# Patient Record
Sex: Female | Born: 1950
Health system: Southern US, Community
[De-identification: ages and names within clinical notes are randomized; demographics above are authoritative.]

## PROBLEM LIST (undated history)

## (undated) DIAGNOSIS — Z8659 Personal history of other mental and behavioral disorders: Secondary | ICD-10-CM

## (undated) DIAGNOSIS — F909 Attention-deficit hyperactivity disorder, unspecified type: Secondary | ICD-10-CM

## (undated) DIAGNOSIS — R7303 Prediabetes: Secondary | ICD-10-CM

## (undated) DIAGNOSIS — K219 Gastro-esophageal reflux disease without esophagitis: Secondary | ICD-10-CM

## (undated) DIAGNOSIS — N393 Stress incontinence (female) (male): Secondary | ICD-10-CM

## (undated) DIAGNOSIS — I10 Essential (primary) hypertension: Secondary | ICD-10-CM

## (undated) DIAGNOSIS — F419 Anxiety disorder, unspecified: Secondary | ICD-10-CM

## (undated) DIAGNOSIS — D649 Anemia, unspecified: Secondary | ICD-10-CM

## (undated) DIAGNOSIS — M1990 Unspecified osteoarthritis, unspecified site: Secondary | ICD-10-CM

## (undated) DIAGNOSIS — F329 Major depressive disorder, single episode, unspecified: Secondary | ICD-10-CM

## (undated) DIAGNOSIS — E890 Postprocedural hypothyroidism: Secondary | ICD-10-CM

## (undated) DIAGNOSIS — F32A Depression, unspecified: Secondary | ICD-10-CM

## (undated) DIAGNOSIS — Z973 Presence of spectacles and contact lenses: Secondary | ICD-10-CM

## (undated) DIAGNOSIS — E785 Hyperlipidemia, unspecified: Secondary | ICD-10-CM

## (undated) DIAGNOSIS — G4730 Sleep apnea, unspecified: Secondary | ICD-10-CM

## (undated) HISTORY — DX: Major depressive disorder, single episode, unspecified: F32.9

## (undated) HISTORY — DX: Essential (primary) hypertension: I10

## (undated) HISTORY — PX: ROTATOR CUFF REPAIR: SHX139

## (undated) HISTORY — DX: Attention-deficit hyperactivity disorder, unspecified type: F90.9

## (undated) HISTORY — PX: CATARACT EXTRACTION: SUR2

## (undated) HISTORY — PX: REPAIR SPIGELIAN HERNIA: SUR1208

## (undated) HISTORY — DX: Anxiety disorder, unspecified: F41.9

## (undated) HISTORY — PX: THYROIDECTOMY: SHX17

## (undated) HISTORY — DX: Depression, unspecified: F32.A

---

## 1973-08-16 HISTORY — PX: WRIST GANGLION EXCISION: SUR520

## 1999-09-24 ENCOUNTER — Encounter: Payer: Self-pay | Admitting: Family Medicine

## 1999-09-24 ENCOUNTER — Ambulatory Visit (HOSPITAL_COMMUNITY): Admission: RE | Admit: 1999-09-24 | Discharge: 1999-09-24 | Payer: Self-pay | Admitting: Family Medicine

## 1999-12-30 ENCOUNTER — Encounter: Admission: RE | Admit: 1999-12-30 | Discharge: 1999-12-30 | Payer: Self-pay | Admitting: Family Medicine

## 1999-12-30 ENCOUNTER — Encounter: Payer: Self-pay | Admitting: Family Medicine

## 2000-01-28 ENCOUNTER — Observation Stay (HOSPITAL_COMMUNITY): Admission: RE | Admit: 2000-01-28 | Discharge: 2000-01-29 | Payer: Self-pay | Admitting: General Surgery

## 2001-04-12 ENCOUNTER — Encounter: Admission: RE | Admit: 2001-04-12 | Discharge: 2001-04-12 | Payer: Self-pay | Admitting: Family Medicine

## 2001-04-12 ENCOUNTER — Encounter: Payer: Self-pay | Admitting: Family Medicine

## 2001-08-16 HISTORY — PX: VENTRAL HERNIA REPAIR: SHX424

## 2002-04-24 ENCOUNTER — Encounter: Payer: Self-pay | Admitting: Family Medicine

## 2002-04-24 ENCOUNTER — Encounter: Admission: RE | Admit: 2002-04-24 | Discharge: 2002-04-24 | Payer: Self-pay | Admitting: Family Medicine

## 2003-11-05 ENCOUNTER — Encounter: Admission: RE | Admit: 2003-11-05 | Discharge: 2003-11-05 | Payer: Self-pay | Admitting: Family Medicine

## 2004-11-09 ENCOUNTER — Encounter: Admission: RE | Admit: 2004-11-09 | Discharge: 2004-11-09 | Payer: Self-pay | Admitting: Family Medicine

## 2004-12-03 ENCOUNTER — Ambulatory Visit: Payer: Self-pay | Admitting: Cardiology

## 2004-12-15 ENCOUNTER — Ambulatory Visit: Payer: Self-pay | Admitting: Cardiology

## 2005-02-09 ENCOUNTER — Other Ambulatory Visit: Admission: RE | Admit: 2005-02-09 | Discharge: 2005-02-09 | Payer: Self-pay | Admitting: Family Medicine

## 2005-06-22 ENCOUNTER — Ambulatory Visit: Payer: Self-pay | Admitting: Sports Medicine

## 2005-06-29 ENCOUNTER — Ambulatory Visit: Payer: Self-pay | Admitting: Sports Medicine

## 2006-04-22 ENCOUNTER — Encounter: Admission: RE | Admit: 2006-04-22 | Discharge: 2006-04-22 | Payer: Self-pay | Admitting: Family Medicine

## 2006-05-31 ENCOUNTER — Other Ambulatory Visit: Admission: RE | Admit: 2006-05-31 | Discharge: 2006-05-31 | Payer: Self-pay | Admitting: Family Medicine

## 2006-10-21 ENCOUNTER — Ambulatory Visit (HOSPITAL_COMMUNITY): Admission: RE | Admit: 2006-10-21 | Discharge: 2006-10-21 | Payer: Self-pay | Admitting: Family Medicine

## 2007-04-20 ENCOUNTER — Inpatient Hospital Stay (HOSPITAL_COMMUNITY): Admission: RE | Admit: 2007-04-20 | Discharge: 2007-04-21 | Payer: Self-pay | Admitting: Orthopedic Surgery

## 2007-04-20 HISTORY — PX: OTHER SURGICAL HISTORY: SHX169

## 2008-09-13 ENCOUNTER — Encounter (INDEPENDENT_AMBULATORY_CARE_PROVIDER_SITE_OTHER): Payer: Self-pay | Admitting: Orthopedic Surgery

## 2008-09-13 ENCOUNTER — Ambulatory Visit (HOSPITAL_BASED_OUTPATIENT_CLINIC_OR_DEPARTMENT_OTHER): Admission: RE | Admit: 2008-09-13 | Discharge: 2008-09-13 | Payer: Self-pay | Admitting: Orthopedic Surgery

## 2008-09-13 HISTORY — PX: OTHER SURGICAL HISTORY: SHX169

## 2008-11-20 ENCOUNTER — Other Ambulatory Visit: Admission: RE | Admit: 2008-11-20 | Discharge: 2008-11-20 | Payer: Self-pay | Admitting: Family Medicine

## 2009-03-14 ENCOUNTER — Encounter: Admission: RE | Admit: 2009-03-14 | Discharge: 2009-03-14 | Payer: Self-pay | Admitting: Family Medicine

## 2009-03-18 ENCOUNTER — Ambulatory Visit: Payer: Self-pay | Admitting: Obstetrics and Gynecology

## 2009-03-28 ENCOUNTER — Ambulatory Visit: Payer: Self-pay | Admitting: Obstetrics and Gynecology

## 2009-04-09 ENCOUNTER — Ambulatory Visit: Payer: Self-pay | Admitting: Obstetrics and Gynecology

## 2009-04-15 ENCOUNTER — Ambulatory Visit: Payer: Self-pay | Admitting: Obstetrics and Gynecology

## 2009-04-15 ENCOUNTER — Ambulatory Visit (HOSPITAL_BASED_OUTPATIENT_CLINIC_OR_DEPARTMENT_OTHER): Admission: RE | Admit: 2009-04-15 | Discharge: 2009-04-15 | Payer: Self-pay | Admitting: Obstetrics and Gynecology

## 2009-04-15 ENCOUNTER — Encounter: Payer: Self-pay | Admitting: Obstetrics and Gynecology

## 2009-04-15 HISTORY — PX: OTHER SURGICAL HISTORY: SHX169

## 2009-05-01 ENCOUNTER — Ambulatory Visit: Payer: Self-pay | Admitting: Obstetrics and Gynecology

## 2009-05-15 ENCOUNTER — Ambulatory Visit: Payer: Self-pay | Admitting: Sports Medicine

## 2009-05-15 DIAGNOSIS — M170 Bilateral primary osteoarthritis of knee: Secondary | ICD-10-CM | POA: Insufficient documentation

## 2009-05-15 DIAGNOSIS — M23349 Other meniscus derangements, anterior horn of lateral meniscus, unspecified knee: Secondary | ICD-10-CM

## 2009-05-15 DIAGNOSIS — M25569 Pain in unspecified knee: Secondary | ICD-10-CM

## 2009-06-16 ENCOUNTER — Ambulatory Visit: Payer: Self-pay | Admitting: Sports Medicine

## 2010-04-15 ENCOUNTER — Encounter: Admission: RE | Admit: 2010-04-15 | Discharge: 2010-04-15 | Payer: Self-pay | Admitting: Family Medicine

## 2010-04-22 ENCOUNTER — Other Ambulatory Visit: Admission: RE | Admit: 2010-04-22 | Discharge: 2010-04-22 | Payer: Self-pay | Admitting: Obstetrics and Gynecology

## 2010-04-22 ENCOUNTER — Ambulatory Visit: Payer: Self-pay | Admitting: Obstetrics and Gynecology

## 2010-05-18 ENCOUNTER — Encounter: Admission: RE | Admit: 2010-05-18 | Discharge: 2010-05-18 | Payer: Self-pay | Admitting: Orthopedic Surgery

## 2010-10-13 ENCOUNTER — Ambulatory Visit (INDEPENDENT_AMBULATORY_CARE_PROVIDER_SITE_OTHER): Payer: BC Managed Care – PPO | Admitting: Obstetrics and Gynecology

## 2010-10-13 DIAGNOSIS — B373 Candidiasis of vulva and vagina: Secondary | ICD-10-CM

## 2010-10-13 DIAGNOSIS — N950 Postmenopausal bleeding: Secondary | ICD-10-CM

## 2010-10-20 ENCOUNTER — Ambulatory Visit (INDEPENDENT_AMBULATORY_CARE_PROVIDER_SITE_OTHER): Payer: BC Managed Care – PPO | Admitting: Obstetrics and Gynecology

## 2010-10-20 ENCOUNTER — Other Ambulatory Visit: Payer: Self-pay | Admitting: Obstetrics and Gynecology

## 2010-10-20 ENCOUNTER — Other Ambulatory Visit: Payer: BC Managed Care – PPO

## 2010-10-20 DIAGNOSIS — N950 Postmenopausal bleeding: Secondary | ICD-10-CM

## 2010-10-20 DIAGNOSIS — D259 Leiomyoma of uterus, unspecified: Secondary | ICD-10-CM

## 2010-11-21 LAB — POCT I-STAT 4, (NA,K, GLUC, HGB,HCT)
Glucose, Bld: 104 mg/dL — ABNORMAL HIGH (ref 70–99)
HCT: 45 % (ref 36.0–46.0)
Potassium: 3.5 mEq/L (ref 3.5–5.1)

## 2010-11-30 LAB — BASIC METABOLIC PANEL
BUN: 8 mg/dL (ref 6–23)
Creatinine, Ser: 0.76 mg/dL (ref 0.4–1.2)
GFR calc Af Amer: >60 mL/min (ref >60)
GFR calc non Af Amer: >60 mL/min (ref >60)
Glucose, Bld: 122 mg/dL — ABNORMAL HIGH (ref 70–99)

## 2010-11-30 LAB — POCT HEMOGLOBIN-HEMACUE: Hemoglobin: 13.9 g/dL (ref 12.0–15.0)

## 2010-12-02 ENCOUNTER — Encounter (HOSPITAL_COMMUNITY)
Admission: RE | Admit: 2010-12-02 | Discharge: 2010-12-02 | Disposition: A | Discharge status: Home / Self Care | Payer: BC Managed Care – PPO | Source: Ambulatory Visit | Attending: Obstetrics and Gynecology | Admitting: Obstetrics and Gynecology

## 2010-12-02 LAB — BASIC METABOLIC PANEL
BUN: 9 mg/dL (ref 6–23)
CO2: 30 mEq/L (ref 19–32)
Calcium: 9.4 mg/dL (ref 8.4–10.5)
Creatinine, Ser: 0.86 mg/dL (ref 0.4–1.2)
GFR calc Af Amer: >60 mL/min (ref >60)
GFR calc non Af Amer: >60 mL/min (ref >60)
Glucose, Bld: 87 mg/dL (ref 70–99)
Potassium: 4.4 mEq/L (ref 3.5–5.1)

## 2010-12-02 LAB — URINALYSIS, ROUTINE W REFLEX MICROSCOPIC
Ketones, ur: NEGATIVE mg/dL
Nitrite: NEGATIVE
Urobilinogen, UA: 0.2 mg/dL (ref 0.0–1.0)
pH: 5.5 (ref 5.0–8.0)

## 2010-12-02 LAB — URINE MICROSCOPIC-ADD ON

## 2010-12-02 LAB — CBC
MCH: 27.2 pg (ref 26.0–34.0)
MCV: 86.2 fL (ref 78.0–100.0)
Platelets: 372 10*3/uL (ref 150–400)
WBC: 7 10*3/uL (ref 4.0–10.5)

## 2010-12-09 ENCOUNTER — Ambulatory Visit (HOSPITAL_COMMUNITY)
Admission: AD | Admit: 2010-12-09 | Discharge: 2010-12-09 | Disposition: A | Discharge status: Home / Self Care | Payer: BC Managed Care – PPO | Source: Ambulatory Visit | Attending: Obstetrics and Gynecology | Admitting: Obstetrics and Gynecology

## 2010-12-09 ENCOUNTER — Other Ambulatory Visit: Payer: Self-pay | Admitting: Obstetrics and Gynecology

## 2010-12-09 DIAGNOSIS — Z01818 Encounter for other preprocedural examination: Secondary | ICD-10-CM | POA: Insufficient documentation

## 2010-12-09 DIAGNOSIS — N950 Postmenopausal bleeding: Secondary | ICD-10-CM

## 2010-12-09 DIAGNOSIS — N840 Polyp of corpus uteri: Secondary | ICD-10-CM | POA: Insufficient documentation

## 2010-12-09 DIAGNOSIS — Z01812 Encounter for preprocedural laboratory examination: Secondary | ICD-10-CM | POA: Insufficient documentation

## 2010-12-09 HISTORY — PX: OTHER SURGICAL HISTORY: SHX169

## 2010-12-09 NOTE — Op Note (Signed)
[name], [name]                 ACCOUNT NO.:  [id number]  MEDICAL RECORD NO.:  [id number]           PATIENT TYPE:  O  LOCATION:  [facility]                          FACILITY[facility]  [facility]  PHYSICIAN:  Harlem Thresher L. Elveta Rape, M.D.DATE OF BIRTH:  [birth year]  DATE OF PROCEDURE:  12/09/2010 DATE OF DISCHARGE:                              OPERATIVE REPORT   PREOPERATIVE DIAGNOSIS:  Postmenopausal bleeding, persistent.  PREOPERATIVE DIAGNOSIS:  Postmenopausal bleeding with endometrial polyp.  OPERATIONS:  Hysteroscopy with excision of endometrial polyp, ThermaChoice endometrial ablation.  SURGEON:  Danyeal Akens L. Eda Paschal, MD  ANESTHESIA:  General.  INDICATIONS:  The patient is a 60 year old gravida 3, para 2, AB 1, who had presented to the office with persistent postmenopausal bleeding.  In the office, a saline infusion histogram was done showing no evidence of pathology.  Endometrial biopsy was benign.  The patient is not on hormone replacement, but in spite of all the above, she is continuing to have episodes of bleeding.  She is now taken to the operating room for hysteroscopy to identify previously undiagnosed pathology followed by ThermaChoice endometrial ablation to resolve the bleeding.  FINDINGS:  External is normal.  BUS is normal.  Vagina is normal. Cervix is clean.  Uterus is normal size and shape without any uterine descensus.  Adnexa failed to reveal masses.  At the time of hysteroscopy, the patient had a mostly atrophic endometrium.  In her right lower uterine segment, there was sort of an atrophic looking endometrial polyp of only 4 to 5 mm which I suspect is why it was not seen on saline infusion histogram.  I was not convinced by looking at that it really explained the patient's persistent bleeding.  PROCEDURE:  After adequate general anesthesia, the patient was placed in dorsal supine position, prepped and draped in usual sterile manner.  A single-tooth tenaculum was placed in  the anterior lip of the cervix.  A paracervical block was given with 10 mL of 1% plain Xylocaine.  The cervix dilated easily to a #23 Pratt dilator.  It sounded to 8 cm.  A diagnostic hysteroscope was inserted.  D5W was used because of the fact we are going with ThermaChoice.  An excellent view of the entire intrauterine cavity as well as the endocervical cavity could be seen. The only pathology was that 1 small polypoid growth down in the right lower uterine segment.  Following hysteroscopy, the patient was curetted with a sharp curette with excision of the small little polyp.  It was sent to pathology for tissue diagnosis.  Following this, a ThermaChoice endometrial ablation was performed.  The balloon was primed to -150 mm pressure. The balloon was then inserted into the uterus 7 cm leaving a 1 cm gap, and 10 mL of fluid was placed in the balloon and pressure quickly stabilized at 190 mm. pressure and 8-minute ThermaChoice ablation was then performed.  The procedure was terminated. The balloon cooled, it was then removed.  She was re- hysteroscoped and excellent result have been obtained and the area where the polyp previously had been was completely resolved.  The procedure was therefore  terminated.  Fluid deficit was less than 100 mL.  Blood loss was minimal.  The patient tolerated the procedure well and left the operating room in satisfactory condition.     [name] [name] L. Eda Paschal, M.D.     [name] [name]  D:  12/09/2010  T:  12/09/2010  Job:  [id number]  Electronically Signed by Edyth Gunnels M.D. on 12/09/2010 03:38:29 PM

## 2010-12-17 ENCOUNTER — Ambulatory Visit (INDEPENDENT_AMBULATORY_CARE_PROVIDER_SITE_OTHER): Payer: BC Managed Care – PPO | Admitting: Obstetrics and Gynecology

## 2010-12-17 DIAGNOSIS — Z9889 Other specified postprocedural states: Secondary | ICD-10-CM

## 2010-12-29 NOTE — Op Note (Signed)
[name] [name], [name] [name]                 ACCOUNT NO.:  [id number]   MEDICAL RECORD NO.:  [id number]          PATIENT TYPE:  AMB   LOCATION:  [facility]                         FACILITY:  [facility]   PHYSICIAN:  Daniel L. Gottsegen, M.D.DATE OF BIRTH:  1951-01-20   DATE OF PROCEDURE:  04/15/2009  DATE OF DISCHARGE:                               OPERATIVE REPORT   PREOPERATIVE DIAGNOSES:  Dysfunctional uterine bleeding, endometrial  polyp.   POSTOPERATIVE DIAGNOSES:  Dysfunctional uterine bleeding, endometrial  polyp.   NAME OF OPERATION:  Hysteroscopy with excision of endometrial polyps.   SURGEON:  Daniel L. Eda Paschal, M.D.   ANESTHESIA:  General.   INDICATIONS:  The patient is a 60 year old gravida 3, para 2, AB 1, who  had presented to the office with persistent postmenopausal spotting  since January.  Previous FSH had been borderline elevated.  Saline  infusion hysterogram was done in the office which showed a clear  endometrial polyp.  Endometrial biopsy was benign.  She now enters the  hospital for hysteroscopy and excision of the above.   FINDINGS:  External is normal.  BUS is normal.  Vaginal is normal.  Cervix is clean.  Uterus is normal size and shape without descensus.  Adnexa fails to reveal masses.  At the time of hysteroscopy, the patient  had a well-developed endometrial polyp coming off the right fundal wall  of about 1.5 cm.  Other than that, the endometrium was exceedingly  atrophic. The endocervical canal was free of disease.   PROCEDURE:  After adequate general anesthesia, the patient was placed in  dorsal lithotomy position, prepped and draped in the usual sterile  manner.  A single-tooth tenaculum was placed in the anterior lip of  cervix.  The cervix dilated easily to #31 [facility] dilator.  A  hysteroscopic resectoscope was introduced.  3% sorbitol was used to  expand the intrauterine cavity and a camera was used for magnification.  A 90 degrees wire loop was attached  with appropriate Bovie settings.  When the uterus was entered the polyp was easily identified.  It was  excised in several pieces with a wire loop bleeding was controlled with  the cautery.  The endometrial lining was completely atrophic.  There was  nothing else that required biopsy.  She had already had a previous  benign endometrial biopsy in the office.  The procedure was terminated.  Blood loss was minimal.  Fluid deficit was about 50 mL.  The patient  tolerated procedure well and left the operating room in satisfactory  condition.      Daniel L. Eda Paschal, M.D.  Electronically Signed     DLG/MEDQ  D:  04/15/2009  T:  04/15/2009  Job:  [id number]

## 2010-12-29 NOTE — Op Note (Signed)
[name], [name]                 ACCOUNT NO.:  [id number]   MEDICAL RECORD NO.:  [id number]          PATIENT TYPE:  AMB   LOCATION:  [facility]                          FACILITY:  [facility]   PHYSICIAN:  Katy Fitch. Sypher, M.D. DATE OF BIRTH:  30-Oct-1950   DATE OF PROCEDURE:  09/13/2008  DATE OF DISCHARGE:                               OPERATIVE REPORT   PREOPERATIVE DIAGNOSIS:  Firm mass, dorsoradial aspect of right long  finger, proximal phalangeal segment.   POSTOPERATIVE DIAGNOSES:  A large giant cell tumor involving the  proximal interphalangeal, dorsoradial capsule, and the flexor  sheath/periosteum of proximal phalanx, right long finger.   OPERATION:  Resection of articular and subfascial giant cell tumor,  right long finger.   OPERATING SURGEON:  Katy Fitch. Sypher, MD   ASSISTANT:  Marveen Reeks Dasnoit, PA-C   ANESTHESIA:  General by LMA supplemented by a 2% lidocaine digital  block.   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   This was firm.  This had the peeling of a possible giant cell tumor,  schwannoma, or perhaps a very tense ganglion.   Plain x-rays were unrevealing.   We advised excisional biopsy for diagnosis and hopeful resolution of  this predicament.   After informed consent, she is brought to the operating room at this  time.   PROCEDURE:  [name] was brought to the operating room and placed in  supine position upon the operating table.   Following induction of general anesthesia under Dr. Marlane Mingle direct  supervision, the right arm was prepped with Betadine soap solution and  sterilely draped.  A pneumatic tourniquet was applied to the proximal  forearm.  Tourniquet was set at 230 mmHg.   The arm was prepped with Betadine soap  solution and sterilely draped.  After exsanguination of the hand and forearm with an Esmarch bandage,  arterial tourniquet was inflated to 230 mmHg.  The procedure commenced  with a longitudinal incision directly over the mass.  Subcutaneous  tissues were carefully divided taking care to gently retract the  dorsoradial sensory branch.  This was noted to be a giant cell tumor  upon release of the subcutaneous tissues.  This was involving the  dorsoradial capsule, the PIP joint involved the radial collateral  ligament origin at the proximal phalangeal head, and extended into the  flexor sheath and retinaculum.  This was carefully peeled off the A2 and  C1 pulleys off the radial collateral ligament.  The periosteum of the  proximal phalanx was excised with the mass and the capsule of the PIP  joint was entered, and the mass clearly debrided from the capsule  margins.  This did not appear to go deep to the collateral ligament or  into the flexor sheath.   The bed was electrocauterized with bipolar forceps in an effort to  obtain a limited margin.  The wound was then inspected for bleeding  points and repaired with intradermal 3-0 Prolene and Steri-Strips.   A 2% lidocaine digital block was placed for postoperative analgesia.  There were no apparent complications.      Katy Fitch Sypher, M.D.  Electronically Signed     RVS/MEDQ  D:  09/13/2008  T:  09/13/2008  Job:  [id number]   cc:   Molly Maduro A. Nicholos Johns, M.D.

## 2010-12-29 NOTE — Op Note (Signed)
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   PROCEDURE:  Triple arthrodesis on the left.   SURGEON:  Nadara Mustard, MD   ANESTHESIA:  General plus popliteal block.   ESTIMATED BLOOD LOSS:  Minimal.   ANTIBIOTICS:  Clindamycin 600 mg IV.   DRAINS:  None.   COMPLICATIONS:  None.   TOURNIQUET TIME:  Esmarch ankle for approximately 40 minutes.   DISPOSITION:  To PACU in stable condition.   INDICATIONS FOR PROCEDURE:  The patient is a 60 year old woman with a  fixed pronated valgus hind foot with posterior tibial tendon  insufficiency of the left.  The patient has failed conservative care and  presents at this time for triple arthrodesis.  Risks and benefits were  discussed including infection, neurovascular injury, persistent pain,  need for additional surgery.  The patient states she understands and  wished proceed at this time.   DESCRIPTION OF PROCEDURE:  The patient brought to OR room 1, and  underwent a general anesthetic.  After adequate level of anesthesia  obtained, the patient's left lower extremity was prepped using DuraPrep,  draped into a sterile field.  [name] was used to cover all exposed skin.  An oblique incision was made across the sinus tarsi and Esmarch was  placed around the ankle for tourniquet control.  The extensor muscles  were elevated and retracted distally.  The posterior facet and the  calcaneocuboid joint were debrided  of articular cartilage.  The subtalar  joint was also debrided of articular cartilage.  After debridement, the  wound was irrigated with normal saline and attention was focused on the  talonavicular joint.  A incision was made dorsally medial to the  anterior tibial tendon.  This was carried down to the talonavicular  joint which was debrided of articular cartilage.  The wound was cleansed  and irrigated.  The patient's foot was then held in neutral varus valgus  tilt with the foot supinated, the ankle at 90 degrees and a guidewire  was inserted from the talar neck into the calcaneus.  This was then  overdrilled, this measured for a 70 mm screw and a 70 mm partially  threaded 7.3 screw was inserted.  The patient's calcaneocuboid joint was  then fused with a screw across the calcaneocuboid joint with the foot in  neutral varus and valgus, dorsiflexion 90 degrees and the foot  supinated.  After stabilization of the calcaneocuboid joint, the  talonavicular joint was also stabilized.  C-arm fluoroscopy verified  reduction in both AP and lateral  planes.  A 3.5 cortical screw was used  across the talonavicular and the calcaneocuboid joints.  The wounds were  again irrigated with normal saline.  The Esmarch was released and  hemostasis was obtained.  The Vitoss bone graft was then placed in the  subtalar joint, 5 mL.  The muscle fascia was then reapproximated using 2-  0 Vicryl.  Skin was closed using modified vertical mattress suture with  2-0 nylon.  There was no tension on the skin.  The wounds were covered  with Adaptic orthopedic sponges, Webril and Coban dressing.   TOTAL TOURNIQUET TIME:  40 minutes.   She was extubated, taken to PACU in stable condition.  Plan for  discharge to home once safe with ambulation.      Nadara Mustard, MD  Electronically Signed     MVD/MEDQ  D:  04/20/2007  T:  04/20/2007  Job:  [id number]

## 2011-02-10 ENCOUNTER — Emergency Department (HOSPITAL_COMMUNITY)
Admission: EM | Admit: 2011-02-10 | Discharge: 2011-02-10 | Disposition: A | Discharge status: Home / Self Care | Payer: BC Managed Care – PPO | Attending: Emergency Medicine | Admitting: Emergency Medicine

## 2011-02-10 DIAGNOSIS — R197 Diarrhea, unspecified: Secondary | ICD-10-CM | POA: Insufficient documentation

## 2011-02-10 DIAGNOSIS — R079 Chest pain, unspecified: Secondary | ICD-10-CM | POA: Insufficient documentation

## 2011-02-10 DIAGNOSIS — K219 Gastro-esophageal reflux disease without esophagitis: Secondary | ICD-10-CM | POA: Insufficient documentation

## 2011-02-10 DIAGNOSIS — I10 Essential (primary) hypertension: Secondary | ICD-10-CM | POA: Insufficient documentation

## 2011-02-10 DIAGNOSIS — R112 Nausea with vomiting, unspecified: Secondary | ICD-10-CM | POA: Insufficient documentation

## 2011-02-10 DIAGNOSIS — F411 Generalized anxiety disorder: Secondary | ICD-10-CM | POA: Insufficient documentation

## 2011-02-10 DIAGNOSIS — R109 Unspecified abdominal pain: Secondary | ICD-10-CM | POA: Insufficient documentation

## 2011-02-10 DIAGNOSIS — E039 Hypothyroidism, unspecified: Secondary | ICD-10-CM | POA: Insufficient documentation

## 2011-02-10 LAB — CBC
HCT: 40 % (ref 36.0–46.0)
Hemoglobin: 12.8 g/dL (ref 12.0–15.0)
WBC: 10.7 10*3/uL — ABNORMAL HIGH (ref 4.0–10.5)

## 2011-02-10 LAB — COMPREHENSIVE METABOLIC PANEL
AST: 25 U/L (ref 0–37)
Calcium: 8.8 mg/dL (ref 8.4–10.5)
Creatinine, Ser: 0.66 mg/dL (ref 0.50–1.10)
GFR calc non Af Amer: >60 mL/min (ref >60)
Glucose, Bld: 110 mg/dL — ABNORMAL HIGH (ref 70–99)

## 2011-02-10 LAB — DIFFERENTIAL
Basophils Absolute: 0 10*3/uL (ref 0.0–0.1)
Eosinophils Absolute: 0 10*3/uL (ref 0.0–0.7)
Lymphs Abs: 0.5 10*3/uL — ABNORMAL LOW (ref 0.7–4.0)
Monocytes Relative: 5 % (ref 3–12)
Neutro Abs: 9.6 10*3/uL — ABNORMAL HIGH (ref 1.7–7.7)
Neutrophils Relative %: 90 % — ABNORMAL HIGH (ref 43–77)

## 2011-02-10 LAB — CK TOTAL AND CKMB (NOT AT ARMC)
CK, MB: 2 ng/mL (ref 0.3–4.0)
Total CK: 83 U/L (ref 7–177)

## 2011-05-11 ENCOUNTER — Other Ambulatory Visit: Payer: Self-pay | Admitting: Family Medicine

## 2011-05-11 DIAGNOSIS — Z1231 Encounter for screening mammogram for malignant neoplasm of breast: Secondary | ICD-10-CM

## 2011-05-28 LAB — CBC
Hemoglobin: 13.4
MCHC: 33.7
MCV: 80.9
RDW: 15.3 — ABNORMAL HIGH

## 2011-05-28 LAB — BASIC METABOLIC PANEL
CO2: 27
Calcium: 9.7
Chloride: 101
Creatinine, Ser: 0.74
Glucose, Bld: 97

## 2011-06-07 ENCOUNTER — Ambulatory Visit
Admission: RE | Admit: 2011-06-07 | Discharge: 2011-06-07 | Disposition: A | Discharge status: Home / Self Care | Payer: BC Managed Care – PPO | Source: Ambulatory Visit | Attending: Family Medicine | Admitting: Family Medicine

## 2011-06-07 DIAGNOSIS — Z1231 Encounter for screening mammogram for malignant neoplasm of breast: Secondary | ICD-10-CM

## 2011-06-09 ENCOUNTER — Encounter: Payer: Self-pay | Admitting: Gynecology

## 2011-06-09 DIAGNOSIS — N840 Polyp of corpus uteri: Secondary | ICD-10-CM | POA: Insufficient documentation

## 2011-06-17 ENCOUNTER — Encounter: Payer: Self-pay | Admitting: Obstetrics and Gynecology

## 2011-06-17 ENCOUNTER — Ambulatory Visit (INDEPENDENT_AMBULATORY_CARE_PROVIDER_SITE_OTHER): Payer: BC Managed Care – PPO | Admitting: Obstetrics and Gynecology

## 2011-06-17 ENCOUNTER — Other Ambulatory Visit (HOSPITAL_COMMUNITY)
Admission: RE | Admit: 2011-06-17 | Discharge: 2011-06-17 | Disposition: A | Discharge status: Home / Self Care | Payer: BC Managed Care – PPO | Source: Ambulatory Visit | Attending: Obstetrics and Gynecology | Admitting: Obstetrics and Gynecology

## 2011-06-17 VITALS — BP 140/84 | Ht 65.0 in | Wt 266.0 lb

## 2011-06-17 DIAGNOSIS — N393 Stress incontinence (female) (male): Secondary | ICD-10-CM

## 2011-06-17 DIAGNOSIS — Z01419 Encounter for gynecological examination (general) (routine) without abnormal findings: Secondary | ICD-10-CM | POA: Insufficient documentation

## 2011-06-17 DIAGNOSIS — R32 Unspecified urinary incontinence: Secondary | ICD-10-CM | POA: Insufficient documentation

## 2011-06-17 DIAGNOSIS — Z8262 Family history of osteoporosis: Secondary | ICD-10-CM

## 2011-06-17 DIAGNOSIS — E7800 Pure hypercholesterolemia, unspecified: Secondary | ICD-10-CM | POA: Insufficient documentation

## 2011-06-17 NOTE — Progress Notes (Signed)
Patient came to see me today for her annual GYN exam. Since we removed her endometrial polyp she's had no postmenopausal bleeding. She is doing fine without hormone replacement. She is up-to-date on mammograms. She's never had a bone density and it sounds like her grandmother had a osteoporotic fracture. She is having no pelvic pain. Patient is doing Kegel exercises for her USI with good results.  ROS: 12 systems reviewe done. Patient has an elevated cholesterol, GERD, hypertension, adult onset ADD.  Physical examination: Kennon Portela present HEENT within normal limits. Neck: Thyroid not large. No masses. Supraclavicular nodes: not enlarged. Breasts: Examined in both sitting midline position. No skin changes and no masses. Abdomen: Soft no guarding rebound or masses or hernia. Pelvic: External: Within normal limits. BUS: Within normal limits. Vaginal:within normal limits. Good estrogen effect. No evidence of cystocele rectocele or enterocele. Cervix: clean. Uterus: Normal size and shape. Adnexa: No masses. Rectovaginal exam: Confirmatory and negative. Extremities: Within normal limits.  Assessment: Urinary stress incontinence  Plan: Continue yearly mammograms. Bone density scheduled.

## 2011-06-24 ENCOUNTER — Ambulatory Visit (INDEPENDENT_AMBULATORY_CARE_PROVIDER_SITE_OTHER): Payer: BC Managed Care – PPO

## 2011-06-24 DIAGNOSIS — Z8262 Family history of osteoporosis: Secondary | ICD-10-CM

## 2011-06-24 DIAGNOSIS — Z13820 Encounter for screening for osteoporosis: Secondary | ICD-10-CM

## 2013-05-02 ENCOUNTER — Other Ambulatory Visit: Payer: Self-pay

## 2013-05-02 DIAGNOSIS — Z1231 Encounter for screening mammogram for malignant neoplasm of breast: Secondary | ICD-10-CM

## 2013-05-18 ENCOUNTER — Ambulatory Visit
Admission: RE | Admit: 2013-05-18 | Discharge: 2013-05-18 | Disposition: A | Discharge status: Home / Self Care | Payer: Self-pay | Source: Ambulatory Visit

## 2013-05-18 DIAGNOSIS — Z1231 Encounter for screening mammogram for malignant neoplasm of breast: Secondary | ICD-10-CM

## 2013-10-22 ENCOUNTER — Other Ambulatory Visit: Payer: Self-pay | Admitting: Family Medicine

## 2013-10-22 ENCOUNTER — Other Ambulatory Visit (HOSPITAL_COMMUNITY)
Admission: RE | Admit: 2013-10-22 | Discharge: 2013-10-22 | Disposition: A | Discharge status: Home / Self Care | Payer: BC Managed Care – PPO | Source: Ambulatory Visit | Attending: Family Medicine | Admitting: Family Medicine

## 2013-10-22 DIAGNOSIS — Z0000 Encounter for general adult medical examination without abnormal findings: Secondary | ICD-10-CM | POA: Insufficient documentation

## 2014-06-17 ENCOUNTER — Encounter: Payer: Self-pay | Admitting: Obstetrics and Gynecology

## 2014-07-05 ENCOUNTER — Other Ambulatory Visit: Payer: Self-pay

## 2014-07-05 DIAGNOSIS — Z1231 Encounter for screening mammogram for malignant neoplasm of breast: Secondary | ICD-10-CM

## 2014-08-02 ENCOUNTER — Ambulatory Visit
Admission: RE | Admit: 2014-08-02 | Discharge: 2014-08-02 | Disposition: A | Discharge status: Home / Self Care | Payer: BC Managed Care – PPO | Source: Ambulatory Visit

## 2014-08-02 DIAGNOSIS — Z1231 Encounter for screening mammogram for malignant neoplasm of breast: Secondary | ICD-10-CM

## 2014-08-06 ENCOUNTER — Other Ambulatory Visit: Payer: Self-pay | Admitting: Family Medicine

## 2014-08-06 DIAGNOSIS — R928 Other abnormal and inconclusive findings on diagnostic imaging of breast: Secondary | ICD-10-CM

## 2014-08-14 ENCOUNTER — Ambulatory Visit
Admission: RE | Admit: 2014-08-14 | Discharge: 2014-08-14 | Disposition: A | Discharge status: Home / Self Care | Payer: BC Managed Care – PPO | Source: Ambulatory Visit | Attending: Family Medicine | Admitting: Family Medicine

## 2014-08-14 DIAGNOSIS — R928 Other abnormal and inconclusive findings on diagnostic imaging of breast: Secondary | ICD-10-CM

## 2015-02-19 ENCOUNTER — Emergency Department (HOSPITAL_COMMUNITY)
Admission: EM | Admit: 2015-02-19 | Discharge: 2015-02-19 | Disposition: A | Discharge status: Home / Self Care | Payer: BLUE CROSS/BLUE SHIELD | Attending: Emergency Medicine | Admitting: Emergency Medicine

## 2015-02-19 ENCOUNTER — Emergency Department (HOSPITAL_COMMUNITY): Payer: BLUE CROSS/BLUE SHIELD

## 2015-02-19 ENCOUNTER — Encounter (HOSPITAL_COMMUNITY): Payer: Self-pay | Admitting: Emergency Medicine

## 2015-02-19 DIAGNOSIS — Y998 Other external cause status: Secondary | ICD-10-CM | POA: Insufficient documentation

## 2015-02-19 DIAGNOSIS — Y92410 Unspecified street and highway as the place of occurrence of the external cause: Secondary | ICD-10-CM | POA: Diagnosis not present

## 2015-02-19 DIAGNOSIS — F329 Major depressive disorder, single episode, unspecified: Secondary | ICD-10-CM | POA: Diagnosis not present

## 2015-02-19 DIAGNOSIS — K219 Gastro-esophageal reflux disease without esophagitis: Secondary | ICD-10-CM | POA: Insufficient documentation

## 2015-02-19 DIAGNOSIS — I10 Essential (primary) hypertension: Secondary | ICD-10-CM | POA: Diagnosis not present

## 2015-02-19 DIAGNOSIS — S8002XA Contusion of left knee, initial encounter: Secondary | ICD-10-CM | POA: Diagnosis not present

## 2015-02-19 DIAGNOSIS — S8992XA Unspecified injury of left lower leg, initial encounter: Secondary | ICD-10-CM | POA: Diagnosis present

## 2015-02-19 DIAGNOSIS — F419 Anxiety disorder, unspecified: Secondary | ICD-10-CM | POA: Diagnosis not present

## 2015-02-19 DIAGNOSIS — E079 Disorder of thyroid, unspecified: Secondary | ICD-10-CM | POA: Insufficient documentation

## 2015-02-19 DIAGNOSIS — Z87891 Personal history of nicotine dependence: Secondary | ICD-10-CM | POA: Insufficient documentation

## 2015-02-19 DIAGNOSIS — S239XXA Sprain of unspecified parts of thorax, initial encounter: Secondary | ICD-10-CM | POA: Diagnosis not present

## 2015-02-19 DIAGNOSIS — E669 Obesity, unspecified: Secondary | ICD-10-CM | POA: Diagnosis not present

## 2015-02-19 DIAGNOSIS — E780 Pure hypercholesterolemia: Secondary | ICD-10-CM | POA: Diagnosis not present

## 2015-02-19 DIAGNOSIS — Z8742 Personal history of other diseases of the female genital tract: Secondary | ICD-10-CM | POA: Diagnosis not present

## 2015-02-19 DIAGNOSIS — F909 Attention-deficit hyperactivity disorder, unspecified type: Secondary | ICD-10-CM | POA: Diagnosis not present

## 2015-02-19 DIAGNOSIS — Y9389 Activity, other specified: Secondary | ICD-10-CM | POA: Diagnosis not present

## 2015-02-19 DIAGNOSIS — Z7982 Long term (current) use of aspirin: Secondary | ICD-10-CM | POA: Diagnosis not present

## 2015-02-19 MED ORDER — OXYCODONE-ACETAMINOPHEN 5-325 MG PO TABS
1.0000 | ORAL_TABLET | Freq: Once | ORAL | Status: AC
  Administered 2015-02-19: 1 via ORAL
  Filled 2015-02-19: qty 1
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
## 2015-02-19 NOTE — Discharge Instructions (Signed)

## 2015-11-20 ENCOUNTER — Other Ambulatory Visit: Payer: Self-pay

## 2015-11-20 DIAGNOSIS — Z1231 Encounter for screening mammogram for malignant neoplasm of breast: Secondary | ICD-10-CM

## 2015-12-22 ENCOUNTER — Ambulatory Visit
Admission: RE | Admit: 2015-12-22 | Discharge: 2015-12-22 | Disposition: A | Discharge status: Home / Self Care | Payer: BLUE CROSS/BLUE SHIELD | Source: Ambulatory Visit

## 2015-12-22 DIAGNOSIS — Z1231 Encounter for screening mammogram for malignant neoplasm of breast: Secondary | ICD-10-CM

## 2016-07-05 ENCOUNTER — Other Ambulatory Visit: Payer: Self-pay | Admitting: Urology

## 2016-07-15 ENCOUNTER — Encounter: Payer: Self-pay | Admitting: Sports Medicine

## 2016-07-15 ENCOUNTER — Ambulatory Visit (INDEPENDENT_AMBULATORY_CARE_PROVIDER_SITE_OTHER): Payer: BLUE CROSS/BLUE SHIELD | Admitting: Sports Medicine

## 2016-07-15 DIAGNOSIS — S76919A Strain of unspecified muscles, fascia and tendons at thigh level, unspecified thigh, initial encounter: Secondary | ICD-10-CM | POA: Diagnosis not present

## 2016-07-15 MED ORDER — TRAMADOL HCL 50 MG PO TABS: 50.0000 mg | ORAL_TABLET | Freq: Four times a day (QID) | ORAL | 2 refills | Status: DC | PRN

## 2016-07-15 NOTE — Assessment & Plan Note (Signed)
She will start with gentle HEP  She had SU surgery coming soon so we will plan using tramadol for pain as she should avoid NSAIDS  After surgery is complete I think she would benefit from PT evaluation Needs to get better strength and balance to prevent future injury

## 2016-07-15 NOTE — Patient Instructions (Signed)
This is likely ilio-psoas strain  You have to do some easy exercises to get the muscle working but not strain with these Keep pain level to 3/10 or less  Forward swings Cross swings Circles Hip flexion  Do a few times daily Until strength is feeling better  Since you have surgery coming let's try tramadol for pain and see if that works Take 1 two to four times per day as needed for pain  Robaxin is OK but you may not need it

## 2016-07-15 NOTE — Progress Notes (Signed)
  [name] [name] - 65 y.o. female MRN [id number]  Date of birth: 1951/07/20  SUBJECTIVE:  Including CC & ROS.  CC: Right groin pain   [name] [name] is a 65yo female with PMH of anxiety, depression, ADHD, OA of bilateral knees presenting today with right groin pain that has been ongoing for the past 5 weeks but worse over the past 1 week. Pt mentions she was at the beach and went from seated position on the ground with legs open to standing resulting in right groin muscle spasm 5 weeks ago. The pain remained constant, but 1 week ago she was getting out of bathtub and felt muscle spasm in right groin that radiated to her right lateral hip area. Since then she has tried ibuprofen 4x/night with relief and a muscle relaxant with minor relief. She has used ice with some relief. The pain is aggravated by flexion at her hip joint and lifting her right thigh. She mentions that she has pain when getting into a car and while getting into bed.    ROS: No weakness No gait problems No numbness No edema No erythema   HISTORY: Past Medical, Surgical, Social, and Family History Reviewed & Updated per EMR.   Pertinent Historical Findings include: PMSHx - anxiety, depression, ADHD, OA of bilateral knees PSHx - Denies current tobacco use.  Lives with husband.  Medications - ibuprofen, duloxetine, wellbutrin, valsartan, vyvanse, simvistatin, asa   PHYSICAL EXAM:  VS: BP:(!) 133/103  HR:97bpm  TEMP: ( )  RESP:   HT:5\' 5"  (165.1 cm)   WT:275 lb (124.7 kg)  BMI:45.9 PHYSICAL EXAM: Gen: NAD, alert, cooperative with exam, well-appearing Neuro: No focal deficits  MSK: no redness overf right hip. No tenderness to palpation of right hip. Mild tenderness to palpation over upper thigh at site of illiopsoas muscle. ROM nl with extension, internal and external rotation of bilateral hips. Flexion at right hip limited by pain at 60 degrees. Strength nl with hip abduction, adduction, flexion and extension on left and  limited in flexion on RT by pain.  Able to walk with minimal limp     ASSESSMENT & PLAN:  Iliopsoas strain:  -Recommend home exercises of forward swings, cross swings, circles and hip flexion -Prescribed tramadol as needed for pain. Discussed interaction with duloxetine and patient is aware and understands potential risks.  -Can still take robaxin as needed.  -Follow up within next 6 weeks

## 2016-07-22 ENCOUNTER — Encounter (HOSPITAL_BASED_OUTPATIENT_CLINIC_OR_DEPARTMENT_OTHER): Payer: Self-pay | Admitting: *Deleted

## 2016-07-22 NOTE — Progress Notes (Signed)
NPO AFTER MN.  ARRIVE AT 0730.  NEEDS ISTAT AND EKG.  WILL TAKE AM DOS W/ SIPS OF WATER.

## 2016-07-26 ENCOUNTER — Encounter (HOSPITAL_BASED_OUTPATIENT_CLINIC_OR_DEPARTMENT_OTHER): Payer: Self-pay | Admitting: *Deleted

## 2016-07-26 ENCOUNTER — Ambulatory Visit (HOSPITAL_BASED_OUTPATIENT_CLINIC_OR_DEPARTMENT_OTHER)
Admission: RE | Admit: 2016-07-26 | Discharge: 2016-07-26 | Disposition: A | Discharge status: Home / Self Care | Payer: BLUE CROSS/BLUE SHIELD | Source: Ambulatory Visit | Attending: Urology | Admitting: Urology

## 2016-07-26 ENCOUNTER — Encounter (HOSPITAL_BASED_OUTPATIENT_CLINIC_OR_DEPARTMENT_OTHER)
Admission: RE | Disposition: A | Discharge status: Home / Self Care | Payer: Self-pay | Source: Ambulatory Visit | Attending: Urology

## 2016-07-26 ENCOUNTER — Ambulatory Visit (HOSPITAL_BASED_OUTPATIENT_CLINIC_OR_DEPARTMENT_OTHER): Payer: BLUE CROSS/BLUE SHIELD | Admitting: Anesthesiology

## 2016-07-26 DIAGNOSIS — Z6841 Body Mass Index (BMI) 40.0 and over, adult: Secondary | ICD-10-CM | POA: Insufficient documentation

## 2016-07-26 DIAGNOSIS — N3946 Mixed incontinence: Secondary | ICD-10-CM | POA: Diagnosis not present

## 2016-07-26 DIAGNOSIS — E7800 Pure hypercholesterolemia, unspecified: Secondary | ICD-10-CM | POA: Insufficient documentation

## 2016-07-26 DIAGNOSIS — I10 Essential (primary) hypertension: Secondary | ICD-10-CM | POA: Insufficient documentation

## 2016-07-26 DIAGNOSIS — K219 Gastro-esophageal reflux disease without esophagitis: Secondary | ICD-10-CM | POA: Diagnosis not present

## 2016-07-26 DIAGNOSIS — M1990 Unspecified osteoarthritis, unspecified site: Secondary | ICD-10-CM | POA: Diagnosis not present

## 2016-07-26 DIAGNOSIS — F329 Major depressive disorder, single episode, unspecified: Secondary | ICD-10-CM | POA: Insufficient documentation

## 2016-07-26 DIAGNOSIS — F419 Anxiety disorder, unspecified: Secondary | ICD-10-CM | POA: Diagnosis not present

## 2016-07-26 DIAGNOSIS — E039 Hypothyroidism, unspecified: Secondary | ICD-10-CM | POA: Insufficient documentation

## 2016-07-26 DIAGNOSIS — N393 Stress incontinence (female) (male): Secondary | ICD-10-CM

## 2016-07-26 DIAGNOSIS — Z87891 Personal history of nicotine dependence: Secondary | ICD-10-CM | POA: Diagnosis not present

## 2016-07-26 DIAGNOSIS — Z888 Allergy status to other drugs, medicaments and biological substances status: Secondary | ICD-10-CM | POA: Diagnosis not present

## 2016-07-26 DIAGNOSIS — Z7982 Long term (current) use of aspirin: Secondary | ICD-10-CM | POA: Diagnosis not present

## 2016-07-26 DIAGNOSIS — Z882 Allergy status to sulfonamides status: Secondary | ICD-10-CM | POA: Diagnosis not present

## 2016-07-26 HISTORY — PX: PUBOVAGINAL SLING: SHX1035

## 2016-07-26 HISTORY — DX: Hyperlipidemia, unspecified: E78.5

## 2016-07-26 HISTORY — DX: Postprocedural hypothyroidism: E89.0

## 2016-07-26 HISTORY — DX: Personal history of other mental and behavioral disorders: Z86.59

## 2016-07-26 HISTORY — DX: Gastro-esophageal reflux disease without esophagitis: K21.9

## 2016-07-26 HISTORY — DX: Unspecified osteoarthritis, unspecified site: M19.90

## 2016-07-26 HISTORY — DX: Presence of spectacles and contact lenses: Z97.3

## 2016-07-26 HISTORY — DX: Stress incontinence (female) (male): N39.3

## 2016-07-26 LAB — POCT I-STAT 4, (NA,K, GLUC, HGB,HCT)
Glucose, Bld: 106 mg/dL — ABNORMAL HIGH (ref 65–99)
HCT: 38 % (ref 36.0–46.0)
HEMOGLOBIN: 12.9 g/dL (ref 12.0–15.0)
Potassium: 3.8 mmol/L (ref 3.5–5.1)
SODIUM: 141 mmol/L (ref 135–145)

## 2016-07-26 SURGERY — CREATION, PUBOVAGINAL SLING
Anesthesia: General | Site: Vagina

## 2016-07-26 MED ORDER — OXYCODONE-ACETAMINOPHEN 5-325 MG PO TABS: 1.0000 | ORAL_TABLET | Freq: Four times a day (QID) | ORAL | 0 refills | Status: DC | PRN

## 2016-07-26 MED ORDER — LACTATED RINGERS IV SOLN
INTRAVENOUS | Status: DC
  Administered 2016-07-26 (×2): via INTRAVENOUS
  Filled 2016-07-26: qty 1000

## 2016-07-26 MED ORDER — LIDOCAINE 2% (20 MG/ML) 5 ML SYRINGE
INTRAMUSCULAR | Status: AC
  Filled 2016-07-26: qty 5

## 2016-07-26 MED ORDER — ACETAMINOPHEN 500 MG PO TABS
ORAL_TABLET | ORAL | Status: AC
  Filled 2016-07-26: qty 1

## 2016-07-26 MED ORDER — ONDANSETRON HCL 4 MG/2ML IJ SOLN
INTRAMUSCULAR | Status: AC
Start: 2016-07-26 — End: 2016-07-26
  Filled 2016-07-26: qty 2

## 2016-07-26 MED ORDER — MIDAZOLAM HCL 2 MG/2ML IJ SOLN
INTRAMUSCULAR | Status: AC
  Filled 2016-07-26: qty 2

## 2016-07-26 MED ORDER — MIDAZOLAM HCL 5 MG/5ML IJ SOLN
INTRAMUSCULAR | Status: DC | PRN
  Administered 2016-07-26: 2 mg via INTRAVENOUS

## 2016-07-26 MED ORDER — FLUORESCEIN SODIUM 10 % IV SOLN
INTRAVENOUS | Status: DC | PRN
  Administered 2016-07-26: 50 mg via INTRAVENOUS

## 2016-07-26 MED ORDER — PROMETHAZINE HCL 25 MG/ML IJ SOLN
6.2500 mg | INTRAMUSCULAR | Status: DC | PRN
  Filled 2016-07-26: qty 1

## 2016-07-26 MED ORDER — KETOROLAC TROMETHAMINE 30 MG/ML IJ SOLN
INTRAMUSCULAR | Status: AC
  Filled 2016-07-26: qty 1

## 2016-07-26 MED ORDER — LIDOCAINE HCL (PF) 0.5 % IJ SOLN
INTRAMUSCULAR | Status: DC | PRN
  Administered 2016-07-26 (×2): 10 mL

## 2016-07-26 MED ORDER — CIPROFLOXACIN IN D5W 400 MG/200ML IV SOLN
INTRAVENOUS | Status: AC
  Filled 2016-07-26: qty 200

## 2016-07-26 MED ORDER — FENTANYL CITRATE (PF) 100 MCG/2ML IJ SOLN
INTRAMUSCULAR | Status: DC | PRN
Start: 2016-07-26 — End: 2016-07-26
  Administered 2016-07-26 (×2): 50 ug via INTRAVENOUS

## 2016-07-26 MED ORDER — LIDOCAINE 2% (20 MG/ML) 5 ML SYRINGE
INTRAMUSCULAR | Status: DC | PRN
  Administered 2016-07-26: 100 mg via INTRAVENOUS

## 2016-07-26 MED ORDER — CEFAZOLIN SODIUM-DEXTROSE 2-4 GM/100ML-% IV SOLN
2.0000 g | INTRAVENOUS | Status: DC
  Filled 2016-07-26: qty 100

## 2016-07-26 MED ORDER — STERILE WATER FOR IRRIGATION IR SOLN
Status: DC | PRN
  Administered 2016-07-26: 3000 mL

## 2016-07-26 MED ORDER — PHENYLEPHRINE 40 MCG/ML (10ML) SYRINGE FOR IV PUSH (FOR BLOOD PRESSURE SUPPORT)
PREFILLED_SYRINGE | INTRAVENOUS | Status: DC | PRN
  Administered 2016-07-26 (×5): 80 ug via INTRAVENOUS

## 2016-07-26 MED ORDER — KETOROLAC TROMETHAMINE 30 MG/ML IJ SOLN
30.0000 mg | INTRAMUSCULAR | Status: DC
  Administered 2016-07-26: 30 mg via INTRAVENOUS
  Filled 2016-07-26: qty 1

## 2016-07-26 MED ORDER — PHENYLEPHRINE 40 MCG/ML (10ML) SYRINGE FOR IV PUSH (FOR BLOOD PRESSURE SUPPORT)
PREFILLED_SYRINGE | INTRAVENOUS | Status: AC
Start: 2016-07-26 — End: 2016-07-26
  Filled 2016-07-26: qty 10

## 2016-07-26 MED ORDER — PROPOFOL 10 MG/ML IV BOLUS
INTRAVENOUS | Status: DC | PRN
  Administered 2016-07-26: 100 mg via INTRAVENOUS
  Administered 2016-07-26: 200 mg via INTRAVENOUS
  Administered 2016-07-26: 50 mg via INTRAVENOUS

## 2016-07-26 MED ORDER — DEXAMETHASONE SODIUM PHOSPHATE 10 MG/ML IJ SOLN
INTRAMUSCULAR | Status: AC
Start: 2016-07-26 — End: 2016-07-26
  Filled 2016-07-26: qty 1

## 2016-07-26 MED ORDER — ACETAMINOPHEN 500 MG PO TABS
1000.0000 mg | ORAL_TABLET | ORAL | Status: AC
  Administered 2016-07-26: 1000 mg via ORAL
  Filled 2016-07-26: qty 2

## 2016-07-26 MED ORDER — SODIUM CHLORIDE 0.9 % IR SOLN
Status: DC | PRN
  Administered 2016-07-26: 500 mL

## 2016-07-26 MED ORDER — ESTRADIOL 0.1 MG/GM VA CREA
TOPICAL_CREAM | VAGINAL | Status: DC | PRN
  Administered 2016-07-26: 1 via VAGINAL

## 2016-07-26 MED ORDER — BELLADONNA ALKALOIDS-OPIUM 16.2-60 MG RE SUPP
RECTAL | Status: DC | PRN
  Administered 2016-07-26: 1 via RECTAL

## 2016-07-26 MED ORDER — ACETAMINOPHEN 500 MG PO TABS
ORAL_TABLET | ORAL | Status: AC
  Filled 2016-07-26: qty 2

## 2016-07-26 MED ORDER — PROPOFOL 10 MG/ML IV BOLUS
INTRAVENOUS | Status: AC
  Filled 2016-07-26: qty 20

## 2016-07-26 MED ORDER — CIPROFLOXACIN IN D5W 400 MG/200ML IV SOLN
400.0000 mg | Freq: Two times a day (BID) | INTRAVENOUS | Status: DC
  Administered 2016-07-26: 400 mg via INTRAVENOUS
  Filled 2016-07-26: qty 200

## 2016-07-26 MED ORDER — FENTANYL CITRATE (PF) 100 MCG/2ML IJ SOLN
INTRAMUSCULAR | Status: AC
  Filled 2016-07-26: qty 2

## 2016-07-26 MED ORDER — DEXTROSE 5 % IV SOLN
INTRAVENOUS | Status: DC | PRN
  Administered 2016-07-26: 40 ug/min via INTRAVENOUS

## 2016-07-26 MED ORDER — CEFAZOLIN IN D5W 1 GM/50ML IV SOLN
1.0000 g | INTRAVENOUS | Status: DC
  Filled 2016-07-26: qty 50

## 2016-07-26 MED ORDER — DEXAMETHASONE SODIUM PHOSPHATE 4 MG/ML IJ SOLN
INTRAMUSCULAR | Status: DC | PRN
  Administered 2016-07-26: 10 mg via INTRAVENOUS

## 2016-07-26 MED ORDER — ONDANSETRON HCL 4 MG/2ML IJ SOLN
INTRAMUSCULAR | Status: DC | PRN
  Administered 2016-07-26: 4 mg via INTRAVENOUS

## 2016-07-26 MED ORDER — OXYCODONE HCL 5 MG/5ML PO SOLN
5.0000 mg | Freq: Once | ORAL | Status: DC | PRN
  Filled 2016-07-26: qty 5

## 2016-07-26 MED ORDER — OXYCODONE HCL 5 MG PO TABS
5.0000 mg | ORAL_TABLET | Freq: Once | ORAL | Status: DC | PRN
  Filled 2016-07-26: qty 1

## 2016-07-26 MED ORDER — MEPERIDINE HCL 25 MG/ML IJ SOLN
6.2500 mg | INTRAMUSCULAR | Status: DC | PRN
  Filled 2016-07-26: qty 1

## 2016-07-26 MED ORDER — HYDROMORPHONE HCL 1 MG/ML IJ SOLN
0.2500 mg | INTRAMUSCULAR | Status: DC | PRN
  Filled 2016-07-26: qty 0.5

## 2016-07-26 MED ORDER — BELLADONNA ALKALOIDS-OPIUM 16.2-60 MG RE SUPP
RECTAL | Status: AC
  Filled 2016-07-26: qty 1

## 2016-07-26 SURGICAL SUPPLY — 58 items
BAG URINE DRAINAGE (UROLOGICAL SUPPLIES) ×2 IMPLANT
BLADE CLIPPER SURG (BLADE) ×2 IMPLANT
BLADE SURG 10 STRL SS (BLADE) ×2 IMPLANT
BLADE SURG 15 STRL LF DISP TIS (BLADE) ×1 IMPLANT
BLADE SURG 15 STRL SS (BLADE) ×2
BOOTIES KNEE HIGH SLOAN (MISCELLANEOUS) ×2 IMPLANT
CANISTER SUCTION 1200CC (MISCELLANEOUS) IMPLANT
CANISTER SUCTION 2500CC (MISCELLANEOUS) ×4 IMPLANT
CATH FOLEY 2WAY SLVR  5CC 16FR (CATHETERS) ×1
CATH FOLEY 2WAY SLVR 5CC 16FR (CATHETERS) ×1 IMPLANT
COVER BACK TABLE 60X90IN (DRAPES) ×2 IMPLANT
COVER MAYO STAND STRL (DRAPES) ×2 IMPLANT
DEVICE CAPIO SLIM BOX (INSTRUMENTS) IMPLANT
DISSECTOR ROUND CHERRY 3/8 STR (MISCELLANEOUS) IMPLANT
DRAPE UNDERBUTTOCKS STRL (DRAPE) ×2 IMPLANT
FLOSEAL 10ML (HEMOSTASIS) IMPLANT
GAUZE SPONGE 4X4 16PLY XRAY LF (GAUZE/BANDAGES/DRESSINGS) IMPLANT
GLOVE BIO SURGEON STRL SZ7.5 (GLOVE) ×2 IMPLANT
GOWN STRL REUS W/ TWL LRG LVL3 (GOWN DISPOSABLE) ×1 IMPLANT
GOWN STRL REUS W/ TWL XL LVL3 (GOWN DISPOSABLE) ×1 IMPLANT
GOWN STRL REUS W/TWL LRG LVL3 (GOWN DISPOSABLE) ×2
GOWN STRL REUS W/TWL XL LVL3 (GOWN DISPOSABLE) ×2
KIT ROOM TURNOVER WOR (KITS) ×2 IMPLANT
LIQUID BAND (GAUZE/BANDAGES/DRESSINGS) IMPLANT
MANIFOLD NEPTUNE II (INSTRUMENTS) IMPLANT
NDL 1/2 CIR CATGUT .05X1.09 (NEEDLE) IMPLANT
NEEDLE 1/2 CIR CATGUT .05X1.09 (NEEDLE) IMPLANT
NEEDLE HYPO 22GX1.5 SAFETY (NEEDLE) ×4 IMPLANT
PACKING VAGINAL (PACKING) ×2 IMPLANT
PENCIL BUTTON HOLSTER BLD 10FT (ELECTRODE) ×2 IMPLANT
PLUG CATH AND CAP STER (CATHETERS) ×2 IMPLANT
RETRACTOR LONRSTAR 16.6X16.6CM (MISCELLANEOUS) ×1 IMPLANT
RETRACTOR STAY HOOK 5MM (MISCELLANEOUS) ×2 IMPLANT
RETRACTOR STER APS 16.6X16.6CM (MISCELLANEOUS) ×2
SET IRRIG Y TYPE TUR BLADDER L (SET/KITS/TRAYS/PACK) ×2 IMPLANT
SHEET LAVH (DRAPES) ×2 IMPLANT
SLING ALTIS SYSTEM (Sling) ×2 IMPLANT
SPONGE LAP 4X18 X RAY DECT (DISPOSABLE) ×2 IMPLANT
SUCTION FRAZIER HANDLE 10FR (MISCELLANEOUS) ×1
SUCTION TUBE FRAZIER 10FR DISP (MISCELLANEOUS) ×1 IMPLANT
SUT ABS MONO DBL WITH NDL 48IN (SUTURE) IMPLANT
SUT CAPIO POLYGLYCOLIC (SUTURE) IMPLANT
SUT ETHILON 2 0 PS N (SUTURE) IMPLANT
SUT MON AB 2-0 SH 27 (SUTURE)
SUT MON AB 2-0 SH27 (SUTURE) IMPLANT
SUT NONABSORB MONO DB W/NDL 48 (SUTURE) IMPLANT
SUT PDS AB 3-0 SH 27 (SUTURE) IMPLANT
SUT VIC AB 0 CT1 36 (SUTURE) IMPLANT
SUT VIC AB 2-0 CT1 27 (SUTURE)
SUT VIC AB 2-0 CT1 TAPERPNT 27 (SUTURE) IMPLANT
SUT VIC AB 2-0 UR6 27 (SUTURE) IMPLANT
SYR BULB IRRIGATION 50ML (SYRINGE) ×2 IMPLANT
SYR CONTROL 10ML LL (SYRINGE) ×2 IMPLANT
SYRINGE 10CC LL (SYRINGE) ×2 IMPLANT
TRAY DSU PREP LF (CUSTOM PROCEDURE TRAY) ×2 IMPLANT
TUBE CONNECTING 12X1/4 (SUCTIONS) ×4 IMPLANT
WATER STERILE IRR 500ML POUR (IV SOLUTION) ×4 IMPLANT
YANKAUER SUCT BULB TIP NO VENT (SUCTIONS) IMPLANT

## 2016-07-26 NOTE — Anesthesia Procedure Notes (Signed)
Procedure Name: LMA Insertion Date/Time: 07/26/2016 9:39 AM Performed by: Bethena Roys T Pre-anesthesia Checklist: Patient identified, Emergency Drugs available, Suction available and Patient being monitored Patient Re-evaluated:Patient Re-evaluated prior to inductionOxygen Delivery Method: Circle system utilized Preoxygenation: Pre-oxygenation with 100% oxygen Intubation Type: IV induction Ventilation: Mask ventilation without difficulty LMA: LMA inserted LMA Size: 4.0 Number of attempts: 2 Airway Equipment and Method: Bite block Placement Confirmation: positive ETCO2 Tube secured with: Tape Dental Injury: Teeth and Oropharynx as per pre-operative assessment  Comments: Pt light after placement, additional propofol and repositioned with good seal.

## 2016-07-26 NOTE — Interval H&P Note (Cosign Needed)
History and Physical Interval Note:  07/26/2016 8:11 AM  [name] [name]  has presented today for surgery, with the diagnosis of stress urinary incontinence  The various methods of treatment have been discussed with the patient and family. After consideration of risks, benefits and other options for treatment, the patient has consented to  Procedure(s): PUBO-VAGINAL SLING ALTIS SLING (N/A) as a surgical intervention .  The patient's history has been reviewed, patient examined, no change in status, stable for surgery.  I have reviewed the patient's chart and labs.  Questions were answered to the patient's satisfaction.     Hailee Hollick R

## 2016-07-26 NOTE — Discharge Instructions (Signed)

## 2016-07-26 NOTE — H&P (Signed)
Office Visit Report     06/30/2016   --------------------------------------------------------------------------------   [name] [name]  MRN: [id number]  PRIMARY CARE:  Audree Camel. Alyson Ingles, MD  DOB: June 01, 1951, 65 year old Female  REFERRING:  Robert A. Alyson Ingles, MD  SSN: [id number]  PROVIDER:  Carolan Clines, M.D.    LOCATION:  [facility] [phone]   --------------------------------------------------------------------------------   CC: Incontinence  HPI: [name] [name] is a 65 year-old female patient who was referred by Dr. Audree Camel. Alyson Ingles, MD who is here for evaluation of incontinence.  The patient states the nature of her problem(s) is incontinence. Her symptoms have been present for have been present for years. Her symptoms have been getting worse over time. The patient does report urinary incontinence. She leaks urine with coughing, sneezing, bending/lifting, exercise/running, and laughing. She does have urgency incontinence. Triggers for her incontinence include sitting to standing. She does not leak urine while sleeping. The type of incontinence that is most severe is her stress incontinence. She has daytime urinary leakage 4-6 times per day.   She wears 1-2 pads per day to manage her incontinence. The pads are generally moderately wet.   She has previously [employer] LA which was only 10-15% effective.     ALLERGIES: Bactrim DS TABS - Skin Rash Ceclor CAPS - Skin Rash    MEDICATIONS: Levothyroxine Sodium 125 mcg tablet  Omeprazole 20 mg capsule,delayed release  Simvastatin 40 mg tablet  Alprazolam 0.5 mg tablet  Aspirin Ec 81 mg tablet, delayed release  Bupropion Xl 300 mg tablet, extended release 24 hr  Duexis  Duloxetine Hcl 60 mg capsule,delayed release  Methocarbamol 500 mg tablet  Valsartan 160 mg tablet  Vyvanse 50 mg capsule     GU PSH: None   NON-GU PSH: Arthrodesis, Pre-Sacral Tech Laparoscopy, Surgical; Repair Incisional Or Ventral  Hernia Thyroid Surgery    GU PMH: None   NON-GU PMH: Anxiety Encounter for general adult medical examination without abnormal findings, Encounter for preventive health examination GERD Hypercholesterolemia Hypertension Hypothyroidism Personal history of other diseases of the musculoskeletal system and connective tissue Personal history of other mental and behavioral disorders    FAMILY HISTORY: Breast Cancer - Mother Uterine Cancer - Mother   SOCIAL HISTORY: Marital Status: Married Current Smoking Status: Patient does not smoke anymore. Has not smoked since 06/17/1975. Smoked for 6 years. Smoked 2 packs per day.  Social Drinker.  Drinks 2 caffeinated drinks per day. 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     Notes: 1 son, 1 daughter   REVIEW OF SYSTEMS:    GU Review Female:   Patient reports get up at night to urinate and leakage of urine. Patient denies frequent urination, hard to postpone urination, burning /pain with urination, stream starts and stops, trouble starting your stream, have to strain to urinate, and currently pregnant.  Gastrointestinal (Upper):   Patient denies nausea, vomiting, and indigestion/ heartburn.  Gastrointestinal (Lower):   Patient reports diarrhea. Patient denies constipation.  Constitutional:   Patient reports fatigue. Patient denies fever, night sweats, and weight loss.  Skin:   Patient denies skin rash/ lesion and itching.  Eyes:   Patient reports blurred vision. Patient denies double vision.  Ears/ Nose/ Throat:   Patient reports sore throat. Patient denies sinus problems.  Hematologic/Lymphatic:   Patient reports easy bruising. Patient denies swollen glands.  Cardiovascular:   Patient reports leg swelling. Patient denies chest pains.  Respiratory:   Patient denies cough and shortness of breath.  Endocrine:  Patient reports excessive thirst.   Musculoskeletal:   Patient reports joint pain. Patient denies back pain.  Neurological:    Patient denies headaches and dizziness.  Psychologic:   Patient reports depression and anxiety.    VITAL SIGNS:      06/30/2016 11:29 AM  Weight 279 lb / 126.55 kg  Height 64 in / 162.56 cm  BP 126/73 mmHg  Pulse 80 /min  BMI 47.9 kg/m   GU PHYSICAL EXAMINATION:    Digital Rectal Exam: Normal sphincter tone. No rectal mass.  External Genitalia: No hirsutism, no rash, no scarring, no cyst, no erythematous lesion, no papular lesion, no blanched lesion, no warty lesion. No edema.  Urethral Meatus: Normal size. Normal position. No discharge.  Urethra: Q-tip test positive, marshall-bonney test positive. No tenderness, no mass, no scarring.   Bladder: Normal to palpation, no tenderness, no mass, normal size.  Vagina: No atrophy, no stenosis. No rectocele. No cystocele. No enterocele.  Cervix: No inflammation, no discharge, no lesion, no tenderness, no wart.  Uterus: Normal size. Normal consistency. Normal position. No mobility. No descent.  Adnexa / Parametria: No tenderness. No adnexal mass. Normal left ovary. Normal right ovary.  Anus and Perineum: No hemorrhoids. No anal stenosis. No rectal fissure, no anal fissure. No edema, no dimple, no perineal tenderness, no anal tenderness.   MULTI-SYSTEM PHYSICAL EXAMINATION:    Constitutional: Obese. No physical deformities. Normally developed. Good grooming.   Neck: Neck symmetrical, not swollen. Normal tracheal position.  Respiratory: No labored breathing, no use of accessory muscles.   Cardiovascular: Normal temperature, normal extremity pulses, no swelling, no varicosities.  Lymphatic: No enlargement of neck, axillae, groin.  Skin: No paleness, no jaundice, no cyanosis. No lesion, no ulcer, no rash.  Neurologic / Psychiatric: Oriented to time, oriented to place, oriented to person. No depression, no anxiety, no agitation.  Gastrointestinal: No mass, no tenderness, no rigidity, non obese abdomen. Suture ioncisions in abdomen post mesh  placement for abdominal wall hernia repair.  Eyes: Normal conjunctivae. Normal eyelids.  Ears, Nose, Mouth, and Throat: Left ear no scars, no lesions, no masses. Right ear no scars, no lesions, no masses. Nose no scars, no lesions, no masses. Normal hearing. Normal lips.  Musculoskeletal: Normal gait and station of head and neck.     PAST DATA REVIEWED:  Source Of History:  Patient  Records Review:   Previous Doctor Records   06/30/16  Urinalysis  Urine Appearance Clear   Urine Color Yellow   Urine Glucose Neg   Urine Bilirubin Neg   Urine Ketones Neg   Urine Specific Gravity 1.025   Urine Blood Neg   Urine pH 5.5   Urine Protein Neg   Urine Urobilinogen 0.2   Urine Nitrites Neg   Urine Leukocyte Esterase Trace   Urine WBC/hpf 0 - 5/hpf   Urine RBC/hpf 0 - 2/hpf   Urine Epithelial Cells 0 - 5/hpf   Urine Bacteria Few (10-25/hpf)   Urine Mucous Not Present   Urine Yeast Few (1 - 5/hpf)   Urine Trichomonas Not Present   Urine Cystals NS (Not Seen)   Urine Casts NS (Not Seen)   Urine Sperm Not Present    PROCEDURES:          Marshall test - 51700 PVR of 125cc & instilled 300cc of sterile water.         Urinalysis w/Scope Dipstick Dipstick Cont'd Micro  Color: Yellow Bilirubin: Neg WBC/hpf: 0 - 5/hpf  Appearance: Clear Ketones: Neg  RBC/hpf: 0 - 2/hpf  Specific Gravity: 1.025 Blood: Neg Bacteria: Few (10-25/hpf)  pH: 5.5 Protein: Neg Cystals: NS (Not Seen)  Glucose: Neg Urobilinogen: 0.2 Casts: NS (Not Seen)    Nitrites: Neg Trichomonas: Not Present    Leukocyte Esterase: Trace Mucous: Not Present      Epithelial Cells: 0 - 5/hpf      Yeast: Few (1 - 5/hpf)      Sperm: Not Present    ASSESSMENT:      ICD-10 Details  1 GU:   Urinary incontinence, Unspec - R32   2   Stress Incontinence, M/F - N39.3   3   Urge incontinence - N39.41           Notes:   65 year old female, referred by Dr. Alyson Ingles for evaluation for stress urinary greater than urge incontinence. She is  taken Detrol LA for her GERD symptoms. She has had stress urinary incontinence for 15 years. She is para 3-2-1. She wears contacts daily,and  changes each day. Her tampons are moderately wet. She is not sexually active at this time. Note her past history of hysterectomy, thyroidectomy, GERD, attention deficit disorder, hypertension, anxiety, depression.   Examination today shows a hyper mobile urethra, with positive Marshall test and positive Q-tip test. She has also had a ventral hernia repair with mesh. We discussed evaluation of her incontinence, and she needs to have video urodynamics, and return to the office for follow-up. She will eventually need to have an Altis single incision sling, because it can be placed transvaginally, and can be tensioned in the operating room. This can be done as an outpatient, without traversing the abdominal wall surgery.    PLAN:           Schedule Return Visit: Next Available Appointment  Procedure: Unspecified Date at [facility] [phone] - Urodynamics (Complex cystometrogram, w/ void pressure and urethral pressure profile studies, any technique) - 51729, [id number], [id number], [id number], [id number]          Document Letter(s):  Created for Patient: Clinical Summary         Notes:   urodynamics return the office for follow-up.  Cc: Dr. Maury Dus    Signed by Carolan Clines, M.D. on 06/30/16 at 6:20 PM (EST)    The information contained in this medical record document is considered private and confidential patient information. This information can only be used for the medical diagnosis and/or medical services that are being provided by the patient's selected caregivers. This information can only be distributed outside of the patient's care if the patient agrees and signs waivers of authorization for this information to be sent to an outside source or route.

## 2016-07-26 NOTE — Anesthesia Postprocedure Evaluation (Signed)
Anesthesia Post Note  Patient: [name] [name]  Procedure(s) Performed: Procedure(s) (LRB): PUBO-VAGINAL SLING ALTIS SLING (N/A)  Patient location during evaluation: PACU Anesthesia Type: General Level of consciousness: sedated and patient cooperative Pain management: pain level controlled Vital Signs Assessment: post-procedure vital signs reviewed and stable Respiratory status: spontaneous breathing Cardiovascular status: stable Anesthetic complications: no    Last Vitals:  Vitals:   07/26/16 0750 07/26/16 1134  BP: (!) 164/81 (!) 101/54  Pulse: 88 84  Resp: 18 20  Temp: 36.9 C 36.7 C    Last Pain:  Vitals:   07/26/16 1257  TempSrc:   PainSc: 0-No pain                 Nolon Nations

## 2016-07-26 NOTE — Transfer of Care (Signed)
Immediate Anesthesia Transfer of Care Note  Patient: [name] [name]  Procedure(s) Performed: Procedure(s): PUBO-VAGINAL SLING ALTIS SLING (N/A)  Patient Location: PACU  Anesthesia Type:General  Level of Consciousness: awake, alert  and oriented  Airway & Oxygen Therapy: Patient Spontanous Breathing and Patient connected to nasal cannula oxygen  Post-op Assessment: Report given to RN  Post vital signs: Reviewed and stable  Last Vitals:101/54,  83,12,99% Vitals:   07/26/16 0750  BP: (!) 164/81  Pulse: 88  Resp: 18  Temp: 36.9 C    Last Pain:  Vitals:   07/26/16 0821  TempSrc:   PainSc: 6       Patients Stated Pain Goal: 8 ([id number])  Complications: No apparent anesthesia complications

## 2016-07-26 NOTE — Anesthesia Preprocedure Evaluation (Signed)
Anesthesia Evaluation  Patient identified by MRN, date of birth, ID band Patient awake    Reviewed: Allergy & Precautions, NPO status , Patient's Chart, lab work & pertinent test results  Airway Mallampati: II  TM Distance: >3 FB Neck ROM: Full    Dental no notable dental hx.    Pulmonary neg pulmonary ROS, former smoker,    Pulmonary exam normal breath sounds clear to auscultation       Cardiovascular hypertension, negative cardio ROS Normal cardiovascular exam Rhythm:Regular Rate:Normal     Neuro/Psych PSYCHIATRIC DISORDERS Anxiety Depression negative neurological ROS  negative psych ROS   GI/Hepatic Neg liver ROS, GERD  ,  Endo/Other  Hypothyroidism Morbid obesity  Renal/GU negative Renal ROS     Musculoskeletal  (+) Arthritis ,   Abdominal   Peds  Hematology negative hematology ROS (+)   Anesthesia Other Findings   Reproductive/Obstetrics negative OB ROS                             Anesthesia Physical Anesthesia Plan  ASA: III  Anesthesia Plan: General   Post-op Pain Management:    Induction: Intravenous  Airway Management Planned: LMA  Additional Equipment:   Intra-op Plan:   Post-operative Plan: Extubation in OR  Informed Consent: I have reviewed the patients History and Physical, chart, labs and discussed the procedure including the risks, benefits and alternatives for the proposed anesthesia with the patient or authorized representative who has indicated his/her understanding and acceptance.   Dental advisory given  Plan Discussed with: CRNA  Anesthesia Plan Comments:         Anesthesia Quick Evaluation

## 2016-07-26 NOTE — Op Note (Signed)
Post-operative Diagnosis: Stress urinary incontinence  Procedure and Anesthesia:  Procedure(s) and Anesthesia Type:    * PUBO-VAGINAL SLING - General  Surgeon: Surgeon(s) and Role:    * Carolan Clines, MD - Primary   Resident:  Lorayne Bender, MD  EBL: 50 mL  IVF: See anesthesia record  UOP: Not recorded  Drains: None  Implants:   Implant Name Type Inv. Item Serial No. Manufacturer Lot No. LRB No. Used  SLING ALTIS SYSTEM - [id number] Sling SLING ALTIS SYSTEM  COLOPLAST [id number] N/A 1  SLING ALTIS SYSTEM - [id number] [place] [id number] N/A 1    Specimens: None  Complications: None  Indications for Surgery: 65 y.o. female with stress urinary incontinence. Risks, benefits, and alternatives of the above procedure were discussed previously in detail and informed consents was signed and verified.  Findings:  1. Successful placement of Altis transobturator sling with tensioning demonstrating appropriate improvement in incontinence with valsalva. 2. Cystourethroscopy with efflux of urine from the bilateral ureteral orifices following sling placement and no evidence of bladder injury.  Procedure Details: The patient and consent was verified in the pre-op holding area and brought to the operating room where they were placed on the operating table. Pre-induction time out was called and general anesthesia was induced. SCDs were placed and IV antibiotics were started.   Foley catheter was placed. The area of the bladder neck and mid urethra were marked. We then hydrodissected with approximately 20cc of Lidocaine with epinephrine. We made an incision over the midurethra. We dissected subcutaneous tissue bilaterally. The dissection was carried out towards the pelvic sidewall towards the obturator membrane just inferior to the insertion point of the adductor longus tendon.   The Altis sling was secured to the passing trocar in standard fashion first on the right side  and then the second dynamic tensioning side was also passed with the left sided trocar. We noted incorrect placement of the left side on top of vaginal mucosa. Thus the sling had to be cut and removed. A second sling was passed bilaterally in standard fashion without problems. The sling sat in an appropriate flat submucosal position.   Next, the vagina was irrigated and we refilled the bladder and tensioned the sling via the dynamic tensioning sling while testing valsalva. We tensioned it just enough to prevent stress urinary incontinence with moderate valsalva with a moderately full bladder. We assessed the sling and it was smooth against the urethra, but not tight. We were able to pass the end of a right angle between the urethral and sling without resistance. Hemostasis was confirmed.   We performed cystourethroscopy which revealed drainage of each ureter after administering Fluorescein. There was no bladder injury. The cystoscope was removed leaving the bladder relatively full. We closed the vaginal incision with 3-0 Vicryl in a running and locking fashion. The vagina was packed with a 4x18 soaked in Estrace cream. She was awakened and taken to recovery room in good condition.  Teaching Physician Attestation: Dr. Gaynelle Arabian was present for the entire and and scrubbed for the entirety of the procedure.  Lorayne Bender, MD PGY4 Urology

## 2016-07-27 ENCOUNTER — Encounter (HOSPITAL_BASED_OUTPATIENT_CLINIC_OR_DEPARTMENT_OTHER): Payer: Self-pay | Admitting: Urology

## 2016-08-17 ENCOUNTER — Encounter: Payer: Self-pay | Admitting: Sports Medicine

## 2016-08-17 ENCOUNTER — Ambulatory Visit (INDEPENDENT_AMBULATORY_CARE_PROVIDER_SITE_OTHER): Payer: BLUE CROSS/BLUE SHIELD | Admitting: Sports Medicine

## 2016-08-17 DIAGNOSIS — S76919A Strain of unspecified muscles, fascia and tendons at thigh level, unspecified thigh, initial encounter: Secondary | ICD-10-CM

## 2016-08-17 DIAGNOSIS — M25562 Pain in left knee: Secondary | ICD-10-CM

## 2016-08-17 DIAGNOSIS — M25561 Pain in right knee: Secondary | ICD-10-CM

## 2016-08-17 NOTE — Assessment & Plan Note (Signed)
Secondary to OA. Still has good ROM and would likely benefit greatly from weight loss - discussed this with patient extensively today.

## 2016-08-17 NOTE — Assessment & Plan Note (Signed)
Improving, though has limited ability to do home exercise program due to recent surgery. Will continue with gentle home exercise program for now including forward leg swings, circles, flexion and lateral exercises. Increase tramadol to twice daily. Follow up in 2 months. Consider PT referral at that time.

## 2016-08-17 NOTE — Progress Notes (Signed)
    Subjective:  [name] [name] is a 66 y.o. female who presents to the [facility] today with a chief complaint of right groin [name] follow up.   HPI:  Right Groin [name] Patient seen about about a month a go for right groin pan and was signosed with illiopsoas strain. Patient was given some home exercises however, 11 days after being seen, she underwent incontinence surgery and has not been able to do much of the exercises. Overall, the [name] has improved, and she only noticed mild [name] with abduction and flexion. She has been using the tramadol about once per day which helps significantly with the [name].   She is now able to get out of the car without a lot of difficulty She can get up from a toilet seat without too much [name]   ROS: No weakness, edema, or gait changes. Otherwise per HPI   Objective:  Physical Exam: BP (!) 155/79   Ht 5\' 5"  (1.651 m)   Wt 275 lb (124.7 kg)   BMI 45.76 kg/m   Gen: NAD, Morbidly obese. resting comfortably MSK: -R Hip: No hip deformities. Mild tenderness at greater trochanter, otherwise no [name]. Normal ROM with flexion, extension and external/internal rotation with mild [name] elicited with external rotation. Normal strength throughout.  -R Knee: Genu valgum deformity. FROM with minimal crepitus. Full extension and at least 120 degrees of flexion.  -L Hip: No hip deformities. No tenderness. Tight at IT band, otherwise Normal ROM and strength.    -L Hip: Genu valgum deformity. FROM with minimal crepitus. Full extension and at least 120 degrees of flexion.    Assessment/Plan:  Strain of iliopsoas muscle, initial encounter Improving, though has limited ability to do home exercise program due to recent surgery. Will continue with gentle home exercise program for now including forward leg swings, circles, flexion and lateral exercises. Increase tramadol to twice daily. Follow up in 2 months. Consider PT referral at that time.   KNEE [name] Secondary to OA. Still has  good ROM and would likely benefit greatly from weight loss - discussed this with patient extensively today.   Algis Greenhouse. [name] [name], [place] Resident PGY-3 08/17/2016 11:00 AM   I observed and examined the patient with the resident and agree with assessment and plan.  Note reviewed and modified by me. Stefanie Libel, MD

## 2016-08-17 NOTE — Patient Instructions (Signed)
Hip exercises  1) standing as directed   Take tramadol at least twice per day Look at [place] at the [facility] program.  See [place] again in 2 months. We will consider physical therapy at that time for pilates assessment.

## 2016-08-18 ENCOUNTER — Ambulatory Visit: Payer: BLUE CROSS/BLUE SHIELD | Admitting: Sports Medicine

## 2016-09-28 DIAGNOSIS — M6281 Muscle weakness (generalized): Secondary | ICD-10-CM | POA: Diagnosis not present

## 2016-09-28 DIAGNOSIS — R278 Other lack of coordination: Secondary | ICD-10-CM | POA: Diagnosis not present

## 2016-09-28 DIAGNOSIS — M62838 Other muscle spasm: Secondary | ICD-10-CM | POA: Diagnosis not present

## 2016-09-28 DIAGNOSIS — N3941 Urge incontinence: Secondary | ICD-10-CM | POA: Diagnosis not present

## 2016-09-28 DIAGNOSIS — N393 Stress incontinence (female) (male): Secondary | ICD-10-CM | POA: Diagnosis not present

## 2016-09-29 DIAGNOSIS — M25561 Pain in right knee: Secondary | ICD-10-CM | POA: Diagnosis not present

## 2016-09-29 DIAGNOSIS — M25562 Pain in left knee: Secondary | ICD-10-CM | POA: Diagnosis not present

## 2016-09-29 DIAGNOSIS — M170 Bilateral primary osteoarthritis of knee: Secondary | ICD-10-CM | POA: Diagnosis not present

## 2016-10-07 DIAGNOSIS — M62838 Other muscle spasm: Secondary | ICD-10-CM | POA: Diagnosis not present

## 2016-10-07 DIAGNOSIS — M6281 Muscle weakness (generalized): Secondary | ICD-10-CM | POA: Diagnosis not present

## 2016-10-07 DIAGNOSIS — N393 Stress incontinence (female) (male): Secondary | ICD-10-CM | POA: Diagnosis not present

## 2016-10-07 DIAGNOSIS — N3941 Urge incontinence: Secondary | ICD-10-CM | POA: Diagnosis not present

## 2016-10-19 DIAGNOSIS — M6281 Muscle weakness (generalized): Secondary | ICD-10-CM | POA: Diagnosis not present

## 2016-10-19 DIAGNOSIS — N3941 Urge incontinence: Secondary | ICD-10-CM | POA: Diagnosis not present

## 2016-10-19 DIAGNOSIS — M62838 Other muscle spasm: Secondary | ICD-10-CM | POA: Diagnosis not present

## 2016-10-19 DIAGNOSIS — N393 Stress incontinence (female) (male): Secondary | ICD-10-CM | POA: Diagnosis not present

## 2016-10-27 DIAGNOSIS — M6281 Muscle weakness (generalized): Secondary | ICD-10-CM | POA: Diagnosis not present

## 2016-10-27 DIAGNOSIS — N3941 Urge incontinence: Secondary | ICD-10-CM | POA: Diagnosis not present

## 2016-10-27 DIAGNOSIS — N393 Stress incontinence (female) (male): Secondary | ICD-10-CM | POA: Diagnosis not present

## 2016-10-27 DIAGNOSIS — M62838 Other muscle spasm: Secondary | ICD-10-CM | POA: Diagnosis not present

## 2016-11-02 ENCOUNTER — Ambulatory Visit: Payer: BLUE CROSS/BLUE SHIELD | Admitting: Sports Medicine

## 2016-11-02 DIAGNOSIS — M6281 Muscle weakness (generalized): Secondary | ICD-10-CM | POA: Diagnosis not present

## 2016-11-02 DIAGNOSIS — N3941 Urge incontinence: Secondary | ICD-10-CM | POA: Diagnosis not present

## 2016-11-02 DIAGNOSIS — N393 Stress incontinence (female) (male): Secondary | ICD-10-CM | POA: Diagnosis not present

## 2016-11-09 DIAGNOSIS — Z124 Encounter for screening for malignant neoplasm of cervix: Secondary | ICD-10-CM | POA: Insufficient documentation

## 2016-11-11 ENCOUNTER — Other Ambulatory Visit: Payer: Self-pay | Admitting: Family Medicine

## 2016-11-11 DIAGNOSIS — F419 Anxiety disorder, unspecified: Secondary | ICD-10-CM | POA: Diagnosis not present

## 2016-11-11 DIAGNOSIS — G4700 Insomnia, unspecified: Secondary | ICD-10-CM | POA: Diagnosis not present

## 2016-11-11 DIAGNOSIS — Z124 Encounter for screening for malignant neoplasm of cervix: Secondary | ICD-10-CM | POA: Diagnosis not present

## 2016-11-11 DIAGNOSIS — E7800 Pure hypercholesterolemia, unspecified: Secondary | ICD-10-CM | POA: Diagnosis not present

## 2016-11-11 DIAGNOSIS — E039 Hypothyroidism, unspecified: Secondary | ICD-10-CM | POA: Diagnosis not present

## 2016-11-11 DIAGNOSIS — I10 Essential (primary) hypertension: Secondary | ICD-10-CM | POA: Diagnosis not present

## 2016-11-11 DIAGNOSIS — M25562 Pain in left knee: Secondary | ICD-10-CM | POA: Diagnosis not present

## 2016-11-11 DIAGNOSIS — Z6841 Body Mass Index (BMI) 40.0 and over, adult: Secondary | ICD-10-CM | POA: Diagnosis not present

## 2016-11-11 DIAGNOSIS — F900 Attention-deficit hyperactivity disorder, predominantly inattentive type: Secondary | ICD-10-CM | POA: Diagnosis not present

## 2016-11-11 DIAGNOSIS — N390 Urinary tract infection, site not specified: Secondary | ICD-10-CM | POA: Diagnosis not present

## 2016-11-11 DIAGNOSIS — N3281 Overactive bladder: Secondary | ICD-10-CM | POA: Diagnosis not present

## 2016-11-11 DIAGNOSIS — Z1159 Encounter for screening for other viral diseases: Secondary | ICD-10-CM | POA: Diagnosis not present

## 2016-11-11 DIAGNOSIS — K219 Gastro-esophageal reflux disease without esophagitis: Secondary | ICD-10-CM | POA: Diagnosis not present

## 2016-11-11 DIAGNOSIS — Z0001 Encounter for general adult medical examination with abnormal findings: Secondary | ICD-10-CM | POA: Diagnosis not present

## 2016-11-16 LAB — CYTOLOGY - PAP: Diagnosis: NEGATIVE

## 2016-11-23 DIAGNOSIS — M6281 Muscle weakness (generalized): Secondary | ICD-10-CM | POA: Diagnosis not present

## 2016-11-23 DIAGNOSIS — N393 Stress incontinence (female) (male): Secondary | ICD-10-CM | POA: Diagnosis not present

## 2016-11-23 DIAGNOSIS — N3941 Urge incontinence: Secondary | ICD-10-CM | POA: Diagnosis not present

## 2016-11-23 DIAGNOSIS — M62838 Other muscle spasm: Secondary | ICD-10-CM | POA: Diagnosis not present

## 2016-11-25 ENCOUNTER — Other Ambulatory Visit (HOSPITAL_COMMUNITY)
Admission: RE | Admit: 2016-11-25 | Discharge: 2016-11-25 | Disposition: A | Discharge status: Home / Self Care | Payer: Medicare Other | Source: Ambulatory Visit | Attending: Family Medicine | Admitting: Family Medicine

## 2016-12-16 ENCOUNTER — Ambulatory Visit (INDEPENDENT_AMBULATORY_CARE_PROVIDER_SITE_OTHER): Payer: Medicare Other | Admitting: Sports Medicine

## 2016-12-16 DIAGNOSIS — S76919A Strain of unspecified muscles, fascia and tendons at thigh level, unspecified thigh, initial encounter: Secondary | ICD-10-CM

## 2016-12-16 NOTE — Assessment & Plan Note (Signed)
Very good improvement on water exercise program  Suggested keeping up exercise No meds at present

## 2016-12-16 NOTE — Progress Notes (Signed)
Cc:RT groin pain  # 2 Follow up of obesity and weight loss plan  Since last visit patient has enrolled and doing 10 hpw of water exercise She can do this with minimal pain in knees and groin On last visit unable to walk up steps 2/2 pain Since exercise program able to do this Feels that she is walking with less sway  Obesity Really wants to lose weight and feels she is down about 10 lbs on new program Diet - we discussed pritikin principles before She is trying to find vegetable based program she likes  Fam Hx:  Daughter is on chronic O2 for [facility] On [facility] double lung transplant list Son died in childhood from carotid dissection  ROS Knee pain bilat Not much swelling in knees No sciatica  PE Obese F in NAD BP (!) 160/69   Ht 5' 4.5" (1.638 m)   Wt 275 lb (124.7 kg)   BMI 46.47 kg/m   RT and LT hips - full ROM on IR and ER RT hip abduction strong Adduction strong Flexion strong Sartorius testing is strong with only mild pain now  Gait show less trunk and trendelenburg shift and is improved 1 ankle fused and walks with no flexion

## 2016-12-30 DIAGNOSIS — M170 Bilateral primary osteoarthritis of knee: Secondary | ICD-10-CM | POA: Diagnosis not present

## 2017-01-01 ENCOUNTER — Encounter (HOSPITAL_COMMUNITY): Payer: Self-pay | Admitting: Emergency Medicine

## 2017-01-01 ENCOUNTER — Emergency Department (HOSPITAL_COMMUNITY)
Admission: EM | Admit: 2017-01-01 | Discharge: 2017-01-01 | Disposition: A | Discharge status: Home / Self Care | Payer: Medicare Other | Attending: Emergency Medicine | Admitting: Emergency Medicine

## 2017-01-01 ENCOUNTER — Emergency Department (HOSPITAL_COMMUNITY): Payer: Medicare Other

## 2017-01-01 DIAGNOSIS — E039 Hypothyroidism, unspecified: Secondary | ICD-10-CM | POA: Insufficient documentation

## 2017-01-01 DIAGNOSIS — R079 Chest pain, unspecified: Secondary | ICD-10-CM

## 2017-01-01 DIAGNOSIS — I10 Essential (primary) hypertension: Secondary | ICD-10-CM | POA: Insufficient documentation

## 2017-01-01 DIAGNOSIS — Z79899 Other long term (current) drug therapy: Secondary | ICD-10-CM | POA: Insufficient documentation

## 2017-01-01 DIAGNOSIS — Z87891 Personal history of nicotine dependence: Secondary | ICD-10-CM | POA: Insufficient documentation

## 2017-01-01 DIAGNOSIS — R072 Precordial pain: Secondary | ICD-10-CM | POA: Diagnosis not present

## 2017-01-01 DIAGNOSIS — F909 Attention-deficit hyperactivity disorder, unspecified type: Secondary | ICD-10-CM | POA: Diagnosis not present

## 2017-01-01 DIAGNOSIS — R0789 Other chest pain: Secondary | ICD-10-CM | POA: Diagnosis not present

## 2017-01-01 LAB — CBC
HCT: 41.6 % (ref 36.0–46.0)
Hemoglobin: 13 g/dL (ref 12.0–15.0)
MCH: 27.1 pg (ref 26.0–34.0)
MCHC: 31.3 g/dL (ref 30.0–36.0)
MCV: 86.7 fL (ref 78.0–100.0)
PLATELETS: 448 10*3/uL — AB (ref 150–400)
RBC: 4.8 MIL/uL (ref 3.87–5.11)
RDW: 15.2 % (ref 11.5–15.5)
WBC: 11 10*3/uL — ABNORMAL HIGH (ref 4.0–10.5)

## 2017-01-01 LAB — I-STAT TROPONIN, ED
TROPONIN I, POC: 0 ng/mL (ref 0.00–0.08)
Troponin i, poc: 0 ng/mL (ref 0.00–0.08)

## 2017-01-01 LAB — BASIC METABOLIC PANEL
Anion gap: 9 (ref 5–15)
BUN: 19 mg/dL (ref 6–20)
CALCIUM: 9.3 mg/dL (ref 8.9–10.3)
CO2: 23 mmol/L (ref 22–32)
CREATININE: 0.82 mg/dL (ref 0.44–1.00)
Chloride: 104 mmol/L (ref 101–111)
GFR calc non Af Amer: >60 mL/min (ref >60)
GLUCOSE: 104 mg/dL — AB (ref 65–99)
Potassium: 3.7 mmol/L (ref 3.5–5.1)
Sodium: 136 mmol/L (ref 135–145)

## 2017-01-01 NOTE — ED Notes (Signed)
Patient is alert and orientedx4.  Patient was explained discharge instructions and they understood them with no questions.   

## 2017-01-01 NOTE — ED Triage Notes (Signed)
Pt sts right sided jaw pain with radiation to chest and flushed feeling all day; pt sts feels similar to anxiety but was not relieved by xanax

## 2017-01-01 NOTE — ED Provider Notes (Signed)
[facility] DEPT Provider Note   CSN: [id number] Arrival date & time: 01/01/17  1608     History   Chief Complaint Chief Complaint  Patient presents with  . Jaw Pain  . Chest Pain    HPI [name] [name] is a 66 y.o. female.  The history is provided by the patient.  Chest Pain   This is a new problem. The current episode started yesterday. The problem occurs rarely. The problem has been resolved. The pain is associated with rest and an emotional upset. The pain is present in the substernal region. The quality of the pain is described as pressure-like. The pain does not radiate. Pertinent negatives include no abdominal pain, no back pain, no cough, no dizziness, no fever, no headaches, no hemoptysis, no leg pain, no lower extremity edema, no malaise/fatigue, no nausea, no palpitations, no shortness of breath, no syncope, no vomiting and no weakness. Treatments tried: xanax. The treatment provided moderate relief. Risk factors include being elderly.  Her past medical history is significant for hyperlipidemia and hypertension.  Pertinent negatives for past medical history include no diabetes and no seizures.    Past Medical History:  Diagnosis Date  . ADHD (attention deficit hyperactivity disorder)   . Anxiety   . Arthritis   . Depression   . GERD (gastroesophageal reflux disease)   . History of panic attacks   . Hyperlipidemia   . Hypertension   . Hypothyroidism, postsurgical   . SUI (stress urinary incontinence, female)   . Wears glasses     Patient Active Problem List   Diagnosis Date Noted  . Obesity, morbid ([place]) 12/16/2016  . Strain of iliopsoas muscle, initial encounter 07/15/2016  . Elevated cholesterol   . Urinary incontinence   . Endometrial polyp   . DEGENERATIVE JOINT DISEASE, RIGHT KNEE 05/15/2009  . DERANGEMENT OF ANTERIOR HORN OF LATERAL MENISCUS 05/15/2009  . KNEE PAIN 05/15/2009    Past Surgical History:  Procedure Laterality Date  . D & C  HYSTEROSCOPY W/ RESECTION ENDOMETRIAL POLYP  04/15/2009  . D & C HYSTEROSCOPY W/ RESECTOSCOPE AND THERMACHOICE ENDOMETRIAL ABLATION  12/09/2010  . PUBOVAGINAL SLING N/A 07/26/2016   Procedure: PUBO-VAGINAL SLING ALTIS SLING;  Surgeon: Carolan Clines, MD;  Location: [facility];  Service: Urology;  Laterality: N/A;  . RESECTION GIANT CELL TUMOR RIGHT LONG FINGER  09/13/2008  . THYROIDECTOMY  1979    [facility]   non-malignant diseased thyroid  . TRIPLE ARTHRODESIS LEFT FOOT  04/20/2007  . VENTRAL HERNIA REPAIR  2003  . WRIST GANGLION EXCISION Right 1975    OB History    Gravida Para Term Preterm AB Living   3 2 2   1 1    SAB TAB Ectopic Multiple Live Births                   Home Medications    Prior to Admission medications   Medication Sig Start Date End Date Taking? Authorizing Provider  ALPRAZolam Duanne Moron) 0.5 MG tablet Take 0.5 mg by mouth 3 (three) times daily as needed for anxiety.    [provider]  buPROPion (WELLBUTRIN XL) 300 MG 24 hr tablet TK 1 T PO QAM 05/26/16   [provider]  DULoxetine (CYMBALTA) 60 MG capsule TK 2 CS PO ONCE D WITH A MEAL  --- TAKES IN AM 05/26/16   [provider]  levothyroxine (SYNTHROID, LEVOTHROID) 125 MCG tablet TK 1 T PO  QAM ON AN EMPTY STOMACH 05/02/16  [provider]  lisdexamfetamine (VYVANSE) 50 MG capsule Take 50 mg by mouth every morning.    [provider]  methocarbamol (ROBAXIN) 500 MG tablet take 1 tablet by mouth three times a day if needed for SPASMS 06/25/16   [provider]  omeprazole (PRILOSEC) 20 MG capsule Take 20 mg by mouth every morning.    [provider]  oxyCODONE-acetaminophen (ROXICET) 5-325 MG tablet Take 1-2 tablets by mouth every 6 (six) hours as needed for severe pain. 07/26/16   Carolan Clines, MD  simvastatin (ZOCOR) 40 MG tablet TK 1 T PO QAM 05/26/16   [provider]  traMADol (ULTRAM) 50 MG tablet Take 1 tablet  (50 mg total) by mouth every 6 (six) hours as needed. 07/15/16   Stefanie Libel, MD  valsartan (DIOVAN) 160 MG tablet TK 1 T PO QAM 05/26/16   [provider]    Family History Family History  Problem Relation Age of Onset  . Breast cancer Mother   . Uterine cancer Mother   . Heart disease Paternal Grandfather   . Diabetes Maternal Grandfather     Social History Social History  Substance Use Topics  . Smoking status: Former Smoker    Years: 6.00    Types: Cigarettes    Quit date: 09/21/1974  . Smokeless tobacco: Never Used  . Alcohol use Yes     Comment: very rare     Allergies   Ceclor [cefaclor] and Bactrim [sulfamethoxazole-trimethoprim]   Review of Systems Review of Systems  Constitutional: Negative for chills, fever and malaise/fatigue.  HENT: Negative for ear pain and sore throat.   Eyes: Negative for pain and visual disturbance.  Respiratory: Negative for cough, hemoptysis and shortness of breath.   Cardiovascular: Positive for chest pain. Negative for palpitations and syncope.  Gastrointestinal: Negative for abdominal pain, nausea and vomiting.  Genitourinary: Negative for dysuria and hematuria.  Musculoskeletal: Negative for arthralgias and back pain.  Skin: Negative for color change and rash.  Neurological: Negative for dizziness, seizures, syncope, weakness and headaches.  Psychiatric/Behavioral: The patient is nervous/anxious.   All other systems reviewed and are negative.    Physical Exam Updated Vital Signs  ED Triage Vitals  Enc Vitals Group     BP 01/01/17 1623 (!) 156/78     Pulse Rate 01/01/17 1623 98     Resp 01/01/17 1623 (!) 21     Temp 01/01/17 1623 98.1 F (36.7 C)     Temp Source 01/01/17 1623 Oral     SpO2 01/01/17 1623 96 %     Weight --      Height --      Head Circumference --      Peak Flow --      Pain Score 01/01/17 1621 6     Pain Loc --      Pain Edu? --      Excl. in [place]? --     Physical Exam  Constitutional:  She is oriented to person, place, and time. She appears well-developed and well-nourished. No distress.  HENT:  Head: Normocephalic and atraumatic.  Eyes: Conjunctivae are normal. Pupils are equal, round, and reactive to light.  Neck: Normal range of motion. Neck supple.  Cardiovascular: Normal rate, regular rhythm, normal heart sounds and intact distal pulses.   No murmur heard. Pulmonary/Chest: Effort normal and breath sounds normal. No respiratory distress. She has no wheezes.  Abdominal: Soft. There is no tenderness.  Musculoskeletal: She exhibits no edema.  Neurological: She is alert and oriented to person, place, and time.  Skin: Skin is warm and dry. Capillary refill takes less than 2 seconds.  Psychiatric: She has a normal mood and affect. Her behavior is normal. Judgment and thought content normal.  Nursing note and vitals reviewed.    ED Treatments / Results  Labs (all labs ordered are listed, but only abnormal results are displayed) Labs Reviewed  BASIC METABOLIC PANEL - Abnormal; Notable for the following:       Result Value   Glucose, Bld 104 (*)    All other components within normal limits  CBC - Abnormal; Notable for the following:    WBC 11.0 (*)    Platelets 448 (*)    All other components within normal limits  I-STAT TROPOININ, ED  I-STAT TROPOININ, ED    EKG  EKG Interpretation  Date/Time:  Saturday Jan 01 2017 16:14:19 EDT Ventricular Rate:  102 PR Interval:  130 QRS Duration: 80 QT Interval:  324 QTC Calculation: 422 R Axis:   -19 Text Interpretation:  Sinus tachycardia Left ventricular hypertrophy with repolarization abnormality Abnormal ECG No significant change since last tracing Confirmed by Merrily Pew [id number]) on 01/01/2017 8:18:08 PM       Radiology Dg Chest 2 View  Result Date: 01/01/2017 CLINICAL DATA:  Anxiety attack yesterday.  Chest pain. EXAM: CHEST  2 VIEW COMPARISON:  February 19, 2015 FINDINGS: The heart size and mediastinal contours  are within normal limits. Both lungs are clear. The visualized skeletal structures are unremarkable. IMPRESSION: No active cardiopulmonary disease. Electronically Signed   By: Dorise Bullion III M.D   On: 01/01/2017 17:42    Procedures Procedures (including critical care time)  Medications Ordered in ED Medications - No data to display   Initial Impression / Assessment and Plan / ED Course  I have reviewed the triage vital signs and the nursing notes.  Pertinent labs & imaging results that were available during my care of the patient were reviewed by me and considered in my medical decision making (see chart for details).     [name] [name] is a 66 year old female with history of high blood pressure, high cholesterol, anxiety who presents to the ED with chest pain. Patient's vitals at time of arrival to the ED are unremarkable and patient is without fever. Patient with multiple episodes of right ear pain and jaw pain with radiation to her chest over the last day. Patient states that the symptoms are similar to her prior anxiety attacks. She states that every time she has these types of attacks, patient takes Xanax and symptoms improve. Today she states that the symptoms took a long time to improve following Xanax and that brought her to the ED. Patient states that she had a negative stress tests multiple years ago. Patient states that when she had EKG done in triage, her symptoms went away after there was no sign of heart attack. Patient is currently asymptomatic. Patient states that she has had a lot of stress with the death of her son still effecting her and her daughter to have surgery for bilateral lung transplant. Patient believes all symptoms are due to anxiety and despite multiple cardiac risk factors agree with patient. Exam is overall unremarkable. Clear breath sounds, nontender abdomen, no peripheral edema. Patient is overall well-appearing. Patient with EKG that shows sinus tachycardia at  102 bpm but otherwise no signs of ischemia and normal intervals. Patient has no PE or DVT risk factors and  doubt PE. Patient likely with symptoms secondary to anxiety given symptoms consistent with her anxiety attacks in the past. Will evaluate for ACS with chest x-ray, CBC, BMP, serial troponins.  Chest x-ray with no signs of pneumonia, pleural effusion, pneumothorax. Patient with serial troponins within normal limits and doubt ACS. Patient with no signs of significant anemia or electrolyte abnormalities. Overall workup is unremarkable. Patient likely with anxiety attack. However, given her risk factors do recommend patient closely follow-up with primary care provider for outpatient stress test and possible Holter monitor. Patient is agreeable to plan and given strict return precautions. Patient discharged from the ED in good condition.   Final Clinical Impressions(s) / ED Diagnoses   Final diagnoses:  Chest pain, unspecified type    New Prescriptions New Prescriptions   No medications on file     Lennice Sites, DO 01/01/17 2323    Mesner, Corene Cornea, MD 01/01/17 2324

## 2017-01-03 DIAGNOSIS — F419 Anxiety disorder, unspecified: Secondary | ICD-10-CM | POA: Diagnosis not present

## 2017-03-28 DIAGNOSIS — H43811 Vitreous degeneration, right eye: Secondary | ICD-10-CM | POA: Diagnosis not present

## 2017-04-04 DIAGNOSIS — M25562 Pain in left knee: Secondary | ICD-10-CM | POA: Diagnosis not present

## 2017-04-04 DIAGNOSIS — M25561 Pain in right knee: Secondary | ICD-10-CM | POA: Diagnosis not present

## 2017-04-04 DIAGNOSIS — M170 Bilateral primary osteoarthritis of knee: Secondary | ICD-10-CM | POA: Diagnosis not present

## 2017-04-25 DIAGNOSIS — H43813 Vitreous degeneration, bilateral: Secondary | ICD-10-CM | POA: Diagnosis not present

## 2017-05-17 DIAGNOSIS — I10 Essential (primary) hypertension: Secondary | ICD-10-CM | POA: Diagnosis not present

## 2017-05-17 DIAGNOSIS — F900 Attention-deficit hyperactivity disorder, predominantly inattentive type: Secondary | ICD-10-CM | POA: Diagnosis not present

## 2017-05-17 DIAGNOSIS — Z6841 Body Mass Index (BMI) 40.0 and over, adult: Secondary | ICD-10-CM | POA: Diagnosis not present

## 2017-05-17 DIAGNOSIS — E039 Hypothyroidism, unspecified: Secondary | ICD-10-CM | POA: Diagnosis not present

## 2017-05-17 DIAGNOSIS — G4700 Insomnia, unspecified: Secondary | ICD-10-CM | POA: Diagnosis not present

## 2017-05-17 DIAGNOSIS — L710 Perioral dermatitis: Secondary | ICD-10-CM | POA: Diagnosis not present

## 2017-05-17 DIAGNOSIS — F324 Major depressive disorder, single episode, in partial remission: Secondary | ICD-10-CM | POA: Diagnosis not present

## 2017-05-17 DIAGNOSIS — Z23 Encounter for immunization: Secondary | ICD-10-CM | POA: Diagnosis not present

## 2017-05-17 DIAGNOSIS — K219 Gastro-esophageal reflux disease without esophagitis: Secondary | ICD-10-CM | POA: Diagnosis not present

## 2017-05-17 DIAGNOSIS — E7800 Pure hypercholesterolemia, unspecified: Secondary | ICD-10-CM | POA: Diagnosis not present

## 2017-05-17 DIAGNOSIS — F419 Anxiety disorder, unspecified: Secondary | ICD-10-CM | POA: Diagnosis not present

## 2017-06-27 ENCOUNTER — Other Ambulatory Visit: Payer: Self-pay | Admitting: Family Medicine

## 2017-06-27 DIAGNOSIS — Z1231 Encounter for screening mammogram for malignant neoplasm of breast: Secondary | ICD-10-CM

## 2017-07-15 DIAGNOSIS — M170 Bilateral primary osteoarthritis of knee: Secondary | ICD-10-CM | POA: Diagnosis not present

## 2017-07-15 DIAGNOSIS — M25561 Pain in right knee: Secondary | ICD-10-CM | POA: Diagnosis not present

## 2017-07-15 DIAGNOSIS — M25562 Pain in left knee: Secondary | ICD-10-CM | POA: Diagnosis not present

## 2017-07-26 ENCOUNTER — Ambulatory Visit
Admission: RE | Admit: 2017-07-26 | Discharge: 2017-07-26 | Disposition: A | Discharge status: Home / Self Care | Payer: Medicare Other | Source: Ambulatory Visit | Attending: Family Medicine | Admitting: Family Medicine

## 2017-07-26 DIAGNOSIS — Z1231 Encounter for screening mammogram for malignant neoplasm of breast: Secondary | ICD-10-CM | POA: Diagnosis not present

## 2017-09-16 DIAGNOSIS — L710 Perioral dermatitis: Secondary | ICD-10-CM | POA: Diagnosis not present

## 2017-10-24 DIAGNOSIS — M1711 Unilateral primary osteoarthritis, right knee: Secondary | ICD-10-CM | POA: Diagnosis not present

## 2017-10-24 DIAGNOSIS — M1712 Unilateral primary osteoarthritis, left knee: Secondary | ICD-10-CM | POA: Diagnosis not present

## 2017-10-24 DIAGNOSIS — M170 Bilateral primary osteoarthritis of knee: Secondary | ICD-10-CM | POA: Diagnosis not present

## 2017-11-15 DIAGNOSIS — E039 Hypothyroidism, unspecified: Secondary | ICD-10-CM | POA: Diagnosis not present

## 2017-11-15 DIAGNOSIS — E7800 Pure hypercholesterolemia, unspecified: Secondary | ICD-10-CM | POA: Diagnosis not present

## 2017-11-15 DIAGNOSIS — N3281 Overactive bladder: Secondary | ICD-10-CM | POA: Diagnosis not present

## 2017-11-15 DIAGNOSIS — Z0000 Encounter for general adult medical examination without abnormal findings: Secondary | ICD-10-CM | POA: Diagnosis not present

## 2017-11-15 DIAGNOSIS — D692 Other nonthrombocytopenic purpura: Secondary | ICD-10-CM | POA: Diagnosis not present

## 2017-11-15 DIAGNOSIS — F419 Anxiety disorder, unspecified: Secondary | ICD-10-CM | POA: Diagnosis not present

## 2017-11-15 DIAGNOSIS — K219 Gastro-esophageal reflux disease without esophagitis: Secondary | ICD-10-CM | POA: Diagnosis not present

## 2017-11-15 DIAGNOSIS — F324 Major depressive disorder, single episode, in partial remission: Secondary | ICD-10-CM | POA: Diagnosis not present

## 2017-11-15 DIAGNOSIS — G4700 Insomnia, unspecified: Secondary | ICD-10-CM | POA: Diagnosis not present

## 2017-11-15 DIAGNOSIS — F900 Attention-deficit hyperactivity disorder, predominantly inattentive type: Secondary | ICD-10-CM | POA: Diagnosis not present

## 2017-11-15 DIAGNOSIS — I10 Essential (primary) hypertension: Secondary | ICD-10-CM | POA: Diagnosis not present

## 2018-01-05 DIAGNOSIS — E2839 Other primary ovarian failure: Secondary | ICD-10-CM | POA: Diagnosis not present

## 2018-01-05 DIAGNOSIS — M8588 Other specified disorders of bone density and structure, other site: Secondary | ICD-10-CM | POA: Diagnosis not present

## 2018-01-06 DIAGNOSIS — J069 Acute upper respiratory infection, unspecified: Secondary | ICD-10-CM | POA: Diagnosis not present

## 2018-01-06 DIAGNOSIS — R05 Cough: Secondary | ICD-10-CM | POA: Diagnosis not present

## 2018-01-26 DIAGNOSIS — M25562 Pain in left knee: Secondary | ICD-10-CM | POA: Diagnosis not present

## 2018-01-26 DIAGNOSIS — M25561 Pain in right knee: Secondary | ICD-10-CM | POA: Diagnosis not present

## 2018-01-26 DIAGNOSIS — M170 Bilateral primary osteoarthritis of knee: Secondary | ICD-10-CM | POA: Diagnosis not present

## 2018-03-10 DIAGNOSIS — H9201 Otalgia, right ear: Secondary | ICD-10-CM | POA: Diagnosis not present

## 2018-03-10 DIAGNOSIS — M26621 Arthralgia of right temporomandibular joint: Secondary | ICD-10-CM | POA: Diagnosis not present

## 2018-03-10 DIAGNOSIS — D692 Other nonthrombocytopenic purpura: Secondary | ICD-10-CM | POA: Diagnosis not present

## 2018-03-23 DIAGNOSIS — M1711 Unilateral primary osteoarthritis, right knee: Secondary | ICD-10-CM | POA: Diagnosis not present

## 2018-03-23 DIAGNOSIS — M1712 Unilateral primary osteoarthritis, left knee: Secondary | ICD-10-CM | POA: Diagnosis not present

## 2018-03-30 DIAGNOSIS — M25562 Pain in left knee: Secondary | ICD-10-CM | POA: Diagnosis not present

## 2018-03-30 DIAGNOSIS — M170 Bilateral primary osteoarthritis of knee: Secondary | ICD-10-CM | POA: Diagnosis not present

## 2018-03-30 DIAGNOSIS — M25561 Pain in right knee: Secondary | ICD-10-CM | POA: Diagnosis not present

## 2018-04-06 DIAGNOSIS — M170 Bilateral primary osteoarthritis of knee: Secondary | ICD-10-CM | POA: Diagnosis not present

## 2018-04-13 ENCOUNTER — Other Ambulatory Visit (HOSPITAL_COMMUNITY): Payer: Self-pay | Admitting: Family Medicine

## 2018-04-13 ENCOUNTER — Ambulatory Visit (HOSPITAL_COMMUNITY): Payer: PPO | Attending: Cardiology

## 2018-04-13 DIAGNOSIS — Z01818 Encounter for other preprocedural examination: Secondary | ICD-10-CM

## 2018-04-13 DIAGNOSIS — Z01810 Encounter for preprocedural cardiovascular examination: Secondary | ICD-10-CM | POA: Insufficient documentation

## 2018-04-13 DIAGNOSIS — Z6841 Body Mass Index (BMI) 40.0 and over, adult: Secondary | ICD-10-CM | POA: Insufficient documentation

## 2018-04-13 DIAGNOSIS — I10 Essential (primary) hypertension: Secondary | ICD-10-CM | POA: Insufficient documentation

## 2018-04-13 MED ORDER — REGADENOSON 0.4 MG/5ML IV SOLN
0.4000 mg | Freq: Once | INTRAVENOUS | Status: AC
  Administered 2018-04-13: 0.4 mg via INTRAVENOUS

## 2018-04-13 MED ORDER — TECHNETIUM TC 99M TETROFOSMIN IV KIT
31.8000 | PACK | Freq: Once | INTRAVENOUS | Status: AC | PRN
  Administered 2018-04-13: 31.8 via INTRAVENOUS
  Filled 2018-04-13: qty 32

## 2018-04-14 ENCOUNTER — Ambulatory Visit (HOSPITAL_COMMUNITY): Payer: PPO | Attending: Cardiovascular Disease

## 2018-04-14 LAB — MYOCARDIAL PERFUSION IMAGING
LV dias vol: 72 mL (ref 46–106)
LV sys vol: 33 mL
Peak HR: 92 {beats}/min
Rest HR: 82 {beats}/min
SDS: 5
SRS: 3
SSS: 8
TID: 0.92

## 2018-04-14 MED ORDER — TECHNETIUM TC 99M TETROFOSMIN IV KIT
30.2000 | PACK | Freq: Once | INTRAVENOUS | Status: AC | PRN
  Administered 2018-04-14: 30.2 via INTRAVENOUS
  Filled 2018-04-14: qty 31

## 2018-04-17 DIAGNOSIS — I10 Essential (primary) hypertension: Secondary | ICD-10-CM | POA: Diagnosis not present

## 2018-04-17 DIAGNOSIS — E639 Nutritional deficiency, unspecified: Secondary | ICD-10-CM | POA: Diagnosis not present

## 2018-04-17 DIAGNOSIS — E785 Hyperlipidemia, unspecified: Secondary | ICD-10-CM | POA: Diagnosis not present

## 2018-04-18 DIAGNOSIS — I10 Essential (primary) hypertension: Secondary | ICD-10-CM | POA: Diagnosis not present

## 2018-04-18 DIAGNOSIS — E039 Hypothyroidism, unspecified: Secondary | ICD-10-CM | POA: Diagnosis not present

## 2018-04-18 DIAGNOSIS — E639 Nutritional deficiency, unspecified: Secondary | ICD-10-CM | POA: Diagnosis not present

## 2018-04-18 DIAGNOSIS — E785 Hyperlipidemia, unspecified: Secondary | ICD-10-CM | POA: Diagnosis not present

## 2018-04-19 DIAGNOSIS — I10 Essential (primary) hypertension: Secondary | ICD-10-CM | POA: Diagnosis not present

## 2018-04-19 DIAGNOSIS — M25562 Pain in left knee: Secondary | ICD-10-CM | POA: Diagnosis not present

## 2018-04-19 DIAGNOSIS — G8929 Other chronic pain: Secondary | ICD-10-CM | POA: Diagnosis not present

## 2018-04-19 DIAGNOSIS — M170 Bilateral primary osteoarthritis of knee: Secondary | ICD-10-CM | POA: Diagnosis not present

## 2018-04-19 DIAGNOSIS — E785 Hyperlipidemia, unspecified: Secondary | ICD-10-CM | POA: Diagnosis not present

## 2018-04-19 DIAGNOSIS — E639 Nutritional deficiency, unspecified: Secondary | ICD-10-CM | POA: Diagnosis not present

## 2018-04-21 DIAGNOSIS — F4323 Adjustment disorder with mixed anxiety and depressed mood: Secondary | ICD-10-CM | POA: Diagnosis not present

## 2018-05-01 DIAGNOSIS — M25562 Pain in left knee: Secondary | ICD-10-CM | POA: Diagnosis not present

## 2018-05-01 DIAGNOSIS — G8929 Other chronic pain: Secondary | ICD-10-CM | POA: Diagnosis not present

## 2018-05-01 DIAGNOSIS — F4322 Adjustment disorder with anxiety: Secondary | ICD-10-CM | POA: Diagnosis not present

## 2018-05-04 DIAGNOSIS — E639 Nutritional deficiency, unspecified: Secondary | ICD-10-CM | POA: Diagnosis not present

## 2018-05-04 DIAGNOSIS — M25562 Pain in left knee: Secondary | ICD-10-CM | POA: Diagnosis not present

## 2018-05-04 DIAGNOSIS — G8929 Other chronic pain: Secondary | ICD-10-CM | POA: Diagnosis not present

## 2018-05-04 DIAGNOSIS — I10 Essential (primary) hypertension: Secondary | ICD-10-CM | POA: Diagnosis not present

## 2018-05-04 DIAGNOSIS — E785 Hyperlipidemia, unspecified: Secondary | ICD-10-CM | POA: Diagnosis not present

## 2018-05-09 DIAGNOSIS — E785 Hyperlipidemia, unspecified: Secondary | ICD-10-CM | POA: Diagnosis not present

## 2018-05-09 DIAGNOSIS — I10 Essential (primary) hypertension: Secondary | ICD-10-CM | POA: Diagnosis not present

## 2018-05-09 DIAGNOSIS — E639 Nutritional deficiency, unspecified: Secondary | ICD-10-CM | POA: Diagnosis not present

## 2018-05-09 DIAGNOSIS — E559 Vitamin D deficiency, unspecified: Secondary | ICD-10-CM | POA: Diagnosis not present

## 2018-05-09 DIAGNOSIS — Z6841 Body Mass Index (BMI) 40.0 and over, adult: Secondary | ICD-10-CM | POA: Diagnosis not present

## 2018-05-17 DIAGNOSIS — Z6841 Body Mass Index (BMI) 40.0 and over, adult: Secondary | ICD-10-CM | POA: Diagnosis not present

## 2018-05-17 DIAGNOSIS — R7303 Prediabetes: Secondary | ICD-10-CM | POA: Diagnosis not present

## 2018-05-17 DIAGNOSIS — E7800 Pure hypercholesterolemia, unspecified: Secondary | ICD-10-CM | POA: Diagnosis not present

## 2018-05-17 DIAGNOSIS — I10 Essential (primary) hypertension: Secondary | ICD-10-CM | POA: Diagnosis not present

## 2018-05-17 DIAGNOSIS — K219 Gastro-esophageal reflux disease without esophagitis: Secondary | ICD-10-CM | POA: Diagnosis not present

## 2018-05-17 DIAGNOSIS — Z23 Encounter for immunization: Secondary | ICD-10-CM | POA: Diagnosis not present

## 2018-05-17 DIAGNOSIS — E039 Hypothyroidism, unspecified: Secondary | ICD-10-CM | POA: Diagnosis not present

## 2018-07-03 DIAGNOSIS — M79641 Pain in right hand: Secondary | ICD-10-CM | POA: Diagnosis not present

## 2018-07-19 DIAGNOSIS — M25562 Pain in left knee: Secondary | ICD-10-CM | POA: Diagnosis not present

## 2018-07-19 DIAGNOSIS — M25561 Pain in right knee: Secondary | ICD-10-CM | POA: Diagnosis not present

## 2018-07-19 DIAGNOSIS — M1712 Unilateral primary osteoarthritis, left knee: Secondary | ICD-10-CM | POA: Diagnosis not present

## 2018-07-19 DIAGNOSIS — M1711 Unilateral primary osteoarthritis, right knee: Secondary | ICD-10-CM | POA: Diagnosis not present

## 2018-08-02 DIAGNOSIS — S81831A Puncture wound without foreign body, right lower leg, initial encounter: Secondary | ICD-10-CM | POA: Diagnosis not present

## 2018-08-21 DIAGNOSIS — F4310 Post-traumatic stress disorder, unspecified: Secondary | ICD-10-CM | POA: Diagnosis not present

## 2018-09-04 DIAGNOSIS — F4310 Post-traumatic stress disorder, unspecified: Secondary | ICD-10-CM | POA: Diagnosis not present

## 2018-09-13 DIAGNOSIS — M1712 Unilateral primary osteoarthritis, left knee: Secondary | ICD-10-CM | POA: Diagnosis not present

## 2018-09-13 DIAGNOSIS — M25561 Pain in right knee: Secondary | ICD-10-CM | POA: Diagnosis not present

## 2018-09-13 DIAGNOSIS — M1711 Unilateral primary osteoarthritis, right knee: Secondary | ICD-10-CM | POA: Diagnosis not present

## 2018-09-13 DIAGNOSIS — E669 Obesity, unspecified: Secondary | ICD-10-CM | POA: Diagnosis not present

## 2018-09-18 DIAGNOSIS — F4310 Post-traumatic stress disorder, unspecified: Secondary | ICD-10-CM | POA: Diagnosis not present

## 2018-10-02 DIAGNOSIS — F4310 Post-traumatic stress disorder, unspecified: Secondary | ICD-10-CM | POA: Diagnosis not present

## 2018-10-16 DIAGNOSIS — F4310 Post-traumatic stress disorder, unspecified: Secondary | ICD-10-CM | POA: Diagnosis not present

## 2018-10-23 DIAGNOSIS — L98499 Non-pressure chronic ulcer of skin of other sites with unspecified severity: Secondary | ICD-10-CM | POA: Diagnosis not present

## 2018-11-06 DIAGNOSIS — F4310 Post-traumatic stress disorder, unspecified: Secondary | ICD-10-CM | POA: Diagnosis not present

## 2018-11-13 DIAGNOSIS — L718 Other rosacea: Secondary | ICD-10-CM | POA: Diagnosis not present

## 2018-11-13 DIAGNOSIS — L98499 Non-pressure chronic ulcer of skin of other sites with unspecified severity: Secondary | ICD-10-CM | POA: Diagnosis not present

## 2018-11-20 DIAGNOSIS — E7800 Pure hypercholesterolemia, unspecified: Secondary | ICD-10-CM | POA: Diagnosis not present

## 2018-11-20 DIAGNOSIS — Z0000 Encounter for general adult medical examination without abnormal findings: Secondary | ICD-10-CM | POA: Diagnosis not present

## 2018-11-20 DIAGNOSIS — G4700 Insomnia, unspecified: Secondary | ICD-10-CM | POA: Diagnosis not present

## 2018-11-20 DIAGNOSIS — R7303 Prediabetes: Secondary | ICD-10-CM | POA: Diagnosis not present

## 2018-11-20 DIAGNOSIS — I10 Essential (primary) hypertension: Secondary | ICD-10-CM | POA: Diagnosis not present

## 2018-11-20 DIAGNOSIS — M25562 Pain in left knee: Secondary | ICD-10-CM | POA: Diagnosis not present

## 2018-11-20 DIAGNOSIS — K219 Gastro-esophageal reflux disease without esophagitis: Secondary | ICD-10-CM | POA: Diagnosis not present

## 2018-11-20 DIAGNOSIS — D692 Other nonthrombocytopenic purpura: Secondary | ICD-10-CM | POA: Diagnosis not present

## 2018-11-20 DIAGNOSIS — Z6841 Body Mass Index (BMI) 40.0 and over, adult: Secondary | ICD-10-CM | POA: Diagnosis not present

## 2018-11-20 DIAGNOSIS — E039 Hypothyroidism, unspecified: Secondary | ICD-10-CM | POA: Diagnosis not present

## 2018-11-20 DIAGNOSIS — R609 Edema, unspecified: Secondary | ICD-10-CM | POA: Diagnosis not present

## 2018-11-21 DIAGNOSIS — R7303 Prediabetes: Secondary | ICD-10-CM | POA: Diagnosis not present

## 2018-11-21 DIAGNOSIS — I10 Essential (primary) hypertension: Secondary | ICD-10-CM | POA: Diagnosis not present

## 2018-11-21 DIAGNOSIS — E039 Hypothyroidism, unspecified: Secondary | ICD-10-CM | POA: Diagnosis not present

## 2018-11-21 DIAGNOSIS — E7800 Pure hypercholesterolemia, unspecified: Secondary | ICD-10-CM | POA: Diagnosis not present

## 2018-12-05 ENCOUNTER — Other Ambulatory Visit: Payer: Self-pay

## 2018-12-05 ENCOUNTER — Ambulatory Visit (INDEPENDENT_AMBULATORY_CARE_PROVIDER_SITE_OTHER): Payer: PPO | Admitting: Sports Medicine

## 2018-12-05 ENCOUNTER — Encounter: Payer: Self-pay | Admitting: Sports Medicine

## 2018-12-05 DIAGNOSIS — M170 Bilateral primary osteoarthritis of knee: Secondary | ICD-10-CM | POA: Diagnosis not present

## 2018-12-05 DIAGNOSIS — F4310 Post-traumatic stress disorder, unspecified: Secondary | ICD-10-CM | POA: Diagnosis not present

## 2018-12-05 NOTE — Progress Notes (Signed)
I connected withellen [name] 04/21/20at 2:30 PM EDTby a video enabled telemedicine applicationand verified that I am speaking with the correct person using two identifiers.  Location of patient/parent:At home  I discussed the limitations of evaluation and management by telemedicine and the availability of in person appointments. I discussed that the purpose of this phone visit is to provide medical care while limiting exposure to the novel coronavirus. Patient consented to visit. MD location: [facility] Office  HPI Chief complaint bilateral knee pain left worse than right  Patient has known grade 4 osteoarthritis of both knees She is followed by Dr. Ihor Gully who wants her to lose significant weight before he does knee replacements She has also been seen at [facility] for knee pain felt to be related to the arthritis  I had seen her in 2018.  She was making progress with a water exercise program and with weight loss. Her daughter had to undergo a lung replacement. The patient enrolled in the [facility] weight loss program while helping care for her daughter in [place].  She benefited from the calorie restriction and weight loss. However in September as she started their exercise classes she had 2 falls. Her knees have been much worse since that time particularly on the left.  Following the time off to help care for her daughter and with the current coronavirus issues she has not been able to continue her water exercises. She feels that she is getting progressively worse and contact me for advice.  Past history Arthrodesis of left ankle  Review of systems Patient gets locking in the left knee She gets daily swelling in both knees She feels that swelling and pain have increased in both lower legs She has consistent pain at night which makes it difficult to sleep She gets the best pain relief with 800 of ibuprofen twice a day and this worked better for her than tramadol or high dose  acetaminophen  Examination I was unable to perform a handsewn examination because of video visit Observation shows that the patient is alert and oriented She has an appropriate affect I did observe her ability to flex both knees past 90 degrees The knees look puffy on observation

## 2018-12-05 NOTE — Assessment & Plan Note (Signed)
I think her calorie restriction diet is helpful and she will continue  Need to be able to find dome degree of physical activity to help support weight loss

## 2018-12-05 NOTE — Assessment & Plan Note (Addendum)
Patient needs to restart conservative care to give herself better relief  Continue the ibuprofen 800 mg twice daily Start using ice more consistently -during the day for pain but each evening Restart some of the easy straight leg raises and home exercises that she has been taught  Try to find another recumbent bike that is comfortable and start with very easy riding and increase gradually Try some intermittent compression with an Ace wrap for about 20 minutes 3 times a day  When she is able to resume I would like her to restart her water exercise program I think she is a good candidate to work with supervised exercise and I suggested contacting Leatrice Jewels at [facility]  She is a candidate for knee replacement once she can do some pre-habilitation and hopefully lose more weight I think she has had reasonable advice from Dr. Alvan Dame and can seek a second opinion if desired

## 2018-12-18 DIAGNOSIS — M25562 Pain in left knee: Secondary | ICD-10-CM | POA: Diagnosis not present

## 2018-12-18 DIAGNOSIS — R600 Localized edema: Secondary | ICD-10-CM | POA: Diagnosis not present

## 2018-12-18 DIAGNOSIS — M1711 Unilateral primary osteoarthritis, right knee: Secondary | ICD-10-CM | POA: Diagnosis not present

## 2018-12-18 DIAGNOSIS — M170 Bilateral primary osteoarthritis of knee: Secondary | ICD-10-CM | POA: Diagnosis not present

## 2018-12-18 DIAGNOSIS — M1712 Unilateral primary osteoarthritis, left knee: Secondary | ICD-10-CM | POA: Diagnosis not present

## 2019-01-11 DIAGNOSIS — M1712 Unilateral primary osteoarthritis, left knee: Secondary | ICD-10-CM | POA: Diagnosis not present

## 2019-01-11 DIAGNOSIS — M1711 Unilateral primary osteoarthritis, right knee: Secondary | ICD-10-CM | POA: Diagnosis not present

## 2019-01-24 DIAGNOSIS — F4310 Post-traumatic stress disorder, unspecified: Secondary | ICD-10-CM | POA: Diagnosis not present

## 2019-02-07 DIAGNOSIS — F4310 Post-traumatic stress disorder, unspecified: Secondary | ICD-10-CM | POA: Diagnosis not present

## 2019-02-20 DIAGNOSIS — F4310 Post-traumatic stress disorder, unspecified: Secondary | ICD-10-CM | POA: Diagnosis not present

## 2019-02-27 DIAGNOSIS — F4310 Post-traumatic stress disorder, unspecified: Secondary | ICD-10-CM | POA: Diagnosis not present

## 2019-03-07 DIAGNOSIS — R609 Edema, unspecified: Secondary | ICD-10-CM | POA: Diagnosis not present

## 2019-03-08 ENCOUNTER — Ambulatory Visit: Payer: PPO | Admitting: Sports Medicine

## 2019-03-14 DIAGNOSIS — F4310 Post-traumatic stress disorder, unspecified: Secondary | ICD-10-CM | POA: Diagnosis not present

## 2019-03-20 DIAGNOSIS — F4310 Post-traumatic stress disorder, unspecified: Secondary | ICD-10-CM | POA: Diagnosis not present

## 2019-03-22 DIAGNOSIS — M1711 Unilateral primary osteoarthritis, right knee: Secondary | ICD-10-CM | POA: Diagnosis not present

## 2019-03-22 DIAGNOSIS — M1712 Unilateral primary osteoarthritis, left knee: Secondary | ICD-10-CM | POA: Diagnosis not present

## 2019-03-28 DIAGNOSIS — F4310 Post-traumatic stress disorder, unspecified: Secondary | ICD-10-CM | POA: Diagnosis not present

## 2019-04-05 DIAGNOSIS — I10 Essential (primary) hypertension: Secondary | ICD-10-CM | POA: Diagnosis not present

## 2019-04-05 DIAGNOSIS — M8580 Other specified disorders of bone density and structure, unspecified site: Secondary | ICD-10-CM | POA: Diagnosis not present

## 2019-04-05 DIAGNOSIS — E7800 Pure hypercholesterolemia, unspecified: Secondary | ICD-10-CM | POA: Diagnosis not present

## 2019-04-05 DIAGNOSIS — F324 Major depressive disorder, single episode, in partial remission: Secondary | ICD-10-CM | POA: Diagnosis not present

## 2019-04-05 DIAGNOSIS — F331 Major depressive disorder, recurrent, moderate: Secondary | ICD-10-CM | POA: Diagnosis not present

## 2019-04-05 DIAGNOSIS — E039 Hypothyroidism, unspecified: Secondary | ICD-10-CM | POA: Diagnosis not present

## 2019-04-12 DIAGNOSIS — F4310 Post-traumatic stress disorder, unspecified: Secondary | ICD-10-CM | POA: Diagnosis not present

## 2019-04-26 DIAGNOSIS — F4310 Post-traumatic stress disorder, unspecified: Secondary | ICD-10-CM | POA: Diagnosis not present

## 2019-05-10 DIAGNOSIS — F4310 Post-traumatic stress disorder, unspecified: Secondary | ICD-10-CM | POA: Diagnosis not present

## 2019-05-16 DIAGNOSIS — M8580 Other specified disorders of bone density and structure, unspecified site: Secondary | ICD-10-CM | POA: Diagnosis not present

## 2019-05-16 DIAGNOSIS — E039 Hypothyroidism, unspecified: Secondary | ICD-10-CM | POA: Diagnosis not present

## 2019-05-16 DIAGNOSIS — E7800 Pure hypercholesterolemia, unspecified: Secondary | ICD-10-CM | POA: Diagnosis not present

## 2019-05-16 DIAGNOSIS — I10 Essential (primary) hypertension: Secondary | ICD-10-CM | POA: Diagnosis not present

## 2019-05-16 DIAGNOSIS — F331 Major depressive disorder, recurrent, moderate: Secondary | ICD-10-CM | POA: Diagnosis not present

## 2019-05-16 DIAGNOSIS — F324 Major depressive disorder, single episode, in partial remission: Secondary | ICD-10-CM | POA: Diagnosis not present

## 2019-05-17 DIAGNOSIS — E2839 Other primary ovarian failure: Secondary | ICD-10-CM | POA: Diagnosis not present

## 2019-05-17 DIAGNOSIS — R609 Edema, unspecified: Secondary | ICD-10-CM | POA: Diagnosis not present

## 2019-05-17 DIAGNOSIS — M8580 Other specified disorders of bone density and structure, unspecified site: Secondary | ICD-10-CM | POA: Diagnosis not present

## 2019-05-17 DIAGNOSIS — F331 Major depressive disorder, recurrent, moderate: Secondary | ICD-10-CM | POA: Diagnosis not present

## 2019-05-17 DIAGNOSIS — E7800 Pure hypercholesterolemia, unspecified: Secondary | ICD-10-CM | POA: Diagnosis not present

## 2019-05-17 DIAGNOSIS — M25562 Pain in left knee: Secondary | ICD-10-CM | POA: Diagnosis not present

## 2019-05-17 DIAGNOSIS — I10 Essential (primary) hypertension: Secondary | ICD-10-CM | POA: Diagnosis not present

## 2019-05-17 DIAGNOSIS — R7303 Prediabetes: Secondary | ICD-10-CM | POA: Diagnosis not present

## 2019-05-17 DIAGNOSIS — K219 Gastro-esophageal reflux disease without esophagitis: Secondary | ICD-10-CM | POA: Diagnosis not present

## 2019-05-17 DIAGNOSIS — F419 Anxiety disorder, unspecified: Secondary | ICD-10-CM | POA: Diagnosis not present

## 2019-05-17 DIAGNOSIS — F900 Attention-deficit hyperactivity disorder, predominantly inattentive type: Secondary | ICD-10-CM | POA: Diagnosis not present

## 2019-05-17 DIAGNOSIS — E039 Hypothyroidism, unspecified: Secondary | ICD-10-CM | POA: Diagnosis not present

## 2019-05-22 DIAGNOSIS — M1711 Unilateral primary osteoarthritis, right knee: Secondary | ICD-10-CM | POA: Diagnosis not present

## 2019-05-31 ENCOUNTER — Other Ambulatory Visit: Payer: Self-pay | Admitting: Orthopedic Surgery

## 2019-06-06 DIAGNOSIS — M8580 Other specified disorders of bone density and structure, unspecified site: Secondary | ICD-10-CM | POA: Diagnosis not present

## 2019-06-06 DIAGNOSIS — F324 Major depressive disorder, single episode, in partial remission: Secondary | ICD-10-CM | POA: Diagnosis not present

## 2019-06-06 DIAGNOSIS — F331 Major depressive disorder, recurrent, moderate: Secondary | ICD-10-CM | POA: Diagnosis not present

## 2019-06-06 DIAGNOSIS — E039 Hypothyroidism, unspecified: Secondary | ICD-10-CM | POA: Diagnosis not present

## 2019-06-06 DIAGNOSIS — E7800 Pure hypercholesterolemia, unspecified: Secondary | ICD-10-CM | POA: Diagnosis not present

## 2019-06-06 DIAGNOSIS — I10 Essential (primary) hypertension: Secondary | ICD-10-CM | POA: Diagnosis not present

## 2019-06-07 DIAGNOSIS — F4310 Post-traumatic stress disorder, unspecified: Secondary | ICD-10-CM | POA: Diagnosis not present

## 2019-06-19 DIAGNOSIS — F4310 Post-traumatic stress disorder, unspecified: Secondary | ICD-10-CM | POA: Diagnosis not present

## 2019-06-21 DIAGNOSIS — Z23 Encounter for immunization: Secondary | ICD-10-CM | POA: Diagnosis not present

## 2019-06-22 DIAGNOSIS — M6281 Muscle weakness (generalized): Secondary | ICD-10-CM | POA: Diagnosis not present

## 2019-06-22 DIAGNOSIS — M25662 Stiffness of left knee, not elsewhere classified: Secondary | ICD-10-CM | POA: Diagnosis not present

## 2019-06-29 DIAGNOSIS — M25662 Stiffness of left knee, not elsewhere classified: Secondary | ICD-10-CM | POA: Diagnosis not present

## 2019-06-29 DIAGNOSIS — M6281 Muscle weakness (generalized): Secondary | ICD-10-CM | POA: Diagnosis not present

## 2019-07-05 DIAGNOSIS — M1711 Unilateral primary osteoarthritis, right knee: Secondary | ICD-10-CM | POA: Diagnosis not present

## 2019-07-06 DIAGNOSIS — F4310 Post-traumatic stress disorder, unspecified: Secondary | ICD-10-CM | POA: Diagnosis not present

## 2019-07-16 ENCOUNTER — Ambulatory Visit: Admit: 2019-07-16 | Payer: PPO | Admitting: Orthopedic Surgery

## 2019-07-16 SURGERY — ARTHROPLASTY, KNEE, TOTAL
Anesthesia: Spinal | Site: Knee | Laterality: Left

## 2019-07-20 DIAGNOSIS — F4310 Post-traumatic stress disorder, unspecified: Secondary | ICD-10-CM | POA: Diagnosis not present

## 2019-07-24 DIAGNOSIS — M25561 Pain in right knee: Secondary | ICD-10-CM | POA: Diagnosis not present

## 2019-07-24 DIAGNOSIS — M170 Bilateral primary osteoarthritis of knee: Secondary | ICD-10-CM | POA: Diagnosis not present

## 2019-07-24 DIAGNOSIS — Z6841 Body Mass Index (BMI) 40.0 and over, adult: Secondary | ICD-10-CM | POA: Diagnosis not present

## 2019-07-24 DIAGNOSIS — R262 Difficulty in walking, not elsewhere classified: Secondary | ICD-10-CM | POA: Diagnosis not present

## 2019-07-24 DIAGNOSIS — E669 Obesity, unspecified: Secondary | ICD-10-CM | POA: Diagnosis not present

## 2019-07-24 DIAGNOSIS — M25562 Pain in left knee: Secondary | ICD-10-CM | POA: Diagnosis not present

## 2019-07-24 DIAGNOSIS — F329 Major depressive disorder, single episode, unspecified: Secondary | ICD-10-CM | POA: Diagnosis not present

## 2019-08-15 DIAGNOSIS — M8580 Other specified disorders of bone density and structure, unspecified site: Secondary | ICD-10-CM | POA: Diagnosis not present

## 2019-08-15 DIAGNOSIS — I10 Essential (primary) hypertension: Secondary | ICD-10-CM | POA: Diagnosis not present

## 2019-08-15 DIAGNOSIS — E7800 Pure hypercholesterolemia, unspecified: Secondary | ICD-10-CM | POA: Diagnosis not present

## 2019-08-15 DIAGNOSIS — E039 Hypothyroidism, unspecified: Secondary | ICD-10-CM | POA: Diagnosis not present

## 2019-08-15 DIAGNOSIS — F331 Major depressive disorder, recurrent, moderate: Secondary | ICD-10-CM | POA: Diagnosis not present

## 2019-08-15 DIAGNOSIS — F324 Major depressive disorder, single episode, in partial remission: Secondary | ICD-10-CM | POA: Diagnosis not present

## 2019-08-21 DIAGNOSIS — F4310 Post-traumatic stress disorder, unspecified: Secondary | ICD-10-CM | POA: Diagnosis not present

## 2019-08-22 DIAGNOSIS — E7800 Pure hypercholesterolemia, unspecified: Secondary | ICD-10-CM | POA: Diagnosis not present

## 2019-08-22 DIAGNOSIS — F329 Major depressive disorder, single episode, unspecified: Secondary | ICD-10-CM | POA: Diagnosis not present

## 2019-08-22 DIAGNOSIS — Z6841 Body Mass Index (BMI) 40.0 and over, adult: Secondary | ICD-10-CM | POA: Diagnosis not present

## 2019-08-22 DIAGNOSIS — M8580 Other specified disorders of bone density and structure, unspecified site: Secondary | ICD-10-CM | POA: Diagnosis not present

## 2019-08-22 DIAGNOSIS — F324 Major depressive disorder, single episode, in partial remission: Secondary | ICD-10-CM | POA: Diagnosis not present

## 2019-08-22 DIAGNOSIS — M170 Bilateral primary osteoarthritis of knee: Secondary | ICD-10-CM | POA: Diagnosis not present

## 2019-08-22 DIAGNOSIS — M25562 Pain in left knee: Secondary | ICD-10-CM | POA: Diagnosis not present

## 2019-08-22 DIAGNOSIS — I10 Essential (primary) hypertension: Secondary | ICD-10-CM | POA: Diagnosis not present

## 2019-08-22 DIAGNOSIS — R262 Difficulty in walking, not elsewhere classified: Secondary | ICD-10-CM | POA: Diagnosis not present

## 2019-08-22 DIAGNOSIS — M25561 Pain in right knee: Secondary | ICD-10-CM | POA: Diagnosis not present

## 2019-08-22 DIAGNOSIS — E669 Obesity, unspecified: Secondary | ICD-10-CM | POA: Diagnosis not present

## 2019-08-22 DIAGNOSIS — F331 Major depressive disorder, recurrent, moderate: Secondary | ICD-10-CM | POA: Diagnosis not present

## 2019-08-22 DIAGNOSIS — E039 Hypothyroidism, unspecified: Secondary | ICD-10-CM | POA: Diagnosis not present

## 2019-08-23 DIAGNOSIS — M1712 Unilateral primary osteoarthritis, left knee: Secondary | ICD-10-CM | POA: Diagnosis not present

## 2019-08-23 DIAGNOSIS — M1711 Unilateral primary osteoarthritis, right knee: Secondary | ICD-10-CM | POA: Diagnosis not present

## 2019-09-03 DIAGNOSIS — F4310 Post-traumatic stress disorder, unspecified: Secondary | ICD-10-CM | POA: Diagnosis not present

## 2019-09-04 DIAGNOSIS — M1712 Unilateral primary osteoarthritis, left knee: Secondary | ICD-10-CM | POA: Diagnosis not present

## 2019-09-04 DIAGNOSIS — M1711 Unilateral primary osteoarthritis, right knee: Secondary | ICD-10-CM | POA: Diagnosis not present

## 2019-09-06 ENCOUNTER — Ambulatory Visit: Payer: Medicare Other | Attending: Internal Medicine

## 2019-09-06 DIAGNOSIS — Z23 Encounter for immunization: Secondary | ICD-10-CM | POA: Insufficient documentation

## 2019-09-06 NOTE — Progress Notes (Signed)
   Covid-19 Vaccination Clinic  Name:  [name] [name]    MRN: [id number] DOB: [birth year]  09/06/2019  [name] [name] was observed post Covid-19 immunization for 15 minutes without incidence. She was provided with Vaccine Information Sheet and instruction to access the V-Safe system.   [name] [name] was instructed to call 911 with any severe reactions post vaccine: Marland Kitchen Difficulty breathing  . Swelling of your face and throat  . A fast heartbeat  . A bad rash all over your body  . Dizziness and weakness    Immunizations Administered    Name Date Dose VIS Date Route   Pfizer COVID-19 Vaccine 09/06/2019 12:48 PM 0.3 mL 07/27/2019 Intramuscular   Manufacturer: [place]   Lot: [id number]   [place]: [id number]

## 2019-09-17 DIAGNOSIS — F4310 Post-traumatic stress disorder, unspecified: Secondary | ICD-10-CM | POA: Diagnosis not present

## 2019-09-27 ENCOUNTER — Ambulatory Visit: Payer: PPO | Attending: Internal Medicine

## 2019-09-27 DIAGNOSIS — E7800 Pure hypercholesterolemia, unspecified: Secondary | ICD-10-CM | POA: Diagnosis not present

## 2019-09-27 DIAGNOSIS — M8580 Other specified disorders of bone density and structure, unspecified site: Secondary | ICD-10-CM | POA: Diagnosis not present

## 2019-09-27 DIAGNOSIS — Z23 Encounter for immunization: Secondary | ICD-10-CM | POA: Insufficient documentation

## 2019-09-27 DIAGNOSIS — I10 Essential (primary) hypertension: Secondary | ICD-10-CM | POA: Diagnosis not present

## 2019-09-27 DIAGNOSIS — F324 Major depressive disorder, single episode, in partial remission: Secondary | ICD-10-CM | POA: Diagnosis not present

## 2019-09-27 DIAGNOSIS — F331 Major depressive disorder, recurrent, moderate: Secondary | ICD-10-CM | POA: Diagnosis not present

## 2019-09-27 DIAGNOSIS — E039 Hypothyroidism, unspecified: Secondary | ICD-10-CM | POA: Diagnosis not present

## 2019-09-27 NOTE — Progress Notes (Signed)
   Covid-19 Vaccination Clinic  Name:  [name] [name]    MRN: [id number] DOB: 1951-01-11  09/27/2019  [name] [name] was observed post Covid-19 immunization for 15 minutes without incidence. She was provided with Vaccine Information Sheet and instruction to access the V-Safe system.   [name] [name] was instructed to call 911 with any severe reactions post vaccine: Marland Kitchen Difficulty breathing  . Swelling of your face and throat  . A fast heartbeat  . A bad rash all over your body  . Dizziness and weakness    Immunizations Administered    Name Date Dose VIS Date Route   Pfizer COVID-19 Vaccine 09/27/2019  1:45 PM 0.3 mL 07/27/2019 Intramuscular   Manufacturer: [place]   Lot: [id number]   [place]: [id number]

## 2019-10-01 DIAGNOSIS — F4310 Post-traumatic stress disorder, unspecified: Secondary | ICD-10-CM | POA: Diagnosis not present

## 2019-10-22 DIAGNOSIS — F4310 Post-traumatic stress disorder, unspecified: Secondary | ICD-10-CM | POA: Diagnosis not present

## 2019-11-05 DIAGNOSIS — F4310 Post-traumatic stress disorder, unspecified: Secondary | ICD-10-CM | POA: Diagnosis not present

## 2019-11-14 DIAGNOSIS — E7800 Pure hypercholesterolemia, unspecified: Secondary | ICD-10-CM | POA: Diagnosis not present

## 2019-11-14 DIAGNOSIS — G4700 Insomnia, unspecified: Secondary | ICD-10-CM | POA: Diagnosis not present

## 2019-11-14 DIAGNOSIS — F324 Major depressive disorder, single episode, in partial remission: Secondary | ICD-10-CM | POA: Diagnosis not present

## 2019-11-14 DIAGNOSIS — M8580 Other specified disorders of bone density and structure, unspecified site: Secondary | ICD-10-CM | POA: Diagnosis not present

## 2019-11-14 DIAGNOSIS — E039 Hypothyroidism, unspecified: Secondary | ICD-10-CM | POA: Diagnosis not present

## 2019-11-14 DIAGNOSIS — F331 Major depressive disorder, recurrent, moderate: Secondary | ICD-10-CM | POA: Diagnosis not present

## 2019-11-14 DIAGNOSIS — I10 Essential (primary) hypertension: Secondary | ICD-10-CM | POA: Diagnosis not present

## 2019-11-20 DIAGNOSIS — F4310 Post-traumatic stress disorder, unspecified: Secondary | ICD-10-CM | POA: Diagnosis not present

## 2019-11-23 ENCOUNTER — Emergency Department (HOSPITAL_COMMUNITY)
Admission: EM | Admit: 2019-11-23 | Discharge: 2019-11-24 | Disposition: A | Discharge status: Home / Self Care | Payer: PPO | Attending: Emergency Medicine | Admitting: Emergency Medicine

## 2019-11-23 ENCOUNTER — Other Ambulatory Visit: Payer: Self-pay

## 2019-11-23 ENCOUNTER — Other Ambulatory Visit: Payer: Self-pay | Admitting: Orthopedic Surgery

## 2019-11-23 DIAGNOSIS — Z5321 Procedure and treatment not carried out due to patient leaving prior to being seen by health care provider: Secondary | ICD-10-CM | POA: Diagnosis not present

## 2019-11-23 DIAGNOSIS — M25561 Pain in right knee: Secondary | ICD-10-CM | POA: Diagnosis not present

## 2019-11-23 NOTE — ED Notes (Signed)
Pt is going to leave. Charge called, nothing can be done at this time. Wants DVT study, but staff went home for the night. Will return with appointment tomorrow.

## 2019-11-23 NOTE — ED Triage Notes (Signed)
C/o R knee pain and swelling and Right lower leg edema x 4 days.  States she is supposed to have Knee replacement.  She was concerned that she may have a DVT.  Pain is on anterior leg.

## 2019-11-24 ENCOUNTER — Ambulatory Visit (HOSPITAL_COMMUNITY)
Admission: RE | Admit: 2019-11-24 | Discharge: 2019-11-24 | Disposition: A | Discharge status: Home / Self Care | Payer: PPO | Source: Ambulatory Visit | Attending: Orthopedic Surgery | Admitting: Orthopedic Surgery

## 2019-11-24 ENCOUNTER — Other Ambulatory Visit: Payer: Self-pay

## 2019-11-24 ENCOUNTER — Other Ambulatory Visit (HOSPITAL_COMMUNITY): Payer: Self-pay | Admitting: Orthopedic Surgery

## 2019-11-24 DIAGNOSIS — R52 Pain, unspecified: Secondary | ICD-10-CM

## 2019-11-24 NOTE — Progress Notes (Signed)
VASCULAR LAB PRELIMINARY  PRELIMINARY  PRELIMINARY  PRELIMINARY  Bilateral lower extremity venous duplex completed.    Preliminary report:  See CV proc for preliminary results.  Called [phone]  With report  Sharion Dove, RVT 11/24/2019, 2:03 PM

## 2019-11-24 NOTE — ED Notes (Signed)
Pt left. 

## 2019-11-26 DIAGNOSIS — K219 Gastro-esophageal reflux disease without esophagitis: Secondary | ICD-10-CM | POA: Diagnosis not present

## 2019-11-26 DIAGNOSIS — G4700 Insomnia, unspecified: Secondary | ICD-10-CM | POA: Diagnosis not present

## 2019-11-26 DIAGNOSIS — I10 Essential (primary) hypertension: Secondary | ICD-10-CM | POA: Diagnosis not present

## 2019-11-26 DIAGNOSIS — F900 Attention-deficit hyperactivity disorder, predominantly inattentive type: Secondary | ICD-10-CM | POA: Diagnosis not present

## 2019-11-26 DIAGNOSIS — Z23 Encounter for immunization: Secondary | ICD-10-CM | POA: Diagnosis not present

## 2019-11-26 DIAGNOSIS — F331 Major depressive disorder, recurrent, moderate: Secondary | ICD-10-CM | POA: Diagnosis not present

## 2019-11-26 DIAGNOSIS — E7800 Pure hypercholesterolemia, unspecified: Secondary | ICD-10-CM | POA: Diagnosis not present

## 2019-11-26 DIAGNOSIS — N390 Urinary tract infection, site not specified: Secondary | ICD-10-CM | POA: Diagnosis not present

## 2019-11-26 DIAGNOSIS — R7309 Other abnormal glucose: Secondary | ICD-10-CM | POA: Diagnosis not present

## 2019-11-26 DIAGNOSIS — D692 Other nonthrombocytopenic purpura: Secondary | ICD-10-CM | POA: Diagnosis not present

## 2019-11-26 DIAGNOSIS — Z0000 Encounter for general adult medical examination without abnormal findings: Secondary | ICD-10-CM | POA: Diagnosis not present

## 2019-11-26 DIAGNOSIS — E039 Hypothyroidism, unspecified: Secondary | ICD-10-CM | POA: Diagnosis not present

## 2019-12-03 DIAGNOSIS — F4310 Post-traumatic stress disorder, unspecified: Secondary | ICD-10-CM | POA: Diagnosis not present

## 2019-12-05 NOTE — Patient Instructions (Addendum)
DUE TO COVID-19 ONLY ONE VISITOR IS ALLOWED TO COME WITH YOU AND STAY IN THE WAITING ROOM ONLY DURING PRE OP AND PROCEDURE DAY OF SURGERY. THE 1 VISITOR MAY VISIT WITH YOU AFTER SURGERY IN YOUR PRIVATE ROOM DURING VISITING HOURS ONLY!  YOU NEED TO HAVE A COVID 19 TEST ON: 12/13/19 @ 9:00 am , THIS TEST MUST BE DONE BEFORE SURGERY, COME  [place], [address] , [id number].  ([place]) ONCE YOUR COVID TEST IS COMPLETED, PLEASE BEGIN THE QUARANTINE INSTRUCTIONS AS OUTLINED IN YOUR HANDOUT.                [name]     Your procedure is scheduled on: 12/17/19   Report to [facility] Main  Entrance   Report to admitting at: 7:00 AM     Call this number if you have problems the morning of surgery [phone]    Remember:    [place], NO [place].     Take these medicines the morning of surgery with A SIP OF WATER: BUPROPION,DULOXETINE,LEVOTHYROXINE,VYVANSE,OMEPRAZOLE.                                 You may not have any metal on your body including hair pins and              piercings  Do not wear jewelry, make-up, lotions, powders or perfumes, deodorant             Do not wear nail polish on your fingernails.  Do not shave  48 hours prior to surgery.       .   Do not bring valuables to the hospital. [place].  Contacts, dentures or bridgework may not be worn into surgery.  Leave suitcase in the car. After surgery it may be brought to your room.     Patients discharged the day of surgery will not be allowed to drive home. IF YOU ARE HAVING SURGERY AND GOING HOME THE SAME DAY, YOU MUST HAVE AN ADULT TO DRIVE YOU HOME AND BE WITH YOU FOR 24 HOURS. YOU MAY GO HOME BY TAXI OR UBER OR ORTHERWISE, BUT AN ADULT MUST ACCOMPANY YOU HOME AND STAY WITH YOU FOR 24 HOURS.  Name and phone number of your driver:  Special Instructions: N/A           Please read over the following fact sheets you were given: _____________________________________________________________________             NO SOLID FOOD AFTER MIDNIGHT THE NIGHT PRIOR TO SURGERY. NOTHING BY MOUTH EXCEPT CLEAR LIQUIDS UNTIL: 6:30 AM . PLEASE FINISH ENSURE DRINK PER SURGEON ORDER  WHICH NEEDS TO BE COMPLETED AT: 6:30 AM .   CLEAR LIQUID DIET   Foods Allowed                                                                     Foods Excluded  Coffee and tea, regular and decaf  liquids that you cannot  Plain Jell-O any favor except red or purple                                           see through such as: Fruit ices (not with fruit pulp)                                     milk, soups, orange juice  Iced Popsicles                                    All solid food Carbonated beverages, regular and diet                                    Cranberry, grape and apple juices Sports drinks like Gatorade Lightly seasoned clear broth or consume(fat free) Sugar, honey syrup  Sample Menu Breakfast                                Lunch                                     Supper Cranberry juice                    Beef broth                            Chicken broth Jell-O                                     Grape juice                           Apple juice Coffee or tea                        Jell-O                                      Popsicle                                                Coffee or tea                        Coffee or tea  _____________________________________________________________________  [facility] - Preparing for Surgery Before surgery, you can play an important role.  Because skin is not sterile, your skin needs to be as free of germs as possible.  You can reduce the number of germs on your skin by washing with CHG (chlorahexidine gluconate) soap before surgery.  CHG is an antiseptic cleaner which kills  germs and bonds  with the skin to continue killing germs even after washing. Please DO NOT use if you have an allergy to CHG or antibacterial soaps.  If your skin becomes reddened/irritated stop using the CHG and inform your nurse when you arrive at Short Stay. Do not shave (including legs and underarms) for at least 48 hours prior to the first CHG shower.  You may shave your face/neck. Please follow these instructions carefully:  1.  Shower with CHG Soap the night before surgery and the  morning of Surgery.  2.  If you choose to wash your hair, wash your hair first as usual with your  normal  shampoo.  3.  After you shampoo, rinse your hair and body thoroughly to remove the  shampoo.                           4.  Use CHG as you would any other liquid soap.  You can apply chg directly  to the skin and wash                       Gently with a scrungie or clean washcloth.  5.  Apply the CHG Soap to your body ONLY FROM THE NECK DOWN.   Do not use on face/ open                           Wound or open sores. Avoid contact with eyes, ears mouth and genitals (private parts).                       Wash face,  Genitals (private parts) with your normal soap.             6.  Wash thoroughly, paying special attention to the area where your surgery  will be performed.  7.  Thoroughly rinse your body with warm water from the neck down.  8.  DO NOT shower/wash with your normal soap after using and rinsing off  the CHG Soap.                9.  Pat yourself dry with a clean towel.            10.  Wear clean pajamas.            11.  Place clean sheets on your bed the night of your first shower and do not  sleep with pets. Day of Surgery : Do not apply any lotions/deodorants the morning of surgery.  Please wear clean clothes to the hospital/surgery center.  FAILURE TO FOLLOW THESE INSTRUCTIONS MAY RESULT IN THE CANCELLATION OF YOUR SURGERY PATIENT SIGNATURE_________________________________  NURSE  SIGNATURE__________________________________  ________________________________________________________________________   Adam Phenix  An incentive spirometer is a tool that can help keep your lungs clear and active. This tool measures how well you are filling your lungs with each breath. Taking long deep breaths may help reverse or decrease the chance of developing breathing (pulmonary) problems (especially infection) following:  A long period of time when you are unable to move or be active. BEFORE THE PROCEDURE   If the spirometer includes an indicator to show your best effort, your nurse or respiratory therapist will set it to a desired goal.  If possible, sit up straight or lean slightly forward. Try not to slouch.  Hold the incentive spirometer in an  upright position. INSTRUCTIONS FOR USE  1. Sit on the edge of your bed if possible, or sit up as far as you can in bed or on a chair. 2. Hold the incentive spirometer in an upright position. 3. Breathe out normally. 4. Place the mouthpiece in your mouth and seal your lips tightly around it. 5. Breathe in slowly and as deeply as possible, raising the piston or the ball toward the top of the column. 6. Hold your breath for 3-5 seconds or for as long as possible. Allow the piston or ball to fall to the bottom of the column. 7. Remove the mouthpiece from your mouth and breathe out normally. 8. Rest for a few seconds and repeat Steps 1 through 7 at least 10 times every 1-2 hours when you are awake. Take your time and take a few normal breaths between deep breaths. 9. The spirometer may include an indicator to show your best effort. Use the indicator as a goal to work toward during each repetition. 10. After each set of 10 deep breaths, practice coughing to be sure your lungs are clear. If you have an incision (the cut made at the time of surgery), support your incision when coughing by placing a pillow or rolled up towels firmly  against it. Once you are able to get out of bed, walk around indoors and cough well. You may stop using the incentive spirometer when instructed by your caregiver.  RISKS AND COMPLICATIONS  Take your time so you do not get dizzy or light-headed.  If you are in pain, you may need to take or ask for pain medication before doing incentive spirometry. It is harder to take a deep breath if you are having pain. AFTER USE  Rest and breathe slowly and easily.  It can be helpful to keep track of a log of your progress. Your caregiver can provide you with a simple table to help with this. If you are using the spirometer at home, follow these instructions: [place] IF:   You are having difficultly using the spirometer.  You have trouble using the spirometer as often as instructed.  Your pain medication is not giving enough relief while using the spirometer.  You develop fever of 100.5 F (38.1 C) or higher. SEEK IMMEDIATE MEDICAL CARE IF:   You cough up bloody sputum that had not been present before.  You develop fever of 102 F (38.9 C) or greater.  You develop worsening pain at or near the incision site. MAKE SURE YOU:   Understand these instructions.  Will watch your condition.  Will get help right away if you are not doing well or get worse. Document Released: 12/13/2006 Document Revised: 10/25/2011 Document Reviewed: 02/13/2007 [facility] Patient Information 2014 [place], [place].   ________________________________________________________________________

## 2019-12-06 ENCOUNTER — Other Ambulatory Visit: Payer: Self-pay

## 2019-12-06 ENCOUNTER — Encounter (HOSPITAL_COMMUNITY): Payer: Self-pay

## 2019-12-06 ENCOUNTER — Encounter (HOSPITAL_COMMUNITY)
Admission: RE | Admit: 2019-12-06 | Discharge: 2019-12-06 | Disposition: A | Discharge status: Home / Self Care | Payer: PPO | Source: Ambulatory Visit | Attending: Orthopedic Surgery | Admitting: Orthopedic Surgery

## 2019-12-06 DIAGNOSIS — Z01818 Encounter for other preprocedural examination: Secondary | ICD-10-CM | POA: Diagnosis not present

## 2019-12-06 NOTE — Progress Notes (Signed)
PCP - Dr. Lenard Simmer. LOV: 12/05/18 Cardiologist -   Chest x-ray -  EKG -  Stress Test -  ECHO -  Cardiac Cath -   Sleep Study -  CPAP -   Fasting Blood Sugar -  Checks Blood Sugar _____ times a day  Blood Thinner Instructions: Aspirin Instructions: Last Dose:  Anesthesia review:   Patient denies shortness of breath, fever, cough and chest pain at PAT appointment   Patient verbalized understanding of instructions that were given to them at the PAT appointment. Patient was also instructed that they will need to review over the PAT instructions again at home before surgery.

## 2019-12-13 ENCOUNTER — Encounter (HOSPITAL_COMMUNITY)
Admission: RE | Admit: 2019-12-13 | Discharge: 2019-12-13 | Disposition: A | Discharge status: Home / Self Care | Payer: PPO | Source: Ambulatory Visit | Attending: Orthopedic Surgery | Admitting: Orthopedic Surgery

## 2019-12-13 ENCOUNTER — Other Ambulatory Visit: Payer: Self-pay

## 2019-12-13 ENCOUNTER — Other Ambulatory Visit (HOSPITAL_COMMUNITY)
Admission: RE | Admit: 2019-12-13 | Discharge: 2019-12-13 | Disposition: A | Discharge status: Home / Self Care | Payer: PPO | Source: Ambulatory Visit | Attending: Orthopedic Surgery | Admitting: Orthopedic Surgery

## 2019-12-13 ENCOUNTER — Ambulatory Visit (HOSPITAL_COMMUNITY)
Admission: RE | Admit: 2019-12-13 | Discharge: 2019-12-13 | Disposition: A | Discharge status: Home / Self Care | Payer: PPO | Source: Ambulatory Visit | Attending: Orthopedic Surgery | Admitting: Orthopedic Surgery

## 2019-12-13 DIAGNOSIS — Z01818 Encounter for other preprocedural examination: Secondary | ICD-10-CM

## 2019-12-13 DIAGNOSIS — R9431 Abnormal electrocardiogram [ECG] [EKG]: Secondary | ICD-10-CM | POA: Insufficient documentation

## 2019-12-13 DIAGNOSIS — Z20822 Contact with and (suspected) exposure to covid-19: Secondary | ICD-10-CM | POA: Diagnosis not present

## 2019-12-13 DIAGNOSIS — Z96651 Presence of right artificial knee joint: Secondary | ICD-10-CM | POA: Diagnosis not present

## 2019-12-13 LAB — URINALYSIS, ROUTINE W REFLEX MICROSCOPIC
Bilirubin Urine: NEGATIVE
Glucose, UA: NEGATIVE mg/dL
Hgb urine dipstick: NEGATIVE
Ketones, ur: 5 mg/dL — AB
Leukocytes,Ua: NEGATIVE
Nitrite: NEGATIVE
Protein, ur: NEGATIVE mg/dL
Specific Gravity, Urine: 1.011 (ref 1.005–1.030)
pH: 5 (ref 5.0–8.0)

## 2019-12-13 LAB — BASIC METABOLIC PANEL
Anion gap: 10 (ref 5–15)
BUN: 17 mg/dL (ref 8–23)
CO2: 26 mmol/L (ref 22–32)
Calcium: 9.7 mg/dL (ref 8.9–10.3)
Chloride: 95 mmol/L — ABNORMAL LOW (ref 98–111)
Creatinine, Ser: 0.8 mg/dL (ref 0.44–1.00)
GFR calc Af Amer: >60 mL/min (ref >60)
GFR calc non Af Amer: >60 mL/min (ref >60)
Glucose, Bld: 86 mg/dL (ref 70–99)
Potassium: 4.5 mmol/L (ref 3.5–5.1)
Sodium: 131 mmol/L — ABNORMAL LOW (ref 135–145)

## 2019-12-13 LAB — CBC WITH DIFFERENTIAL/PLATELET
Abs Immature Granulocytes: 0.02 10*3/uL (ref 0.00–0.07)
Basophils Absolute: 0.1 10*3/uL (ref 0.0–0.1)
Basophils Relative: 1 %
Eosinophils Absolute: 0.2 10*3/uL (ref 0.0–0.5)
Eosinophils Relative: 3 %
HCT: 39.8 % (ref 36.0–46.0)
Hemoglobin: 12.7 g/dL (ref 12.0–15.0)
Immature Granulocytes: 0 %
Lymphocytes Relative: 23 %
Lymphs Abs: 1.7 10*3/uL (ref 0.7–4.0)
MCH: 28.2 pg (ref 26.0–34.0)
MCHC: 31.9 g/dL (ref 30.0–36.0)
MCV: 88.2 fL (ref 80.0–100.0)
Monocytes Absolute: 1 10*3/uL (ref 0.1–1.0)
Monocytes Relative: 13 %
Neutro Abs: 4.4 10*3/uL (ref 1.7–7.7)
Neutrophils Relative %: 60 %
Platelets: 431 10*3/uL — ABNORMAL HIGH (ref 150–400)
RBC: 4.51 MIL/uL (ref 3.87–5.11)
RDW: 13.4 % (ref 11.5–15.5)
WBC: 7.3 10*3/uL (ref 4.0–10.5)
nRBC: 0 % (ref 0.0–0.2)

## 2019-12-13 LAB — PROTIME-INR
INR: 1 (ref 0.8–1.2)
Prothrombin Time: 12.7 seconds (ref 11.4–15.2)

## 2019-12-13 LAB — SURGICAL PCR SCREEN
MRSA, PCR: NEGATIVE
Staphylococcus aureus: NEGATIVE

## 2019-12-13 LAB — ABO/RH: ABO/RH(D): A POS

## 2019-12-13 LAB — APTT: aPTT: 28 seconds (ref 24–36)

## 2019-12-13 LAB — SARS CORONAVIRUS 2 (TAT 6-24 HRS): SARS Coronavirus 2: NEGATIVE

## 2019-12-14 DIAGNOSIS — M1711 Unilateral primary osteoarthritis, right knee: Secondary | ICD-10-CM | POA: Diagnosis present

## 2019-12-14 NOTE — H&P (Signed)
TOTAL KNEE ADMISSION H&P  Patient is being admitted for right total knee arthroplasty.  Subjective:  Chief Complaint:right knee pain.  HPI: [name] [name], 69 y.o. female, has a history of pain and functional disability in the right knee due to arthritis and has failed non-surgical conservative treatments for greater than 12 weeks to includeNSAID's and/or analgesics, corticosteriod injections, use of assistive devices, weight reduction as appropriate and activity modification.  Onset of symptoms was gradual, starting several years ago with gradually worsening course since that time. The patient noted no past surgery on the right knee(s).  Patient currently rates pain in the right knee(s) at 10 out of 10 with activity. Patient has night pain, worsening of pain with activity and weight bearing, pain that interferes with activities of daily living, pain with passive range of motion, crepitus and joint swelling.  Patient has evidence of joint space narrowing by imaging studies.  There is no active infection.  Patient Active Problem List   Diagnosis Date Noted  . Obesity, morbid ([place]) 12/16/2016  . Strain of iliopsoas muscle, initial encounter 07/15/2016  . Elevated cholesterol   . Urinary incontinence   . Endometrial polyp   . Primary osteoarthritis of knees, bilateral 05/15/2009  . DERANGEMENT OF ANTERIOR HORN OF LATERAL MENISCUS 05/15/2009  . KNEE PAIN 05/15/2009   Past Medical History:  Diagnosis Date  . ADHD (attention deficit hyperactivity disorder)   . Anxiety   . Arthritis   . Depression   . GERD (gastroesophageal reflux disease)   . History of panic attacks   . Hyperlipidemia   . Hypertension   . Hypothyroidism, postsurgical   . SUI (stress urinary incontinence, female)   . Wears glasses     Past Surgical History:  Procedure Laterality Date  . D & C HYSTEROSCOPY W/ RESECTION ENDOMETRIAL POLYP  04/15/2009  . D & C HYSTEROSCOPY W/ RESECTOSCOPE AND THERMACHOICE ENDOMETRIAL  ABLATION  12/09/2010  . PUBOVAGINAL SLING N/A 07/26/2016   Procedure: PUBO-VAGINAL SLING ALTIS SLING;  Surgeon: Carolan Clines, MD;  Location: [facility];  Service: Urology;  Laterality: N/A;  . RESECTION GIANT CELL TUMOR RIGHT LONG FINGER  09/13/2008  . THYROIDECTOMY  1979    [facility]   non-malignant diseased thyroid  . TRIPLE ARTHRODESIS LEFT FOOT  04/20/2007  . VENTRAL HERNIA REPAIR  2003  . WRIST GANGLION EXCISION Right 1975    No current facility-administered medications for this encounter.   Current Outpatient Medications  Medication Sig Dispense Refill Last Dose  . ALPRAZolam (XANAX) 0.5 MG tablet Take 0.5 mg by mouth daily as needed for anxiety or sleep.      Marland Kitchen buPROPion (WELLBUTRIN XL) 300 MG 24 hr tablet Take 300 mg by mouth daily.   2   . cyclobenzaprine (FLEXERIL) 10 MG tablet Take 10 mg by mouth at bedtime.     . DULoxetine (CYMBALTA) 60 MG capsule Take 120 mg by mouth daily.   2   . ibuprofen (ADVIL) 200 MG tablet Take 400-600 mg by mouth 3 (three) times daily as needed (for pain.).      Marland Kitchen irbesartan (AVAPRO) 150 MG tablet Take 150 mg by mouth daily.     Marland Kitchen levothyroxine (SYNTHROID, LEVOTHROID) 125 MCG tablet Take 125 mcg by mouth daily before breakfast.   0   . lisdexamfetamine (VYVANSE) 50 MG capsule Take 50 mg by mouth daily.      Marland Kitchen omeprazole (PRILOSEC) 20 MG capsule Take 20 mg by mouth daily before breakfast.      .  OVER THE COUNTER MEDICATION Apply 1 application topically daily as needed (pain). Magnesium topical cream     . simvastatin (ZOCOR) 40 MG tablet Take 40 mg by mouth daily.   2   . torsemide (DEMADEX) 10 MG tablet Take 10 mg by mouth daily as needed (swelling).      . traMADol (ULTRAM) 50 MG tablet Take 1 tablet (50 mg total) by mouth every 6 (six) hours as needed. (Patient taking differently: Take 50-100 mg by mouth 2 (two) times daily as needed (pain). ) 50 tablet 2    Allergies  Allergen Reactions  . Ceclor [Cefaclor] Hives and Shortness  Of Breath  . Bactrim [Sulfamethoxazole-Trimethoprim] Hives  . Gabapentin Swelling    Social History   Tobacco Use  . Smoking status: Former Smoker    Years: 6.00    Types: Cigarettes    Quit date: 09/21/1974    Years since quitting: 45.2  . Smokeless tobacco: Never Used  Substance Use Topics  . Alcohol use: Yes    Comment: very rare    Family History  Problem Relation Age of Onset  . Breast cancer Mother   . Uterine cancer Mother   . Heart disease Paternal Grandfather   . Diabetes Maternal Grandfather      Review of Systems  Constitutional: Positive for fatigue.  HENT: Negative.   Eyes: Negative.   Cardiovascular:       HTN  Gastrointestinal: Negative.   Endocrine: Negative.   Genitourinary:       Poor bladder control  Musculoskeletal: Positive for arthralgias and joint swelling.  Skin: Negative.   Allergic/Immunologic: Negative.   Neurological: Negative.   Hematological: Bruises/bleeds easily.  Psychiatric/Behavioral: The patient is nervous/anxious.     Objective:  Physical Exam  Constitutional: She is oriented to person, place, and time. She appears well-developed and well-nourished.  HENT:  Head: Normocephalic and atraumatic.  Eyes: Pupils are equal, round, and reactive to light.  Cardiovascular: Intact distal pulses.  Respiratory: Effort normal.  Musculoskeletal:        General: Tenderness present.     Cervical back: Normal range of motion and neck supple.     Comments: the patient has a range from roughly 0-110.  No instability.  She does have valgus deformities bilaterally.  Calves are soft and nontender.  Tenderness over both the medial lateral joint lines.  Neurological: She is alert and oriented to person, place, and time.  Skin: Skin is warm and dry.  Psychiatric: She has a normal mood and affect. Her behavior is normal. Judgment and thought content normal.    Vital signs in last 24 hours:    Labs:   Estimated body mass index is 38.98 kg/m  as calculated from the following:   Height as of 12/13/19: 5' 3.5" (1.613 m).   Weight as of 12/13/19: 101.4 kg.   Imaging Review Plain radiographs demonstrate bilateral AP weightbearing, bilateral Rosenberg, lateral sunrise views of bilateral knees are taken and reviewed in office today.  Patient has end-stage arthritis lateral compartment bone-on-bone bilateral knees and end-stage arthritis patellofemoral compartment right knee with periarticular osteophyte formation.   Assessment/Plan:  End stage arthritis, right knee   The patient history, physical examination, clinical judgment of the provider and imaging studies are consistent with end stage degenerative joint disease of the right knee(s) and total knee arthroplasty is deemed medically necessary. The treatment options including medical management, injection therapy arthroscopy and arthroplasty were discussed at length. The risks and benefits of total  knee arthroplasty were presented and reviewed. The risks due to aseptic loosening, infection, stiffness, patella tracking problems, thromboembolic complications and other imponderables were discussed. The patient acknowledged the explanation, agreed to proceed with the plan and consent was signed. Patient is being admitted for inpatient treatment for surgery, pain control, PT, OT, prophylactic antibiotics, VTE prophylaxis, progressive ambulation and ADL's and discharge planning. The patient is planning to be discharged home with home health services     Patient's anticipated LOS is less than 2 midnights, meeting these requirements: - Younger than 71 - Lives within 1 hour of care - Has a competent adult at home to recover with post-op recover - NO history of  - Chronic pain requiring opiods  - Diabetes  - Coronary Artery Disease  - Heart failure  - Heart attack  - Stroke  - DVT/VTE  - Cardiac arrhythmia  - Respiratory Failure/COPD  - Renal failure  - Anemia  - Advanced Liver  disease

## 2019-12-16 MED ORDER — BUPIVACAINE LIPOSOME 1.3 % IJ SUSP
20.0000 mL | Freq: Once | INTRAMUSCULAR | Status: DC
  Filled 2019-12-16: qty 20

## 2019-12-16 MED ORDER — TRANEXAMIC ACID 1000 MG/10ML IV SOLN
2000.0000 mg | INTRAVENOUS | Status: DC
  Filled 2019-12-16: qty 20

## 2019-12-17 ENCOUNTER — Ambulatory Visit (HOSPITAL_COMMUNITY): Payer: PPO | Admitting: Registered Nurse

## 2019-12-17 ENCOUNTER — Encounter (HOSPITAL_COMMUNITY): Payer: Self-pay | Admitting: Orthopedic Surgery

## 2019-12-17 ENCOUNTER — Other Ambulatory Visit: Payer: Self-pay

## 2019-12-17 ENCOUNTER — Encounter (HOSPITAL_COMMUNITY)
Admission: RE | Disposition: A | Discharge status: Home Health | Payer: Self-pay | Source: Home / Self Care | Attending: Orthopedic Surgery

## 2019-12-17 ENCOUNTER — Inpatient Hospital Stay (HOSPITAL_COMMUNITY)
Admission: RE | Admit: 2019-12-17 | Discharge: 2019-12-19 | DRG: 470 | Disposition: A | Discharge status: Home Health | Payer: PPO | Attending: Orthopedic Surgery | Admitting: Orthopedic Surgery

## 2019-12-17 DIAGNOSIS — Z803 Family history of malignant neoplasm of breast: Secondary | ICD-10-CM

## 2019-12-17 DIAGNOSIS — Z888 Allergy status to other drugs, medicaments and biological substances status: Secondary | ICD-10-CM | POA: Diagnosis not present

## 2019-12-17 DIAGNOSIS — I10 Essential (primary) hypertension: Secondary | ICD-10-CM | POA: Diagnosis present

## 2019-12-17 DIAGNOSIS — E7800 Pure hypercholesterolemia, unspecified: Secondary | ICD-10-CM | POA: Diagnosis present

## 2019-12-17 DIAGNOSIS — Z8049 Family history of malignant neoplasm of other genital organs: Secondary | ICD-10-CM

## 2019-12-17 DIAGNOSIS — Z8249 Family history of ischemic heart disease and other diseases of the circulatory system: Secondary | ICD-10-CM

## 2019-12-17 DIAGNOSIS — Z881 Allergy status to other antibiotic agents status: Secondary | ICD-10-CM

## 2019-12-17 DIAGNOSIS — F419 Anxiety disorder, unspecified: Secondary | ICD-10-CM | POA: Diagnosis not present

## 2019-12-17 DIAGNOSIS — Z87891 Personal history of nicotine dependence: Secondary | ICD-10-CM | POA: Diagnosis not present

## 2019-12-17 DIAGNOSIS — G8918 Other acute postprocedural pain: Secondary | ICD-10-CM | POA: Diagnosis not present

## 2019-12-17 DIAGNOSIS — F329 Major depressive disorder, single episode, unspecified: Secondary | ICD-10-CM | POA: Diagnosis present

## 2019-12-17 DIAGNOSIS — Z833 Family history of diabetes mellitus: Secondary | ICD-10-CM | POA: Diagnosis not present

## 2019-12-17 DIAGNOSIS — K219 Gastro-esophageal reflux disease without esophagitis: Secondary | ICD-10-CM | POA: Diagnosis not present

## 2019-12-17 DIAGNOSIS — E669 Obesity, unspecified: Secondary | ICD-10-CM | POA: Diagnosis not present

## 2019-12-17 DIAGNOSIS — Z96651 Presence of right artificial knee joint: Secondary | ICD-10-CM

## 2019-12-17 DIAGNOSIS — M1711 Unilateral primary osteoarthritis, right knee: Secondary | ICD-10-CM | POA: Diagnosis not present

## 2019-12-17 DIAGNOSIS — E890 Postprocedural hypothyroidism: Secondary | ICD-10-CM | POA: Diagnosis not present

## 2019-12-17 DIAGNOSIS — Z6838 Body mass index (BMI) 38.0-38.9, adult: Secondary | ICD-10-CM | POA: Diagnosis not present

## 2019-12-17 DIAGNOSIS — D62 Acute posthemorrhagic anemia: Secondary | ICD-10-CM | POA: Diagnosis not present

## 2019-12-17 DIAGNOSIS — Z79899 Other long term (current) drug therapy: Secondary | ICD-10-CM

## 2019-12-17 DIAGNOSIS — Z79890 Hormone replacement therapy (postmenopausal): Secondary | ICD-10-CM | POA: Diagnosis not present

## 2019-12-17 DIAGNOSIS — M21161 Varus deformity, not elsewhere classified, right knee: Secondary | ICD-10-CM | POA: Diagnosis not present

## 2019-12-17 DIAGNOSIS — E785 Hyperlipidemia, unspecified: Secondary | ICD-10-CM | POA: Diagnosis not present

## 2019-12-17 DIAGNOSIS — F909 Attention-deficit hyperactivity disorder, unspecified type: Secondary | ICD-10-CM | POA: Diagnosis present

## 2019-12-17 HISTORY — PX: TOTAL KNEE ARTHROPLASTY: SHX125

## 2019-12-17 LAB — TYPE AND SCREEN
ABO/RH(D): A POS
Antibody Screen: NEGATIVE

## 2019-12-17 SURGERY — ARTHROPLASTY, KNEE, TOTAL
Anesthesia: Spinal | Site: Knee | Laterality: Right

## 2019-12-17 MED ORDER — LIDOCAINE 2% (20 MG/ML) 5 ML SYRINGE
INTRAMUSCULAR | Status: DC | PRN
  Administered 2019-12-17: 40 mg via INTRAVENOUS

## 2019-12-17 MED ORDER — ONDANSETRON HCL 4 MG/2ML IJ SOLN: 4.0000 mg | Freq: Once | INTRAMUSCULAR | Status: DC | PRN

## 2019-12-17 MED ORDER — DEXAMETHASONE SODIUM PHOSPHATE 10 MG/ML IJ SOLN
INTRAMUSCULAR | Status: AC
  Filled 2019-12-17: qty 1

## 2019-12-17 MED ORDER — DULOXETINE HCL 60 MG PO CPEP
120.0000 mg | ORAL_CAPSULE | Freq: Every day | ORAL | Status: DC
  Administered 2019-12-18 – 2019-12-19 (×2): 120 mg via ORAL
  Filled 2019-12-17 (×2): qty 2

## 2019-12-17 MED ORDER — MENTHOL 3 MG MT LOZG: 1.0000 | LOZENGE | OROMUCOSAL | Status: DC | PRN

## 2019-12-17 MED ORDER — BUPIVACAINE LIPOSOME 1.3 % IJ SUSP
INTRAMUSCULAR | Status: DC | PRN
  Administered 2019-12-17: 20 mL

## 2019-12-17 MED ORDER — IRBESARTAN 150 MG PO TABS
150.0000 mg | ORAL_TABLET | Freq: Every day | ORAL | Status: DC
  Administered 2019-12-19: 11:00:00 150 mg via ORAL
  Filled 2019-12-17: qty 1

## 2019-12-17 MED ORDER — METOCLOPRAMIDE HCL 5 MG/ML IJ SOLN: 5.0000 mg | Freq: Three times a day (TID) | INTRAMUSCULAR | Status: DC | PRN

## 2019-12-17 MED ORDER — ZOLPIDEM TARTRATE 5 MG PO TABS: 5.0000 mg | ORAL_TABLET | Freq: Every evening | ORAL | Status: DC | PRN

## 2019-12-17 MED ORDER — BISACODYL 5 MG PO TBEC: 5.0000 mg | DELAYED_RELEASE_TABLET | Freq: Every day | ORAL | Status: DC | PRN

## 2019-12-17 MED ORDER — ASPIRIN EC 81 MG PO TBEC
81.0000 mg | DELAYED_RELEASE_TABLET | Freq: Two times a day (BID) | ORAL | 0 refills | Status: DC
Start: 2019-12-17 — End: 2020-03-04

## 2019-12-17 MED ORDER — LIDOCAINE 2% (20 MG/ML) 5 ML SYRINGE
INTRAMUSCULAR | Status: AC
  Filled 2019-12-17: qty 5

## 2019-12-17 MED ORDER — PHENYLEPHRINE 40 MCG/ML (10ML) SYRINGE FOR IV PUSH (FOR BLOOD PRESSURE SUPPORT)
PREFILLED_SYRINGE | INTRAVENOUS | Status: AC
  Filled 2019-12-17: qty 10

## 2019-12-17 MED ORDER — PANTOPRAZOLE SODIUM 40 MG PO TBEC
40.0000 mg | DELAYED_RELEASE_TABLET | Freq: Every day | ORAL | Status: DC
  Administered 2019-12-17 – 2019-12-19 (×3): 40 mg via ORAL
  Filled 2019-12-17 (×3): qty 1

## 2019-12-17 MED ORDER — LISDEXAMFETAMINE DIMESYLATE 50 MG PO CAPS: 50.0000 mg | ORAL_CAPSULE | Freq: Every day | ORAL | Status: DC

## 2019-12-17 MED ORDER — PHENYLEPHRINE HCL (PRESSORS) 10 MG/ML IV SOLN
INTRAVENOUS | Status: AC
  Filled 2019-12-17: qty 1

## 2019-12-17 MED ORDER — METHOCARBAMOL 500 MG PO TABS
500.0000 mg | ORAL_TABLET | Freq: Four times a day (QID) | ORAL | Status: DC | PRN
  Administered 2019-12-17 – 2019-12-19 (×4): 500 mg via ORAL
  Filled 2019-12-17 (×5): qty 1

## 2019-12-17 MED ORDER — KCL IN DEXTROSE-NACL 20-5-0.45 MEQ/L-%-% IV SOLN
INTRAVENOUS | Status: DC
  Filled 2019-12-17 (×3): qty 1000

## 2019-12-17 MED ORDER — POVIDONE-IODINE 10 % EX SWAB
2.0000 "application " | Freq: Once | CUTANEOUS | Status: AC
  Administered 2019-12-17: 2 via TOPICAL

## 2019-12-17 MED ORDER — ONDANSETRON HCL 4 MG PO TABS: 4.0000 mg | ORAL_TABLET | Freq: Four times a day (QID) | ORAL | Status: DC | PRN

## 2019-12-17 MED ORDER — SODIUM CHLORIDE (PF) 0.9 % IJ SOLN
INTRAMUSCULAR | Status: DC | PRN
  Administered 2019-12-17: 70 mL via INTRAVENOUS

## 2019-12-17 MED ORDER — PROPOFOL 1000 MG/100ML IV EMUL
INTRAVENOUS | Status: AC
  Filled 2019-12-17: qty 100

## 2019-12-17 MED ORDER — SODIUM CHLORIDE 0.9 % IR SOLN
Status: DC | PRN
  Administered 2019-12-17: 1000 mL

## 2019-12-17 MED ORDER — OXYCODONE HCL 5 MG/5ML PO SOLN: 5.0000 mg | Freq: Once | ORAL | Status: DC | PRN

## 2019-12-17 MED ORDER — ONDANSETRON HCL 4 MG/2ML IJ SOLN
INTRAMUSCULAR | Status: DC | PRN
  Administered 2019-12-17: 4 mg via INTRAVENOUS

## 2019-12-17 MED ORDER — SIMVASTATIN 40 MG PO TABS
40.0000 mg | ORAL_TABLET | Freq: Every day | ORAL | Status: DC
  Administered 2019-12-17 – 2019-12-19 (×3): 40 mg via ORAL
  Filled 2019-12-17 (×3): qty 1

## 2019-12-17 MED ORDER — FLEET ENEMA 7-19 GM/118ML RE ENEM: 1.0000 | ENEMA | Freq: Once | RECTAL | Status: DC | PRN

## 2019-12-17 MED ORDER — TRANEXAMIC ACID-NACL 1000-0.7 MG/100ML-% IV SOLN
1000.0000 mg | INTRAVENOUS | Status: AC
  Administered 2019-12-17: 1000 mg via INTRAVENOUS
  Filled 2019-12-17: qty 100

## 2019-12-17 MED ORDER — VANCOMYCIN HCL IN DEXTROSE 1-5 GM/200ML-% IV SOLN
1000.0000 mg | INTRAVENOUS | Status: AC
  Administered 2019-12-17: 1000 mg via INTRAVENOUS
  Filled 2019-12-17: qty 200

## 2019-12-17 MED ORDER — OXYCODONE HCL 5 MG PO TABS: 5.0000 mg | ORAL_TABLET | Freq: Once | ORAL | Status: DC | PRN

## 2019-12-17 MED ORDER — ROPIVACAINE HCL 5 MG/ML IJ SOLN
INTRAMUSCULAR | Status: DC | PRN
  Administered 2019-12-17: 30 mL via PERINEURAL

## 2019-12-17 MED ORDER — FENTANYL CITRATE (PF) 100 MCG/2ML IJ SOLN: 25.0000 ug | INTRAMUSCULAR | Status: DC | PRN

## 2019-12-17 MED ORDER — DIPHENHYDRAMINE HCL 12.5 MG/5ML PO ELIX: 12.5000 mg | ORAL_SOLUTION | ORAL | Status: DC | PRN

## 2019-12-17 MED ORDER — OXYCODONE HCL 5 MG PO TABS
5.0000 mg | ORAL_TABLET | ORAL | Status: DC | PRN
  Administered 2019-12-17 (×2): 5 mg via ORAL
  Administered 2019-12-18 – 2019-12-19 (×10): 10 mg via ORAL
  Filled 2019-12-17 (×5): qty 2
  Filled 2019-12-17: qty 1
  Filled 2019-12-17 (×3): qty 2
  Filled 2019-12-17: qty 1
  Filled 2019-12-17 (×2): qty 2

## 2019-12-17 MED ORDER — BUPIVACAINE-EPINEPHRINE (PF) 0.5% -1:200000 IJ SOLN
INTRAMUSCULAR | Status: AC
  Filled 2019-12-17: qty 30

## 2019-12-17 MED ORDER — METHOCARBAMOL 500 MG IVPB - SIMPLE MED
500.0000 mg | Freq: Four times a day (QID) | INTRAVENOUS | Status: DC | PRN
  Filled 2019-12-17: qty 50

## 2019-12-17 MED ORDER — PHENOL 1.4 % MT LIQD: 1.0000 | OROMUCOSAL | Status: DC | PRN

## 2019-12-17 MED ORDER — LACTATED RINGERS IV SOLN: INTRAVENOUS | Status: DC

## 2019-12-17 MED ORDER — PHENYLEPHRINE HCL-NACL 10-0.9 MG/250ML-% IV SOLN
INTRAVENOUS | Status: DC | PRN
Start: 2019-12-17 — End: 2019-12-17
  Administered 2019-12-17: 40 ug/min via INTRAVENOUS

## 2019-12-17 MED ORDER — PHENYLEPHRINE 40 MCG/ML (10ML) SYRINGE FOR IV PUSH (FOR BLOOD PRESSURE SUPPORT)
PREFILLED_SYRINGE | INTRAVENOUS | Status: DC | PRN
  Administered 2019-12-17: 120 ug via INTRAVENOUS
  Administered 2019-12-17 (×2): 80 ug via INTRAVENOUS

## 2019-12-17 MED ORDER — ASPIRIN 81 MG PO CHEW
81.0000 mg | CHEWABLE_TABLET | Freq: Two times a day (BID) | ORAL | Status: DC
  Administered 2019-12-17 – 2019-12-19 (×4): 81 mg via ORAL
  Filled 2019-12-17 (×4): qty 1

## 2019-12-17 MED ORDER — ONDANSETRON HCL 4 MG/2ML IJ SOLN
4.0000 mg | Freq: Four times a day (QID) | INTRAMUSCULAR | Status: DC | PRN
  Administered 2019-12-18: 4 mg via INTRAVENOUS
  Filled 2019-12-17: qty 2

## 2019-12-17 MED ORDER — HYDROMORPHONE HCL 1 MG/ML IJ SOLN
0.5000 mg | INTRAMUSCULAR | Status: DC | PRN
  Administered 2019-12-18: 0.5 mg via INTRAVENOUS
  Administered 2019-12-18: 1 mg via INTRAVENOUS
  Administered 2019-12-18: 0.5 mg via INTRAVENOUS
  Filled 2019-12-17 (×3): qty 1

## 2019-12-17 MED ORDER — ONDANSETRON HCL 4 MG/2ML IJ SOLN
INTRAMUSCULAR | Status: AC
  Filled 2019-12-17: qty 2

## 2019-12-17 MED ORDER — PROPOFOL 10 MG/ML IV BOLUS
INTRAVENOUS | Status: DC | PRN
  Administered 2019-12-17 (×2): 10 mg via INTRAVENOUS
  Administered 2019-12-17: 30 mg via INTRAVENOUS

## 2019-12-17 MED ORDER — METOCLOPRAMIDE HCL 5 MG PO TABS: 5.0000 mg | ORAL_TABLET | Freq: Three times a day (TID) | ORAL | Status: DC | PRN

## 2019-12-17 MED ORDER — SODIUM CHLORIDE (PF) 0.9 % IJ SOLN
INTRAMUSCULAR | Status: AC
  Filled 2019-12-17: qty 50

## 2019-12-17 MED ORDER — BUPIVACAINE IN DEXTROSE 0.75-8.25 % IT SOLN
INTRATHECAL | Status: DC | PRN
  Administered 2019-12-17: 2 mL via INTRATHECAL

## 2019-12-17 MED ORDER — TRANEXAMIC ACID 1000 MG/10ML IV SOLN
INTRAVENOUS | Status: DC | PRN
  Administered 2019-12-17: 2000 mg via TOPICAL

## 2019-12-17 MED ORDER — PROPOFOL 500 MG/50ML IV EMUL
INTRAVENOUS | Status: DC | PRN
  Administered 2019-12-17: 75 ug/kg/min via INTRAVENOUS

## 2019-12-17 MED ORDER — ALUM & MAG HYDROXIDE-SIMETH 200-200-20 MG/5ML PO SUSP: 30.0000 mL | ORAL | Status: DC | PRN

## 2019-12-17 MED ORDER — MIDAZOLAM HCL 2 MG/2ML IJ SOLN
1.0000 mg | Freq: Once | INTRAMUSCULAR | Status: AC
  Administered 2019-12-17: 2 mg via INTRAVENOUS
  Filled 2019-12-17: qty 2

## 2019-12-17 MED ORDER — SODIUM CHLORIDE (PF) 0.9 % IJ SOLN
INTRAMUSCULAR | Status: AC
  Filled 2019-12-17: qty 20

## 2019-12-17 MED ORDER — CELECOXIB 200 MG PO CAPS
200.0000 mg | ORAL_CAPSULE | Freq: Two times a day (BID) | ORAL | Status: DC
  Administered 2019-12-17 – 2019-12-19 (×4): 200 mg via ORAL
  Filled 2019-12-17 (×4): qty 1

## 2019-12-17 MED ORDER — BUPROPION HCL ER (XL) 300 MG PO TB24
300.0000 mg | ORAL_TABLET | Freq: Every day | ORAL | Status: DC
  Administered 2019-12-18 – 2019-12-19 (×2): 300 mg via ORAL
  Filled 2019-12-17 (×2): qty 1

## 2019-12-17 MED ORDER — ACETAMINOPHEN 325 MG PO TABS
325.0000 mg | ORAL_TABLET | Freq: Four times a day (QID) | ORAL | Status: DC | PRN
  Administered 2019-12-17 – 2019-12-19 (×2): 650 mg via ORAL
  Filled 2019-12-17 (×2): qty 2

## 2019-12-17 MED ORDER — LEVOTHYROXINE SODIUM 125 MCG PO TABS
125.0000 ug | ORAL_TABLET | Freq: Every day | ORAL | Status: DC
  Administered 2019-12-18 – 2019-12-19 (×2): 125 ug via ORAL
  Filled 2019-12-17 (×2): qty 1

## 2019-12-17 MED ORDER — BUPIVACAINE-EPINEPHRINE 0.5% -1:200000 IJ SOLN
INTRAMUSCULAR | Status: DC | PRN
  Administered 2019-12-17: 30 mL

## 2019-12-17 MED ORDER — TRANEXAMIC ACID-NACL 1000-0.7 MG/100ML-% IV SOLN
1000.0000 mg | Freq: Once | INTRAVENOUS | Status: AC
  Administered 2019-12-17: 1000 mg via INTRAVENOUS
  Filled 2019-12-17: qty 100

## 2019-12-17 MED ORDER — TORSEMIDE 10 MG PO TABS
10.0000 mg | ORAL_TABLET | Freq: Every day | ORAL | Status: DC | PRN
  Filled 2019-12-17: qty 1

## 2019-12-17 MED ORDER — FENTANYL CITRATE (PF) 100 MCG/2ML IJ SOLN
50.0000 ug | Freq: Once | INTRAMUSCULAR | Status: AC
  Administered 2019-12-17: 50 ug via INTRAVENOUS
  Filled 2019-12-17: qty 2

## 2019-12-17 MED ORDER — ALPRAZOLAM 0.5 MG PO TABS
0.5000 mg | ORAL_TABLET | Freq: Every day | ORAL | Status: DC | PRN
  Administered 2019-12-18: 21:00:00 0.5 mg via ORAL
  Filled 2019-12-17: qty 1

## 2019-12-17 MED ORDER — OXYCODONE-ACETAMINOPHEN 5-325 MG PO TABS: 1.0000 | ORAL_TABLET | ORAL | 0 refills | Status: DC | PRN

## 2019-12-17 MED ORDER — DEXAMETHASONE SODIUM PHOSPHATE 10 MG/ML IJ SOLN
INTRAMUSCULAR | Status: DC | PRN
  Administered 2019-12-17: 10 mg via INTRAVENOUS

## 2019-12-17 MED ORDER — WATER FOR IRRIGATION, STERILE IR SOLN
Status: DC | PRN
  Administered 2019-12-17: 2000 mL

## 2019-12-17 MED ORDER — POLYETHYLENE GLYCOL 3350 17 G PO PACK: 17.0000 g | PACK | Freq: Every day | ORAL | Status: DC | PRN

## 2019-12-17 MED ORDER — TIZANIDINE HCL 2 MG PO TABS
2.0000 mg | ORAL_TABLET | Freq: Four times a day (QID) | ORAL | 0 refills | Status: DC | PRN
Start: 2019-12-17 — End: 2020-03-03

## 2019-12-17 MED ORDER — DOCUSATE SODIUM 100 MG PO CAPS
100.0000 mg | ORAL_CAPSULE | Freq: Two times a day (BID) | ORAL | Status: DC
  Administered 2019-12-17 – 2019-12-19 (×4): 100 mg via ORAL
  Filled 2019-12-17 (×4): qty 1

## 2019-12-17 SURGICAL SUPPLY — 55 items
ATTUNE PS FEM RT SZ 5 CEM KNEE (Femur) ×1 IMPLANT
ATTUNE PSRP INSR SZ5 5 KNEE (Insert) ×1 IMPLANT
BAG DECANTER FOR FLEXI CONT (MISCELLANEOUS) ×2 IMPLANT
BAG SPEC THK2 15X12 ZIP CLS (MISCELLANEOUS) ×1
BAG ZIPLOCK 12X15 (MISCELLANEOUS) ×2 IMPLANT
BASE TIBIAL ROT PLAT SZ 5 KNEE (Knees) IMPLANT
BLADE SAG 18X100X1.27 (BLADE) ×2 IMPLANT
BLADE SAW SGTL 11.0X1.19X90.0M (BLADE) ×2 IMPLANT
BLADE SURG SZ10 CARB STEEL (BLADE) ×4 IMPLANT
BNDG CMPR MED 10X6 ELC LF (GAUZE/BANDAGES/DRESSINGS) ×1
BNDG ELASTIC 6X10 VLCR STRL LF (GAUZE/BANDAGES/DRESSINGS) ×2 IMPLANT
BOWL SMART MIX CTS (DISPOSABLE) ×2 IMPLANT
BSPLAT TIB 5 CMNT ROT PLAT STR (Knees) ×1 IMPLANT
CEMENT HV SMART SET (Cement) ×4 IMPLANT
COVER SURGICAL LIGHT HANDLE (MISCELLANEOUS) ×2 IMPLANT
COVER WAND RF STERILE (DRAPES) IMPLANT
CUFF TOURN SGL QUICK 34 (TOURNIQUET CUFF) ×2
CUFF TRNQT CYL 34X4.125X (TOURNIQUET CUFF) ×1 IMPLANT
DECANTER SPIKE VIAL GLASS SM (MISCELLANEOUS) ×6 IMPLANT
DRAPE U-SHAPE 47X51 STRL (DRAPES) ×2 IMPLANT
DRESSING AQUACEL AG SP 3.5X10 (GAUZE/BANDAGES/DRESSINGS) IMPLANT
DRSG AQUACEL AG ADV 3.5X10 (GAUZE/BANDAGES/DRESSINGS) ×2 IMPLANT
DRSG AQUACEL AG SP 3.5X10 (GAUZE/BANDAGES/DRESSINGS) ×2
DURAPREP 26ML APPLICATOR (WOUND CARE) ×2 IMPLANT
ELECT REM PT RETURN 15FT ADLT (MISCELLANEOUS) ×2 IMPLANT
GLOVE BIO SURGEON STRL SZ7.5 (GLOVE) ×2 IMPLANT
GLOVE BIO SURGEON STRL SZ8.5 (GLOVE) ×2 IMPLANT
GLOVE BIOGEL PI IND STRL 8 (GLOVE) ×1 IMPLANT
GLOVE BIOGEL PI IND STRL 9 (GLOVE) ×1 IMPLANT
GLOVE BIOGEL PI INDICATOR 8 (GLOVE) ×1
GLOVE BIOGEL PI INDICATOR 9 (GLOVE) ×1
GOWN STRL REUS W/TWL XL LVL3 (GOWN DISPOSABLE) ×4 IMPLANT
HANDPIECE INTERPULSE COAX TIP (DISPOSABLE) ×2
HOOD PEEL AWAY FLYTE STAYCOOL (MISCELLANEOUS) ×6 IMPLANT
KIT TURNOVER KIT A (KITS) IMPLANT
NDL HYPO 21X1.5 SAFETY (NEEDLE) ×2 IMPLANT
NEEDLE HYPO 21X1.5 SAFETY (NEEDLE) ×4 IMPLANT
NS IRRIG 1000ML POUR BTL (IV SOLUTION) ×2 IMPLANT
PACK ICE MAXI GEL EZY WRAP (MISCELLANEOUS) ×2 IMPLANT
PACK TOTAL KNEE CUSTOM (KITS) ×2 IMPLANT
PATELLA MEDIAL ATTUN 35MM KNEE (Knees) ×1 IMPLANT
PENCIL SMOKE EVACUATOR (MISCELLANEOUS) IMPLANT
PIN DRILL FIX HALF THREAD (BIT) ×1 IMPLANT
PIN STEINMAN FIXATION KNEE (PIN) ×1 IMPLANT
PROTECTOR NERVE ULNAR (MISCELLANEOUS) ×2 IMPLANT
SET HNDPC FAN SPRY TIP SCT (DISPOSABLE) ×1 IMPLANT
SUT VIC AB 1 CTX 36 (SUTURE) ×2
SUT VIC AB 1 CTX36XBRD ANBCTR (SUTURE) ×1 IMPLANT
SUT VIC AB 3-0 CT1 27 (SUTURE) ×6
SUT VIC AB 3-0 CT1 TAPERPNT 27 (SUTURE) ×3 IMPLANT
SYR CONTROL 10ML LL (SYRINGE) ×4 IMPLANT
TIBIAL BASE ROT PLAT SZ 5 KNEE (Knees) ×2 IMPLANT
TRAY FOLEY MTR SLVR 16FR STAT (SET/KITS/TRAYS/PACK) ×2 IMPLANT
WATER STERILE IRR 1000ML POUR (IV SOLUTION) ×4 IMPLANT
YANKAUER SUCT BULB TIP 10FT TU (MISCELLANEOUS) ×2 IMPLANT

## 2019-12-17 NOTE — Op Note (Signed)
PATIENT ID:      [name]  MRN:     [id number] DOB/AGE:    24-Sep-1950 / 69 y.o. y.o.       OPERATIVE REPORT   DATE OF PROCEDURE:  12/17/2019      PREOPERATIVE DIAGNOSIS:   RIGHT KNEE OSTEOARTHRITIS      Estimated body mass index is 38.98 kg/m as calculated from the following:   Height as of 12/13/19: 5' 3.5" (1.613 m).   Weight as of this encounter: 101.4 kg.                                                       POSTOPERATIVE DIAGNOSIS:   Same                                                                  PROCEDURE:  Procedure(s): RIGHT TOTAL KNEE ARTHROPLASTY Using DepuyAttune RP implants #5R Femur, #5Tibia, 5 mm Attune RP bearing, 35 Patella    SURGEON: Kerin Salen  ASSISTANT:   Kerry Hough. [employer]   (Present and scrubbed throughout the case, critical for assistance with exposure, retraction, instrumentation, and closure.)        ANESTHESIA: Spinal, 20cc Exparel, 50cc 0.25% Marcaine EBL: 300 cc FLUID REPLACEMENT: 1600 cc crystaloid TOURNIQUET: DRAINS: None TRANEXAMIC ACID: 1gm IV, 2gm topical COMPLICATIONS:  None         INDICATIONS FOR PROCEDURE: The patient has  RIGHT KNEE OSTEOARTHRITIS, Val deformities, XR shows bone on bone arthritis, lateral subluxation of tibia. Patient has failed all conservative measures including anti-inflammatory medicines, narcotics, attempts at exercise and weight loss, cortisone injections and viscosupplementation.  Risks and benefits of surgery have been discussed, questions answered.   DESCRIPTION OF PROCEDURE: The patient identified by armband, received  IV antibiotics, in the holding area at [facility]. Patient taken to the operating room, appropriate anesthetic monitors were attached, and Spinal anesthesia was  induced. IV Tranexamic acid was given.Tourniquet applied high to the operative thigh. Lateral post and foot positioner applied to the table, the lower extremity was then prepped and draped in usual sterile fashion from the toes  to the tourniquet. Time-out procedure was performed. Kerry Hough. [facility] PAC, was present and scrubbed throughout the case, critical for assistance with, positioning, exposure, retraction, instrumentation, and closure.The skin and subcutaneous tissue along the incision was injected with 20 cc of a mixture of Exparel and Marcaine solution, using a 20-gauge by 1-1/2 inch needle. We began the operation, with the knee flexed 130 degrees, by making the anterior midline incision starting at handbreadth above the patella going over the patella 1 cm medial to and 4 cm distal to the tibial tubercle. Small bleeders in the skin and the subcutaneous tissue identified and cauterized. Transverse retinaculum was incised and reflected medially and a medial parapatellar arthrotomy was accomplished. the patella was everted and theprepatellar fat pad resected. The superficial medial collateral ligament was then elevated from anterior to posterior along the proximal flare of the tibia and anterior half of the menisci resected. The knee was hyperflexed exposing bone on bone arthritis. Peripheral and  notch osteophytes as well as the cruciate ligaments were then resected. We continued to work our way around posteriorly along the proximal tibia, and externally rotated the tibia subluxing it out from underneath the femur. A McHale PCL retractor was placed through the notch and a lateral Hohmann retractor placed, and we then entered the proximal tibia in line with the Depuy starter drill in line with the axis of the tibia followed by an intramedullary guide rod and 0-degree posterior slope cutting guide. The tibial cutting guide, 4 degree posterior sloped, was pinned into place allowing resection of 7 mm of bone medially and 0 mm of bone laterally. Satisfied with the tibial resection, we then entered the distal femur 2 mm anterior to the PCL origin with the intramedullary guide rod and applied the distal femoral cutting guide set at 9 mm, with  7 degrees of valgus. This was pinned along the epicondylar axis. At this point, the distal femoral cut was accomplished without difficulty. We then sized for a #5R femoral component and pinned the guide in 0 degrees of external rotation. The chamfer cutting guide was pinned into place. The anterior, posterior, and chamfer cuts were accomplished without difficulty followed by the Attune RP box cutting guide and the box cut. We also removed posterior osteophytes from the posterior femoral condyles. The posterior capsule was injected with Exparel solution. The knee was brought into full extension. We checked our extension gap and fit a 5 mm bearing. Distracting in extension with a lamina spreader,  bleeders in the posterior capsule, Posterior medial and posterior lateral gutter were cauterized.  The transexamic acid-soaked sponge was then placed in the gap of the knee in extension. The knee was flexed 30. The posterior patella cut was accomplished with the 9.5 mm Attune cutting guide, sized for a 89mm dome, and the fixation pegs drilled.The knee was then once again hyperflexed exposing the proximal tibia. We sized for a # 5 tibial base plate, applied the smokestack and the conical reamer followed by the the Delta fin keel punch. We then hammered into place the Attune RP trial femoral component, drilled the lugs, inserted a  5 mm trial bearing, trial patellar button, and took the knee through range of motion from 0-130 degrees. Medial and lateral ligamentous stability was checked. No thumb pressure was required for patellar Tracking. The tourniquet was not used. All trial components were removed, mating surfaces irrigated with pulse lavage, and dried with suction and sponges. 10 cc of the Exparel solution was applied to the cancellus bone of the patella distal femur and proximal tibia.  After waiting 30 seconds, the bony surfaces were again, dried with sponges. A double batch of DePuy HV cement was mixed and applied to  all bony metallic mating surfaces except for the posterior condyles of the femur itself. In order, we hammered into place the tibial tray and removed excess cement, the femoral component and removed excess cement. The final Attune RP bearing was inserted, and the knee brought to full extension with compression. The patellar button was clamped into place, and excess cement removed. The knee was held at 30 flexion with compression, while the cement cured. The wound was irrigated out with normal saline solution pulse lavage. The rest of the Exparel was injected into the parapatellar arthrotomy, subcutaneous tissues, and periosteal tissues. The parapatellar arthrotomy was closed with running #1 Vicryl suture. The subcutaneous tissue with 3-0 undyed Vicryl suture, and the skin with running 3-0 SQ vicryl. An Aquacil and Ace  wrap were applied. The patient was taken to recovery room without difficulty.   Kerin Salen 12/17/2019, 9:05 AM

## 2019-12-17 NOTE — Progress Notes (Signed)
AssistedDr. Carolyn Witman with right, ultrasound guided, adductor canal block. Side rails up, monitors on throughout procedure. See vital signs in flow sheet. Tolerated Procedure well.  

## 2019-12-17 NOTE — Anesthesia Postprocedure Evaluation (Signed)
Anesthesia Post Note  Patient: [name] [name]  Procedure(s) Performed: RIGHT TOTAL KNEE ARTHROPLASTY (Right Knee)     Patient location during evaluation: PACU Anesthesia Type: Spinal Level of consciousness: oriented and awake and alert Pain management: pain level controlled Vital Signs Assessment: post-procedure vital signs reviewed and stable Respiratory status: spontaneous breathing, respiratory function stable and nonlabored ventilation Cardiovascular status: blood pressure returned to baseline and stable Postop Assessment: no headache, no backache, no apparent nausea or vomiting and spinal receding Anesthetic complications: no    Last Vitals:  Vitals:   12/17/19 1315 12/17/19 1334  BP:  (!) 119/59  Pulse:  72  Resp:  16  Temp: (!) 36.3 C 36.6 C  SpO2:  100%    Last Pain:  Vitals:   12/17/19 1334  TempSrc: Oral  PainSc: 0-No pain    LLE Motor Response: (P) Purposeful movement (12/17/19 1345) LLE Sensation: (P) Tingling (12/17/19 1345) RLE Motor Response: (P) Purposeful movement (12/17/19 1345) RLE Sensation: (P) Numbness (12/17/19 1345) L Sensory Level: (P) L3-Anterior knee, lower leg (12/17/19 1345) R Sensory Level: (P) L3-Anterior knee, lower leg (12/17/19 1345)  Lidia Collum

## 2019-12-17 NOTE — Anesthesia Procedure Notes (Addendum)
Anesthesia Regional Block: Adductor canal block   Pre-Anesthetic Checklist: ,, timeout performed, Correct Patient, Correct Site, Correct Laterality, Correct Procedure, Correct Position, site marked, Risks and benefits discussed,  Surgical consent,  Pre-op evaluation,  At surgeon's request and post-op pain management  Laterality: Right  Prep: chloraprep       Needles:  Injection technique: Single-shot  Needle Type: Echogenic Stimulator Needle     Needle Length: 10cm  Needle Gauge: 21     Additional Needles:   Procedures:,,,, ultrasound used (permanent image in chart),,,,  Narrative:  Start time: 12/17/2019 9:00 AM End time: 12/17/2019 9:04 AM Injection made incrementally with aspirations every 5 mL.  Performed by: Personally  Anesthesiologist: Lidia Collum, MD  Additional Notes: Monitors applied. Injection made in 5cc increments. No resistance to injection. Good needle visualization. Patient tolerated procedure well.

## 2019-12-17 NOTE — Anesthesia Preprocedure Evaluation (Addendum)
Anesthesia Evaluation  Patient identified by MRN, date of birth, ID band Patient awake    Reviewed: Allergy & Precautions, NPO status , Patient's Chart, lab work & pertinent test results  History of Anesthesia Complications Negative for: history of anesthetic complications  Airway Mallampati: II  TM Distance: >3 FB Neck ROM: Full    Dental  (+) Teeth Intact   Pulmonary neg pulmonary ROS, former smoker,    Pulmonary exam normal        Cardiovascular hypertension, Pt. on medications Normal cardiovascular exam     Neuro/Psych PSYCHIATRIC DISORDERS Anxiety Depression negative neurological ROS     GI/Hepatic Neg liver ROS, GERD  ,  Endo/Other  Hypothyroidism   Renal/GU negative Renal ROS  negative genitourinary   Musculoskeletal  (+) Arthritis , Osteoarthritis,    Abdominal   Peds  (+) ADHD Hematology negative hematology ROS (+)   Anesthesia Other Findings  INR 1.0, plts 431  Stress test 2019: Myocardial perfusion is normal.   The study is normal.   This is a low risk study.  LV cavity size is normal.  Nuclear stress EF:  53%.  The left ventricular ejection fraction is mildly decreased (45-54%).   There is no prior study for comparison.  Reproductive/Obstetrics                            Anesthesia Physical Anesthesia Plan  ASA: II  Anesthesia Plan: Spinal   Post-op Pain Management:  Regional for Post-op pain   Induction:   PONV Risk Score and Plan: 2 and Propofol infusion, Treatment may vary due to age or medical condition, Ondansetron and TIVA  Airway Management Planned: Nasal Cannula and Simple Face Mask  Additional Equipment: None  Intra-op Plan:   Post-operative Plan:   Informed Consent: I have reviewed the patients History and Physical, chart, labs and discussed the procedure including the risks, benefits and alternatives for the proposed anesthesia with the patient or  authorized representative who has indicated his/her understanding and acceptance.       Plan Discussed with:   Anesthesia Plan Comments:         Anesthesia Quick Evaluation

## 2019-12-17 NOTE — Anesthesia Procedure Notes (Signed)
Date/Time: 12/17/2019 10:03 AM Performed by: Talbot Grumbling, CRNA Oxygen Delivery Method: Simple face mask

## 2019-12-17 NOTE — Plan of Care (Signed)
Plan of care 

## 2019-12-17 NOTE — Plan of Care (Signed)
  Problem: Education: Goal: Knowledge of General Education information will improve Description Including pain rating scale, medication(s)/side effects and non-pharmacologic comfort measures Outcome: Progressing   Problem: Nutrition: Goal: Adequate nutrition will be maintained Outcome: Progressing   Problem: Pain Managment: Goal: General experience of comfort will improve Outcome: Progressing   

## 2019-12-17 NOTE — Anesthesia Procedure Notes (Signed)
Spinal  Patient location during procedure: OR Staffing Performed: anesthesiologist  Anesthesiologist: Lidia Collum, MD Preanesthetic Checklist Completed: patient identified, IV checked, risks and benefits discussed, surgical consent, monitors and equipment checked, pre-op evaluation and timeout performed Spinal Block Patient position: sitting Prep: DuraPrep and site prepped and draped Patient monitoring: continuous pulse ox, blood pressure and heart rate Approach: midline Location: L4-5 Injection technique: single-shot Needle Needle type: Pencan and Quincke  Needle gauge: 22 G Needle length: 9 cm Additional Notes Functioning IV was confirmed and monitors were applied. Sterile prep and drape, including hand hygiene and sterile gloves were used. The patient was positioned and the spine was prepped. The skin was anesthetized with lidocaine.  Free flow of clear CSF was obtained prior to injecting local anesthetic into the CSF. The needle was carefully withdrawn. The patient tolerated the procedure well.   1 attempt by Marla Roe, CRNA midline at L3-4. 2nd attempt by C. Leilanee Righetti, MD at L3-4 midline and left paramedian. 3rd attempt at L4-5 midline with 22ga quincke with clear flow of CSF.

## 2019-12-17 NOTE — Interval H&P Note (Signed)
History and Physical Interval Note:  12/17/2019 9:05 AM  [name] [name]  has presented today for surgery, with the diagnosis of RIGHT KNEE OSTEOARTHRITIS.  The various methods of treatment have been discussed with the patient and family. After consideration of risks, benefits and other options for treatment, the patient has consented to  Procedure(s): RIGHT TOTAL KNEE ARTHROPLASTY (Right) as a surgical intervention.  The patient's history has been reviewed, patient examined, no change in status, stable for surgery.  I have reviewed the patient's chart and labs.  Questions were answered to the patient's satisfaction.     Kerin Salen

## 2019-12-17 NOTE — Discharge Instructions (Signed)

## 2019-12-17 NOTE — Evaluation (Signed)
Physical Therapy Evaluation Patient Details Name: [name] [name] MRN: [id number] DOB: July 05, 1951 Today's Date: 12/17/2019   History of Present Illness  Patient is 69 y.o. female s/p Rt TKA on 12/17/19 with PMH significant for HTN, HLD, GERD, depression, anxiety, OA, ADHA, incontinence, triple arthrodesis of Rt foot.    Clinical Impression  [name] [name] is a 69 y.o. female POD 0 s/p Rt TKA. Patient reports limitations with mobility prior to surgery ans she was requiring use of crutches in home for mobility and wheelchair for long distances. Patient is now limited by functional impairments (see PT problem list below) and requires mod assist for transfers and gait with RW. Patient was able to ambulate ~30 feet with RW and mod assist. Patient instructed in exercise to facilitate circulation. Patient will benefit from continued skilled PT interventions to address impairments and progress towards PLOF. Acute PT will follow to progress mobility and stair training in preparation for safe discharge home.     Follow Up Recommendations Follow surgeon's recommendation for DC plan and follow-up therapies;Home health PT    Equipment Recommendations  Rolling walker with 5" wheels    Recommendations for Other Services       Precautions / Restrictions Precautions Precautions: Fall Restrictions Weight Bearing Restrictions: No Other Position/Activity Restrictions: WBAT      Mobility  Bed Mobility Overal bed mobility: Needs Assistance Bed Mobility: Supine to Sit     Supine to sit: Min assist;HOB elevated     General bed mobility comments: cues for using bed rail and assist to raise trunk upright and bring LE's off EOB  Transfers Overall transfer level: Needs assistance Equipment used: Rolling walker (2 wheeled) Transfers: Sit to/from Stand Sit to Stand: Mod assist;From elevated surface;+2 physical assistance;+2 safety/equipment         General transfer comment: cues for safe technique with  hand placement on RW. assist required to initiate and complete power up to steady with rising and steady as pt very anxious about standing.   Ambulation/Gait Ambulation/Gait assistance: Mod assist Gait Distance (Feet): 30 Feet Assistive device: Rolling walker (2 wheeled) Gait Pattern/deviations: Step-to pattern;Decreased stride length;Decreased step length - left;Decreased stance time - right;Decreased weight shift to right;Antalgic;Trunk flexed Gait velocity: decreased   General Gait Details: cues for safe step pattern and proximity to RW, assist required to facilitate Rt knee extension in stance and steady. cues to press into great toes bil in standing as pt has tendency to walk on heels. 2 episodes of LOB requiring assist to restabilize.  Stairs         Wheelchair Mobility    Modified Rankin (Stroke Patients Only)       Balance Overall balance assessment: Needs assistance Sitting-balance support: Feet supported Sitting balance-Leahy Scale: Good   Postural control: Posterior lean Standing balance support: During functional activity;Bilateral upper extremity supported Standing balance-Leahy Scale: Poor Standing balance comment: reliant on external support for stability          Pertinent Vitals/Pain Pain Assessment: 0-10 Pain Score: 4  Pain Location: Rt knee Pain Descriptors / Indicators: Aching;Discomfort;Sore Pain Intervention(s): Limited activity within patient's tolerance;Monitored during session;Repositioned;Ice applied    Home Living Family/patient expects to be discharged to:: Private residence Living Arrangements: Spouse/significant other Available Help at Discharge: Family Type of Home: House Home Access: Stairs to enter Entrance Stairs-Rails: Can reach both(2 have rails, doorway at last step) Entrance Stairs-Number of Steps: 2+1 Home Layout: One level Home Equipment: Environmental consultant - 4 wheels;Bedside commode;Grab bars - toilet;Shower seat -  built in;Crutches       Prior Function Level of Independence: Independent with assistive device(s);Needs assistance   Gait / Transfers Assistance Needed: pt has been using crutches in home and wheelchair out of home for mobility due to pain.  ADL's / Homemaking Assistance Needed: husband has been assisting some with bathing/getting in/out of shower        Hand Dominance   Dominant Hand: Right    Extremity/Trunk Assessment   Upper Extremity Assessment Upper Extremity Assessment: Overall WFL for tasks assessed    Lower Extremity Assessment Lower Extremity Assessment: RLE deficits/detail RLE Deficits / Details: good quad activaiton, no extensor lag with SLR RLE Sensation: WNL RLE Coordination: WNL    Cervical / Trunk Assessment Cervical / Trunk Assessment: Normal  Communication   Communication: No difficulties  Cognition Arousal/Alertness: Awake/alert Behavior During Therapy: WFL for tasks assessed/performed;Anxious Overall Cognitive Status: Within Functional Limits for tasks assessed         General Comments      Exercises Total Joint Exercises Ankle Circles/Pumps: AROM;Both;20 reps;Seated   Assessment/Plan    PT Assessment Patient needs continued PT services  PT Problem List Decreased strength;Decreased range of motion;Decreased activity tolerance;Decreased balance;Decreased mobility;Decreased knowledge of use of DME;Decreased safety awareness;Decreased knowledge of precautions;Pain       PT Treatment Interventions DME instruction;Gait training;Stair training;Functional mobility training;Therapeutic activities;Therapeutic exercise;Balance training;Patient/family education    PT Goals (Current goals can be found in the Care Plan section)  Acute Rehab PT Goals Patient Stated Goal: to be able to walk again without pain PT Goal Formulation: With patient Time For Goal Achievement: 12/24/19 Potential to Achieve Goals: Good    Frequency 7X/week    AM-PAC PT "6 Clicks" Mobility   Outcome Measure Help needed turning from your back to your side while in a flat bed without using bedrails?: A Little Help needed moving from lying on your back to sitting on the side of a flat bed without using bedrails?: A Little Help needed moving to and from a bed to a chair (including a wheelchair)?: A Lot Help needed standing up from a chair using your arms (e.g., wheelchair or bedside chair)?: A Lot Help needed to walk in hospital room?: A Lot Help needed climbing 3-5 steps with a railing? : A Lot 6 Click Score: 14    End of Session Equipment Utilized During Treatment: Gait belt Activity Tolerance: Patient tolerated treatment well Patient left: in chair;with call bell/phone within reach;with chair alarm set Nurse Communication: Mobility status;Patient requests pain meds PT Visit Diagnosis: Unsteadiness on feet (R26.81);Muscle weakness (generalized) (M62.81);Difficulty in walking, not elsewhere classified (R26.2)    Time: [id number] PT Time Calculation (min) (ACUTE ONLY): 31 min   Charges:   PT Evaluation $PT Eval Low Complexity: 1 Low PT Treatments $Gait Training: 8-22 mins       Verner Mould, DPT Physical Therapist with [facility] [phone]  12/17/2019 4:39 PM

## 2019-12-17 NOTE — Transfer of Care (Signed)
Immediate Anesthesia Transfer of Care Note  Patient: [name] [name]  Procedure(s) Performed: RIGHT TOTAL KNEE ARTHROPLASTY (Right Knee)  Patient Location: PACU  Anesthesia Type:Spinal  Level of Consciousness: awake, alert  and oriented  Airway & Oxygen Therapy: Patient Spontanous Breathing and Patient connected to face mask oxygen  Post-op Assessment: Report given to RN and Post -op Vital signs reviewed and stable  Post vital signs: Reviewed and stable  Last Vitals:  Vitals Value Taken Time  BP    Temp    Pulse 72 12/17/19 1204  Resp 21 12/17/19 1204  SpO2 100 % 12/17/19 1204  Vitals shown include unvalidated device data.  Last Pain:  Vitals:   12/17/19 0905  TempSrc:   PainSc: 0-No pain         Complications: No apparent anesthesia complications

## 2019-12-18 ENCOUNTER — Encounter: Payer: Self-pay | Admitting: *Deleted

## 2019-12-18 LAB — CBC
HCT: 32.6 % — ABNORMAL LOW (ref 36.0–46.0)
Hemoglobin: 10.3 g/dL — ABNORMAL LOW (ref 12.0–15.0)
MCH: 28.5 pg (ref 26.0–34.0)
MCHC: 31.6 g/dL (ref 30.0–36.0)
MCV: 90.1 fL (ref 80.0–100.0)
Platelets: 360 10*3/uL (ref 150–400)
RBC: 3.62 MIL/uL — ABNORMAL LOW (ref 3.87–5.11)
RDW: 13.4 % (ref 11.5–15.5)
WBC: 11.5 10*3/uL — ABNORMAL HIGH (ref 4.0–10.5)
nRBC: 0 % (ref 0.0–0.2)

## 2019-12-18 LAB — BASIC METABOLIC PANEL
Anion gap: 7 (ref 5–15)
BUN: 11 mg/dL (ref 8–23)
CO2: 24 mmol/L (ref 22–32)
Calcium: 8.8 mg/dL — ABNORMAL LOW (ref 8.9–10.3)
Chloride: 100 mmol/L (ref 98–111)
Creatinine, Ser: 0.65 mg/dL (ref 0.44–1.00)
GFR calc Af Amer: >60 mL/min (ref >60)
GFR calc non Af Amer: >60 mL/min (ref >60)
Glucose, Bld: 152 mg/dL — ABNORMAL HIGH (ref 70–99)
Potassium: 4 mmol/L (ref 3.5–5.1)
Sodium: 131 mmol/L — ABNORMAL LOW (ref 135–145)

## 2019-12-18 NOTE — Progress Notes (Signed)
Patient not progressing with therapy, nauseated anxious and crying. Marylou Flesher PA made aware orders received. Bethann Punches RN

## 2019-12-18 NOTE — Plan of Care (Signed)
  Problem: Clinical Measurements: Goal: Respiratory complications will improve Outcome: Progressing   Problem: Clinical Measurements: Goal: Cardiovascular complication will be avoided Outcome: Progressing   Problem: Elimination: Goal: Will not experience complications related to bowel motility Outcome: Progressing   Problem: Activity: Goal: Ability to avoid complications of mobility impairment will improve Outcome: Progressing   Problem: Activity: Goal: Range of joint motion will improve Outcome: Progressing   Problem: Pain Management: Goal: Pain level will decrease with appropriate interventions Outcome: Progressing

## 2019-12-18 NOTE — TOC Transition Note (Signed)
Transition of Care [facility]) - CM/SW Discharge Note   Patient Details  Name: [name] [name] MRN: [id number] Date of Birth: 03-Jan-1951  Transition of Care [facility] CM/SW Contact:  Lia Hopping, [place] Phone Number: 12/18/2019, 10:48 AM   Clinical Narrative:    Therapy Plan: [facility],  [place] and 3 in 1 delivered to bedside.    Final next level of care: [place] Barriers to Discharge: Barriers Resolved   Patient Goals and CMS Choice     Choice offered to / list presented to : NA  Discharge Placement                       Discharge Plan and Services                DME Arranged: 3-N-1, Walker rolling DME Agency: [employer] Date DME Agency Contacted: 12/18/19 Time DME Agency Contacted: 0900 Representative spoke with at DME Agency: Ovid Curd HH Arranged: PT [place]: [facility] [employer] Date [place]: 12/18/19 Time [place]: [place] Representative spoke with at [place]: [place] ([place]) Interventions     Readmission Risk Interventions No flowsheet data found.

## 2019-12-18 NOTE — Progress Notes (Signed)
Physical Therapy Treatment Patient Details Name: [name] [name] MRN: [id number] DOB: June 12, 1951 Today's Date: 12/18/2019    History of Present Illness Patient is 69 y.o. female s/p Rt TKA on 12/17/19 with PMH significant for HTN, HLD, GERD, depression, anxiety, OA, ADHA, incontinence, triple arthrodesis of Rt foot.    PT Comments    POD # 1 am session Assisted OOB to [facility] to amb around bed to recliner. General bed mobility comments: demonstarted and instructed on how to use belt to self assist LE.  General transfer comment: 75% VC's on proper hand placement and increased time due to anxiety.  Easily distracted.  "now what doI do?" Assited from elevated bed to [facility] then to recliner. General Gait Details: 50% VC's on proper walker to self distance and proper sequencing.  Increased anxiety with increased pain or vice versa. Inly amb around the bed to chair. Spouse present during session and observed.    Follow Up Recommendations  Follow surgeon's recommendation for DC plan and follow-up therapies;Home health PT     Equipment Recommendations  Rolling walker with 5" wheels;3in1 (PT)    Recommendations for Other Services       Precautions / Restrictions Precautions Precautions: Fall Precaution Comments: instructed no pillow under knee Restrictions Weight Bearing Restrictions: No Other Position/Activity Restrictions: WBAT    Mobility  Bed Mobility Overal bed mobility: Needs Assistance Bed Mobility: Supine to Sit     Supine to sit: Min assist;HOB elevated     General bed mobility comments: demonstarted and instructed on how to use belt to self assist LE  Transfers Overall transfer level: Needs assistance Equipment used: Rolling walker (2 wheeled) Transfers: Sit to/from Stand Sit to Stand: Min assist         General transfer comment: 75% VC's on proper hand placement and increased time due to anxiety.  Easily distracted.  "now what doI do?" Assited from elevated bed to [facility] then  to recliner.  Ambulation/Gait Ambulation/Gait assistance: Min assist Gait Distance (Feet): 12 Feet Assistive device: Rolling walker (2 wheeled) Gait Pattern/deviations: Step-to pattern;Decreased stride length;Decreased step length - left;Decreased stance time - right;Decreased weight shift to right;Antalgic;Trunk flexed Gait velocity: decreased   General Gait Details: 50% VC's on proper walker to self distance and proper sequencing.  Increased anxiety with increased pain or vice versa. Inly amb around the bed to chair.   Stairs             Wheelchair Mobility    Modified Rankin (Stroke Patients Only)       Balance                                            Cognition Arousal/Alertness: Awake/alert Behavior During Therapy: WFL for tasks assessed/performed;Anxious Overall Cognitive Status: Within Functional Limits for tasks assessed                                 General Comments: increased anxiety with increased pain      Exercises   Total Knee Replacement TE's following HEP handout 10 reps B LE ankle pumps 05 reps towel squeezes 05 reps knee presses  Educated on use of gait belt to assist with TE's Followed by ICE     General Comments        Pertinent Vitals/Pain Pain Assessment: 0-10 Pain  Score: 5  Pain Location: Rt knee Pain Descriptors / Indicators: Aching;Discomfort;Sore Pain Intervention(s): Monitored during session;Premedicated before session;Repositioned;Ice applied    Home Living                      Prior Function            PT Goals (current goals can now be found in the care plan section) Progress towards PT goals: Progressing toward goals    Frequency    7X/week      PT Plan Current plan remains appropriate    Co-evaluation              AM-PAC PT "6 Clicks" Mobility   Outcome Measure  Help needed turning from your back to your side while in a flat bed without using  bedrails?: A Little Help needed moving from lying on your back to sitting on the side of a flat bed without using bedrails?: A Little Help needed moving to and from a bed to a chair (including a wheelchair)?: A Little Help needed standing up from a chair using your arms (e.g., wheelchair or bedside chair)?: A Little Help needed to walk in hospital room?: A Little Help needed climbing 3-5 steps with a railing? : A Lot 6 Click Score: 17    End of Session Equipment Utilized During Treatment: Gait belt Activity Tolerance: No increased pain;Other (comment)(anxiety) Patient left: in chair;with call bell/phone within reach;with chair alarm set Nurse Communication: Mobility status;Patient requests pain meds PT Visit Diagnosis: Unsteadiness on feet (R26.81);Muscle weakness (generalized) (M62.81);Difficulty in walking, not elsewhere classified (R26.2)     Time: 0925-1000 PT Time Calculation (min) (ACUTE ONLY): 35 min  Charges:  $Gait Training: 8-22 mins $Therapeutic Activity: 8-22 mins                     Rica Koyanagi  PTA Acute  Rehabilitation Services Pager      [phone] Office      [phone]

## 2019-12-18 NOTE — Discharge Summary (Signed)
Patient ID: [name] [name] MRN: [id number] DOB/AGE: 10-25-1950 69 y.o.  Admit date: 12/17/2019 Discharge date: 12/18/2019  Admission Diagnoses:  Principal Problem:   Osteoarthritis of right knee Active Problems:   Total knee replacement status, right   Discharge Diagnoses:  Same  Past Medical History:  Diagnosis Date  . ADHD (attention deficit hyperactivity disorder)   . Anxiety   . Arthritis   . Depression   . GERD (gastroesophageal reflux disease)   . History of panic attacks   . Hyperlipidemia   . Hypertension   . Hypothyroidism, postsurgical   . SUI (stress urinary incontinence, female)   . Wears glasses     Surgeries: Procedure(s): RIGHT TOTAL KNEE ARTHROPLASTY on 12/17/2019   Consultants:   Discharged Condition: Improved  Hospital Course: [name] [name] is an 69 y.o. female who was admitted 12/17/2019 for operative treatment ofOsteoarthritis of right knee. Patient has severe unremitting pain that affects sleep, daily activities, and work/hobbies. After pre-op clearance the patient was taken to the operating room on 12/17/2019 and underwent  Procedure(s): RIGHT TOTAL KNEE ARTHROPLASTY.    Patient was given perioperative antibiotics:  Anti-infectives (From admission, onward)   Start     Dose/Rate Route Frequency Ordered Stop   12/17/19 0730  vancomycin (VANCOCIN) IVPB 1000 mg/200 mL premix     1,000 mg 200 mL/hr over 60 Minutes Intravenous On call to O.R. 12/17/19 [id number] 12/17/19 1003       Patient was given sequential compression devices, early ambulation, and chemoprophylaxis to prevent DVT.  Patient benefited maximally from hospital stay and there were no complications.    Recent vital signs:  Patient Vitals for the past 24 hrs:  BP Temp Temp src Pulse Resp SpO2 Height Weight  12/18/19 0615 126/68 98.5 F (36.9 C) no documentation 74 16 98 % no documentation no documentation  12/18/19 0139 105/61 97.9 F (36.6 C) no documentation 83 15 98 % no documentation no  documentation  12/17/19 2301 (Abnormal) 111/54 98 F (36.7 C) no documentation 81 15 99 % no documentation no documentation  12/17/19 1642 117/61 (Abnormal) 97.5 F (36.4 C) Oral 74 16 99 % no documentation no documentation  12/17/19 1538 (Abnormal) 123/52 97.7 F (36.5 C) no documentation 75 14 99 % no documentation no documentation  12/17/19 1431 116/63 97.9 F (36.6 C) Oral 73 14 100 % no documentation no documentation  12/17/19 1334 (Abnormal) 119/59 97.8 F (36.6 C) Oral 72 16 100 % 5\' 3"  (1.6 m) 103 kg  12/17/19 1315 no documentation (Abnormal) 97.4 F (36.3 C) no documentation no documentation no documentation no documentation no documentation no documentation  12/17/19 1300 no documentation (Abnormal) 97.4 F (36.3 C) no documentation no documentation no documentation no documentation no documentation no documentation  12/17/19 1230 124/68 no documentation no documentation 66 11 97 % no documentation no documentation  12/17/19 1205 109/60 97.6 F (36.4 C) no documentation 73 (Abnormal) 24 100 % no documentation no documentation  12/17/19 0924 no documentation no documentation no documentation 71 16 100 % no documentation no documentation  12/17/19 0923 no documentation no documentation no documentation 70 17 100 % no documentation no documentation  12/17/19 0922 no documentation no documentation no documentation 70 18 100 % no documentation no documentation  12/17/19 0921 no documentation no documentation no documentation 69 10 100 % no documentation no documentation  12/17/19 0920 (Abnormal) 114/53 no documentation no documentation 71 15 100 % no documentation no documentation  12/17/19 0919 no documentation no documentation no  documentation 74 18 100 % no documentation no documentation  12/17/19 0918 no documentation no documentation no documentation 72 19 99 % no documentation no documentation  12/17/19 0917 no documentation no documentation no documentation 70 19 99 % no  documentation no documentation  12/17/19 0916 no documentation no documentation no documentation 71 (Abnormal) 21 98 % no documentation no documentation  12/17/19 0915 110/62 no documentation no documentation 69 18 98 % no documentation no documentation  12/17/19 0914 no documentation no documentation no documentation 70 16 98 % no documentation no documentation  12/17/19 0913 no documentation no documentation no documentation 70 (Abnormal) 21 96 % no documentation no documentation  12/17/19 0912 no documentation no documentation no documentation 67 18 97 % no documentation no documentation  12/17/19 0911 no documentation no documentation no documentation 69 (Abnormal) 22 96 % no documentation no documentation  12/17/19 0910 (Abnormal) 115/54 no documentation no documentation 69 16 95 % no documentation no documentation  12/17/19 0909 no documentation no documentation no documentation 68 17 95 % no documentation no documentation  12/17/19 0908 no documentation no documentation no documentation 68 16 95 % no documentation no documentation  12/17/19 0907 no documentation no documentation no documentation 67 18 93 % no documentation no documentation  12/17/19 0906 no documentation no documentation no documentation 69 19 92 % no documentation no documentation  12/17/19 0905 109/60 no documentation no documentation 68 19 92 % no documentation no documentation  12/17/19 0904 no documentation no documentation no documentation 67 15 92 % no documentation no documentation  12/17/19 0903 no documentation no documentation no documentation 67 (Abnormal) 8 94 % no documentation no documentation  12/17/19 0902 no documentation no documentation no documentation 66 17 94 % no documentation no documentation  12/17/19 0901 no documentation no documentation no documentation 68 14 100 % no documentation no documentation  12/17/19 0900 116/61 no documentation no documentation 71 16 100 % no documentation no  documentation  12/17/19 0859 no documentation no documentation no documentation 73 11 100 % no documentation no documentation  12/17/19 0858 130/65 no documentation no documentation no documentation no documentation no documentation no documentation no documentation  12/17/19 0759 no documentation no documentation no documentation no documentation no documentation no documentation no documentation 101.4 kg  12/17/19 0750 133/64 98.3 F (36.8 C) Oral 79 16 99 % no documentation no documentation     Recent laboratory studies:  Recent Labs    12/18/19 0310  WBC 11.5*  HGB 10.3*  HCT 32.6*  PLT 360  NA 131*  K 4.0  CL 100  CO2 24  BUN 11  CREATININE 0.65  GLUCOSE 152*  CALCIUM 8.8*     Discharge Medications:   Allergies as of 12/18/2019    Allergen Reactions Comment   Ceclor [cefaclor] Hives, Shortness Of Breath    Bactrim [sulfamethoxazole-trimethoprim] Hives    Gabapentin Swelling       Medication List    Stop taking these medications   cyclobenzaprine 10 MG tablet Commonly known as: FLEXERIL   ibuprofen 200 MG tablet Commonly known as: ADVIL   traMADol 50 MG tablet Commonly known as: ULTRAM     Take these medications   ALPRAZolam 0.5 MG tablet Commonly known as: XANAX Take 0.5 mg by mouth daily as needed for anxiety or sleep.   aspirin EC 81 MG tablet Take 1 tablet (81 mg total) by mouth 2 (two) times daily.   buPROPion 300 MG 24 hr tablet Commonly known as: WELLBUTRIN XL  Take 300 mg by mouth daily.   DULoxetine 60 MG capsule Commonly known as: CYMBALTA Take 120 mg by mouth daily.   irbesartan 150 MG tablet Commonly known as: AVAPRO Take 150 mg by mouth daily.   levothyroxine 125 MCG tablet Commonly known as: SYNTHROID Take 125 mcg by mouth daily before breakfast.   lisdexamfetamine 50 MG capsule Commonly known as: VYVANSE Take 50 mg by mouth daily.   omeprazole 20 MG capsule Commonly known as: PRILOSEC Take 20 mg by mouth daily before  breakfast.   OVER THE COUNTER MEDICATION Apply 1 application topically daily as needed (pain). Magnesium topical cream   oxyCODONE-acetaminophen 5-325 MG tablet Commonly known as: PERCOCET/ROXICET Take 1 tablet by mouth every 4 (four) hours as needed for severe pain.   simvastatin 40 MG tablet Commonly known as: ZOCOR Take 40 mg by mouth daily.   tiZANidine 2 MG tablet Commonly known as: ZANAFLEX Take 1 tablet (2 mg total) by mouth every 6 (six) hours as needed.   torsemide 10 MG tablet Commonly known as: DEMADEX Take 10 mg by mouth daily as needed (swelling).        Durable Medical Equipment  (From admission, onward)         Start     Ordered   12/17/19 1342  DME Walker rolling  Once    Question:  Patient needs a walker to treat with the following condition  Answer:  Status post right knee replacement   12/17/19 1341   12/17/19 1342  DME 3 n 1  Once     12/17/19 1341           Discharge Care Instructions  (From admission, onward)         Start     Ordered   12/18/19 0000  Change dressing    Comments: Change dressing Only if drainage exceeds 40% of window on dressing   12/18/19 0741          Diagnostic Studies: DG Chest 2 View  Result Date: 12/13/2019 CLINICAL DATA:  Preoperative for right knee replacement EXAM: CHEST - 2 VIEW COMPARISON:  01/01/2017 chest radiograph. FINDINGS: Stable cardiomediastinal silhouette with normal heart size. No pneumothorax. No pleural effusion. Lungs appear clear, with no acute consolidative airspace disease and no pulmonary edema. IMPRESSION: No active cardiopulmonary disease. Electronically Signed   By: Ilona Sorrel M.D.   On: 12/13/2019 16:50   VAS [place] LOWER EXTREMITY VENOUS (DVT)  Result Date: 11/25/2019  Lower Venous DVTStudy Indications: Pain, and Swelling.  Comparison Study: No prior study on file for comparison Performing Technologist: Sharion Dove RVS  Examination Guidelines: A complete evaluation includes B-mode  imaging, spectral Doppler, color Doppler, and power Doppler as needed of all accessible portions of each vessel. Bilateral testing is considered an integral part of a complete examination. Limited examinations for reoccurring indications may be performed as noted. The reflux portion of the exam is performed with the patient in reverse Trendelenburg.  +---------+---------------+---------+-----------+----------+--------------+ RIGHT    CompressibilityPhasicitySpontaneityPropertiesThrombus Aging +---------+---------------+---------+-----------+----------+--------------+ CFV      Full           Yes      Yes                                 +---------+---------------+---------+-----------+----------+--------------+ SFJ      Full                                                        +---------+---------------+---------+-----------+----------+--------------+  FV Prox  Full                                                        +---------+---------------+---------+-----------+----------+--------------+ FV Mid   Full                                                        +---------+---------------+---------+-----------+----------+--------------+ FV DistalFull                                                        +---------+---------------+---------+-----------+----------+--------------+ PFV      Full                                                        +---------+---------------+---------+-----------+----------+--------------+ POP      Full           Yes      Yes                                 +---------+---------------+---------+-----------+----------+--------------+ PTV      Full                                                        +---------+---------------+---------+-----------+----------+--------------+ PERO     Full                                                        +---------+---------------+---------+-----------+----------+--------------+    +---------+---------------+---------+-----------+----------+--------------+ LEFT     CompressibilityPhasicitySpontaneityPropertiesThrombus Aging +---------+---------------+---------+-----------+----------+--------------+ CFV      Full           Yes      Yes                                 +---------+---------------+---------+-----------+----------+--------------+ SFJ      Full                                                        +---------+---------------+---------+-----------+----------+--------------+ FV Prox  Full                                                        +---------+---------------+---------+-----------+----------+--------------+  FV Mid   Full                                                        +---------+---------------+---------+-----------+----------+--------------+ FV DistalFull                                                        +---------+---------------+---------+-----------+----------+--------------+ PFV      Full                                                        +---------+---------------+---------+-----------+----------+--------------+ POP      Full           Yes      Yes                                 +---------+---------------+---------+-----------+----------+--------------+ PTV      Full                                                        +---------+---------------+---------+-----------+----------+--------------+ PERO     Full                                                        +---------+---------------+---------+-----------+----------+--------------+     Summary: BILATERAL: - No evidence of deep vein thrombosis seen in the lower extremities, bilaterally.  RIGHT: - An intact cystic structure is found in the popliteal fossa.  LEFT: - No cystic structure found in the popliteal fossa.  *See table(s) above for measurements and observations. Electronically signed by Servando Snare MD on 11/25/2019 at  1:41:01 PM.    Final     Disposition: Discharge disposition: 01-Home or Self Care       Discharge Instructions    Call MD / Call 911   Complete by: As directed    If you experience chest pain or shortness of breath, CALL 911 and be transported to the hospital emergency room.  If you develope a fever above 101 F, pus (white drainage) or increased drainage or redness at the wound, or calf pain, call your surgeon's office.   Change dressing   Complete by: As directed    Change dressing Only if drainage exceeds 40% of window on dressing   Constipation Prevention   Complete by: As directed    Drink plenty of fluids.  Prune juice may be helpful.  You may use a stool softener, such as Colace (over the counter) 100 mg twice a day.  Use MiraLax (over the counter) for constipation as needed.   Diet - low sodium heart healthy   Complete by:  As directed    Increase activity slowly as tolerated   Complete by: As directed       Follow-up Information    Frederik Pear, MD In 2 weeks.   Specialty: Orthopedic Surgery Contact information: [place] [address] [phone]            Signed: Kerin Salen 12/18/2019, 7:42 AM

## 2019-12-18 NOTE — Progress Notes (Signed)
PHYSICAL THERAPY  Pm session withheld due to pain level as well as c/o nausea.  Pt unable to D/C to home today so will see in am.  Pt and spouse agree.  Rica Koyanagi  PTA Acute  Rehabilitation Services Pager      [phone] Office      [phone]

## 2019-12-18 NOTE — Progress Notes (Signed)
PATIENT ID: [name] [name]  MRN: [id number]  DOB/AGE:  [birth year] / 68 y.o.  1 Day Post-Op Procedure(s) (LRB): RIGHT TOTAL KNEE ARTHROPLASTY (Right)    PROGRESS NOTE Subjective: Patient is alert, oriented, no Nausea, no Vomiting, yes passing gas, . Taking PO well. Denies SOB, Chest or Calf Pain. Using Incentive Spirometer, PAS in place. Ambulate 30' Patient reports pain as  4/10  .    Objective: Vital signs in last 24 hours: Vitals:   12/17/19 1642 12/17/19 2301 12/18/19 0139 12/18/19 0615  BP: 117/61 (Abnormal) 111/54 105/61 126/68  Pulse: 74 81 83 74  Resp: 16 15 15 16   Temp: (Abnormal) 97.5 F (36.4 C) 98 F (36.7 C) 97.9 F (36.6 C) 98.5 F (36.9 C)  TempSrc: Oral     SpO2: 99% 99% 98% 98%  Weight:      Height:          Intake/Output from previous day: I/O last 3 completed shifts: In: [id number] [P.O.:480; I.V.:3123; IV Piggyback:100] Out: [id number] [Urine:4075; Blood:300]   Intake/Output this shift: No intake/output data recorded.   LABORATORY DATA: Recent Labs    12/18/19 0310  WBC 11.5*  HGB 10.3*  HCT 32.6*  PLT 360  NA 131*  K 4.0  CL 100  CO2 24  BUN 11  CREATININE 0.65  GLUCOSE 152*  CALCIUM 8.8*    Examination: Neurologically intact ABD soft Neurovascular intact Sensation intact distally Intact pulses distally Dorsiflexion/Plantar flexion intact Incision: dressing C/D/I No cellulitis present Compartment soft} XR AP&Lat of hip shows well placed\fixed THA  Assessment:   1 Day Post-Op Procedure(s) (LRB): RIGHT TOTAL KNEE ARTHROPLASTY (Right) ADDITIONAL DIAGNOSIS:  Expected Acute Blood Loss Anemia, obesity  Patient's anticipated LOS is less than 2 midnights, meeting these requirements: - Younger than 92 - Lives within 1 hour of care - Has a competent adult at home to recover with post-op recover - NO history of  - Chronic pain requiring opiods  - Diabetes  - Coronary Artery Disease  - Heart failure  - Heart attack  - Stroke  - DVT/VTE  -  Cardiac arrhythmia  - Respiratory Failure/COPD  - Renal failure  - Anemia  - Advanced Liver disease       Plan: PT/OT WBAT, THA  DVT Prophylaxis: SCDx72 hrs, ASA 81 mg BID x 2 weeks  DISCHARGE PLAN: Home, today if passes PT  DISCHARGE NEEDS: HHPT, Walker and 3-in-1 comode seatPatient ID: [name] [name], female   DOB: Nov 07, 1950, 69 y.o.   MRN: [id number]

## 2019-12-19 DIAGNOSIS — F329 Major depressive disorder, single episode, unspecified: Secondary | ICD-10-CM | POA: Diagnosis present

## 2019-12-19 DIAGNOSIS — Z6838 Body mass index (BMI) 38.0-38.9, adult: Secondary | ICD-10-CM | POA: Diagnosis not present

## 2019-12-19 DIAGNOSIS — I10 Essential (primary) hypertension: Secondary | ICD-10-CM | POA: Diagnosis present

## 2019-12-19 DIAGNOSIS — Z79899 Other long term (current) drug therapy: Secondary | ICD-10-CM | POA: Diagnosis not present

## 2019-12-19 DIAGNOSIS — F419 Anxiety disorder, unspecified: Secondary | ICD-10-CM | POA: Diagnosis present

## 2019-12-19 DIAGNOSIS — E7800 Pure hypercholesterolemia, unspecified: Secondary | ICD-10-CM | POA: Diagnosis present

## 2019-12-19 DIAGNOSIS — Z833 Family history of diabetes mellitus: Secondary | ICD-10-CM | POA: Diagnosis not present

## 2019-12-19 DIAGNOSIS — Z803 Family history of malignant neoplasm of breast: Secondary | ICD-10-CM | POA: Diagnosis not present

## 2019-12-19 DIAGNOSIS — Z96651 Presence of right artificial knee joint: Secondary | ICD-10-CM

## 2019-12-19 DIAGNOSIS — Z87891 Personal history of nicotine dependence: Secondary | ICD-10-CM | POA: Diagnosis not present

## 2019-12-19 DIAGNOSIS — Z888 Allergy status to other drugs, medicaments and biological substances status: Secondary | ICD-10-CM | POA: Diagnosis not present

## 2019-12-19 DIAGNOSIS — Z881 Allergy status to other antibiotic agents status: Secondary | ICD-10-CM | POA: Diagnosis not present

## 2019-12-19 DIAGNOSIS — K219 Gastro-esophageal reflux disease without esophagitis: Secondary | ICD-10-CM | POA: Diagnosis present

## 2019-12-19 DIAGNOSIS — Z8249 Family history of ischemic heart disease and other diseases of the circulatory system: Secondary | ICD-10-CM | POA: Diagnosis not present

## 2019-12-19 DIAGNOSIS — E890 Postprocedural hypothyroidism: Secondary | ICD-10-CM | POA: Diagnosis present

## 2019-12-19 DIAGNOSIS — D62 Acute posthemorrhagic anemia: Secondary | ICD-10-CM | POA: Diagnosis not present

## 2019-12-19 DIAGNOSIS — Z8049 Family history of malignant neoplasm of other genital organs: Secondary | ICD-10-CM | POA: Diagnosis not present

## 2019-12-19 DIAGNOSIS — E669 Obesity, unspecified: Secondary | ICD-10-CM | POA: Diagnosis present

## 2019-12-19 DIAGNOSIS — M1711 Unilateral primary osteoarthritis, right knee: Secondary | ICD-10-CM | POA: Diagnosis present

## 2019-12-19 DIAGNOSIS — E785 Hyperlipidemia, unspecified: Secondary | ICD-10-CM | POA: Diagnosis present

## 2019-12-19 DIAGNOSIS — F909 Attention-deficit hyperactivity disorder, unspecified type: Secondary | ICD-10-CM | POA: Diagnosis present

## 2019-12-19 DIAGNOSIS — Z79890 Hormone replacement therapy (postmenopausal): Secondary | ICD-10-CM | POA: Diagnosis not present

## 2019-12-19 LAB — CBC
HCT: 36.2 % (ref 36.0–46.0)
Hemoglobin: 11.4 g/dL — ABNORMAL LOW (ref 12.0–15.0)
MCH: 27.9 pg (ref 26.0–34.0)
MCHC: 31.5 g/dL (ref 30.0–36.0)
MCV: 88.5 fL (ref 80.0–100.0)
Platelets: 352 10*3/uL (ref 150–400)
RBC: 4.09 MIL/uL (ref 3.87–5.11)
RDW: 13.4 % (ref 11.5–15.5)
WBC: 12.5 10*3/uL — ABNORMAL HIGH (ref 4.0–10.5)
nRBC: 0 % (ref 0.0–0.2)

## 2019-12-19 MED ORDER — CELECOXIB 200 MG PO CAPS
200.0000 mg | ORAL_CAPSULE | Freq: Two times a day (BID) | ORAL | 2 refills | Status: DC
Start: 2019-12-19 — End: 2020-01-21

## 2019-12-19 NOTE — Progress Notes (Signed)
Physical Therapy Treatment Patient Details Name: [name] [name] MRN: [id number] DOB: 11/20/1950 Today's Date: 12/19/2019    History of Present Illness Patient is 69 y.o. female s/p Rt TKA on 12/17/19 with PMH significant for HTN, HLD, GERD, depression, anxiety, OA, ADHA, incontinence, triple arthrodesis of Rt foot.    PT Comments    POD # 2 am session Assisted OOB.  General bed mobility comments: demonstarted and instructed on how to use belt to self assist LE.  Pt was self able with increased time. General transfer comment: 25% VC's on proper hand placement and increased time.  "I'm coming" "wait"  General Gait Details: 25% VC's on proper sequencing and upright posture.  Tolerated an increased distance. General stair comments: with spouse present for "hands on" instruction and 50% VC's on proper tech up/down 2 steps with B rails.  Then returned to room to perform some TE's following HEP handout.  Instructed on proper tech, freq as well as use of ICE.  Assisted back to bed per pt request to rest.  Pt will need anothter PT session prior to safe D/C to home.    Follow Up Recommendations  Follow surgeon's recommendation for DC plan and follow-up therapies;Home health PT     Equipment Recommendations  Rolling walker with 5" wheels;3in1 (PT)    Recommendations for Other Services       Precautions / Restrictions Precautions Precautions: Fall Precaution Comments: instructed no pillow under knee Restrictions Weight Bearing Restrictions: No Other Position/Activity Restrictions: WBAT    Mobility  Bed Mobility Overal bed mobility: Needs Assistance Bed Mobility: Supine to Sit     Supine to sit: Min guard     General bed mobility comments: demonstarted and instructed on how to use belt to self assist LE.  Pt was self able with increased time.  Transfers Overall transfer level: Needs assistance Equipment used: Rolling walker (2 wheeled) Transfers: Sit to/from Stand Sit to Stand:  Supervision;Min guard         General transfer comment: 25% VC's on proper hand placement and increased time.  "I'm coming" "wait"  Ambulation/Gait Ambulation/Gait assistance: Supervision Gait Distance (Feet): 35 Feet Assistive device: Rolling walker (2 wheeled) Gait Pattern/deviations: Step-to pattern;Decreased stride length;Decreased step length - left;Decreased stance time - right;Decreased weight shift to right Gait velocity: decreased   General Gait Details: 25% VC's on proper sequencing and upright posture.  Tolerated an increased distance.   Stairs Stairs: Yes Stairs assistance: Min guard;Min assist Stair Management: Two rails;Step to pattern;Forwards Number of Stairs: 2 General stair comments: with spouse present for "hands on" instruction and 50% VC's on proper tech up/down 2 steps with B rails.   Wheelchair Mobility    Modified Rankin (Stroke Patients Only)       Balance                                            Cognition Arousal/Alertness: Awake/alert Behavior During Therapy: WFL for tasks assessed/performed;Anxious Overall Cognitive Status: Within Functional Limits for tasks assessed                                 General Comments: decreased pain and anxiety today      Exercises   Total Knee Replacement TE's following HEP handout 10 reps B LE ankle pumps 05 reps towel  squeezes 05 reps knee presses 05 reps heel slides  05 reps SAQ's  Educated on use of gait belt to assist with TE's Followed by ICE     General Comments        Pertinent Vitals/Pain Pain Assessment: 0-10 Pain Score: 5  Pain Location: Rt knee Pain Descriptors / Indicators: Aching;Discomfort;Sore Pain Intervention(s): Monitored during session;Premedicated before session;Repositioned;Ice applied    Home Living                      Prior Function            PT Goals (current goals can now be found in the care plan section)  Progress towards PT goals: Progressing toward goals    Frequency    7X/week      PT Plan Current plan remains appropriate    Co-evaluation              AM-PAC PT "6 Clicks" Mobility   Outcome Measure  Help needed turning from your back to your side while in a flat bed without using bedrails?: A Little Help needed moving from lying on your back to sitting on the side of a flat bed without using bedrails?: A Little Help needed moving to and from a bed to a chair (including a wheelchair)?: A Little Help needed standing up from a chair using your arms (e.g., wheelchair or bedside chair)?: A Little Help needed to walk in hospital room?: A Little Help needed climbing 3-5 steps with a railing? : A Little 6 Click Score: 18    End of Session Equipment Utilized During Treatment: Gait belt Activity Tolerance: Patient tolerated treatment well Patient left: with call bell/phone within reach;with chair alarm set;in bed Nurse Communication: Mobility status(pt will need another PT session prior to D/C) PT Visit Diagnosis: Unsteadiness on feet (R26.81);Muscle weakness (generalized) (M62.81);Difficulty in walking, not elsewhere classified (R26.2)     Time: [id number] PT Time Calculation (min) (ACUTE ONLY): 60 min  Charges:  $Gait Training: 23-37 mins $Therapeutic Exercise: 8-22 mins $Therapeutic Activity: 8-22 mins                     Rica Koyanagi  PTA Acute  Rehabilitation Services Pager      [phone] Office      [phone]

## 2019-12-19 NOTE — Progress Notes (Signed)
PATIENT ID: [name] [name]  MRN: [id number]  DOB/AGE:  Nov 04, 1950 / 68 y.o.  2 Days Post-Op Procedure(s) (LRB): RIGHT TOTAL KNEE ARTHROPLASTY (Right)    PROGRESS NOTE Subjective: Patient is alert, oriented, no Nausea, no Vomiting, yes passing gas. Taking PO well. Denies SOB, Chest or Calf Pain. Using Incentive Spirometer, PAS in place. Ambulate WBAT with pt having difficulty with pain and nausea yesterday, Patient reports pain as 1/10 at rest .    Objective: Vital signs in last 24 hours: Vitals:   12/18/19 1407 12/18/19 1709 12/18/19 2222 12/19/19 0447  BP: (!) 147/56 132/69 (!) 167/70 (!) 161/68  Pulse: 68 69 76 82  Resp: 12 14 14 16   Temp: 97.6 F (36.4 C) 97.8 F (36.6 C) 98.3 F (36.8 C) 98 F (36.7 C)  TempSrc:   Oral   SpO2: 100% 98% 99% 99%  Weight:      Height:          Intake/Output from previous day: I/O last 3 completed shifts: In: 2655.2 [P.O.:960; I.V.:1695.2] Out: [id number] [Urine:5250]   Intake/Output this shift: No intake/output data recorded.   LABORATORY DATA: Recent Labs    12/18/19 0310 12/19/19 0256  WBC 11.5* 12.5*  HGB 10.3* 11.4*  HCT 32.6* 36.2  PLT 360 352  NA 131*  --   K 4.0  --   CL 100  --   CO2 24  --   BUN 11  --   CREATININE 0.65  --   GLUCOSE 152*  --   CALCIUM 8.8*  --     Examination: Neurologically intact Neurovascular intact Sensation intact distally Intact pulses distally Dorsiflexion/Plantar flexion intact Incision: dressing C/D/I and no drainage No cellulitis present Compartment soft}  Assessment:   2 Days Post-Op Procedure(s) (LRB): RIGHT TOTAL KNEE ARTHROPLASTY (Right) ADDITIONAL DIAGNOSIS: Expected Acute Blood Loss Anemia, obesity Anticipated LOS equal to or greater than 2 midnights due to - Age 46 and older with one or more of the following:  - Obesity  - Expected need for hospital services (PT, OT, Nursing) required for safe  discharge  - Anticipated need for postoperative skilled nursing care or inpatient  rehab   OR   - Unanticipated findings during/Post Surgery: Slow post-op progression: GI, pain control, mobility       Plan: PT/OT WBAT, AROM and PROM  DVT Prophylaxis:  SCDx72hrs, ASA 81 mg BID x 2 weeks DISCHARGE PLAN: Home, when pt meets therapy goals DISCHARGE NEEDS: HHPT, HHRN, Walker and 3-in-1 comode seat     Joanell Rising 12/19/2019, 8:46 AM

## 2019-12-19 NOTE — Progress Notes (Signed)
Physical Therapy Treatment Patient Details Name: [name] [name] MRN: [id number] DOB: January 26, 1951 Today's Date: 12/19/2019    History of Present Illness Patient is 69 y.o. female s/p Rt TKA on 12/17/19 with PMH significant for HTN, HLD, GERD, depression, anxiety, OA, ADHA, incontinence, triple arthrodesis of Rt foot.    PT Comments    POD # 2 pm session Assisted OOB to [facility].  General bed mobility comments: demonstarted and instructed on how to use belt to self assist LE.  Pt was self able with increased time.  Spouse also assisting. General transfer comment: 25% VC's on proper hand placement and increased time. Assisted with a wash up and dressing.  Assisted from elevated bed to [facility] then off BSC to amb.General Gait Details: 25% VC's on proper sequencing and upright posture.  Distance limited in room this session. Assisted to recliner and reviewed HEP.  Addressed all mobility questions, discussed appropriate activity, educated on use of ICE.  Pt ready for D/C to home. Pt plans to return in approx 3 months for the other knee.    Follow Up Recommendations  Follow surgeon's recommendation for DC plan and follow-up therapies;Home health PT     Equipment Recommendations  Rolling walker with 5" wheels;3in1 (PT)    Recommendations for Other Services       Precautions / Restrictions Precautions Precautions: Fall Precaution Comments: instructed no pillow under knee Restrictions Weight Bearing Restrictions: No Other Position/Activity Restrictions: WBAT    Mobility  Bed Mobility Overal bed mobility: Needs Assistance Bed Mobility: Supine to Sit     Supine to sit: Min guard     General bed mobility comments: demonstarted and instructed on how to use belt to self assist LE.  Pt was self able with increased time.  Spouse also assisting.  Transfers Overall transfer level: Needs assistance Equipment used: Rolling walker (2 wheeled) Transfers: Sit to/from Stand Sit to Stand: Supervision;Min  guard         General transfer comment: 25% VC's on proper hand placement and increased time. Assisted from elevated bed to [facility] then off BSC to amb.  Ambulation/Gait Ambulation/Gait assistance: Supervision Gait Distance (Feet): 3 Feet Assistive device: Rolling walker (2 wheeled) Gait Pattern/deviations: Step-to pattern;Decreased stride length;Decreased step length - left;Decreased stance time - right;Decreased weight shift to right Gait velocity: decreased   General Gait Details: 25% VC's on proper sequencing and upright posture.  Distance limited in room this session.   Stairs  Wheelchair Mobility    Modified Rankin (Stroke Patients Only)       Balance                                            Cognition Arousal/Alertness: Awake/alert Behavior During Therapy: WFL for tasks assessed/performed;Anxious Overall Cognitive Status: Within Functional Limits for tasks assessed                                 General Comments: decreased pain and anxiety today      Exercises      General Comments        Pertinent Vitals/Pain Pain Assessment: 0-10 Pain Score: 5  Pain Location: Rt knee Pain Descriptors / Indicators: Aching;Discomfort;Sore Pain Intervention(s): Monitored during session;Premedicated before session;Repositioned;Ice applied    Home Living  Prior Function            PT Goals (current goals can now be found in the care plan section) Progress towards PT goals: Progressing toward goals    Frequency    7X/week      PT Plan Current plan remains appropriate    Co-evaluation              AM-PAC PT "6 Clicks" Mobility   Outcome Measure  Help needed turning from your back to your side while in a flat bed without using bedrails?: A Little Help needed moving from lying on your back to sitting on the side of a flat bed without using bedrails?: A Little Help needed moving to and from a  bed to a chair (including a wheelchair)?: A Little Help needed standing up from a chair using your arms (e.g., wheelchair or bedside chair)?: A Little Help needed to walk in hospital room?: A Little Help needed climbing 3-5 steps with a railing? : A Little 6 Click Score: 18    End of Session Equipment Utilized During Treatment: Gait belt Activity Tolerance: Patient tolerated treatment well Patient left: in chair Nurse Communication: Mobility status(pt will need another PT session prior to D/C) PT Visit Diagnosis: Unsteadiness on feet (R26.81);Muscle weakness (generalized) (M62.81);Difficulty in walking, not elsewhere classified (R26.2)     Time: 1400-1440 PT Time Calculation (min) (ACUTE ONLY): 40 min  Charges:  $Gait Training: 8-22 mins $Therapeutic Activity: 8-22 mins $Self Care/Home Management: 8-22                     Rica Koyanagi  PTA Acute  Rehabilitation Services Pager      [phone] Office      [phone]

## 2019-12-19 NOTE — TOC Progression Note (Signed)
Transition of Care [facility]) - Progression Note    Patient Details  Name: [name] [name] MRN: [id number] Date of Birth: [birth year]  Transition of Care [facility]) CM/SW [place], [place] Phone Number: 12/19/2019, 9:58 AM  Clinical Narrative:    Home Health agency notified the patient will need a nurse aide.  Has RW and 3 in 1    Barriers to Discharge: Barriers Resolved  Expected Discharge Plan and Services           Expected Discharge Date: 12/19/19               DME Arranged: 3-N-1, Walker rolling DME Agency: [employer] Date DME Agency Contacted: 12/18/19 Time DME Agency Contacted: 0900 Representative spoke with at DME Agency: Ovid Curd HH Arranged: PT [place]: [facility] (formerly [employer]) Date [place]: 12/18/19 Time [place]: [place] Representative spoke with at [place]: [place] ([place]) Interventions    Readmission Risk Interventions No flowsheet data found.

## 2019-12-19 NOTE — Discharge Summary (Signed)
Patient ID: [name] [name] MRN: [id number] DOB/AGE: [birth year] 69 y.o.  Admit date: 12/17/2019 Discharge date: 12/19/2019  Admission Diagnoses:  Principal Problem:   Osteoarthritis of right knee Active Problems:   Total knee replacement status, right   Discharge Diagnoses:  Same  Past Medical History:  Diagnosis Date  . ADHD (attention deficit hyperactivity disorder)   . Anxiety   . Arthritis   . Depression   . GERD (gastroesophageal reflux disease)   . History of panic attacks   . Hyperlipidemia   . Hypertension   . Hypothyroidism, postsurgical   . SUI (stress urinary incontinence, female)   . Wears glasses     Surgeries: Procedure(s): RIGHT TOTAL KNEE ARTHROPLASTY on 12/17/2019   Consultants:   Discharged Condition: Improved  Hospital Course: [name] [name] is an 69 y.o. female who was admitted 12/17/2019 for operative treatment ofOsteoarthritis of right knee. Patient has severe unremitting pain that affects sleep, daily activities, and work/hobbies. After pre-op clearance the patient was taken to the operating room on 12/17/2019 and underwent  Procedure(s): RIGHT TOTAL KNEE ARTHROPLASTY.    Patient was given perioperative antibiotics:  Anti-infectives (From admission, onward)   Start     Dose/Rate Route Frequency Ordered Stop   12/17/19 0730  vancomycin (VANCOCIN) IVPB 1000 mg/200 mL premix     1,000 mg 200 mL/hr over 60 Minutes Intravenous On call to O.R. 12/17/19 [id number] 12/17/19 1003       Patient was given sequential compression devices, early ambulation, and chemoprophylaxis to prevent DVT.  Patient benefited maximally from hospital stay and there were no complications.    Recent vital signs:  Patient Vitals for the past 24 hrs:  BP Temp Temp src Pulse Resp SpO2  12/19/19 0447 (!) 161/68 98 F (36.7 C) -- 82 16 99 %  12/18/19 2222 (!) 167/70 98.3 F (36.8 C) Oral 76 14 99 %  12/18/19 1709 132/69 97.8 F (36.6 C) -- 69 14 98 %  12/18/19 1407 (!) 147/56 97.6 F  (36.4 C) -- 68 12 100 %  12/18/19 1023 (!) 121/55 (!) 97.4 F (36.3 C) -- 65 14 99 %     Recent laboratory studies:  Recent Labs    12/18/19 0310 12/19/19 0256  WBC 11.5* 12.5*  HGB 10.3* 11.4*  HCT 32.6* 36.2  PLT 360 352  NA 131*  --   K 4.0  --   CL 100  --   CO2 24  --   BUN 11  --   CREATININE 0.65  --   GLUCOSE 152*  --   CALCIUM 8.8*  --      Discharge Medications:   Allergies as of 12/19/2019      Reactions   Ceclor [cefaclor] Hives, Shortness Of Breath   Bactrim [sulfamethoxazole-trimethoprim] Hives   Gabapentin Swelling      Medication List    STOP taking these medications   cyclobenzaprine 10 MG tablet Commonly known as: FLEXERIL   ibuprofen 200 MG tablet Commonly known as: ADVIL   traMADol 50 MG tablet Commonly known as: ULTRAM     TAKE these medications   ALPRAZolam 0.5 MG tablet Commonly known as: XANAX Take 0.5 mg by mouth daily as needed for anxiety or sleep.   aspirin EC 81 MG tablet Take 1 tablet (81 mg total) by mouth 2 (two) times daily.   buPROPion 300 MG 24 hr tablet Commonly known as: WELLBUTRIN XL Take 300 mg by mouth daily.   celecoxib 200 MG capsule  Commonly known as: CeleBREX Take 1 capsule (200 mg total) by mouth 2 (two) times daily.   DULoxetine 60 MG capsule Commonly known as: CYMBALTA Take 120 mg by mouth daily.   irbesartan 150 MG tablet Commonly known as: AVAPRO Take 150 mg by mouth daily.   levothyroxine 125 MCG tablet Commonly known as: SYNTHROID Take 125 mcg by mouth daily before breakfast.   lisdexamfetamine 50 MG capsule Commonly known as: VYVANSE Take 50 mg by mouth daily.   omeprazole 20 MG capsule Commonly known as: PRILOSEC Take 20 mg by mouth daily before breakfast.   OVER THE COUNTER MEDICATION Apply 1 application topically daily as needed (pain). Magnesium topical cream   oxyCODONE-acetaminophen 5-325 MG tablet Commonly known as: PERCOCET/ROXICET Take 1 tablet by mouth every 4 (four)  hours as needed for severe pain.   simvastatin 40 MG tablet Commonly known as: ZOCOR Take 40 mg by mouth daily.   tiZANidine 2 MG tablet Commonly known as: ZANAFLEX Take 1 tablet (2 mg total) by mouth every 6 (six) hours as needed.   torsemide 10 MG tablet Commonly known as: DEMADEX Take 10 mg by mouth daily as needed (swelling).            Durable Medical Equipment  (From admission, onward)         Start     Ordered   12/17/19 1342  DME Walker rolling  Once    Question:  Patient needs a walker to treat with the following condition  Answer:  Status post right knee replacement   12/17/19 1341   12/17/19 1342  DME 3 n 1  Once     12/17/19 1341           Discharge Care Instructions  (From admission, onward)         Start     Ordered   12/18/19 0000  Change dressing    Comments: Change dressing Only if drainage exceeds 40% of window on dressing   12/18/19 0741          Diagnostic Studies: DG Chest 2 View  Result Date: 12/13/2019 CLINICAL DATA:  Preoperative for right knee replacement EXAM: CHEST - 2 VIEW COMPARISON:  01/01/2017 chest radiograph. FINDINGS: Stable cardiomediastinal silhouette with normal heart size. No pneumothorax. No pleural effusion. Lungs appear clear, with no acute consolidative airspace disease and no pulmonary edema. IMPRESSION: No active cardiopulmonary disease. Electronically Signed   By: Ilona Sorrel M.D.   On: 12/13/2019 16:50   VAS [place] LOWER EXTREMITY VENOUS (DVT)  Result Date: 11/25/2019  Lower Venous DVTStudy Indications: Pain, and Swelling.  Comparison Study: No prior study on file for comparison Performing Technologist: Sharion Dove RVS  Examination Guidelines: A complete evaluation includes B-mode imaging, spectral Doppler, color Doppler, and power Doppler as needed of all accessible portions of each vessel. Bilateral testing is considered an integral part of a complete examination. Limited examinations for reoccurring indications  may be performed as noted. The reflux portion of the exam is performed with the patient in reverse Trendelenburg.  +---------+---------------+---------+-----------+----------+--------------+ RIGHT    CompressibilityPhasicitySpontaneityPropertiesThrombus Aging +---------+---------------+---------+-----------+----------+--------------+ CFV      Full           Yes      Yes                                 +---------+---------------+---------+-----------+----------+--------------+ SFJ      Full                                                        +---------+---------------+---------+-----------+----------+--------------+  FV Prox  Full                                                        +---------+---------------+---------+-----------+----------+--------------+ FV Mid   Full                                                        +---------+---------------+---------+-----------+----------+--------------+ FV DistalFull                                                        +---------+---------------+---------+-----------+----------+--------------+ PFV      Full                                                        +---------+---------------+---------+-----------+----------+--------------+ POP      Full           Yes      Yes                                 +---------+---------------+---------+-----------+----------+--------------+ PTV      Full                                                        +---------+---------------+---------+-----------+----------+--------------+ PERO     Full                                                        +---------+---------------+---------+-----------+----------+--------------+   +---------+---------------+---------+-----------+----------+--------------+ LEFT     CompressibilityPhasicitySpontaneityPropertiesThrombus Aging +---------+---------------+---------+-----------+----------+--------------+ CFV       Full           Yes      Yes                                 +---------+---------------+---------+-----------+----------+--------------+ SFJ      Full                                                        +---------+---------------+---------+-----------+----------+--------------+ FV Prox  Full                                                        +---------+---------------+---------+-----------+----------+--------------+  FV Mid   Full                                                        +---------+---------------+---------+-----------+----------+--------------+ FV DistalFull                                                        +---------+---------------+---------+-----------+----------+--------------+ PFV      Full                                                        +---------+---------------+---------+-----------+----------+--------------+ POP      Full           Yes      Yes                                 +---------+---------------+---------+-----------+----------+--------------+ PTV      Full                                                        +---------+---------------+---------+-----------+----------+--------------+ PERO     Full                                                        +---------+---------------+---------+-----------+----------+--------------+     Summary: BILATERAL: - No evidence of deep vein thrombosis seen in the lower extremities, bilaterally.  RIGHT: - An intact cystic structure is found in the popliteal fossa.  LEFT: - No cystic structure found in the popliteal fossa.  *See table(s) above for measurements and observations. Electronically signed by Servando Snare MD on 11/25/2019 at 1:41:01 PM.    Final     Disposition: Discharge disposition: 01-Home or Self Care       Discharge Instructions    Call MD / Call 911   Complete by: As directed    If you experience chest pain or shortness of breath, CALL 911 and  be transported to the hospital emergency room.  If you develope a fever above 101 F, pus (white drainage) or increased drainage or redness at the wound, or calf pain, call your surgeon's office.   Change dressing   Complete by: As directed    Change dressing Only if drainage exceeds 40% of window on dressing   Constipation Prevention   Complete by: As directed    Drink plenty of fluids.  Prune juice may be helpful.  You may use a stool softener, such as Colace (over the counter) 100 mg twice a day.  Use MiraLax (over the counter) for constipation as needed.   Diet - low sodium heart healthy   Complete by:  As directed    Increase activity slowly as tolerated   Complete by: As directed       Follow-up Information    Frederik Pear, MD In 2 weeks.   Specialty: Orthopedic Surgery Contact information: [place] [address] [phone]        [facility] Follow up.   Specialty: [place] Why: The agency will contact you prior to the first visit.  Contact information: [address] [place] [address] [phone]            Signed: Joanell Rising 12/19/2019, 8:52 AM

## 2019-12-22 DIAGNOSIS — M1712 Unilateral primary osteoarthritis, left knee: Secondary | ICD-10-CM | POA: Diagnosis not present

## 2019-12-22 DIAGNOSIS — Z471 Aftercare following joint replacement surgery: Secondary | ICD-10-CM | POA: Diagnosis not present

## 2019-12-22 DIAGNOSIS — K219 Gastro-esophageal reflux disease without esophagitis: Secondary | ICD-10-CM | POA: Diagnosis not present

## 2019-12-22 DIAGNOSIS — F329 Major depressive disorder, single episode, unspecified: Secondary | ICD-10-CM | POA: Diagnosis not present

## 2019-12-22 DIAGNOSIS — Z87891 Personal history of nicotine dependence: Secondary | ICD-10-CM | POA: Diagnosis not present

## 2019-12-22 DIAGNOSIS — E890 Postprocedural hypothyroidism: Secondary | ICD-10-CM | POA: Diagnosis not present

## 2019-12-22 DIAGNOSIS — E785 Hyperlipidemia, unspecified: Secondary | ICD-10-CM | POA: Diagnosis not present

## 2019-12-22 DIAGNOSIS — F419 Anxiety disorder, unspecified: Secondary | ICD-10-CM | POA: Diagnosis not present

## 2019-12-22 DIAGNOSIS — N393 Stress incontinence (female) (male): Secondary | ICD-10-CM | POA: Diagnosis not present

## 2019-12-22 DIAGNOSIS — Z9181 History of falling: Secondary | ICD-10-CM | POA: Diagnosis not present

## 2019-12-22 DIAGNOSIS — I10 Essential (primary) hypertension: Secondary | ICD-10-CM | POA: Diagnosis not present

## 2019-12-22 DIAGNOSIS — F909 Attention-deficit hyperactivity disorder, unspecified type: Secondary | ICD-10-CM | POA: Diagnosis not present

## 2019-12-22 DIAGNOSIS — Z96651 Presence of right artificial knee joint: Secondary | ICD-10-CM | POA: Diagnosis not present

## 2019-12-22 DIAGNOSIS — Z6839 Body mass index (BMI) 39.0-39.9, adult: Secondary | ICD-10-CM | POA: Diagnosis not present

## 2019-12-27 ENCOUNTER — Other Ambulatory Visit (HOSPITAL_COMMUNITY): Payer: Self-pay | Admitting: Orthopedic Surgery

## 2019-12-27 ENCOUNTER — Other Ambulatory Visit: Payer: Self-pay

## 2019-12-27 ENCOUNTER — Ambulatory Visit (HOSPITAL_COMMUNITY)
Admission: RE | Admit: 2019-12-27 | Discharge: 2019-12-27 | Disposition: A | Discharge status: Home / Self Care | Payer: PPO | Source: Ambulatory Visit | Attending: Orthopedic Surgery | Admitting: Orthopedic Surgery

## 2019-12-27 DIAGNOSIS — Z9889 Other specified postprocedural states: Secondary | ICD-10-CM | POA: Diagnosis not present

## 2019-12-27 DIAGNOSIS — M1712 Unilateral primary osteoarthritis, left knee: Secondary | ICD-10-CM | POA: Diagnosis not present

## 2019-12-27 DIAGNOSIS — M7989 Other specified soft tissue disorders: Secondary | ICD-10-CM | POA: Diagnosis not present

## 2019-12-27 DIAGNOSIS — M79604 Pain in right leg: Secondary | ICD-10-CM | POA: Diagnosis not present

## 2019-12-27 DIAGNOSIS — M25561 Pain in right knee: Secondary | ICD-10-CM | POA: Diagnosis not present

## 2019-12-27 DIAGNOSIS — Z96651 Presence of right artificial knee joint: Secondary | ICD-10-CM | POA: Diagnosis not present

## 2020-01-03 DIAGNOSIS — Z96651 Presence of right artificial knee joint: Secondary | ICD-10-CM | POA: Diagnosis not present

## 2020-01-03 DIAGNOSIS — Z9889 Other specified postprocedural states: Secondary | ICD-10-CM | POA: Diagnosis not present

## 2020-01-03 DIAGNOSIS — M1712 Unilateral primary osteoarthritis, left knee: Secondary | ICD-10-CM | POA: Diagnosis not present

## 2020-01-09 DIAGNOSIS — M1712 Unilateral primary osteoarthritis, left knee: Secondary | ICD-10-CM | POA: Diagnosis not present

## 2020-01-09 DIAGNOSIS — Z96651 Presence of right artificial knee joint: Secondary | ICD-10-CM | POA: Diagnosis not present

## 2020-01-09 DIAGNOSIS — M6281 Muscle weakness (generalized): Secondary | ICD-10-CM | POA: Diagnosis not present

## 2020-01-09 DIAGNOSIS — R269 Unspecified abnormalities of gait and mobility: Secondary | ICD-10-CM | POA: Diagnosis not present

## 2020-01-10 DIAGNOSIS — E039 Hypothyroidism, unspecified: Secondary | ICD-10-CM | POA: Diagnosis not present

## 2020-01-10 DIAGNOSIS — I10 Essential (primary) hypertension: Secondary | ICD-10-CM | POA: Diagnosis not present

## 2020-01-10 DIAGNOSIS — E7800 Pure hypercholesterolemia, unspecified: Secondary | ICD-10-CM | POA: Diagnosis not present

## 2020-01-10 DIAGNOSIS — F331 Major depressive disorder, recurrent, moderate: Secondary | ICD-10-CM | POA: Diagnosis not present

## 2020-01-10 DIAGNOSIS — F324 Major depressive disorder, single episode, in partial remission: Secondary | ICD-10-CM | POA: Diagnosis not present

## 2020-01-10 DIAGNOSIS — G4700 Insomnia, unspecified: Secondary | ICD-10-CM | POA: Diagnosis not present

## 2020-01-10 DIAGNOSIS — M8580 Other specified disorders of bone density and structure, unspecified site: Secondary | ICD-10-CM | POA: Diagnosis not present

## 2020-01-11 DIAGNOSIS — M6281 Muscle weakness (generalized): Secondary | ICD-10-CM | POA: Diagnosis not present

## 2020-01-11 DIAGNOSIS — M1712 Unilateral primary osteoarthritis, left knee: Secondary | ICD-10-CM | POA: Diagnosis not present

## 2020-01-11 DIAGNOSIS — R269 Unspecified abnormalities of gait and mobility: Secondary | ICD-10-CM | POA: Diagnosis not present

## 2020-01-11 DIAGNOSIS — Z96651 Presence of right artificial knee joint: Secondary | ICD-10-CM | POA: Diagnosis not present

## 2020-01-14 DIAGNOSIS — F4310 Post-traumatic stress disorder, unspecified: Secondary | ICD-10-CM | POA: Diagnosis not present

## 2020-01-15 ENCOUNTER — Inpatient Hospital Stay (HOSPITAL_COMMUNITY): Payer: PPO

## 2020-01-15 ENCOUNTER — Other Ambulatory Visit: Payer: Self-pay

## 2020-01-15 ENCOUNTER — Inpatient Hospital Stay (HOSPITAL_COMMUNITY)
Admission: EM | Admit: 2020-01-15 | Discharge: 2020-01-21 | DRG: 354 | Disposition: A | Discharge status: Home Health | Payer: PPO | Attending: General Surgery | Admitting: General Surgery

## 2020-01-15 ENCOUNTER — Emergency Department (HOSPITAL_COMMUNITY): Payer: PPO

## 2020-01-15 ENCOUNTER — Encounter (HOSPITAL_COMMUNITY): Payer: Self-pay

## 2020-01-15 DIAGNOSIS — I10 Essential (primary) hypertension: Secondary | ICD-10-CM | POA: Diagnosis not present

## 2020-01-15 DIAGNOSIS — R1084 Generalized abdominal pain: Secondary | ICD-10-CM | POA: Diagnosis not present

## 2020-01-15 DIAGNOSIS — F909 Attention-deficit hyperactivity disorder, unspecified type: Secondary | ICD-10-CM | POA: Diagnosis not present

## 2020-01-15 DIAGNOSIS — K56609 Unspecified intestinal obstruction, unspecified as to partial versus complete obstruction: Secondary | ICD-10-CM

## 2020-01-15 DIAGNOSIS — Z20822 Contact with and (suspected) exposure to covid-19: Secondary | ICD-10-CM | POA: Diagnosis present

## 2020-01-15 DIAGNOSIS — D62 Acute posthemorrhagic anemia: Secondary | ICD-10-CM | POA: Diagnosis not present

## 2020-01-15 DIAGNOSIS — Z882 Allergy status to sulfonamides status: Secondary | ICD-10-CM

## 2020-01-15 DIAGNOSIS — Z6841 Body Mass Index (BMI) 40.0 and over, adult: Secondary | ICD-10-CM

## 2020-01-15 DIAGNOSIS — Z96651 Presence of right artificial knee joint: Secondary | ICD-10-CM | POA: Diagnosis present

## 2020-01-15 DIAGNOSIS — Z888 Allergy status to other drugs, medicaments and biological substances status: Secondary | ICD-10-CM | POA: Diagnosis not present

## 2020-01-15 DIAGNOSIS — K436 Other and unspecified ventral hernia with obstruction, without gangrene: Secondary | ICD-10-CM | POA: Diagnosis not present

## 2020-01-15 DIAGNOSIS — N393 Stress incontinence (female) (male): Secondary | ICD-10-CM | POA: Diagnosis not present

## 2020-01-15 DIAGNOSIS — M1990 Unspecified osteoarthritis, unspecified site: Secondary | ICD-10-CM | POA: Diagnosis not present

## 2020-01-15 DIAGNOSIS — E876 Hypokalemia: Secondary | ICD-10-CM | POA: Diagnosis not present

## 2020-01-15 DIAGNOSIS — R188 Other ascites: Secondary | ICD-10-CM | POA: Diagnosis present

## 2020-01-15 DIAGNOSIS — Z8249 Family history of ischemic heart disease and other diseases of the circulatory system: Secondary | ICD-10-CM | POA: Diagnosis not present

## 2020-01-15 DIAGNOSIS — E785 Hyperlipidemia, unspecified: Secondary | ICD-10-CM | POA: Diagnosis not present

## 2020-01-15 DIAGNOSIS — R1110 Vomiting, unspecified: Secondary | ICD-10-CM | POA: Diagnosis not present

## 2020-01-15 DIAGNOSIS — Z881 Allergy status to other antibiotic agents status: Secondary | ICD-10-CM | POA: Diagnosis not present

## 2020-01-15 DIAGNOSIS — Z4682 Encounter for fitting and adjustment of non-vascular catheter: Secondary | ICD-10-CM | POA: Diagnosis not present

## 2020-01-15 DIAGNOSIS — Z87891 Personal history of nicotine dependence: Secondary | ICD-10-CM | POA: Diagnosis not present

## 2020-01-15 DIAGNOSIS — F410 Panic disorder [episodic paroxysmal anxiety] without agoraphobia: Secondary | ICD-10-CM | POA: Diagnosis not present

## 2020-01-15 DIAGNOSIS — Z7982 Long term (current) use of aspirin: Secondary | ICD-10-CM | POA: Diagnosis not present

## 2020-01-15 DIAGNOSIS — Z0189 Encounter for other specified special examinations: Secondary | ICD-10-CM

## 2020-01-15 DIAGNOSIS — Z79899 Other long term (current) drug therapy: Secondary | ICD-10-CM

## 2020-01-15 DIAGNOSIS — Z79890 Hormone replacement therapy (postmenopausal): Secondary | ICD-10-CM

## 2020-01-15 DIAGNOSIS — E039 Hypothyroidism, unspecified: Secondary | ICD-10-CM | POA: Diagnosis not present

## 2020-01-15 DIAGNOSIS — E890 Postprocedural hypothyroidism: Secondary | ICD-10-CM | POA: Diagnosis present

## 2020-01-15 DIAGNOSIS — Z791 Long term (current) use of non-steroidal anti-inflammatories (NSAID): Secondary | ICD-10-CM

## 2020-01-15 DIAGNOSIS — R109 Unspecified abdominal pain: Secondary | ICD-10-CM | POA: Diagnosis not present

## 2020-01-15 DIAGNOSIS — F329 Major depressive disorder, single episode, unspecified: Secondary | ICD-10-CM | POA: Diagnosis not present

## 2020-01-15 DIAGNOSIS — K5669 Other partial intestinal obstruction: Secondary | ICD-10-CM | POA: Diagnosis not present

## 2020-01-15 DIAGNOSIS — Z03818 Encounter for observation for suspected exposure to other biological agents ruled out: Secondary | ICD-10-CM | POA: Diagnosis not present

## 2020-01-15 DIAGNOSIS — R1114 Bilious vomiting: Secondary | ICD-10-CM | POA: Diagnosis not present

## 2020-01-15 DIAGNOSIS — K5900 Constipation, unspecified: Secondary | ICD-10-CM | POA: Diagnosis not present

## 2020-01-15 DIAGNOSIS — K219 Gastro-esophageal reflux disease without esophagitis: Secondary | ICD-10-CM | POA: Diagnosis present

## 2020-01-15 HISTORY — DX: Unspecified intestinal obstruction, unspecified as to partial versus complete obstruction: K56.609

## 2020-01-15 LAB — URINALYSIS, ROUTINE W REFLEX MICROSCOPIC
Bilirubin Urine: NEGATIVE
Glucose, UA: NEGATIVE mg/dL
Hgb urine dipstick: NEGATIVE
Ketones, ur: 5 mg/dL — AB
Leukocytes,Ua: NEGATIVE
Nitrite: POSITIVE — AB
Protein, ur: NEGATIVE mg/dL
Specific Gravity, Urine: 1.015 (ref 1.005–1.030)
pH: 7 (ref 5.0–8.0)

## 2020-01-15 LAB — HIV ANTIBODY (ROUTINE TESTING W REFLEX): HIV Screen 4th Generation wRfx: NONREACTIVE

## 2020-01-15 LAB — CBC
HCT: 35.5 % — ABNORMAL LOW (ref 36.0–46.0)
Hemoglobin: 11 g/dL — ABNORMAL LOW (ref 12.0–15.0)
MCH: 27.3 pg (ref 26.0–34.0)
MCHC: 31 g/dL (ref 30.0–36.0)
MCV: 88.1 fL (ref 80.0–100.0)
Platelets: 472 10*3/uL — ABNORMAL HIGH (ref 150–400)
RBC: 4.03 MIL/uL (ref 3.87–5.11)
RDW: 14.6 % (ref 11.5–15.5)
WBC: 8 10*3/uL (ref 4.0–10.5)
nRBC: 0 % (ref 0.0–0.2)

## 2020-01-15 LAB — SARS CORONAVIRUS 2 BY RT PCR (HOSPITAL ORDER, PERFORMED IN ~~LOC~~ HOSPITAL LAB): SARS Coronavirus 2: NEGATIVE

## 2020-01-15 LAB — COMPREHENSIVE METABOLIC PANEL
ALT: 18 U/L (ref 0–44)
AST: 19 U/L (ref 15–41)
Albumin: 3.5 g/dL (ref 3.5–5.0)
Alkaline Phosphatase: 111 U/L (ref 38–126)
Anion gap: 15 (ref 5–15)
BUN: 7 mg/dL — ABNORMAL LOW (ref 8–23)
CO2: 24 mmol/L (ref 22–32)
Calcium: 9.9 mg/dL (ref 8.9–10.3)
Chloride: 92 mmol/L — ABNORMAL LOW (ref 98–111)
Creatinine, Ser: 0.6 mg/dL (ref 0.44–1.00)
GFR calc Af Amer: >60 mL/min (ref >60)
GFR calc non Af Amer: >60 mL/min (ref >60)
Glucose, Bld: 107 mg/dL — ABNORMAL HIGH (ref 70–99)
Potassium: 4.2 mmol/L (ref 3.5–5.1)
Sodium: 131 mmol/L — ABNORMAL LOW (ref 135–145)
Total Bilirubin: 1.1 mg/dL (ref 0.3–1.2)
Total Protein: 6.8 g/dL (ref 6.5–8.1)

## 2020-01-15 LAB — LIPASE, BLOOD: Lipase: 17 U/L (ref 11–51)

## 2020-01-15 MED ORDER — SODIUM CHLORIDE 0.9 % IV BOLUS
1000.0000 mL | Freq: Once | INTRAVENOUS | Status: AC
  Administered 2020-01-15: 1000 mL via INTRAVENOUS

## 2020-01-15 MED ORDER — HYDROMORPHONE HCL 1 MG/ML IJ SOLN
0.5000 mg | Freq: Once | INTRAMUSCULAR | Status: AC
  Administered 2020-01-15: 0.5 mg via INTRAVENOUS
  Filled 2020-01-15: qty 1

## 2020-01-15 MED ORDER — DIPHENHYDRAMINE HCL 12.5 MG/5ML PO ELIX: 12.5000 mg | ORAL_SOLUTION | Freq: Four times a day (QID) | ORAL | Status: DC | PRN

## 2020-01-15 MED ORDER — IOHEXOL 300 MG/ML  SOLN
100.0000 mL | Freq: Once | INTRAMUSCULAR | Status: AC | PRN
  Administered 2020-01-15: 100 mL via INTRAVENOUS

## 2020-01-15 MED ORDER — VANCOMYCIN HCL 1500 MG/300ML IV SOLN
1500.0000 mg | INTRAVENOUS | Status: AC
  Administered 2020-01-16: 1500 mg via INTRAVENOUS
  Filled 2020-01-15: qty 300

## 2020-01-15 MED ORDER — DIPHENHYDRAMINE HCL 50 MG/ML IJ SOLN: 12.5000 mg | Freq: Four times a day (QID) | INTRAMUSCULAR | Status: DC | PRN

## 2020-01-15 MED ORDER — ACETAMINOPHEN 325 MG PO TABS: 650.0000 mg | ORAL_TABLET | Freq: Four times a day (QID) | ORAL | Status: DC | PRN

## 2020-01-15 MED ORDER — HYDROMORPHONE HCL 1 MG/ML IJ SOLN
0.5000 mg | INTRAMUSCULAR | Status: DC | PRN
  Administered 2020-01-16: 1 mg via INTRAVENOUS
  Filled 2020-01-15: qty 1

## 2020-01-15 MED ORDER — ACETAMINOPHEN 650 MG RE SUPP: 650.0000 mg | Freq: Four times a day (QID) | RECTAL | Status: DC | PRN

## 2020-01-15 MED ORDER — SODIUM CHLORIDE 0.9 % IV SOLN: INTRAVENOUS | Status: DC

## 2020-01-15 MED ORDER — LORAZEPAM 2 MG/ML IJ SOLN: 1.0000 mg | Freq: Four times a day (QID) | INTRAMUSCULAR | Status: DC | PRN

## 2020-01-15 MED ORDER — ONDANSETRON HCL 4 MG/2ML IJ SOLN
4.0000 mg | Freq: Once | INTRAMUSCULAR | Status: AC
  Administered 2020-01-15: 4 mg via INTRAVENOUS
  Filled 2020-01-15: qty 2

## 2020-01-15 MED ORDER — LEVOTHYROXINE SODIUM 100 MCG/5ML IV SOLN: 75.0000 ug | Freq: Every day | INTRAVENOUS | Status: DC

## 2020-01-15 MED ORDER — PANTOPRAZOLE SODIUM 40 MG IV SOLR
40.0000 mg | Freq: Every day | INTRAVENOUS | Status: DC
  Administered 2020-01-15 – 2020-01-16 (×2): 40 mg via INTRAVENOUS
  Filled 2020-01-15 (×2): qty 40

## 2020-01-15 MED ORDER — SODIUM CHLORIDE 0.9% FLUSH
3.0000 mL | Freq: Once | INTRAVENOUS | Status: AC
  Administered 2020-01-15: 3 mL via INTRAVENOUS

## 2020-01-15 MED ORDER — VANCOMYCIN HCL IN DEXTROSE 1-5 GM/200ML-% IV SOLN
1000.0000 mg | INTRAVENOUS | Status: DC
  Filled 2020-01-15: qty 200

## 2020-01-15 MED ORDER — ONDANSETRON 4 MG PO TBDP: 4.0000 mg | ORAL_TABLET | Freq: Four times a day (QID) | ORAL | Status: DC | PRN

## 2020-01-15 MED ORDER — METOPROLOL TARTRATE 5 MG/5ML IV SOLN: 5.0000 mg | Freq: Four times a day (QID) | INTRAVENOUS | Status: DC | PRN

## 2020-01-15 MED ORDER — METHOCARBAMOL 1000 MG/10ML IJ SOLN
500.0000 mg | Freq: Four times a day (QID) | INTRAVENOUS | Status: DC | PRN
  Filled 2020-01-15: qty 5

## 2020-01-15 MED ORDER — ACETAMINOPHEN 500 MG PO TABS: 1000.0000 mg | ORAL_TABLET | ORAL | Status: DC

## 2020-01-15 MED ORDER — ONDANSETRON HCL 4 MG/2ML IJ SOLN
4.0000 mg | Freq: Four times a day (QID) | INTRAMUSCULAR | Status: DC | PRN
  Administered 2020-01-18 (×3): 4 mg via INTRAVENOUS
  Filled 2020-01-15 (×3): qty 2

## 2020-01-15 MED ORDER — ENOXAPARIN SODIUM 40 MG/0.4ML ~~LOC~~ SOLN
40.0000 mg | SUBCUTANEOUS | Status: DC
  Administered 2020-01-15 – 2020-01-16 (×2): 40 mg via SUBCUTANEOUS
  Filled 2020-01-15 (×2): qty 0.4

## 2020-01-15 MED ORDER — ONDANSETRON HCL 4 MG/2ML IJ SOLN
4.0000 mg | Freq: Once | INTRAMUSCULAR | Status: AC | PRN
  Administered 2020-01-15: 4 mg via INTRAVENOUS
  Filled 2020-01-15: qty 2

## 2020-01-15 NOTE — ED Notes (Signed)
Patient transported to CT 

## 2020-01-15 NOTE — ED Provider Notes (Signed)
[facility] Emergency Department Provider Note MRN:  [id number]  Arrival date & time: 01/15/20     Chief Complaint   possible bowel obstruction   History of Present Illness   [name] [name] is a 69 y.o. year-old female with a history of hyperlipidemia presenting to the ED with chief complaint of possible bowel obstruction.  Generalized abdominal pain since yesterday, progressively worsening and now with nausea and persistent bilious vomiting, no blood in the vomit.  Had a bowel movement yesterday morning but none since, not passing gas today.  Denies chest pain or shortness of breath, no fever.  Explains that she had a recent knee replacement few months ago but has been off of her oxycodone for several weeks.  Review of Systems  A complete 10 system review of systems was obtained and all systems are negative except as noted in the HPI and PMH.   Patient's Health History    Past Medical History:  Diagnosis Date  . ADHD (attention deficit hyperactivity disorder)   . Anxiety   . Arthritis   . Depression   . GERD (gastroesophageal reflux disease)   . History of panic attacks   . Hyperlipidemia   . Hypertension   . Hypothyroidism, postsurgical   . SUI (stress urinary incontinence, female)   . Wears glasses     Past Surgical History:  Procedure Laterality Date  . D & C HYSTEROSCOPY W/ RESECTION ENDOMETRIAL POLYP  04/15/2009  . D & C HYSTEROSCOPY W/ RESECTOSCOPE AND THERMACHOICE ENDOMETRIAL ABLATION  12/09/2010  . PUBOVAGINAL SLING N/A 07/26/2016   Procedure: PUBO-VAGINAL SLING ALTIS SLING;  Surgeon: Carolan Clines, MD;  Location: [facility];  Service: Urology;  Laterality: N/A;  . RESECTION GIANT CELL TUMOR RIGHT LONG FINGER  09/13/2008  . THYROIDECTOMY  1979    [facility]   non-malignant diseased thyroid  . TOTAL KNEE ARTHROPLASTY Right 12/17/2019   Procedure: RIGHT TOTAL KNEE ARTHROPLASTY;  Surgeon: Frederik Pear, MD;  Location: [facility];   Service: Orthopedics;  Laterality: Right;  . TRIPLE ARTHRODESIS LEFT FOOT  04/20/2007  . VENTRAL HERNIA REPAIR  2003  . WRIST GANGLION EXCISION Right 1975    Family History  Problem Relation Age of Onset  . Breast cancer Mother   . Uterine cancer Mother   . Heart disease Paternal Grandfather   . Diabetes Maternal Grandfather     Social History   Socioeconomic History  . Marital status: Married    Spouse name: Not on file  . Number of children: Not on file  . Years of education: Not on file  . Highest education level: Not on file  Occupational History  . Not on file  Tobacco Use  . Smoking status: Former Smoker    Years: 6.00    Types: Cigarettes    Quit date: 09/21/1974    Years since quitting: 45.3  . Smokeless tobacco: Never Used  Substance and Sexual Activity  . Alcohol use: Yes    Comment: very rare  . Drug use: No  . Sexual activity: Yes    Birth control/protection: Post-menopausal  Other Topics Concern  . Not on file  Social History Narrative  . Not on file   Social Determinants of Health   Financial Resource Strain:   . Difficulty of Paying Living Expenses:   Food Insecurity:   . Worried About Charity fundraiser in the Last Year:   . Arboriculturist in the Last Year:   [employer]  Needs:   . Lack of Transportation (Medical):   Marland Kitchen Lack of Transportation (Non-Medical):   Physical Activity:   . Days of Exercise per Week:   . Minutes of Exercise per Session:   Stress:   . Feeling of Stress :   Social Connections:   . Frequency of Communication with Friends and Family:   . Frequency of Social Gatherings with Friends and Family:   . Attends Religious Services:   . Active Member of Clubs or Organizations:   . Attends Archivist Meetings:   Marland Kitchen Marital Status:   Intimate Partner Violence:   . Fear of Current or Ex-Partner:   . Emotionally Abused:   Marland Kitchen Physically Abused:   . Sexually Abused:      Physical Exam   Vitals:   01/15/20 1330  01/15/20 1345  BP:    Pulse: 85 89  Resp:    Temp:    SpO2: 95% 97%    CONSTITUTIONAL: Well-appearing, NAD NEURO:  Alert and oriented x 3, no focal deficits EYES:  eyes equal and reactive ENT/NECK:  no LAD, no JVD CARDIO: Regular rate, well-perfused, normal S1 and S2 PULM:  CTAB no wheezing or rhonchi GI/GU:  normal bowel sounds, non-distended, non-tender MSK/SPINE:  No gross deformities, 2+ pitting edema to the bilateral lower extremities SKIN:  no rash, atraumatic PSYCH:  Appropriate speech and behavior  *Additional and/or pertinent findings included in MDM below  Diagnostic and Interventional Summary    EKG Interpretation  Date/Time:    Ventricular Rate:    PR Interval:    QRS Duration:   QT Interval:    QTC Calculation:   R Axis:     Text Interpretation:        Labs Reviewed  COMPREHENSIVE METABOLIC PANEL - Abnormal; Notable for the following components:      Result Value   Sodium 131 (*)    Chloride 92 (*)    Glucose, Bld 107 (*)    BUN 7 (*)    All other components within normal limits  CBC - Abnormal; Notable for the following components:   Hemoglobin 11.0 (*)    HCT 35.5 (*)    Platelets 472 (*)    All other components within normal limits  URINALYSIS, ROUTINE W REFLEX MICROSCOPIC - Abnormal; Notable for the following components:   Ketones, ur 5 (*)    Nitrite POSITIVE (*)    Bacteria, UA MANY (*)    All other components within normal limits  SARS CORONAVIRUS 2 BY RT PCR (HOSPITAL ORDER, [facility])  URINE CULTURE  LIPASE, BLOOD  HIV ANTIBODY (ROUTINE TESTING W REFLEX)    CT ABDOMEN PELVIS W CONTRAST  Final Result    DG Abd Portable 1V    (Results Pending)    Medications  HYDROmorphone (DILAUDID) injection 0.5 mg (has no administration in time range)  ondansetron (ZOFRAN) injection 4 mg (has no administration in time range)  enoxaparin (LOVENOX) injection 40 mg (has no administration in time range)  0.9 %  sodium  chloride infusion (has no administration in time range)  metoprolol tartrate (LOPRESSOR) injection 5 mg (has no administration in time range)  pantoprazole (PROTONIX) injection 40 mg (has no administration in time range)  ondansetron (ZOFRAN-ODT) disintegrating tablet 4 mg (has no administration in time range)    Or  ondansetron (ZOFRAN) injection 4 mg (has no administration in time range)  diphenhydrAMINE (BENADRYL) 12.5 MG/5ML elixir 12.5 mg (has no administration in time  range)    Or  diphenhydrAMINE (BENADRYL) injection 12.5 mg (has no administration in time range)  acetaminophen (TYLENOL) tablet 650 mg (has no administration in time range)    Or  acetaminophen (TYLENOL) suppository 650 mg (has no administration in time range)  HYDROmorphone (DILAUDID) injection 0.5-1 mg (has no administration in time range)  methocarbamol (ROBAXIN) 500 mg in dextrose 5 % 50 mL IVPB (has no administration in time range)  LORazepam (ATIVAN) injection 1 mg (has no administration in time range)  levothyroxine (SYNTHROID, LEVOTHROID) injection 75 mcg (has no administration in time range)  acetaminophen (TYLENOL) tablet 1,000 mg (has no administration in time range)  vancomycin (VANCOCIN) IVPB 1000 mg/200 mL premix (has no administration in time range)  sodium chloride flush (NS) 0.9 % injection 3 mL (3 mLs Intravenous Given 01/15/20 1219)  sodium chloride 0.9 % bolus 1,000 mL (0 mLs Intravenous Stopped 01/15/20 1356)  ondansetron (ZOFRAN) injection 4 mg (4 mg Intravenous Given 01/15/20 1158)  HYDROmorphone (DILAUDID) injection 0.5 mg (0.5 mg Intravenous Given 01/15/20 1217)  iohexol (OMNIPAQUE) 300 MG/ML solution 100 mL (100 mLs Intravenous Contrast Given 01/15/20 1311)     Procedures  /  Critical Care .Critical Care Performed by: Maudie Flakes, MD Authorized by: Maudie Flakes, MD   Critical care provider statement:    Critical care time (minutes):  32   Critical care was time spent personally by me on the  following activities:  Discussions with consultants, evaluation of patient's response to treatment, examination of patient, ordering and performing treatments and interventions, ordering and review of laboratory studies, ordering and review of radiographic studies, pulse oximetry, re-evaluation of patient's condition, obtaining history from patient or surrogate and review of old charts    ED Course and Medical Decision Making  I have reviewed the triage vital signs, the nursing notes, and pertinent available records from the EMR.  Listed above are laboratory and imaging tests that I personally ordered, reviewed, and interpreted and then considered in my medical decision making (see below for details).      Concern for possible small bowel obstruction, abdominal exam is mildly distended but nontender, no rebound or guarding or rigidity.  Vital signs are reassuring.  CT is pending.  CT confirms bowel obstruction due to hernia, to be admitted to general surgery.  Barth Kirks. Sedonia Small, MD [place] [email or web address]  Final Clinical Impressions(s) / ED Diagnoses     ICD-10-CM   1. Spigelian hernia with bowel obstruction  K43.6   2. Encounter for imaging study to confirm nasogastric (NG) tube placement  Z01.89 DG Abd Portable 1V    DG Abd Portable 1V    ED Discharge Orders    None       Discharge Instructions Discussed with and Provided to Patient:   Discharge Instructions   None       Maudie Flakes, MD 01/15/20 1630

## 2020-01-15 NOTE — ED Triage Notes (Signed)
Patient complains of abdominal bloating with constipation and nausea/vomiting. Sent by MD to be evaluated for bowel obstruction. Patient complains of fatigue with same.

## 2020-01-15 NOTE — H&P (Signed)
[facility] Admission Note  [name] [name] 14-Aug-1951  [id number].    Requesting MD: Gerlene Fee Chief Complaint/Reason for Consult: SBO  HPI:  [name] [name] is a 69yo female PMH HTN, HLD, anxiety, hypothyroidism, and recent right TKA 12/17/19 by Dr. Mayer Camel who presented to [facility] earlier today complaining of abdominal pain. States that the pain started around 1300 yesterday. She has never had pain like this before. It is located in her left abdomen. Intermittent and crampy but gradually getting worse. Associated with multiple episodes of nausea and vomiting. Last BM yesterday. No flatus today. Denies fever, chills, CP, SOB, dysuria.  ED workup included CT scan which shows small-bowel obstruction secondary to a small bowel loop within a LEFT spigelian hernia. WBC 8. General surgery asked to see.  Abdominal surgical history: ventral hernia repair with mesh 2003 Nonsmoker Currently using crutches since TKA  Review of Systems  Constitutional: Negative.   HENT: Negative.   Eyes: Negative.   Respiratory: Negative.   Cardiovascular: Negative.   Gastrointestinal: Positive for abdominal pain, nausea and vomiting.  Genitourinary: Negative for dysuria.  Musculoskeletal: Positive for joint pain.  Skin: Negative.     All systems reviewed and otherwise negative except for as above  Family History  Problem Relation Age of Onset  . Breast cancer Mother   . Uterine cancer Mother   . Heart disease Paternal Grandfather   . Diabetes Maternal Grandfather     Past Medical History:  Diagnosis Date  . ADHD (attention deficit hyperactivity disorder)   . Anxiety   . Arthritis   . Depression   . GERD (gastroesophageal reflux disease)   . History of panic attacks   . Hyperlipidemia   . Hypertension   . Hypothyroidism, postsurgical   . SUI (stress urinary incontinence, female)   . Wears glasses     Past Surgical History:  Procedure Laterality Date  . D & C HYSTEROSCOPY W/  RESECTION ENDOMETRIAL POLYP  04/15/2009  . D & C HYSTEROSCOPY W/ RESECTOSCOPE AND THERMACHOICE ENDOMETRIAL ABLATION  12/09/2010  . PUBOVAGINAL SLING N/A 07/26/2016   Procedure: PUBO-VAGINAL SLING ALTIS SLING;  Surgeon: Carolan Clines, MD;  Location: [facility];  Service: Urology;  Laterality: N/A;  . RESECTION GIANT CELL TUMOR RIGHT LONG FINGER  09/13/2008  . THYROIDECTOMY  1979    [facility]   non-malignant diseased thyroid  . TOTAL KNEE ARTHROPLASTY Right 12/17/2019   Procedure: RIGHT TOTAL KNEE ARTHROPLASTY;  Surgeon: Frederik Pear, MD;  Location: [facility];  Service: Orthopedics;  Laterality: Right;  . TRIPLE ARTHRODESIS LEFT FOOT  04/20/2007  . VENTRAL HERNIA REPAIR  2003  . WRIST GANGLION EXCISION Right 1975    Social History:  reports that she quit smoking about 45 years ago. Her smoking use included cigarettes. She quit after 6.00 years of use. She has never used smokeless tobacco. She reports current alcohol use. She reports that she does not use drugs.  Allergies:  Allergies  Allergen Reactions  . Ceclor [Cefaclor] Hives and Shortness Of Breath  . Bactrim [Sulfamethoxazole-Trimethoprim] Hives  . Gabapentin Swelling    (Not in a hospital admission)   Prior to Admission medications   Medication Sig Start Date End Date Taking? Authorizing Provider  acetaminophen (TYLENOL) 500 MG tablet Take 1,000 mg by mouth every 4 (four) hours as needed for mild pain.   Yes [provider]  ALPRAZolam Duanne Moron) 0.5 MG tablet Take 0.5 mg by mouth daily as needed for anxiety or sleep.  Yes [provider]  aspirin EC 81 MG tablet Take 1 tablet (81 mg total) by mouth 2 (two) times daily. 12/17/19  Yes Joanell Rising K, PA-C  buPROPion (WELLBUTRIN XL) 300 MG 24 hr tablet Take 300 mg by mouth daily.  05/26/16  Yes [provider]  celecoxib (CELEBREX) 200 MG capsule Take 1 capsule (200 mg total) by mouth 2 (two) times daily. 12/19/19 12/18/20 Yes Leighton Parody,  PA-C  DULoxetine (CYMBALTA) 60 MG capsule Take 120 mg by mouth daily.  05/26/16  Yes [provider]  irbesartan (AVAPRO) 150 MG tablet Take 150 mg by mouth daily. 06/15/19  Yes [provider]  levothyroxine (SYNTHROID, LEVOTHROID) 125 MCG tablet Take 125 mcg by mouth daily before breakfast.  05/02/16  Yes [provider]  omeprazole (PRILOSEC) 20 MG capsule Take 20 mg by mouth daily before breakfast.    Yes [provider]  OVER THE COUNTER MEDICATION Apply 1 application topically daily as needed (pain). Magnesium topical cream   Yes [provider]  simvastatin (ZOCOR) 40 MG tablet Take 40 mg by mouth daily.  05/26/16  Yes [provider]  tiZANidine (ZANAFLEX) 2 MG tablet Take 1 tablet (2 mg total) by mouth every 6 (six) hours as needed. Patient taking differently: Take 2 mg by mouth every 6 (six) hours as needed for muscle spasms.  12/17/19  Yes Leighton Parody, PA-C  torsemide (DEMADEX) 10 MG tablet Take 10 mg by mouth daily as needed (swelling).  11/26/19  Yes [provider]  lisdexamfetamine (VYVANSE) 50 MG capsule Take 50 mg by mouth daily.     [provider]  oxyCODONE-acetaminophen (PERCOCET/ROXICET) 5-325 MG tablet Take 1 tablet by mouth every 4 (four) hours as needed for severe pain. Patient not taking: Reported on 01/15/2020 12/17/19   Leighton Parody, PA-C    Blood pressure 137/61, pulse 89, temperature 98 F (36.7 C), temperature source Oral, resp. rate 18, height 5\' 3"  (1.6 m), weight 104.3 kg, SpO2 97 %. Physical Exam: General: pleasant, WD/WN white female who is laying in bed in NAD HEENT: head is normocephalic, atraumatic.  Sclera are noninjected.  PERRL.  Ears and nose without any masses or lesions.  Mouth is pink and moist. Dentition fair Heart: regular, rate, and rhythm.  Normal s1,s2. No obvious murmurs, gallops, or rubs noted.  Palpable pedal pulses bilaterally  Lungs: CTAB, no wheezes, rhonchi, or rales  noted.  Respiratory effort nonlabored Abd: obese, soft, ND, +BS, no masses or organomegaly. TTP left lower abdomen, unable to reduce hernia, no overlying skin changes. No rebound, guarding, or peritonitis MS: calves soft and nontender, mild right lower leg edema. Well healed incision anterior right knee  Skin: warm and dry with no masses, lesions, or rashes Psych: A&Ox4 with an appropriate affect Neuro: cranial nerves grossly intact, equal strength in BUE/BLE bilaterally, normal speech, thought process intact  Results for orders placed or performed during the hospital encounter of 01/15/20 (from the past 48 hour(s))  Lipase, blood     Status: None   Collection Time: 01/15/20 11:11 AM  Result Value Ref Range   Lipase 17 11 - 51 U/L    Comment: Performed at [facility], [place] [address], [place], [place] [address]  Comprehensive metabolic panel     Status: Abnormal   Collection Time: 01/15/20 11:11 AM  Result Value Ref Range   Sodium 131 (L) 135 - 145 mmol/L   Potassium 4.2 3.5 - 5.1 mmol/L  Chloride 92 (L) 98 - 111 mmol/L   CO2 24 22 - 32 mmol/L   Glucose, Bld 107 (H) 70 - 99 mg/dL    Comment: Glucose reference range applies only to samples taken after fasting for at least 8 hours.   BUN 7 (L) 8 - 23 mg/dL   Creatinine, Ser 0.60 0.44 - 1.00 mg/dL   Calcium 9.9 8.9 - 10.3 mg/dL   Total Protein 6.8 6.5 - 8.1 g/dL   Albumin 3.5 3.5 - 5.0 g/dL   AST 19 15 - 41 U/L   ALT 18 0 - 44 U/L   Alkaline Phosphatase 111 38 - 126 U/L   Total Bilirubin 1.1 0.3 - 1.2 mg/dL   GFR calc non Af Amer >60 >60 mL/min   GFR calc Af Amer >60 >60 mL/min   Anion gap 15 5 - 15    Comment: Performed at [facility], [place] [address], [place], [place] [address]  CBC     Status: Abnormal   Collection Time: 01/15/20 11:11 AM  Result Value Ref Range   WBC 8.0 4.0 - 10.5 K/uL   RBC 4.03 3.87 - 5.11 MIL/uL   Hemoglobin 11.0 (L) 12.0 - 15.0 g/dL   HCT 35.5 (L) 36.0 - 46.0 %   MCV 88.1 80.0 - 100.0 fL    MCH 27.3 26.0 - 34.0 pg   MCHC 31.0 30.0 - 36.0 g/dL   RDW 14.6 11.5 - 15.5 %   Platelets 472 (H) 150 - 400 K/uL   nRBC 0.0 0.0 - 0.2 %    Comment: Performed at [facility], [place] [address] [place], [place] [address]  Urinalysis, Routine w reflex microscopic     Status: Abnormal   Collection Time: 01/15/20  1:54 PM  Result Value Ref Range   Color, Urine YELLOW YELLOW   APPearance CLEAR CLEAR   Specific Gravity, Urine 1.015 1.005 - 1.030   pH 7.0 5.0 - 8.0   Glucose, UA NEGATIVE NEGATIVE mg/dL   Hgb urine dipstick NEGATIVE NEGATIVE   Bilirubin Urine NEGATIVE NEGATIVE   Ketones, ur 5 (A) NEGATIVE mg/dL   Protein, ur NEGATIVE NEGATIVE mg/dL   Nitrite POSITIVE (A) NEGATIVE   Leukocytes,Ua NEGATIVE NEGATIVE   RBC / HPF 0-5 0 - 5 RBC/hpf   WBC, UA 0-5 0 - 5 WBC/hpf   Bacteria, UA MANY (A) NONE SEEN   Squamous Epithelial / LPF 0-5 0 - 5    Comment: Performed at [facility], [place] [address], [place], [place] [address]  SARS Coronavirus 2 by RT PCR (hospital order, performed in [facility] hospital lab) Nasopharyngeal Nasopharyngeal Swab     Status: None   Collection Time: 01/15/20  1:54 PM   Specimen: Nasopharyngeal Swab  Result Value Ref Range   SARS Coronavirus 2 NEGATIVE NEGATIVE    Comment: (NOTE) SARS-CoV-2 target nucleic acids are NOT DETECTED. The SARS-CoV-2 RNA is generally detectable in upper and lower respiratory specimens during the acute phase of infection. The lowest concentration of SARS-CoV-2 viral copies this assay can detect is 250 copies / mL. A negative result does not preclude SARS-CoV-2 infection and should not be used as the sole basis for treatment or other patient management decisions.  A negative result may occur with improper specimen collection / handling, submission of specimen other than nasopharyngeal swab, presence of viral mutation(s) within the areas targeted by this assay, and inadequate number of viral copies (<250 copies / mL). A  negative  result must be combined with clinical observations, patient history, and epidemiological information. Fact Sheet for Patients:   StrictlyIdeas.no Fact Sheet for Healthcare Providers: BankingDealers.co.za This test is not yet approved or cleared  by the [place] FDA and has been authorized for detection and/or diagnosis of SARS-CoV-2 by FDA under an Emergency Use Authorization (EUA).  This EUA will remain in effect (meaning this test can be used) for the duration of the COVID-19 declaration under Section 564(b)(1) of the Act, 21 U.S.C. section 360bbb-3(b)(1), unless the authorization is terminated or revoked sooner. Performed at [facility], [place] [address] [place], [place] [address]    CT ABDOMEN PELVIS W CONTRAST  Result Date: 01/15/2020 CLINICAL DATA:  Abdominal pain, distension, constipation, vomiting, not passing gas, question small-bowel obstruction; history of small-bowel obstruction, GERD, hypertension, former smoker EXAM: CT ABDOMEN AND PELVIS WITH CONTRAST TECHNIQUE: Multidetector CT imaging of the abdomen and pelvis was performed using the standard protocol following bolus administration of intravenous contrast. Sagittal and coronal MPR images reconstructed from axial data set. CONTRAST:  161mL OMNIPAQUE IOHEXOL 300 MG/ML SOLN IV. No oral contrast. COMPARISON:  None FINDINGS: Lower chest: Lung bases clear.  Minimal pericardial fluid. Hepatobiliary: Gallbladder and liver normal appearance Pancreas: Normal appearance Spleen: Normal appearance Adrenals/Urinary Tract: Adrenal glands normal appearance. Patchy areas of decreased nephrogram on both portal venous phase and delayed images concerning for pyelonephritis, recommend correlation with urinalysis no hydronephrosis, hydroureter, or ureteral calculi. Bladder unremarkable. Stomach/Bowel: Appendix not visualized. Sigmoid diverticulosis without evidence of diverticulitis.  Colon decompressed. Dilated proximal and decompressed distal small bowel loops consistent with small bowel obstruction. This appears to be secondary to a LEFT spigelian hernia which contains a small bowel loop, associated with obstruction. Stomach mildly distended. Vascular/Lymphatic: Aorta normal caliber. Vascular structures patent. No adenopathy. Reproductive: Uterus and ovaries unremarkable Other: Prior ventral hernia pair without recurrence. No additional hernias. Small amount of free fluid in pelvis. No free air. Musculoskeletal: Scattered degenerative disc disease changes thoracolumbar spine. IMPRESSION: Small-bowel obstruction secondary to a small bowel loop within a LEFT spigelian hernia. Patchy BILATERAL nephrograms question pyelonephritis; recommend correlation with urinalysis. Prior ventral hernia repair. Sigmoid diverticulosis. Electronically Signed   By: Lavonia Dana M.D.   On: 01/15/2020 13:28      Assessment/Plan HTN HLD Hypothyroidism Anxiety S/p right TKA 12/17/19 Dr. Mayer Camel Obese BMI 40.74  SBO secondary to left spigelian hernia - Plan to admit to med-surg. Place NG tube to LIWS. Will discuss with MD, may need surgical repair this admission although she has no leukocytosis, no signs of bowel compromise on CT scan, and no peritonitis on exam so it does not need to be done urgently tonight.   ID - none VTE - SCDs, lovenox FEN - IVF, NPO/NGT to LIWS Foley - none Follow up - TBD   [place], [facility] 01/15/2020, 3:46 PM Please see Amion for pager number during day hours 7:00am-4:30pm

## 2020-01-16 ENCOUNTER — Encounter (HOSPITAL_COMMUNITY): Payer: Self-pay

## 2020-01-16 ENCOUNTER — Inpatient Hospital Stay (HOSPITAL_COMMUNITY): Payer: PPO | Admitting: Certified Registered"

## 2020-01-16 ENCOUNTER — Encounter (HOSPITAL_COMMUNITY): Admission: EM | Disposition: A | Discharge status: Home Health | Payer: Self-pay | Source: Home / Self Care

## 2020-01-16 HISTORY — PX: LAPAROSCOPIC ASSISTED VENTRAL HERNIA REPAIR: SHX6312

## 2020-01-16 LAB — BASIC METABOLIC PANEL
Anion gap: 9 (ref 5–15)
BUN: 6 mg/dL — ABNORMAL LOW (ref 8–23)
CO2: 26 mmol/L (ref 22–32)
Calcium: 9 mg/dL (ref 8.9–10.3)
Chloride: 103 mmol/L (ref 98–111)
Creatinine, Ser: 0.67 mg/dL (ref 0.44–1.00)
GFR calc Af Amer: >60 mL/min (ref >60)
GFR calc non Af Amer: >60 mL/min (ref >60)
Glucose, Bld: 96 mg/dL (ref 70–99)
Potassium: 3.7 mmol/L (ref 3.5–5.1)
Sodium: 138 mmol/L (ref 135–145)

## 2020-01-16 LAB — CBC
HCT: 33.9 % — ABNORMAL LOW (ref 36.0–46.0)
Hemoglobin: 10.5 g/dL — ABNORMAL LOW (ref 12.0–15.0)
MCH: 27.6 pg (ref 26.0–34.0)
MCHC: 31 g/dL (ref 30.0–36.0)
MCV: 89 fL (ref 80.0–100.0)
Platelets: 459 10*3/uL — ABNORMAL HIGH (ref 150–400)
RBC: 3.81 MIL/uL — ABNORMAL LOW (ref 3.87–5.11)
RDW: 15 % (ref 11.5–15.5)
WBC: 5.6 10*3/uL (ref 4.0–10.5)
nRBC: 0 % (ref 0.0–0.2)

## 2020-01-16 LAB — SURGICAL PCR SCREEN
MRSA, PCR: NEGATIVE
Staphylococcus aureus: NEGATIVE

## 2020-01-16 SURGERY — REPAIR, HERNIA, VENTRAL, LAPAROSCOPY-ASSISTED
Anesthesia: General | Site: Abdomen | Laterality: Left

## 2020-01-16 MED ORDER — FENTANYL CITRATE (PF) 100 MCG/2ML IJ SOLN
INTRAMUSCULAR | Status: DC | PRN
  Administered 2020-01-16 (×2): 50 ug via INTRAVENOUS
  Administered 2020-01-16 (×2): 100 ug via INTRAVENOUS

## 2020-01-16 MED ORDER — SUCCINYLCHOLINE CHLORIDE 200 MG/10ML IV SOSY
PREFILLED_SYRINGE | INTRAVENOUS | Status: AC
  Filled 2020-01-16: qty 10

## 2020-01-16 MED ORDER — MIDAZOLAM HCL 2 MG/2ML IJ SOLN
INTRAMUSCULAR | Status: AC
  Filled 2020-01-16: qty 2

## 2020-01-16 MED ORDER — DEXAMETHASONE SODIUM PHOSPHATE 10 MG/ML IJ SOLN
INTRAMUSCULAR | Status: DC | PRN
  Administered 2020-01-16: 8 mg via INTRAVENOUS

## 2020-01-16 MED ORDER — LORAZEPAM 2 MG/ML IJ SOLN: 1.0000 mg | Freq: Three times a day (TID) | INTRAMUSCULAR | Status: DC | PRN

## 2020-01-16 MED ORDER — SUCCINYLCHOLINE CHLORIDE 200 MG/10ML IV SOSY
PREFILLED_SYRINGE | INTRAVENOUS | Status: DC | PRN
  Administered 2020-01-16: 140 mg via INTRAVENOUS

## 2020-01-16 MED ORDER — OXYCODONE HCL 5 MG PO TABS: 5.0000 mg | ORAL_TABLET | Freq: Once | ORAL | Status: DC | PRN

## 2020-01-16 MED ORDER — FENTANYL CITRATE (PF) 250 MCG/5ML IJ SOLN
INTRAMUSCULAR | Status: AC
  Filled 2020-01-16: qty 5

## 2020-01-16 MED ORDER — ONDANSETRON HCL 4 MG/2ML IJ SOLN
INTRAMUSCULAR | Status: AC
  Filled 2020-01-16: qty 2

## 2020-01-16 MED ORDER — OXYCODONE HCL 5 MG/5ML PO SOLN: 5.0000 mg | Freq: Once | ORAL | Status: DC | PRN

## 2020-01-16 MED ORDER — BUPIVACAINE HCL (PF) 0.25 % IJ SOLN
INTRAMUSCULAR | Status: AC
  Filled 2020-01-16: qty 30

## 2020-01-16 MED ORDER — ONDANSETRON HCL 4 MG/2ML IJ SOLN: 4.0000 mg | Freq: Once | INTRAMUSCULAR | Status: DC | PRN

## 2020-01-16 MED ORDER — ONDANSETRON HCL 4 MG/2ML IJ SOLN
INTRAMUSCULAR | Status: DC | PRN
  Administered 2020-01-16: 4 mg via INTRAVENOUS

## 2020-01-16 MED ORDER — SUGAMMADEX SODIUM 200 MG/2ML IV SOLN
INTRAVENOUS | Status: DC | PRN
  Administered 2020-01-16: 200 mg via INTRAVENOUS

## 2020-01-16 MED ORDER — DOCUSATE SODIUM 100 MG PO CAPS
100.0000 mg | ORAL_CAPSULE | Freq: Two times a day (BID) | ORAL | Status: DC
  Administered 2020-01-17 – 2020-01-21 (×9): 100 mg via ORAL
  Filled 2020-01-16 (×9): qty 1

## 2020-01-16 MED ORDER — ACETAMINOPHEN 10 MG/ML IV SOLN
INTRAVENOUS | Status: AC
  Filled 2020-01-16: qty 100

## 2020-01-16 MED ORDER — LACTATED RINGERS IV SOLN: INTRAVENOUS | Status: DC | PRN

## 2020-01-16 MED ORDER — ACETAMINOPHEN 10 MG/ML IV SOLN
INTRAVENOUS | Status: DC | PRN
  Administered 2020-01-16: 1000 mg via INTRAVENOUS

## 2020-01-16 MED ORDER — METHOCARBAMOL 1000 MG/10ML IJ SOLN
500.0000 mg | Freq: Four times a day (QID) | INTRAVENOUS | Status: DC
  Administered 2020-01-16 (×2): 500 mg via INTRAVENOUS
  Filled 2020-01-16 (×4): qty 5

## 2020-01-16 MED ORDER — PROPOFOL 10 MG/ML IV BOLUS
INTRAVENOUS | Status: DC | PRN
  Administered 2020-01-16: 150 mg via INTRAVENOUS

## 2020-01-16 MED ORDER — ROCURONIUM BROMIDE 10 MG/ML (PF) SYRINGE
PREFILLED_SYRINGE | INTRAVENOUS | Status: AC
  Filled 2020-01-16: qty 10

## 2020-01-16 MED ORDER — METHOCARBAMOL 1000 MG/10ML IJ SOLN
500.0000 mg | Freq: Four times a day (QID) | INTRAVENOUS | Status: DC | PRN
  Filled 2020-01-16: qty 5

## 2020-01-16 MED ORDER — OXYCODONE HCL 5 MG PO TABS: 5.0000 mg | ORAL_TABLET | Freq: Four times a day (QID) | ORAL | Status: DC | PRN

## 2020-01-16 MED ORDER — FENTANYL CITRATE (PF) 100 MCG/2ML IJ SOLN: 25.0000 ug | INTRAMUSCULAR | Status: DC | PRN

## 2020-01-16 MED ORDER — PHENYLEPHRINE HCL-NACL 10-0.9 MG/250ML-% IV SOLN
INTRAVENOUS | Status: DC | PRN
  Administered 2020-01-16: 60 ug/min via INTRAVENOUS

## 2020-01-16 MED ORDER — HYDROMORPHONE HCL 1 MG/ML IJ SOLN: 0.5000 mg | INTRAMUSCULAR | Status: DC | PRN

## 2020-01-16 MED ORDER — PHENYLEPHRINE 40 MCG/ML (10ML) SYRINGE FOR IV PUSH (FOR BLOOD PRESSURE SUPPORT)
PREFILLED_SYRINGE | INTRAVENOUS | Status: AC
  Filled 2020-01-16: qty 10

## 2020-01-16 MED ORDER — BUPIVACAINE HCL 0.25 % IJ SOLN
INTRAMUSCULAR | Status: DC | PRN
  Administered 2020-01-16: 28 mL
  Administered 2020-01-16: 2 mL

## 2020-01-16 MED ORDER — ROCURONIUM BROMIDE 10 MG/ML (PF) SYRINGE
PREFILLED_SYRINGE | INTRAVENOUS | Status: DC | PRN
  Administered 2020-01-16: 60 mg via INTRAVENOUS

## 2020-01-16 MED ORDER — MIDAZOLAM HCL 5 MG/5ML IJ SOLN
INTRAMUSCULAR | Status: DC | PRN
  Administered 2020-01-16: 2 mg via INTRAVENOUS

## 2020-01-16 MED ORDER — PROPOFOL 10 MG/ML IV BOLUS
INTRAVENOUS | Status: AC
  Filled 2020-01-16: qty 20

## 2020-01-16 MED ORDER — DEXAMETHASONE SODIUM PHOSPHATE 10 MG/ML IJ SOLN
INTRAMUSCULAR | Status: AC
  Filled 2020-01-16: qty 1

## 2020-01-16 MED ORDER — ACETAMINOPHEN 500 MG PO TABS
1000.0000 mg | ORAL_TABLET | Freq: Four times a day (QID) | ORAL | Status: DC
  Administered 2020-01-17 – 2020-01-21 (×17): 1000 mg via ORAL
  Filled 2020-01-16 (×18): qty 2

## 2020-01-16 MED ORDER — ACETAMINOPHEN 10 MG/ML IV SOLN
1000.0000 mg | Freq: Four times a day (QID) | INTRAVENOUS | Status: DC
  Administered 2020-01-16: 1000 mg via INTRAVENOUS
  Filled 2020-01-16 (×2): qty 100

## 2020-01-16 MED ORDER — LIDOCAINE 2% (20 MG/ML) 5 ML SYRINGE
INTRAMUSCULAR | Status: AC
  Filled 2020-01-16: qty 5

## 2020-01-16 MED ORDER — LIDOCAINE 2% (20 MG/ML) 5 ML SYRINGE
INTRAMUSCULAR | Status: DC | PRN
  Administered 2020-01-16: 60 mg via INTRAVENOUS

## 2020-01-16 MED ORDER — PHENYLEPHRINE 40 MCG/ML (10ML) SYRINGE FOR IV PUSH (FOR BLOOD PRESSURE SUPPORT)
PREFILLED_SYRINGE | INTRAVENOUS | Status: DC | PRN
  Administered 2020-01-16 (×2): 80 ug via INTRAVENOUS

## 2020-01-16 SURGICAL SUPPLY — 63 items
ADH SKN CLS APL DERMABOND .7 (GAUZE/BANDAGES/DRESSINGS) ×1
APL PRP STRL LF DISP 70% ISPRP (MISCELLANEOUS) ×1
APL SKNCLS STERI-STRIP NONHPOA (GAUZE/BANDAGES/DRESSINGS) ×1
APPLIER CLIP 5 13 M/L LIGAMAX5 (MISCELLANEOUS)
APR CLP MED LRG 5 ANG JAW (MISCELLANEOUS)
BENZOIN TINCTURE PRP APPL 2/3 (GAUZE/BANDAGES/DRESSINGS) ×1 IMPLANT
BINDER ABDOMINAL 12 ML 46-62 (SOFTGOODS) ×1 IMPLANT
BLADE 10 SAFETY STRL DISP (BLADE) ×1 IMPLANT
BLADE CLIPPER SURG (BLADE) IMPLANT
BNDG ADH 1X3 SHEER STRL LF (GAUZE/BANDAGES/DRESSINGS) ×2 IMPLANT
BNDG ADH THN 3X1 STRL LF (GAUZE/BANDAGES/DRESSINGS) ×2
CANISTER SUCT 3000ML PPV (MISCELLANEOUS) IMPLANT
CHLORAPREP W/TINT 26 (MISCELLANEOUS) ×2 IMPLANT
CLIP APPLIE 5 13 M/L LIGAMAX5 (MISCELLANEOUS) IMPLANT
COVER SURGICAL LIGHT HANDLE (MISCELLANEOUS) ×2 IMPLANT
COVER WAND RF STERILE (DRAPES) ×2 IMPLANT
DECANTER SPIKE VIAL GLASS SM (MISCELLANEOUS) ×2 IMPLANT
DERMABOND ADVANCED (GAUZE/BANDAGES/DRESSINGS) ×1
DERMABOND ADVANCED .7 DNX12 (GAUZE/BANDAGES/DRESSINGS) ×1 IMPLANT
DEVICE SECURE STRAP 25 ABSORB (INSTRUMENTS) ×2 IMPLANT
DEVICE TROCAR PUNCTURE CLOSURE (ENDOMECHANICALS) ×2 IMPLANT
DRAPE WARM FLUID 44X44 (DRAPES) ×2 IMPLANT
DRSG TEGADERM 4X4.5 CHG (GAUZE/BANDAGES/DRESSINGS) ×1 IMPLANT
ELECT CAUTERY BLADE 6.4 (BLADE) ×1 IMPLANT
ELECT REM PT RETURN 9FT ADLT (ELECTROSURGICAL) ×2
ELECTRODE REM PT RTRN 9FT ADLT (ELECTROSURGICAL) ×1 IMPLANT
GLOVE BIO SURGEON STRL SZ 6 (GLOVE) ×2 IMPLANT
GLOVE INDICATOR 6.5 STRL GRN (GLOVE) ×2 IMPLANT
GOWN STRL REUS W/ TWL LRG LVL3 (GOWN DISPOSABLE) ×3 IMPLANT
GOWN STRL REUS W/TWL 2XL LVL3 (GOWN DISPOSABLE) ×1 IMPLANT
GOWN STRL REUS W/TWL LRG LVL3 (GOWN DISPOSABLE) ×8
KIT BASIN OR (CUSTOM PROCEDURE TRAY) ×2 IMPLANT
KIT TURNOVER KIT B (KITS) ×2 IMPLANT
L-HOOK LAP DISP 36CM (ELECTROSURGICAL) ×2
LHOOK LAP DISP 36CM (ELECTROSURGICAL) ×1 IMPLANT
MARKER SKIN DUAL TIP RULER LAB (MISCELLANEOUS) ×2 IMPLANT
MESH PHASIX RESORB ROUND 7.6CM (Mesh General) ×1 IMPLANT
NDL SPNL 18GX3.5 QUINCKE PK (NEEDLE) ×1 IMPLANT
NEEDLE SPNL 18GX3.5 QUINCKE PK (NEEDLE) ×2 IMPLANT
NS IRRIG 1000ML POUR BTL (IV SOLUTION) ×2 IMPLANT
PAD ABD 8X10 STRL (GAUZE/BANDAGES/DRESSINGS) ×1 IMPLANT
PAD ARMBOARD 7.5X6 YLW CONV (MISCELLANEOUS) ×4 IMPLANT
PENCIL BUTTON HOLSTER BLD 10FT (ELECTRODE) ×2 IMPLANT
SCISSORS LAP 5X35 DISP (ENDOMECHANICALS) IMPLANT
SET IRRIG TUBING LAPAROSCOPIC (IRRIGATION / IRRIGATOR) IMPLANT
SET TUBE SMOKE EVAC HIGH FLOW (TUBING) ×1 IMPLANT
SHEARS HARMONIC ACE PLUS 36CM (ENDOMECHANICALS) ×1 IMPLANT
SLEEVE ENDOPATH XCEL 5M (ENDOMECHANICALS) ×2 IMPLANT
SPONGE GAUZE 2X2 8PLY STRL LF (GAUZE/BANDAGES/DRESSINGS) ×2 IMPLANT
STRIP CLOSURE SKIN 1/2X4 (GAUZE/BANDAGES/DRESSINGS) ×1 IMPLANT
SUT MON AB 4-0 PC3 18 (SUTURE) ×2 IMPLANT
SUT NOVA 1 T20/GS 25DT (SUTURE) ×1 IMPLANT
SUT PDS AB 1 CT1 36 (SUTURE) ×4 IMPLANT
SUT VIC AB 2-0 SH 18 (SUTURE) ×1 IMPLANT
TOWEL GREEN STERILE (TOWEL DISPOSABLE) ×2 IMPLANT
TOWEL GREEN STERILE FF (TOWEL DISPOSABLE) ×2 IMPLANT
TRAY FOLEY W/BAG SLVR 16FR (SET/KITS/TRAYS/PACK)
TRAY FOLEY W/BAG SLVR 16FR ST (SET/KITS/TRAYS/PACK) ×1 IMPLANT
TRAY LAPAROSCOPIC MC (CUSTOM PROCEDURE TRAY) ×2 IMPLANT
TROCAR XCEL 12X100 BLDLESS (ENDOMECHANICALS) ×1 IMPLANT
TROCAR XCEL NON-BLD 11X100MML (ENDOMECHANICALS) ×1 IMPLANT
TROCAR XCEL NON-BLD 5MMX100MML (ENDOMECHANICALS) ×2 IMPLANT
WATER STERILE IRR 1000ML POUR (IV SOLUTION) ×2 IMPLANT

## 2020-01-16 NOTE — Discharge Instructions (Signed)
[facility] _______[facility] ° °HERNIA REPAIR: POST OP INSTRUCTIONS ° °Always review your discharge instruction sheet given to you by the facility where your surgery was performed. °IF YOU HAVE DISABILITY OR FAMILY LEAVE FORMS, YOU MUST BRING THEM TO THE OFFICE FOR PROCESSING.   °DO NOT GIVE THEM TO YOUR DOCTOR. ° °1. A  prescription for pain medication may be given to you upon discharge.  Take your pain medication as prescribed, if needed.  If narcotic pain medicine is not needed, then you may take acetaminophen (Tylenol) or ibuprofen (Advil) as needed. °2. Take your usually prescribed medications unless otherwise directed. °If you need a refill on your pain medication, please contact your pharmacy.  They will contact our office to request authorization. Prescriptions will not be filled after 5 pm or on week-ends. °3. You should follow a light diet the first 24 hours after arrival home, such as soup and crackers, etc.  Be sure to include lots of fluids daily.  Resume your normal diet the day after surgery. °4.Most patients will experience some swelling and bruising around the umbilicus or in the groin and scrotum.  Ice packs and reclining will help.  Swelling and bruising can take several days to resolve.  °6. It is common to experience some constipation if taking pain medication after surgery.  Increasing fluid intake and taking a stool softener (such as Colace) will usually help or prevent this problem from occurring.  A mild laxative (Milk of Magnesia or Miralax) should be taken according to package directions if there are no bowel movements after 48 hours. °7. Unless discharge instructions indicate otherwise, you may remove your bandages 24-48 hours after surgery, and you may shower at that time.  You may have steri-strips (small skin tapes) in place directly over the incision.  These strips should be left on the skin for 7-10 days.  If your surgeon used skin glue on the incision, you may shower in  24 hours.  The glue will flake off over the next 2-3 weeks.  Any sutures or staples will be removed at the office during your follow-up visit. °8. ACTIVITIES:  You may resume regular (light) daily activities beginning the next day--such as daily self-care, walking, climbing stairs--gradually increasing activities as tolerated.  You may have sexual intercourse when it is comfortable.  Refrain from any heavy lifting or straining until approved by your doctor. ° °a.You may drive when you are no longer taking prescription pain medication, you can comfortably wear a seatbelt, and you can safely maneuver your car and apply brakes. °b.RETURN TO WORK:   °_____________________________________________ ° °9.You should see your doctor in the office for a follow-up appointment approximately 2-3 weeks after your surgery.  Make sure that you call for this appointment within a day or two after you arrive home to insure a convenient appointment time. °10.OTHER INSTRUCTIONS: _________________________ °   _____________________________________ ° °WHEN TO CALL YOUR DOCTOR: °1. Fever over 101.0 °2. Inability to urinate °3. Nausea and/or vomiting °4. Extreme swelling or bruising °5. Continued bleeding from incision. °6. Increased pain, redness, or drainage from the incision ° °The clinic staff is available to answer your questions during regular business hours.  Please don’t hesitate to call and ask to speak to one of the nurses for clinical concerns.  If you have a medical emergency, go to the nearest emergency room or call 911.  A surgeon from [facility] is always on call at the hospital ° ° °[address], [address], [place], [place]  [address] ? ° [address] [address], [place], [place]   [address] °[phone] ? [phone] ? FAX [phone] °Web site: [email or web address] ° °

## 2020-01-16 NOTE — Op Note (Signed)
Operative Note  [name] [name]  [id number]  [id number]  01/16/2020   Surgeon: Vikki Ports A ConnorMD  Assistant: Margie Billet PA-C  Procedure performed: diagnostic laparoscopy, repair of incarcerated spigelian hernia with inlay mesh  Preop diagnosis: incarcerated spigelian hernia, small bowel obstruction Post-op diagnosis/intraop findings: same  Specimens: no Retained items: no EBL: minimal cc Complications: none  Description of procedure: After obtaining informed consent the patient was taken to the operating room and placed supine on operating room table wheregeneral endotracheal anesthesia was initiated, preoperative antibiotics were administered, SCDs applied, and a formal timeout was performed.  The abdomen was prepped and draped in the usual sterile fashion.  Peritoneal access was gained using an optical entry in the left upper quadrant and insufflation to 15 mmHg ensued without incident.  Omental adhesions to the periumbilical region are noted from prior ventral hernia repair, and in the left lower quadrant the incarcerated hernia is observed which contains omentum and small bowel.  Under direct visualization an additional right lower quadrant 5 mm trocar was inserted and hernia reduction attempted.  The omentum was densely adherent to the hernia sac and this could not be reduced.  At this point a transverse incision was made in the left lower quadrant over the region of hernia and dissection continued with cautery down to the external oblique muscle which was then divided.  The hernia sac was entered while doing so and a small amount of clear ascites evacuated.  The hernia was noted to contain a loop of small bowel which appeared viable as well as a large segment of incarcerated omentum.  Using cautery, the omentum was amputated at the level of the fascial opening.  The hernia contents were still not reducible, but at this point I was able to get my index finger into the peritoneal  cavity and the hernia defect was extended medially over my finger.  At this point the small bowel easily reduced.  Omental adhesions to the hernia sac were carefully divided with cautery until we had freed the abdominal wall from adhesions.  The hernia sac was excised at the level of the fascia.  We then closed the posterior layer with running #1 PDS starting at either end and tying centrally.  The abdomen was then reinsufflated and the repair inspected, confirming no entrapped contents and that the repair is airtight.  The bowel which had been reduced was again inspected and appears viable without any injury.  The divided omentum is hemostatic.  The abdomen was then again desufflated.  A circular piece of phasix mesh was then placed as an inlay over the posterior fascial repair and secured with interrupted 3-0 Vicryls.  The external oblique was then reapproximated with running #1 PDS starting at either end and tying centrally.  Scarpa's layer was reapproximated with interrupted 3-0 Vicryl's.  Local was infiltrated around each incision and then the 3 skin incisions were closed with subcuticular Monocryl.  Benzoin, Steri-Strips and sterile dressings were applied.  The patient was then awakened, extubated and taken to PACU in stable condition.   All counts were correct at the completion of the case.

## 2020-01-16 NOTE — Anesthesia Preprocedure Evaluation (Addendum)
Anesthesia Evaluation  Patient identified by MRN, date of birth, ID band Patient awake    Reviewed: Allergy & Precautions, NPO status , Patient's Chart, lab work & pertinent test results  History of Anesthesia Complications Negative for: history of anesthetic complications  Airway Mallampati: III  TM Distance: >3 FB Neck ROM: Full   Comment:  NGT in place  Dental  (+) Dental Advisory Given, Teeth Intact   Pulmonary former smoker,    Pulmonary exam normal        Cardiovascular hypertension, Pt. on medications Normal cardiovascular exam   '19 Myoperfusion - Nuclear stress EF: 53%. There was no ST segment deviation noted during stress. No T wave inversion was noted during stress. The study is normal. This is a low risk study. The LVEF is mildly decreased (45-54%).    Neuro/Psych PSYCHIATRIC DISORDERS Anxiety Depression negative neurological ROS     GI/Hepatic Neg liver ROS, GERD  Medicated, SBO    Endo/Other  Hypothyroidism Morbid obesity  Renal/GU negative Renal ROS  Female GU complaint     Musculoskeletal  (+) Arthritis ,   Abdominal (+) + obese,   Peds  (+) ADHD Hematology  (+) anemia ,   Anesthesia Other Findings Covid neg 6/1  Reproductive/Obstetrics                            Anesthesia Physical Anesthesia Plan  ASA: III  Anesthesia Plan: General   Post-op Pain Management:    Induction: Intravenous and Rapid sequence  PONV Risk Score and Plan: 4 or greater and Treatment may vary due to age or medical condition, Ondansetron, Scopolamine patch - Pre-op and Dexamethasone  Airway Management Planned: Oral ETT  Additional Equipment: None  Intra-op Plan:   Post-operative Plan: Extubation in OR  Informed Consent: I have reviewed the patients History and Physical, chart, labs and discussed the procedure including the risks, benefits and alternatives for the  proposed anesthesia with the patient or authorized representative who has indicated his/her understanding and acceptance.     Dental advisory given  Plan Discussed with: CRNA and Anesthesiologist  Anesthesia Plan Comments:       Anesthesia Quick Evaluation

## 2020-01-16 NOTE — Anesthesia Postprocedure Evaluation (Signed)
Anesthesia Post Note  Patient: EARLYN VIRRUETA  Procedure(s) Performed: LAPAROSCOPIC ASSISTED HERNIA REPAIR (Left Abdomen)     Patient location during evaluation: PACU Anesthesia Type: General Level of consciousness: awake and alert Pain management: pain level controlled Vital Signs Assessment: post-procedure vital signs reviewed and stable Respiratory status: spontaneous breathing, nonlabored ventilation and respiratory function stable Cardiovascular status: blood pressure returned to baseline and stable Postop Assessment: no apparent nausea or vomiting Anesthetic complications: no    Last Vitals:  Vitals:   01/16/20 1106 01/16/20 1318  BP: (!) 123/59 136/82  Pulse: 82 95  Resp: 16 17  Temp: 37.1 C (!) 36.3 C  SpO2: 95% 92%    Last Pain:  Vitals:   01/16/20 1318  TempSrc: Oral  PainSc:                  Audry Pili

## 2020-01-16 NOTE — Progress Notes (Signed)
PT Cancellation Note  Patient Details Name: [name] [name] MRN: [id number] DOB: April 26, 1951   Cancelled Treatment:    Reason Eval/Treat Not Completed: Patient at procedure or test/unavailable (OR).    Wyona Almas, PT, DPT Acute Rehabilitation Services Pager [phone] Office [phone]    Deno Etienne 01/16/2020, 7:43 AM

## 2020-01-16 NOTE — Evaluation (Signed)
Physical Therapy Evaluation Patient Details Name: [name] [name] MRN: [id number] DOB: 08-11-1951 Today's Date: 01/16/2020   History of Present Illness  Pt is a 69 y.o. F with significant PMH of HTN, anxiety, recent right TKA 12/17/2019 who presents wtih abdominal pain. CT shows SBO secondary to a small bowel loop wtihin a left spigelian hernia. S/p diagnostic laparoscopy and repair of incarcerated spigelian hernia with inlay mesh.  Clinical Impression  Pt presents with decreased functional mobility secondary to abdominal pain, RLE weakness, and decreased ROM. Ambulating 20 feet with a walker at a min guard assist level. Education provided regarding mobility progression, pillow splinting for comfort, log rolling technique. Suspect steady progress. Will continue to follow acutely to maximize functional mobility.     Follow Up Recommendations Outpatient PT (continued ortho OPPT s/p R TKA)    Equipment Recommendations  None recommended by PT    Recommendations for Other Services       Precautions / Restrictions Precautions Precautions: Fall;Other (comment) Precaution Comments: NG tube Required Braces or Orthoses: Other Brace Other Brace: abdominal binder Restrictions Weight Bearing Restrictions: No      Mobility  Bed Mobility Overal bed mobility: Needs Assistance Bed Mobility: Rolling;Sidelying to Sit Rolling: Modified independent (Device/Increase time) Sidelying to sit: Supervision       General bed mobility comments: Increased time/effort, use of bed rail  Transfers Overall transfer level: Needs assistance Equipment used: Rolling walker (2 wheeled) Transfers: Sit to/from Stand Sit to Stand: Min guard         General transfer comment: Min guard to rise from elevated bed  Ambulation/Gait Ambulation/Gait assistance: Min guard Gait Distance (Feet): 20 Feet Assistive device: Rolling walker (2 wheeled) Gait Pattern/deviations: Step-through pattern;Decreased stride  length Gait velocity: decreased   General Gait Details: Slow and steady pace, good walker proximity and posture  Stairs            Wheelchair Mobility    Modified Rankin (Stroke Patients Only)       Balance Overall balance assessment: Mild deficits observed, not formally tested                                           Pertinent Vitals/Pain Pain Assessment: Faces Faces Pain Scale: Hurts even more Pain Location: abdomen Pain Descriptors / Indicators: Grimacing;Guarding Pain Intervention(s): Monitored during session    Home Living Family/patient expects to be discharged to:: Private residence Living Arrangements: Spouse/significant other Available Help at Discharge: Family Type of Home: House Home Access: Stairs to enter Entrance Stairs-Rails: Can reach both Entrance Stairs-Number of Steps: 2+1 Home Layout: One level Home Equipment: Environmental consultant - 4 wheels;Bedside commode;Grab bars - toilet;Shower seat - built in;Crutches      Prior Function Level of Independence: Needs assistance   Gait / Transfers Assistance Needed: Using crutches vs cane   ADL's / Homemaking Assistance Needed: husband has been assisting some with bathing/getting in/out of shower, lower body dressing        Hand Dominance   Dominant Hand: Right    Extremity/Trunk Assessment   Upper Extremity Assessment Upper Extremity Assessment: Overall WFL for tasks assessed    Lower Extremity Assessment Lower Extremity Assessment: RLE deficits/detail RLE Deficits / Details: Recent R TKA. Unable to perform SLR (limited by abdominal pain), knee flexion > 100 degrees       Communication   Communication: No difficulties  Cognition Arousal/Alertness: Awake/alert  Behavior During Therapy: WFL for tasks assessed/performed Overall Cognitive Status: Within Functional Limits for tasks assessed                                        General Comments      Exercises  General Exercises - Lower Extremity Quad Sets: Both;15 reps;Supine Long Arc Quad: 10 reps;Seated;Both Heel Slides: Right;10 reps;Seated   Assessment/Plan    PT Assessment Patient needs continued PT services  PT Problem List Decreased strength;Decreased activity tolerance;Decreased balance;Decreased mobility;Pain       PT Treatment Interventions DME instruction;Gait training;Stair training;Therapeutic activities;Functional mobility training;Therapeutic exercise;Balance training;Patient/family education    PT Goals (Current goals can be found in the Care Plan section)  Acute Rehab PT Goals Patient Stated Goal: "not lose the range in my knee." PT Goal Formulation: With patient Time For Goal Achievement: 01/30/20 Potential to Achieve Goals: Good    Frequency Min 3X/week   Barriers to discharge        Co-evaluation               AM-PAC PT "6 Clicks" Mobility  Outcome Measure Help needed turning from your back to your side while in a flat bed without using bedrails?: None Help needed moving from lying on your back to sitting on the side of a flat bed without using bedrails?: None Help needed moving to and from a bed to a chair (including a wheelchair)?: A Little Help needed standing up from a chair using your arms (e.g., wheelchair or bedside chair)?: A Little Help needed to walk in hospital room?: A Little Help needed climbing 3-5 steps with a railing? : A Little 6 Click Score: 20    End of Session   Activity Tolerance: Patient tolerated treatment well Patient left: in chair;with call bell/phone within reach Nurse Communication: Mobility status PT Visit Diagnosis: Pain;Difficulty in walking, not elsewhere classified (R26.2) Pain - part of body: (abdomen)    Time: [id number] PT Time Calculation (min) (ACUTE ONLY): 22 min   Charges:   PT Evaluation $PT Eval Moderate Complexity: 1 Mod            Wyona Almas, PT, DPT Acute Rehabilitation Services Pager  [phone] Office [phone]   Deno Etienne 01/16/2020, 2:02 PM

## 2020-01-16 NOTE — Transfer of Care (Signed)
Immediate Anesthesia Transfer of Care Note  Patient: [name] [name]  Procedure(s) Performed: LAPAROSCOPIC ASSISTED HERNIA REPAIR (Left Abdomen)  Patient Location: PACU  Anesthesia Type:General  Level of Consciousness: drowsy and patient cooperative  Airway & Oxygen Therapy: Patient Spontanous Breathing and Patient connected to face mask oxygen  Post-op Assessment: Report given to RN and Post -op Vital signs reviewed and stable  Post vital signs: Reviewed and stable  Last Vitals:  Vitals Value Taken Time  BP 108/53 01/16/20 1010  Temp    Pulse 81 01/16/20 1012  Resp 19 01/16/20 1012  SpO2 95 % 01/16/20 1012  Vitals shown include unvalidated device data.  Last Pain:  Vitals:   01/16/20 0437  TempSrc: Oral  PainSc:          Complications: No apparent anesthesia complications

## 2020-01-16 NOTE — Anesthesia Procedure Notes (Signed)
Procedure Name: Intubation Date/Time: 01/16/2020 8:28 AM Performed by: Orlie Dakin, CRNA Pre-anesthesia Checklist: Patient identified, Emergency Drugs available, Suction available and Patient being monitored Patient Re-evaluated:Patient Re-evaluated prior to induction Oxygen Delivery Method: Circle system utilized Preoxygenation: Pre-oxygenation with 100% oxygen Induction Type: IV induction and Rapid sequence Laryngoscope Size: Miller and 3 Grade View: Grade I Tube type: Oral Tube size: 7.0 mm Number of attempts: 1 Airway Equipment and Method: Stylet Placement Confirmation: ETT inserted through vocal cords under direct vision,  positive ETCO2 and breath sounds checked- equal and bilateral Secured at: 22 cm Tube secured with: Tape Dental Injury: Teeth and Oropharynx as per pre-operative assessment  Comments: NG to wall suction prior to induction and then to gravity drain during induction.  4x4s bite block used.

## 2020-01-16 NOTE — Progress Notes (Signed)
Day of Surgery   Subjective/Chief Complaint: Throat is sore from the NG tube. Nausea and pain are much better. No flatus.    Objective: Vital signs in last 24 hours: Temp:  [98 F (36.7 C)-99.1 F (37.3 C)] 99.1 F (37.3 C) (06/02 0437) Pulse Rate:  [75-92] 88 (06/02 0437) Resp:  [17-19] 19 (06/02 0437) BP: (104-158)/(52-80) 124/60 (06/02 0437) SpO2:  [94 %-97 %] 97 % (06/02 0437) Weight:  [104.3 kg] 104.3 kg (06/01 1041) Last BM Date: 01/15/20  Intake/Output from previous day: 06/01 0701 - 06/02 0700 In: 2301.7 [I.V.:1301.7; IV Piggyback:1000] Out: 2150 [Urine:1300; Emesis/NG output:850] Intake/Output this shift: No intake/output data recorded.  General appearance: alert and cooperative Resp: unlabored GI: soft, still mildly tender along left hemiabdomen, hernia remains vaguely palpable and incarcerated  Lab Results:  Recent Labs    01/15/20 1111 01/16/20 0356  WBC 8.0 5.6  HGB 11.0* 10.5*  HCT 35.5* 33.9*  PLT 472* 459*   BMET Recent Labs    01/15/20 1111 01/16/20 0356  NA 131* 138  K 4.2 3.7  CL 92* 103  CO2 24 26  GLUCOSE 107* 96  BUN 7* 6*  CREATININE 0.60 0.67  CALCIUM 9.9 9.0   PT/INR No results for input(s): LABPROT, INR in the last 72 hours. ABG No results for input(s): PHART, HCO3 in the last 72 hours.  Invalid input(s): PCO2, PO2  Studies/Results: CT ABDOMEN PELVIS W CONTRAST  Result Date: 01/15/2020 CLINICAL DATA:  Abdominal pain, distension, constipation, vomiting, not passing gas, question small-bowel obstruction; history of small-bowel obstruction, GERD, hypertension, former smoker EXAM: CT ABDOMEN AND PELVIS WITH CONTRAST TECHNIQUE: Multidetector CT imaging of the abdomen and pelvis was performed using the standard protocol following bolus administration of intravenous contrast. Sagittal and coronal MPR images reconstructed from axial data set. CONTRAST:  150mL OMNIPAQUE IOHEXOL 300 MG/ML SOLN IV. No oral contrast. COMPARISON:  None  FINDINGS: Lower chest: Lung bases clear.  Minimal pericardial fluid. Hepatobiliary: Gallbladder and liver normal appearance Pancreas: Normal appearance Spleen: Normal appearance Adrenals/Urinary Tract: Adrenal glands normal appearance. Patchy areas of decreased nephrogram on both portal venous phase and delayed images concerning for pyelonephritis, recommend correlation with urinalysis no hydronephrosis, hydroureter, or ureteral calculi. Bladder unremarkable. Stomach/Bowel: Appendix not visualized. Sigmoid diverticulosis without evidence of diverticulitis. Colon decompressed. Dilated proximal and decompressed distal small bowel loops consistent with small bowel obstruction. This appears to be secondary to a LEFT spigelian hernia which contains a small bowel loop, associated with obstruction. Stomach mildly distended. Vascular/Lymphatic: Aorta normal caliber. Vascular structures patent. No adenopathy. Reproductive: Uterus and ovaries unremarkable Other: Prior ventral hernia pair without recurrence. No additional hernias. Small amount of free fluid in pelvis. No free air. Musculoskeletal: Scattered degenerative disc disease changes thoracolumbar spine. IMPRESSION: Small-bowel obstruction secondary to a small bowel loop within a LEFT spigelian hernia. Patchy BILATERAL nephrograms question pyelonephritis; recommend correlation with urinalysis. Prior ventral hernia repair. Sigmoid diverticulosis. Electronically Signed   By: Lavonia Dana M.D.   On: 01/15/2020 13:28   DG Abd Portable 1V  Result Date: 01/15/2020 CLINICAL DATA:  Nasogastric tube placement. EXAM: PORTABLE ABDOMEN - 1 VIEW COMPARISON:  CT of earlier today. FINDINGS: Nasogastric tube terminates at the distal body of the stomach. No gross free intraperitoneal air. Mid small bowel dilatation within the mid and lower abdomen. IMPRESSION: Nasogastric terminating at the distal gastric body. Electronically Signed   By: Abigail Miyamoto M.D.   On: 01/15/2020 18:31     Anti-infectives: Anti-infectives (From admission, onward)  Start     Dose/Rate Route Frequency Ordered Stop   01/16/20 0600  vancomycin (VANCOCIN) IVPB 1000 mg/200 mL premix  Status:  Discontinued     1,000 mg 200 mL/hr over 60 Minutes Intravenous On call to O.R. 01/15/20 1607 01/15/20 1816   01/16/20 0600  vancomycin (VANCOREADY) IVPB 1500 mg/300 mL     1,500 mg 150 mL/hr over 120 Minutes Intravenous On call to O.R. 01/15/20 1816 01/17/20 0559      Assessment/Plan: HTN HLD Hypothyroidism Anxiety S/p right TKA 12/17/19 Dr. Mayer Camel Obese BMI 40.74  SBO secondary to left spigelian hernia OR this morning for laparoscopic assisted repair. Discussed surgical plan and risks of pain, bleeding, scarring, injury to intraabdominal structures, ileus, need for bowel resection, and general cardiovascular/ pulmonary/thromboembolic risks. Increased risk of recurrence, moreso if mesh cannot be used. Questions welcomed and answered.   ID - none VTE - SCDs, lovenox FEN - IVF, NPO/NGT to LIWS Foley - none Follow up - TBD  LOS: 1 day    Clovis Riley 01/16/2020

## 2020-01-17 LAB — BASIC METABOLIC PANEL
Anion gap: 10 (ref 5–15)
BUN: 9 mg/dL (ref 8–23)
CO2: 23 mmol/L (ref 22–32)
Calcium: 8.7 mg/dL — ABNORMAL LOW (ref 8.9–10.3)
Chloride: 107 mmol/L (ref 98–111)
Creatinine, Ser: 0.85 mg/dL (ref 0.44–1.00)
GFR calc Af Amer: >60 mL/min (ref >60)
GFR calc non Af Amer: >60 mL/min (ref >60)
Glucose, Bld: 98 mg/dL (ref 70–99)
Potassium: 3.8 mmol/L (ref 3.5–5.1)
Sodium: 140 mmol/L (ref 135–145)

## 2020-01-17 LAB — CBC
HCT: 27.7 % — ABNORMAL LOW (ref 36.0–46.0)
Hemoglobin: 8.5 g/dL — ABNORMAL LOW (ref 12.0–15.0)
MCH: 27.7 pg (ref 26.0–34.0)
MCHC: 30.7 g/dL (ref 30.0–36.0)
MCV: 90.2 fL (ref 80.0–100.0)
Platelets: 480 10*3/uL — ABNORMAL HIGH (ref 150–400)
RBC: 3.07 MIL/uL — ABNORMAL LOW (ref 3.87–5.11)
RDW: 14.9 % (ref 11.5–15.5)
WBC: 6.6 10*3/uL (ref 4.0–10.5)
nRBC: 0 % (ref 0.0–0.2)

## 2020-01-17 LAB — URINE CULTURE: Culture: 90000 — AB

## 2020-01-17 MED ORDER — LEVOTHYROXINE SODIUM 25 MCG PO TABS
125.0000 ug | ORAL_TABLET | Freq: Every day | ORAL | Status: DC
  Administered 2020-01-17 – 2020-01-21 (×5): 125 ug via ORAL
  Filled 2020-01-17 (×5): qty 1

## 2020-01-17 MED ORDER — POLYETHYLENE GLYCOL 3350 17 G PO PACK: 17.0000 g | PACK | Freq: Every day | ORAL | Status: DC | PRN

## 2020-01-17 MED ORDER — OXYCODONE HCL 5 MG PO TABS
5.0000 mg | ORAL_TABLET | Freq: Four times a day (QID) | ORAL | Status: DC | PRN
  Administered 2020-01-17 – 2020-01-18 (×2): 5 mg via ORAL
  Filled 2020-01-17 (×2): qty 1
  Filled 2020-01-17: qty 2

## 2020-01-17 MED ORDER — LISDEXAMFETAMINE DIMESYLATE 50 MG PO CAPS
50.0000 mg | ORAL_CAPSULE | Freq: Every day | ORAL | Status: DC
  Filled 2020-01-17: qty 1

## 2020-01-17 MED ORDER — DULOXETINE HCL 60 MG PO CPEP
120.0000 mg | ORAL_CAPSULE | Freq: Every day | ORAL | Status: DC
  Administered 2020-01-17 – 2020-01-21 (×5): 120 mg via ORAL
  Filled 2020-01-17 (×5): qty 2

## 2020-01-17 MED ORDER — HYDROMORPHONE HCL 1 MG/ML IJ SOLN: 0.5000 mg | INTRAMUSCULAR | Status: DC | PRN

## 2020-01-17 MED ORDER — ALPRAZOLAM 0.5 MG PO TABS
0.5000 mg | ORAL_TABLET | Freq: Every day | ORAL | Status: DC | PRN
  Administered 2020-01-19: 0.5 mg via ORAL
  Filled 2020-01-17: qty 1

## 2020-01-17 MED ORDER — BUPROPION HCL ER (XL) 150 MG PO TB24
300.0000 mg | ORAL_TABLET | Freq: Every day | ORAL | Status: DC
  Administered 2020-01-17 – 2020-01-21 (×5): 300 mg via ORAL
  Filled 2020-01-17 (×5): qty 2

## 2020-01-17 MED ORDER — PANTOPRAZOLE SODIUM 40 MG PO TBEC
40.0000 mg | DELAYED_RELEASE_TABLET | Freq: Every day | ORAL | Status: DC
  Administered 2020-01-17 – 2020-01-21 (×5): 40 mg via ORAL
  Filled 2020-01-17 (×5): qty 1

## 2020-01-17 NOTE — Progress Notes (Signed)
Physical Therapy Treatment Patient Details Name: [name] [name] MRN: [id number] DOB: 1951-08-13 Today's Date: 01/17/2020    History of Present Illness Pt is a 69 y.o. F with significant PMH of HTN, anxiety, recent right TKA 12/17/2019 who presents wtih abdominal pain. CT shows SBO secondary to a small bowel loop wtihin a left spigelian hernia. S/p diagnostic laparoscopy and repair of incarcerated spigelian hernia with inlay mesh.    PT Comments    Patient required cues for protecting abdominal incision during bed mobility and with TKR exercises. Discussed how to modify supine exercises to standing position to put less stress on abd. Patient walking 160 ft with RW and upright posture. Discussed the intensity of OPPT (original plan to return to that upon discharge). OPPT told her to cancel at least 2 weeks of appointments. Patient could benefit from HHPT to establish safe HEP to do for Rt TKA while abd healing.     Follow Up Recommendations  Home health PT(HHPT until cleared by surgery for intensity of OPPT)     Equipment Recommendations  None recommended by PT    Recommendations for Other Services       Precautions / Restrictions Precautions Precautions: Fall Required Braces or Orthoses: Other Brace Other Brace: abdominal binder Restrictions Weight Bearing Restrictions: No    Mobility  Bed Mobility Overal bed mobility: Needs Assistance Bed Mobility: Rolling;Sidelying to Sit Rolling: Modified independent (Device/Increase time) Sidelying to sit: HOB elevated;Supervision       General bed mobility comments: vc for rolling onto side prior to taking legs off bed; Increased time/effort, use of bed rail; pt has adjustable bed at home  Transfers Overall transfer level: Needs assistance Equipment used: Rolling walker (2 wheeled) Transfers: Sit to/from Stand Sit to Stand: Min guard         General transfer comment: Min guard to rise from elevated  bed  Ambulation/Gait Ambulation/Gait assistance: Min guard Gait Distance (Feet): 160 Feet Assistive device: Rolling walker (2 wheeled) Gait Pattern/deviations: Step-through pattern;Decreased stride length Gait velocity: decreased   General Gait Details: Slow and steady pace (frequent rest breaks due to dyspnea-?anxiety-sats 97% on RA), good walker proximity and posture   Stairs             Wheelchair Mobility    Modified Rankin (Stroke Patients Only)       Balance Overall balance assessment: Mild deficits observed, not formally tested                                          Cognition Arousal/Alertness: Awake/alert Behavior During Therapy: WFL for tasks assessed/performed Overall Cognitive Status: Within Functional Limits for tasks assessed                                        Exercises General Exercises - Lower Extremity Ankle Circles/Pumps: AROM;Both;10 reps Long Arc Quad: 10 reps;Seated;Both;Strengthening(manual resistance to rt) Hip ABduction/ADduction: Strengthening;Both;10 reps;Seated(manual resistance; feet together and pushing knees apart) Hip Flexion/Marching: AROM;Both;5 reps Other Exercises Other Exercises: Educated on NOT doing heelslides and hip abduction in supine due to incr strain on abd. Instructed to perform low marching in standing and standing hip abduction at sink    General Comments General comments (skin integrity, edema, etc.): pt reports she prefers to use crutches and plans to have her  husband bring hers in; discussed should be fine, however extra reps of lifting crutches may incr pain at abd incision      Pertinent Vitals/Pain Pain Assessment: Faces Faces Pain Scale: Hurts even more Pain Location: abdomen Pain Descriptors / Indicators: Grimacing;Guarding Pain Intervention(s): Limited activity within patient's tolerance;Monitored during session;Repositioned    Home Living                       Prior Function            PT Goals (current goals can now be found in the care plan section) Acute Rehab PT Goals Patient Stated Goal: "not lose the range in my knee." Time For Goal Achievement: 01/30/20 Potential to Achieve Goals: Good Progress towards PT goals: Progressing toward goals    Frequency    Min 3X/week      PT Plan Discharge plan needs to be updated    Co-evaluation              AM-PAC PT "6 Clicks" Mobility   Outcome Measure  Help needed turning from your back to your side while in a flat bed without using bedrails?: None Help needed moving from lying on your back to sitting on the side of a flat bed without using bedrails?: None Help needed moving to and from a bed to a chair (including a wheelchair)?: A Little Help needed standing up from a chair using your arms (e.g., wheelchair or bedside chair)?: A Little Help needed to walk in hospital room?: A Little Help needed climbing 3-5 steps with a railing? : A Little 6 Click Score: 20    End of Session   Activity Tolerance: Patient tolerated treatment well Patient left: in chair;with call bell/phone within reach   PT Visit Diagnosis: Pain;Difficulty in walking, not elsewhere classified (R26.2) Pain - part of body: (abdomen)     Time: [id number] PT Time Calculation (min) (ACUTE ONLY): 34 min  Charges:  $Gait Training: 8-22 mins $Therapeutic Exercise: 8-22 mins                      Arby Barrette, PT Pager [phone]    Rexanne Mano 01/17/2020, 4:47 PM

## 2020-01-17 NOTE — TOC Initial Note (Signed)
Transition of Care [facility]) - Initial/Assessment Note    Patient Details  Name: [name] [name] MRN: [id number] Date of Birth: 08-30-1950  Transition of Care [facility]) CM/SW Contact:    Marilu Favre, RN Phone Number: 01/17/2020, 5:05 PM  Clinical Narrative:                  Confirmed face sheet information with patient. Patient from home with husband . Patient has walker and 3 in 1 at home already.  Provided medicare.gov list of home health agencies.  Post knee surgery she had home health PT with [facility], she would like [place] back again. She prefers Erin Neeley HHPT.   NCM called Ennis Forts with [facility] and made request. Ennis Forts will check with office in morning to confirm if they can accept referral for HHPT, and if they can will request Erin. Patient aware.   NCM will follow up in morning.   Will need home health PT orders and face to face  Expected Discharge Plan: [place] Barriers to Discharge: Continued Medical Work up   Patient Goals and CMS Choice Patient states their goals for this hospitalization and ongoing recovery are:: to return to home CMS Medicare.gov Compare Post Acute Care list provided to:: Patient Choice offered to / list presented to : Patient  Expected Discharge Plan and Services Expected Discharge Plan: [place]   Discharge Planning Services: CM Consult Post Acute Care Choice: [place] arrangements for the past 2 months: Single Family Home                 DME Arranged: N/A DME Agency: NA       HH Arranged: PT [employer] Agency: [place] (now [facility]) Date [place]: 01/17/20 Time [place]: [place] Representative spoke with at [place]: Lucina Mellow will check with office in am to see if they can accept  Prior Living Arrangements/Services Living arrangements for the past 2 months: [place] with:: Spouse Patient language and need for interpreter  reviewed:: Yes Do you feel safe going back to the place where you live?: Yes      Need for Family Participation in Patient Care: Yes (Comment) Care giver support system in place?: Yes (comment) Current home services: DME Criminal Activity/Legal Involvement Pertinent to Current Situation/Hospitalization: No - Comment as needed  Activities of Daily Living Home Assistive Devices/Equipment: Crutches, Raised toilet seat with rails ADL Screening (condition at time of admission) Patient's cognitive ability adequate to safely complete daily activities?: Yes Is the patient deaf or have difficulty hearing?: No Does the patient have difficulty seeing, even when wearing glasses/contacts?: No Does the patient have difficulty concentrating, remembering, or making decisions?: No Patient able to express need for assistance with ADLs?: Yes Does the patient have difficulty dressing or bathing?: No Independently performs ADLs?: Yes (appropriate for developmental age) Does the patient have difficulty walking or climbing stairs?: No Weakness of Legs: None Weakness of Arms/Hands: None  Permission Sought/Granted   Permission granted to share information with : No              Emotional Assessment Appearance:: Appears stated age Attitude/Demeanor/Rapport: Engaged Affect (typically observed): Accepting Orientation: : Oriented to Self, Oriented to Place, Oriented to  Time, Oriented to Situation Alcohol / Substance Use: Not Applicable Psych Involvement: No (comment)  Admission diagnosis:  SBO (small bowel obstruction) ([place]) [K56.609] Encounter for imaging study to confirm nasogastric (NG)  tube placement [Z01.89] Spigelian hernia with bowel obstruction [K43.6] Patient Active Problem List   Diagnosis Date Noted  . SBO (small bowel obstruction) ([place]) 01/15/2020  . S/P TKR (total knee replacement), right 12/19/2019  . Total knee replacement status, right 12/17/2019  . Osteoarthritis of right knee  12/14/2019  . Obesity, morbid ([place]) 12/16/2016  . Strain of iliopsoas muscle, initial encounter 07/15/2016  . Elevated cholesterol   . Urinary incontinence   . Endometrial polyp   . Primary osteoarthritis of knees, bilateral 05/15/2009  . DERANGEMENT OF ANTERIOR HORN OF LATERAL MENISCUS 05/15/2009  . KNEE PAIN 05/15/2009   PCP:  Maury Dus, MD Pharmacy:   [facility] ([place]) [place], [place] [address] Phone: [phone] Fax: [phone]  [id number] - [place], [place] [id number] [place] [id number] Phone: [phone] Fax: [phone]  [facility] - [place], [place] - [address] [place] [address] Phone: [phone] Fax: [phone]     Social Determinants of Health (SDOH) Interventions    Readmission Risk Interventions No flowsheet data found.

## 2020-01-17 NOTE — Progress Notes (Signed)
1 Day Post-Op  Subjective: CC: Doing well.  Notes some mild pain around left sided incision.  NG tube in place.  Having some throat discomfort.  No flatus or BM.  Mobilizing in room.  Objective: Vital signs in last 24 hours: Temp:  [97.4 F (36.3 C)-98.7 F (37.1 C)] 98.2 F (36.8 C) (06/03 0531) Pulse Rate:  [79-100] 94 (06/03 0531) Resp:  [13-20] 15 (06/03 0531) BP: (100-136)/(47-82) 106/49 (06/03 0531) SpO2:  [92 %-95 %] 93 % (06/03 0531) Last BM Date: 01/15/20  Intake/Output from previous day: 06/02 0701 - 06/03 0700 In: 3328.8 [P.O.:100; I.V.:2628.8; IV Piggyback:600] Out: 825 [Urine:625; Emesis/NG output:200] Intake/Output this shift: No intake/output data recorded.  PE: Gen:  Alert, NAD, pleasant HEENT: NGT in place. Scant amount of output in cannister. 200cc/24 hours.  Pulm: normal rate and effort  Abd: Soft, ND, appropriately tenderness over the left lateral incision. Otherwise NT. +BS, incisions dressed with c/d/i dressings. Abd binder in place Ext:  DP 2+ Psych: A&Ox3  Skin: no rashes noted, warm and dry   Lab Results:  Recent Labs    01/16/20 0356 01/17/20 0250  WBC 5.6 6.6  HGB 10.5* 8.5*  HCT 33.9* 27.7*  PLT 459* 480*   BMET Recent Labs    01/16/20 0356 01/17/20 0250  NA 138 140  K 3.7 3.8  CL 103 107  CO2 26 23  GLUCOSE 96 98  BUN 6* 9  CREATININE 0.67 0.85  CALCIUM 9.0 8.7*   PT/INR No results for input(s): LABPROT, INR in the last 72 hours. CMP     Component Value Date/Time   NA 140 01/17/2020 0250   K 3.8 01/17/2020 0250   CL 107 01/17/2020 0250   CO2 23 01/17/2020 0250   GLUCOSE 98 01/17/2020 0250   BUN 9 01/17/2020 0250   CREATININE 0.85 01/17/2020 0250   CALCIUM 8.7 (L) 01/17/2020 0250   PROT 6.8 01/15/2020 1111   ALBUMIN 3.5 01/15/2020 1111   AST 19 01/15/2020 1111   ALT 18 01/15/2020 1111   ALKPHOS 111 01/15/2020 1111   BILITOT 1.1 01/15/2020 1111   GFRNONAA >60 01/17/2020 0250   GFRAA >60 01/17/2020 0250    Lipase     Component Value Date/Time   LIPASE 17 01/15/2020 1111       Studies/Results: CT ABDOMEN PELVIS W CONTRAST  Result Date: 01/15/2020 CLINICAL DATA:  Abdominal pain, distension, constipation, vomiting, not passing gas, question small-bowel obstruction; history of small-bowel obstruction, GERD, hypertension, former smoker EXAM: CT ABDOMEN AND PELVIS WITH CONTRAST TECHNIQUE: Multidetector CT imaging of the abdomen and pelvis was performed using the standard protocol following bolus administration of intravenous contrast. Sagittal and coronal MPR images reconstructed from axial data set. CONTRAST:  164mL OMNIPAQUE IOHEXOL 300 MG/ML SOLN IV. No oral contrast. COMPARISON:  None FINDINGS: Lower chest: Lung bases clear.  Minimal pericardial fluid. Hepatobiliary: Gallbladder and liver normal appearance Pancreas: Normal appearance Spleen: Normal appearance Adrenals/Urinary Tract: Adrenal glands normal appearance. Patchy areas of decreased nephrogram on both portal venous phase and delayed images concerning for pyelonephritis, recommend correlation with urinalysis no hydronephrosis, hydroureter, or ureteral calculi. Bladder unremarkable. Stomach/Bowel: Appendix not visualized. Sigmoid diverticulosis without evidence of diverticulitis. Colon decompressed. Dilated proximal and decompressed distal small bowel loops consistent with small bowel obstruction. This appears to be secondary to a LEFT spigelian hernia which contains a small bowel loop, associated with obstruction. Stomach mildly distended. Vascular/Lymphatic: Aorta normal caliber. Vascular structures patent. No adenopathy. Reproductive: Uterus and ovaries unremarkable  Other: Prior ventral hernia pair without recurrence. No additional hernias. Small amount of free fluid in pelvis. No free air. Musculoskeletal: Scattered degenerative disc disease changes thoracolumbar spine. IMPRESSION: Small-bowel obstruction secondary to a small bowel loop within  a LEFT spigelian hernia. Patchy BILATERAL nephrograms question pyelonephritis; recommend correlation with urinalysis. Prior ventral hernia repair. Sigmoid diverticulosis. Electronically Signed   By: Lavonia Dana M.D.   On: 01/15/2020 13:28   DG Abd Portable 1V  Result Date: 01/15/2020 CLINICAL DATA:  Nasogastric tube placement. EXAM: PORTABLE ABDOMEN - 1 VIEW COMPARISON:  CT of earlier today. FINDINGS: Nasogastric tube terminates at the distal body of the stomach. No gross free intraperitoneal air. Mid small bowel dilatation within the mid and lower abdomen. IMPRESSION: Nasogastric terminating at the distal gastric body. Electronically Signed   By: Abigail Miyamoto M.D.   On: 01/15/2020 18:31    Anti-infectives: Anti-infectives (From admission, onward)   Start     Dose/Rate Route Frequency Ordered Stop   01/16/20 0600  vancomycin (VANCOCIN) IVPB 1000 mg/200 mL premix  Status:  Discontinued     1,000 mg 200 mL/hr over 60 Minutes Intravenous On call to O.R. 01/15/20 1607 01/15/20 1816   01/16/20 0600  vancomycin (VANCOREADY) IVPB 1500 mg/300 mL     1,500 mg 150 mL/hr over 120 Minutes Intravenous On call to O.R. 01/15/20 1816 01/16/20 1032       Assessment/Plan HTN - Soft BP this AM. Hold home BP meds  HLD Hypothyroidism - home meds  Anxiety S/p right TKA 12/17/19 Dr. Mayer Camel Obese BMI 40.74 UA w/ <100,000 colonies - no indication for abx ABL Anemia - hold Lovenox. AM labs   SBO secondary to left spigelian hernia S/p diagnostic laparoscopy, repair of incarcerated spigelian hernia with inlay mesh - Dr. Kae Heller - 01/16/2020 - POD #1 - D/C NGT. Start CLD - Abd binder  - Mobilize, PT/OT - Pulm toilet, IS  FEN - CLD VTE - SCDs, hold lovenox as above ID - Vanc periop. None currently     LOS: 2 days    [name] [name] , [facility] 01/17/2020, 7:47 AM Please see Amion for pager number during day hours 7:00am-4:30pm

## 2020-01-18 LAB — CBC
HCT: 23.8 % — ABNORMAL LOW (ref 36.0–46.0)
Hemoglobin: 7.2 g/dL — ABNORMAL LOW (ref 12.0–15.0)
MCH: 27.2 pg (ref 26.0–34.0)
MCHC: 30.3 g/dL (ref 30.0–36.0)
MCV: 89.8 fL (ref 80.0–100.0)
Platelets: 469 10*3/uL — ABNORMAL HIGH (ref 150–400)
RBC: 2.65 MIL/uL — ABNORMAL LOW (ref 3.87–5.11)
RDW: 14.9 % (ref 11.5–15.5)
WBC: 7 10*3/uL (ref 4.0–10.5)
nRBC: 0 % (ref 0.0–0.2)

## 2020-01-18 LAB — HEMOGLOBIN AND HEMATOCRIT, BLOOD
HCT: 25.8 % — ABNORMAL LOW (ref 36.0–46.0)
Hemoglobin: 7.9 g/dL — ABNORMAL LOW (ref 12.0–15.0)

## 2020-01-18 LAB — BASIC METABOLIC PANEL
Anion gap: 9 (ref 5–15)
BUN: 10 mg/dL (ref 8–23)
CO2: 25 mmol/L (ref 22–32)
Calcium: 8.5 mg/dL — ABNORMAL LOW (ref 8.9–10.3)
Chloride: 103 mmol/L (ref 98–111)
Creatinine, Ser: 0.56 mg/dL (ref 0.44–1.00)
GFR calc Af Amer: >60 mL/min (ref >60)
GFR calc non Af Amer: >60 mL/min (ref >60)
Glucose, Bld: 116 mg/dL — ABNORMAL HIGH (ref 70–99)
Potassium: 3.3 mmol/L — ABNORMAL LOW (ref 3.5–5.1)
Sodium: 137 mmol/L (ref 135–145)

## 2020-01-18 MED ORDER — LISDEXAMFETAMINE DIMESYLATE 20 MG PO CAPS
20.0000 mg | ORAL_CAPSULE | Freq: Every day | ORAL | Status: DC
  Administered 2020-01-19 – 2020-01-20 (×2): 20 mg via ORAL
  Filled 2020-01-18 (×4): qty 1

## 2020-01-18 MED ORDER — LISDEXAMFETAMINE DIMESYLATE 30 MG PO CAPS
30.0000 mg | ORAL_CAPSULE | Freq: Every day | ORAL | Status: DC
  Administered 2020-01-19 – 2020-01-20 (×2): 30 mg via ORAL
  Filled 2020-01-18 (×4): qty 1

## 2020-01-18 MED ORDER — ENSURE ENLIVE PO LIQD
237.0000 mL | Freq: Three times a day (TID) | ORAL | Status: DC
  Administered 2020-01-18 – 2020-01-21 (×9): 237 mL via ORAL

## 2020-01-18 MED ORDER — ENSURE ENLIVE PO LIQD: 237.0000 mL | Freq: Two times a day (BID) | ORAL | Status: DC

## 2020-01-18 MED ORDER — POTASSIUM CHLORIDE CRYS ER 20 MEQ PO TBCR
40.0000 meq | EXTENDED_RELEASE_TABLET | Freq: Two times a day (BID) | ORAL | Status: AC
  Administered 2020-01-18 (×2): 40 meq via ORAL
  Filled 2020-01-18 (×2): qty 2

## 2020-01-18 NOTE — Progress Notes (Addendum)
2 Days Post-Op  Subjective: CC: Tolerating cld without emesis. Did have some nausea/heartburn in the middle of the night that improved with zofran. Passing flatus. No BM. Mobilized with PT yesterday.   Objective: Vital signs in last 24 hours: Temp:  [97.5 F (36.4 C)-100.2 F (37.9 C)] 98.6 F (37 C) (06/04 0528) Pulse Rate:  [82-91] 82 (06/04 0528) Resp:  [17-18] 17 (06/04 0528) BP: (101-126)/(48-50) 126/48 (06/04 0528) SpO2:  [94 %-97 %] 94 % (06/04 0528) Last BM Date: 01/15/20  Intake/Output from previous day: 06/03 0701 - 06/04 0700 In: 791.6 [P.O.:100; I.V.:691.6] Out: 700 [Urine:700] Intake/Output this shift: No intake/output data recorded.  PE: Gen:  Alert, NAD, pleasant Lungs: Normal rate and effort  Abd: Soft, ND, NT, +BS, incisions c/d/i with steri-strips in place. Abd binder in place Ext:  DP 2+ Psych: A&Ox3  Skin: no rashes noted, warm and dry  Lab Results:  Recent Labs    01/17/20 0250 01/18/20 0251  WBC 6.6 7.0  HGB 8.5* 7.2*  HCT 27.7* 23.8*  PLT 480* 469*   BMET Recent Labs    01/17/20 0250 01/18/20 0251  NA 140 137  K 3.8 3.3*  CL 107 103  CO2 23 25  GLUCOSE 98 116*  BUN 9 10  CREATININE 0.85 0.56  CALCIUM 8.7* 8.5*   PT/INR No results for input(s): LABPROT, INR in the last 72 hours. CMP     Component Value Date/Time   NA 137 01/18/2020 0251   K 3.3 (L) 01/18/2020 0251   CL 103 01/18/2020 0251   CO2 25 01/18/2020 0251   GLUCOSE 116 (H) 01/18/2020 0251   BUN 10 01/18/2020 0251   CREATININE 0.56 01/18/2020 0251   CALCIUM 8.5 (L) 01/18/2020 0251   PROT 6.8 01/15/2020 1111   ALBUMIN 3.5 01/15/2020 1111   AST 19 01/15/2020 1111   ALT 18 01/15/2020 1111   ALKPHOS 111 01/15/2020 1111   BILITOT 1.1 01/15/2020 1111   GFRNONAA >60 01/18/2020 0251   GFRAA >60 01/18/2020 0251   Lipase     Component Value Date/Time   LIPASE 17 01/15/2020 1111       Studies/Results: No results found.  Anti-infectives: Anti-infectives  (From admission, onward)   Start     Dose/Rate Route Frequency Ordered Stop   01/16/20 0600  vancomycin (VANCOCIN) IVPB 1000 mg/200 mL premix  Status:  Discontinued     1,000 mg 200 mL/hr over 60 Minutes Intravenous On call to O.R. 01/15/20 1607 01/15/20 1816   01/16/20 0600  vancomycin (VANCOREADY) IVPB 1500 mg/300 mL     1,500 mg 150 mL/hr over 120 Minutes Intravenous On call to O.R. 01/15/20 1816 01/16/20 1032       Assessment/Plan HTN - Soft BP yesterday. Hold home BP meds  HLD Hypothyroidism - home meds  Anxiety S/p right TKA 12/17/19 Dr. Mayer Camel Obese BMI 40.74 UA w/ <100,000 colonies - no indication for abx ABL Anemia - hgb 7.2. Hold Lovenox. AM labs   SBO secondary to left spigelian hernia S/p diagnostic laparoscopy, repair of incarcerated spigelian hernia with inlay mesh - Dr. Kae Heller - 01/16/2020 - POD #2 - Adv diet. D/c IVF - Abd binder  - Mobilize, PT/OT - Pulm toilet, IS  FEN - Soft, ensure, d/c IVF, replace K VTE - SCDs, hold lovenox as above ID - Vanc periop. None currently    LOS: 3 days    Jillyn Ledger , [facility] 01/18/2020, 8:40 AM Please see Amion  for pager number during day hours 7:00am-4:30pm

## 2020-01-18 NOTE — TOC Progression Note (Addendum)
Transition of Care [facility]) - Progression Note    Patient Details  Name: CINDE EBERT MRN: [id number] Date of Birth: 05/15/1951  Transition of Care [facility]) CM/SW Contact  Karelyn Brisby, Edson Snowball, RN Phone Number: 01/18/2020, 10:43 AM  Clinical Narrative:     Damaris Schooner to [place] with [facility]. Unfortunately [facility] is unable to accept referral.   Spoke to patient and husband at bedside and explained above. Provided medicare.gov home health agency list.   Husband asking about custodial care through [place]. NCM explained we will submit for same , takes up to 14 days for approval. Voiced understanding. Patient and husband would like to have [place] for [place] and for Custodial care. NCM spoke to [place] with [facility]. Cindie accepted referral for HHPT and will call Rachael with [facility] private duty to submit for custodial care. Patient and husband aware. Expected Discharge Plan: [place] Barriers to Discharge: Continued Medical Work up  Expected Discharge Plan and Services Expected Discharge Plan: [place]   Discharge Planning Services: CM Consult Post Acute Care Choice: [place] arrangements for the past 2 months: Single Family Home                 DME Arranged: N/A DME Agency: NA       HH Arranged: PT [place]: [place] (now [facility]) Date [place]: 01/17/20 Time [place]: [place] Representative spoke with at [place]: Lucina Mellow will check with office in am to see if they can accept   Social Determinants of Health (SDOH) Interventions    Readmission Risk Interventions No flowsheet data found.

## 2020-01-18 NOTE — Progress Notes (Signed)
PT Cancellation Note  Patient Details Name: [name] [name] MRN: [id number] DOB: 05-03-1951   Cancelled Treatment:    Reason Eval/Treat Not Completed: Other (comment).  Pt was attempted but is feeling sick and in pain on L knee, but nursing has medicated.  Poor sleep last night, may be willing to do therapy later today.  Reattempt as time and pt allow.   Ramond Dial 01/18/2020, 12:44 PM   Mee Hives, PT MS Acute Rehab Dept. Number: [place] and [place]

## 2020-01-18 NOTE — Progress Notes (Signed)
Physical Therapy Treatment Patient Details Name: [name] [name] MRN: [id number] DOB: [birth year] Today's Date: 01/18/2020    History of Present Illness Pt is a 69 y.o. F with significant PMH of HTN, anxiety, recent right TKA 12/17/2019 who presents wtih abdominal pain. CT shows SBO secondary to a small bowel loop wtihin a left spigelian hernia. S/p diagnostic laparoscopy and repair of incarcerated spigelian hernia with inlay mesh.    PT Comments    Pt progressing well towards her physical therapy goals, reports she is ambulating 3x/day. Ambulating 160 feet with crutches using 4 point reciprocal pattern. Rest of session focused on seated exercises for R knee strengthening and ROM. Pt is very motivated to maintain and progress mobility as tolerated.    Follow Up Recommendations  Home health PT(HHPT until cleared by surgery for intensity of OPPT)     Equipment Recommendations  None recommended by PT    Recommendations for Other Services       Precautions / Restrictions Precautions Precautions: Fall Required Braces or Orthoses: Other Brace Other Brace: abdominal binder Restrictions Weight Bearing Restrictions: No    Mobility  Bed Mobility               General bed mobility comments: OOB in chair  Transfers Overall transfer level: Needs assistance Equipment used: Crutches Transfers: Sit to/from Stand Sit to Stand: Min assist         General transfer comment: MinA to rise from low surface  Ambulation/Gait Ambulation/Gait assistance: Min guard Gait Distance (Feet): 160 Feet Assistive device: Crutches Gait Pattern/deviations: Step-through pattern;Decreased stride length Gait velocity: decreased   General Gait Details: Cues for upright posture, 4 point reciprocal pattern with crutches   Stairs             Wheelchair Mobility    Modified Rankin (Stroke Patients Only)       Balance Overall balance assessment: Mild deficits observed, not formally tested                                          Cognition Arousal/Alertness: Awake/alert Behavior During Therapy: WFL for tasks assessed/performed Overall Cognitive Status: Within Functional Limits for tasks assessed                                        Exercises General Exercises - Lower Extremity Long Arc Quad: 10 reps;Seated;Both;Strengthening Heel Slides: Right;10 reps;Seated Hip Flexion/Marching: AROM;Both;10 reps    General Comments        Pertinent Vitals/Pain Pain Assessment: Faces Faces Pain Scale: Hurts even more Pain Location: abdomen, L knee Pain Descriptors / Indicators: Grimacing;Guarding Pain Intervention(s): Monitored during session    Home Living                      Prior Function            PT Goals (current goals can now be found in the care plan section) Acute Rehab PT Goals Patient Stated Goal: "not lose the range in my knee." Time For Goal Achievement: 01/30/20 Potential to Achieve Goals: Good Progress towards PT goals: Progressing toward goals    Frequency    Min 3X/week      PT Plan Current plan remains appropriate    Co-evaluation  AM-PAC PT "6 Clicks" Mobility   Outcome Measure  Help needed turning from your back to your side while in a flat bed without using bedrails?: None Help needed moving from lying on your back to sitting on the side of a flat bed without using bedrails?: None Help needed moving to and from a bed to a chair (including a wheelchair)?: A Little Help needed standing up from a chair using your arms (e.g., wheelchair or bedside chair)?: A Little Help needed to walk in hospital room?: A Little Help needed climbing 3-5 steps with a railing? : A Little 6 Click Score: 20    End of Session   Activity Tolerance: Patient tolerated treatment well Patient left: in chair;with call bell/phone within reach   PT Visit Diagnosis: Pain;Difficulty in walking, not  elsewhere classified (R26.2) Pain - part of body: (abdomen)     Time: 6468-0321 PT Time Calculation (min) (ACUTE ONLY): 25 min  Charges:  $Gait Training: 8-22 mins $Therapeutic Activity: 8-22 mins                       Wyona Almas, PT, DPT Acute Rehabilitation Services Pager [phone] Office [phone]    Deno Etienne 01/18/2020, 5:28 PM

## 2020-01-19 LAB — CBC
HCT: 23.6 % — ABNORMAL LOW (ref 36.0–46.0)
Hemoglobin: 7.1 g/dL — ABNORMAL LOW (ref 12.0–15.0)
MCH: 26.9 pg (ref 26.0–34.0)
MCHC: 30.1 g/dL (ref 30.0–36.0)
MCV: 89.4 fL (ref 80.0–100.0)
Platelets: 469 10*3/uL — ABNORMAL HIGH (ref 150–400)
RBC: 2.64 MIL/uL — ABNORMAL LOW (ref 3.87–5.11)
RDW: 14.9 % (ref 11.5–15.5)
WBC: 9.1 10*3/uL (ref 4.0–10.5)
nRBC: 0 % (ref 0.0–0.2)

## 2020-01-19 MED ORDER — PROSIGHT PO TABS
1.0000 | ORAL_TABLET | Freq: Every day | ORAL | Status: DC
  Administered 2020-01-19 – 2020-01-21 (×3): 1 via ORAL
  Filled 2020-01-19 (×3): qty 1

## 2020-01-19 MED ORDER — GLYCERIN (LAXATIVE) 2.1 G RE SUPP
1.0000 | Freq: Once | RECTAL | Status: AC
  Administered 2020-01-19: 1 via RECTAL
  Filled 2020-01-19: qty 1

## 2020-01-19 NOTE — Progress Notes (Signed)
[place] [facility] Office:  [phone] General Surgery Progress Note   LOS: 4 days  POD -  3 Days Post-Op  Assessment and Plan: 1.  LAPAROSCOPIC ASSISTED HERNIA REPAIR - 01/16/2020 - Connor  Spigelian hernia  On reg diet - though limited bowel function - no quite ready to go home  2.  Anemia  Post op blood loss  Hgb - 7.1 - 01/19/2020  3.  HTN 4.  Hypothyroidism - home meds 5.  Anxiety 6.  S/p right TKA 12/17/19 Dr. Mayer Camel 7.  DVT prophylaxis - on hold 8.  Hypokalemia  K+ - 3.2 - 01/19/2020  On replacement   Active Problems:   SBO (small bowel obstruction) (HCC)  Subjective:  Had knee replacement just 6 weeks ago and is struggling moving and with abdominal pain.  Only one BM so far and worried about bowels  Husband, Bill, in room.  Objective:   Vitals:   01/18/20 2011 01/19/20 0432  BP: (!) 116/42 (!) 100/42  Pulse: 84 83  Resp: 17 18  Temp: 98.1 F (36.7 C) 98.4 F (36.9 C)  SpO2: 98% 96%     Intake/Output from previous day:  06/04 0701 - 06/05 0700 In: 400 [P.O.:400] Out: -   Intake/Output this shift:  No intake/output data recorded.   Physical Exam:   General: obese WF who is alert and oriented.    HEENT: Normal. Pupils equal. .   Lungs: clear   Abdomen: Soft, though not many BS   Wound: LLQ wound full feeling - probable hematoma   Lab Results:    Recent Labs    01/18/20 0251 01/18/20 0251 01/18/20 1422 01/19/20 0207  WBC 7.0  --   --  9.1  HGB 7.2*   < > 7.9* 7.1*  HCT 23.8*   < > 25.8* 23.6*  PLT 469*  --   --  469*   < > = values in this interval not displayed.    BMET   Recent Labs    01/17/20 0250 01/18/20 0251  NA 140 137  K 3.8 3.3*  CL 107 103  CO2 23 25  GLUCOSE 98 116*  BUN 9 10  CREATININE 0.85 0.56  CALCIUM 8.7* 8.5*    PT/INR  No results for input(s): LABPROT, INR in the last 72 hours.  ABG  No results for input(s): PHART, HCO3 in the last 72 hours.  Invalid input(s): PCO2, PO2   Studies/Results:  No  results found.   Anti-infectives:   Anti-infectives (From admission, onward)   Start     Dose/Rate Route Frequency Ordered Stop   01/16/20 0600  vancomycin (VANCOCIN) IVPB 1000 mg/200 mL premix  Status:  Discontinued     1,000 mg 200 mL/hr over 60 Minutes Intravenous On call to O.R. 01/15/20 1607 01/15/20 1816   01/16/20 0600  vancomycin (VANCOREADY) IVPB 1500 mg/300 mL     1,500 mg 150 mL/hr over 120 Minutes Intravenous On call to O.R. 01/15/20 1816 01/16/20 [place], MD, [facility] Surgery Office: [phone] 01/19/2020

## 2020-01-20 LAB — CBC WITH DIFFERENTIAL/PLATELET
Abs Immature Granulocytes: 0.06 10*3/uL (ref 0.00–0.07)
Basophils Absolute: 0.1 10*3/uL (ref 0.0–0.1)
Basophils Relative: 1 %
Eosinophils Absolute: 0.6 10*3/uL — ABNORMAL HIGH (ref 0.0–0.5)
Eosinophils Relative: 7 %
HCT: 22.2 % — ABNORMAL LOW (ref 36.0–46.0)
Hemoglobin: 6.8 g/dL — CL (ref 12.0–15.0)
Immature Granulocytes: 1 %
Lymphocytes Relative: 25 %
Lymphs Abs: 2.1 10*3/uL (ref 0.7–4.0)
MCH: 27.5 pg (ref 26.0–34.0)
MCHC: 30.6 g/dL (ref 30.0–36.0)
MCV: 89.9 fL (ref 80.0–100.0)
Monocytes Absolute: 1 10*3/uL (ref 0.1–1.0)
Monocytes Relative: 12 %
Neutro Abs: 4.7 10*3/uL (ref 1.7–7.7)
Neutrophils Relative %: 54 %
Platelets: 493 10*3/uL — ABNORMAL HIGH (ref 150–400)
RBC: 2.47 MIL/uL — ABNORMAL LOW (ref 3.87–5.11)
RDW: 15.2 % (ref 11.5–15.5)
WBC: 8.5 10*3/uL (ref 4.0–10.5)
nRBC: 0.4 % — ABNORMAL HIGH (ref 0.0–0.2)

## 2020-01-20 LAB — HEMOGLOBIN AND HEMATOCRIT, BLOOD
HCT: 30 % — ABNORMAL LOW (ref 36.0–46.0)
Hemoglobin: 9.1 g/dL — ABNORMAL LOW (ref 12.0–15.0)

## 2020-01-20 LAB — PREPARE RBC (CROSSMATCH)

## 2020-01-20 LAB — ABO/RH: ABO/RH(D): A POS

## 2020-01-20 MED ORDER — SODIUM CHLORIDE 0.9% IV SOLUTION: Freq: Once | INTRAVENOUS | Status: AC

## 2020-01-20 MED ORDER — SODIUM CHLORIDE 0.9% IV SOLUTION: Freq: Once | INTRAVENOUS | Status: DC

## 2020-01-20 MED ORDER — ALPRAZOLAM 0.5 MG PO TABS
0.5000 mg | ORAL_TABLET | Freq: Two times a day (BID) | ORAL | Status: DC | PRN
  Administered 2020-01-20 (×2): 0.5 mg via ORAL
  Filled 2020-01-20 (×2): qty 1

## 2020-01-20 NOTE — Progress Notes (Addendum)
[place] [facility] Office:  [phone] General Surgery Progress Note   LOS: 5 days  POD -  4 Days Post-Op  Assessment and Plan: 1.  LAPAROSCOPIC ASSISTED HERNIA REPAIR - 01/16/2020 - [name]  [name] hernia  Hematoma in LLQ at wound site, but stable  On regular diet 2.  Anemia  Post op blood loss  Hgb - 6.8 - 01/20/2020  Dr. Barry Dienes wrote to transfuse 1 unit PRBC 3.  HTN 4.  Hypothyroidism - home meds 5.  Anxiety - has Xanax 6.  S/p right TKA 12/17/19 Dr. Mayer Camel 7.  DVT prophylaxis - on hold 8.  Hypokalemia  K+ - 3.2 - 01/19/2020  On replacement   Active Problems:   SBO (small bowel obstruction) (HCC)  Subjective:  Very anxious.  But otherwise seems okay.  With blood transfusion, will not send home today, but recheck hgb tomorrow.  Husband, [name] [name], in room.  Objective:   Vitals:   01/20/20 0813 01/20/20 0839  BP: (!) 104/36 (!) 99/44  Pulse: 68 81  Resp: 20 20  Temp: 98.1 F (36.7 C) 98.1 F (36.7 C)  SpO2: 95% 98%     Intake/Output from previous day:  06/05 0701 - 06/06 0700 In: 800 [P.O.:800] Out: 400 [Urine:400]  Intake/Output this shift:  Total I/O In: 250 [I.V.:250] Out: -    Physical Exam:   General: obese WF who is alert and oriented.    HEENT: Normal. Pupils equal. .   Lungs: Clear.  IS = 1,200 cc   Abdomen: Soft, good BS   Wound: LLQ wound full feeling - hematoma   Lab Results:    Recent Labs    01/19/20 0207 01/20/20 0218  WBC 9.1 8.5  HGB 7.1* 6.8*  HCT 23.6* 22.2*  PLT 469* 493*    BMET   Recent Labs    01/18/20 0251  NA 137  K 3.3*  CL 103  CO2 25  GLUCOSE 116*  BUN 10  CREATININE 0.56  CALCIUM 8.5*    PT/INR  No results for input(s): LABPROT, INR in the last 72 hours.  ABG  No results for input(s): PHART, HCO3 in the last 72 hours.  Invalid input(s): PCO2, PO2   Studies/Results:  No results found.   Anti-infectives:   Anti-infectives (From admission, onward)   Start     Dose/Rate Route Frequency Ordered  Stop   01/16/20 0600  vancomycin (VANCOCIN) IVPB 1000 mg/200 mL premix  Status:  Discontinued     1,000 mg 200 mL/hr over 60 Minutes Intravenous On call to O.R. 01/15/20 1607 01/15/20 1816   01/16/20 0600  vancomycin (VANCOREADY) IVPB 1500 mg/300 mL     1,500 mg 150 mL/hr over 120 Minutes Intravenous On call to O.R. 01/15/20 1816 01/16/20 [place], MD, [facility] Surgery Office: [phone] 01/20/2020

## 2020-01-20 NOTE — Progress Notes (Signed)
CRITICAL VALUE ALERT  Critical Value:  hgb 6.8  Date & Time Notied:  01/20/2020, 0277  Provider Notified:  Barry Dienes Orders Received/Actions taken: New orders received

## 2020-01-21 ENCOUNTER — Other Ambulatory Visit: Payer: Self-pay | Admitting: Orthopedic Surgery

## 2020-01-21 LAB — CBC WITH DIFFERENTIAL/PLATELET
Abs Immature Granulocytes: 0.07 10*3/uL (ref 0.00–0.07)
Basophils Absolute: 0.1 10*3/uL (ref 0.0–0.1)
Basophils Relative: 1 %
Eosinophils Absolute: 0.6 10*3/uL — ABNORMAL HIGH (ref 0.0–0.5)
Eosinophils Relative: 6 %
HCT: 26.6 % — ABNORMAL LOW (ref 36.0–46.0)
Hemoglobin: 8.3 g/dL — ABNORMAL LOW (ref 12.0–15.0)
Immature Granulocytes: 1 %
Lymphocytes Relative: 20 %
Lymphs Abs: 1.9 10*3/uL (ref 0.7–4.0)
MCH: 27.3 pg (ref 26.0–34.0)
MCHC: 31.2 g/dL (ref 30.0–36.0)
MCV: 87.5 fL (ref 80.0–100.0)
Monocytes Absolute: 1.1 10*3/uL — ABNORMAL HIGH (ref 0.1–1.0)
Monocytes Relative: 12 %
Neutro Abs: 5.6 10*3/uL (ref 1.7–7.7)
Neutrophils Relative %: 60 %
Platelets: 526 10*3/uL — ABNORMAL HIGH (ref 150–400)
RBC: 3.04 MIL/uL — ABNORMAL LOW (ref 3.87–5.11)
RDW: 16.8 % — ABNORMAL HIGH (ref 11.5–15.5)
WBC: 9.3 10*3/uL (ref 4.0–10.5)
nRBC: 0 % (ref 0.0–0.2)

## 2020-01-21 LAB — BASIC METABOLIC PANEL
Anion gap: 8 (ref 5–15)
BUN: 9 mg/dL (ref 8–23)
CO2: 28 mmol/L (ref 22–32)
Calcium: 8.4 mg/dL — ABNORMAL LOW (ref 8.9–10.3)
Chloride: 103 mmol/L (ref 98–111)
Creatinine, Ser: 0.59 mg/dL (ref 0.44–1.00)
GFR calc Af Amer: >60 mL/min (ref >60)
GFR calc non Af Amer: >60 mL/min (ref >60)
Glucose, Bld: 98 mg/dL (ref 70–99)
Potassium: 3.7 mmol/L (ref 3.5–5.1)
Sodium: 139 mmol/L (ref 135–145)

## 2020-01-21 LAB — TYPE AND SCREEN
ABO/RH(D): A POS
Antibody Screen: NEGATIVE
Unit division: 0

## 2020-01-21 LAB — CBC
HCT: 29.6 % — ABNORMAL LOW (ref 36.0–46.0)
Hemoglobin: 9 g/dL — ABNORMAL LOW (ref 12.0–15.0)
MCH: 26.7 pg (ref 26.0–34.0)
MCHC: 30.4 g/dL (ref 30.0–36.0)
MCV: 87.8 fL (ref 80.0–100.0)
Platelets: 568 10*3/uL — ABNORMAL HIGH (ref 150–400)
RBC: 3.37 MIL/uL — ABNORMAL LOW (ref 3.87–5.11)
RDW: 16.8 % — ABNORMAL HIGH (ref 11.5–15.5)
WBC: 9.4 10*3/uL (ref 4.0–10.5)
nRBC: 0.2 % (ref 0.0–0.2)

## 2020-01-21 LAB — BPAM RBC
Blood Product Expiration Date: 202106202359
ISSUE DATE / TIME: 202106060823
Unit Type and Rh: 6200

## 2020-01-21 MED ORDER — POLYETHYLENE GLYCOL 3350 17 G PO PACK: 17.0000 g | PACK | Freq: Every day | ORAL | 0 refills | Status: DC | PRN

## 2020-01-21 MED ORDER — ONDANSETRON 4 MG PO TBDP: 4.0000 mg | ORAL_TABLET | Freq: Four times a day (QID) | ORAL | 0 refills | Status: DC | PRN

## 2020-01-21 MED ORDER — DOCUSATE SODIUM 100 MG PO CAPS: 100.0000 mg | ORAL_CAPSULE | Freq: Two times a day (BID) | ORAL | 0 refills | Status: DC | PRN

## 2020-01-21 MED ORDER — OXYCODONE HCL 5 MG PO TABS: 5.0000 mg | ORAL_TABLET | Freq: Four times a day (QID) | ORAL | 0 refills | Status: DC | PRN

## 2020-01-21 MED ORDER — BISACODYL 10 MG RE SUPP
10.0000 mg | Freq: Once | RECTAL | Status: AC
  Administered 2020-01-21: 10 mg via RECTAL
  Filled 2020-01-21: qty 1

## 2020-01-21 MED ORDER — ACETAMINOPHEN 500 MG PO TABS: 1000.0000 mg | ORAL_TABLET | Freq: Three times a day (TID) | ORAL | 0 refills | Status: DC | PRN

## 2020-01-21 NOTE — Discharge Summary (Signed)
Patient ID: ROMY IPOCK [id number] [birth year] 69 y.o.  Admit date: 01/15/2020 Discharge date: 01/21/2020  Admitting Diagnosis: SBO secondary to left spigelian hernia  Discharge Diagnosis SBO secondary to left spigelian hernia  Consultants None   H&P: [name] [name] is a 69yo female PMH HTN, HLD, anxiety, hypothyroidism, and recent right TKA 12/17/19 by Dr. Mayer Camel who presented to [facility] earlier today complaining of abdominal pain. States that the pain started around 1300 yesterday. She has never had pain like this before. It is located in her left abdomen. Intermittent and crampy but gradually getting worse. Associated with multiple episodes of nausea and vomiting. Last BM yesterday. No flatus today. Denies fever, chills, CP, SOB, dysuria.  ED workup included CT scan which shows small-bowel obstruction secondary to a small bowel loop within a LEFT spigelian hernia. WBC 8. General surgery asked to see.  Procedures Dr. Kae Heller - Diagnostic laparoscopy, repair of incarcerated spigelian hernia with inlay mesh - 01/16/2020  Hospital Course:  Patient was admitted to general surgery service for left spigelian hernia. The patient was taken to the operating room on 6/18 where she underwent above procedure.  Patient tolerated the procedure well and was transferred back to the floor. NGT was removed and diet was advanced and tolerated. Patient hospital course was complicated by ABL anemia which required transfusion on 6/6. Patient hgb responded appropriately and stabilized. Patient worked with therapies who recommended [place]. On POD 5, the patient was voiding well, having bowel function, tolerating diet, working well with therapies, pain well controlled, vital signs stable, incisions c/d/i and felt stable for discharge home.  Physical Exam: Please see progress note from earlier today  Allergies as of 01/21/2020      Reactions   Ceclor [cefaclor] Hives, Shortness Of Breath   Bactrim  [sulfamethoxazole-trimethoprim] Hives   Gabapentin Swelling      Medication List    STOP taking these medications   celecoxib 200 MG capsule Commonly known as: CeleBREX     TAKE these medications   acetaminophen 500 MG tablet Commonly known as: TYLENOL Take 2 tablets (1,000 mg total) by mouth every 8 (eight) hours as needed for mild pain. What changed: when to take this   ALPRAZolam 0.5 MG tablet Commonly known as: XANAX Take 0.5 mg by mouth daily as needed for anxiety or sleep.   aspirin EC 81 MG tablet Take 1 tablet (81 mg total) by mouth 2 (two) times daily.   buPROPion 300 MG 24 hr tablet Commonly known as: WELLBUTRIN XL Take 300 mg by mouth daily.   docusate sodium 100 MG capsule Commonly known as: COLACE Take 1 capsule (100 mg total) by mouth 2 (two) times daily as needed for mild constipation.   DULoxetine 60 MG capsule Commonly known as: CYMBALTA Take 120 mg by mouth daily.   irbesartan 150 MG tablet Commonly known as: AVAPRO Take 150 mg by mouth daily.   levothyroxine 125 MCG tablet Commonly known as: SYNTHROID Take 125 mcg by mouth daily before breakfast.   lisdexamfetamine 50 MG capsule Commonly known as: VYVANSE Take 50 mg by mouth daily.   omeprazole 20 MG capsule Commonly known as: PRILOSEC Take 20 mg by mouth daily before breakfast.   ondansetron 4 MG disintegrating tablet Commonly known as: ZOFRAN-ODT Take 1 tablet (4 mg total) by mouth every 6 (six) hours as needed for nausea.   OVER THE COUNTER MEDICATION Apply 1 application topically daily as needed (pain). Magnesium topical cream   oxyCODONE 5 MG  immediate release tablet Commonly known as: Oxy IR/ROXICODONE Take 1 tablet (5 mg total) by mouth every 6 (six) hours as needed for breakthrough pain.   polyethylene glycol 17 g packet Commonly known as: MIRALAX / GLYCOLAX Take 17 g by mouth daily as needed.   simvastatin 40 MG tablet Commonly known as: ZOCOR Take 40 mg by mouth  daily.   tiZANidine 2 MG tablet Commonly known as: ZANAFLEX Take 1 tablet (2 mg total) by mouth every 6 (six) hours as needed. What changed: reasons to take this   torsemide 10 MG tablet Commonly known as: DEMADEX Take 10 mg by mouth daily as needed (swelling).        Follow-up Information    Clovis Riley, MD. Go on 02/07/2020.   Specialty: General Surgery Why: Your appointment is 06/24 at 10:20 am Please arrive 30 minutes prior to your appointment to check in and fill out paperwork. Bring photo ID and insurance information. Contact information: [address] [place] [address] [place], [facility] Follow up.   Specialty: Home Health Services Contact information: [place] [address] [phone]           Signed: Alferd Apa, [facility] Surgery 01/21/2020, 3:53 PM Please see Amion for pager number during day hours 7:00am-4:30pm

## 2020-01-21 NOTE — Progress Notes (Signed)
Physical Therapy Treatment Patient Details Name: [name] [name] MRN: [id number] DOB: Jul 07, 1951 Today's Date: 01/21/2020    History of Present Illness Pt is a 69 y.o. F with significant PMH of HTN, anxiety, recent right TKA 12/17/2019 who presents wtih abdominal pain. CT shows SBO secondary to a small bowel loop wtihin a left spigelian hernia. S/p diagnostic laparoscopy and repair of incarcerated spigelian hernia with inlay mesh.    PT Comments    Pt was seen for mobility with home therapy to follow upon DC.  Her efforts were consistently good and did note significant weakness on LLE with efforts to strengthen it.  However, RLE is as well and crutches are helpful but still needs assist.  Follow acutely to continue to strengthen LE's, to work on control of standing balance and to increase/ease her tolerance for standing and moving due to weakness and pain on LE's.     Follow Up Recommendations  Home health PT     Equipment Recommendations  None recommended by PT    Recommendations for Other Services       Precautions / Restrictions Precautions Precautions: Fall Restrictions Weight Bearing Restrictions: No    Mobility  Bed Mobility Overal bed mobility: Needs Assistance Bed Mobility: Rolling;Sidelying to Sit;Sit to Sidelying Rolling: Supervision Sidelying to sit: Supervision     Sit to sidelying: Supervision    Transfers Overall transfer level: Needs assistance Equipment used: Crutches Transfers: Sit to/from Stand Sit to Stand: Min guard         General transfer comment: min guard to control balance in initial standing  Ambulation/Gait Ambulation/Gait assistance: Min guard Gait Distance (Feet): 90 Feet(30 x 3) Assistive device: Crutches Gait Pattern/deviations: Decreased stride length;Step-through pattern;Wide base of support Gait velocity: decreased Gait velocity interpretation: <1.31 ft/sec, indicative of household ambulator General Gait Details: 4 point pattern on  crutches with pt safely navigating with lines and PT   Stairs             Wheelchair Mobility    Modified Rankin (Stroke Patients Only)       Balance Overall balance assessment: Needs assistance Sitting-balance support: Feet supported Sitting balance-Leahy Scale: Good     Standing balance support: Bilateral upper extremity supported;During functional activity Standing balance-Leahy Scale: Fair Standing balance comment: less than fair dynamic balance                            Cognition Arousal/Alertness: Awake/alert Behavior During Therapy: WFL for tasks assessed/performed Overall Cognitive Status: Within Functional Limits for tasks assessed                                        Exercises General Exercises - Lower Extremity Ankle Circles/Pumps: AROM;5 reps Long Arc Quad: Strengthening;10 reps Heel Slides: Strengthening;10 reps Hip ABduction/ADduction: Strengthening;10 reps Hip Flexion/Marching: AROM;10 reps    General Comments General comments (skin integrity, edema, etc.): Pt was up to walk with PT using crutches and demonstrates safe transition from sit to stand and back with minor cues of hands      Pertinent Vitals/Pain Pain Assessment: 0-10 Pain Score: 5  Pain Location: abd, L knee Pain Descriptors / Indicators: Guarding;Grimacing;Operative site guarding Pain Intervention(s): Limited activity within patient's tolerance;Monitored during session;Premedicated before session;Repositioned    Home Living  Prior Function            PT Goals (current goals can now be found in the care plan section) Acute Rehab PT Goals Patient Stated Goal: keep up ex to get knee in shape Progress towards PT goals: Progressing toward goals    Frequency    Min 3X/week      PT Plan Current plan remains appropriate    Co-evaluation              AM-PAC PT "6 Clicks" Mobility   Outcome Measure  Help  needed turning from your back to your side while in a flat bed without using bedrails?: None Help needed moving from lying on your back to sitting on the side of a flat bed without using bedrails?: None Help needed moving to and from a bed to a chair (including a wheelchair)?: A Little Help needed standing up from a chair using your arms (e.g., wheelchair or bedside chair)?: A Little Help needed to walk in hospital room?: A Little Help needed climbing 3-5 steps with a railing? : A Lot 6 Click Score: 19    End of Session Equipment Utilized During Treatment: Gait belt Activity Tolerance: Patient tolerated treatment well Patient left: with call bell/phone within reach;in bed Nurse Communication: Mobility status PT Visit Diagnosis: Unsteadiness on feet (R26.81);Difficulty in walking, not elsewhere classified (R26.2);Pain Pain - Right/Left: Left Pain - part of body: Knee     Time: 9163-8466 PT Time Calculation (min) (ACUTE ONLY): 27 min  Charges:  $Gait Training: 8-22 mins $Therapeutic Exercise: 8-22 mins                    Ramond Dial 01/21/2020, 6:44 PM  Mee Hives, PT MS Acute Rehab Dept. Number: [facility] [phone] and [facility] [phone]'

## 2020-01-21 NOTE — Progress Notes (Signed)
5 Days Post-Op  Subjective: CC: Anxious. Tolerating diet without n/v. No abdominal pain. Passing flatus. Reports small BM on 6/5.  Objective: Vital signs in last 24 hours: Temp:  [97.5 F (36.4 C)-98.4 F (36.9 C)] 97.5 F (36.4 C) (06/07 0315) Pulse Rate:  [81-99] 81 (06/07 0315) Resp:  [16-24] 17 (06/07 0315) BP: (110-117)/(48-58) 112/48 (06/07 0315) SpO2:  [95 %-99 %] 95 % (06/07 0315) Last BM Date: 01/19/20  Intake/Output from previous day: 06/06 0701 - 06/07 0700 In: 1525 [P.O.:960; I.V.:250; Blood:315] Out: 1500 [Urine:1500] Intake/Output this shift: No intake/output data recorded.  PE: Gen: Alert, NAD, pleasant Lungs: Normal rate and effort  Abd: Soft, ND,NT,+BS,incisions c/d/i with steri-strips in place. There is a hematoma at the LLQ wound  Ext:DP 2+ Psych: A&Ox3  Skin: no rashes noted, warm and dry  Lab Results:  Recent Labs    01/20/20 0218 01/20/20 0218 01/20/20 1500 01/21/20 0222  WBC 8.5  --   --  9.3  HGB 6.8*   < > 9.1* 8.3*  HCT 22.2*   < > 30.0* 26.6*  PLT 493*  --   --  526*   < > = values in this interval not displayed.   BMET Recent Labs    01/21/20 0222  NA 139  K 3.7  CL 103  CO2 28  GLUCOSE 98  BUN 9  CREATININE 0.59  CALCIUM 8.4*   PT/INR No results for input(s): LABPROT, INR in the last 72 hours. CMP     Component Value Date/Time   NA 139 01/21/2020 0222   K 3.7 01/21/2020 0222   CL 103 01/21/2020 0222   CO2 28 01/21/2020 0222   GLUCOSE 98 01/21/2020 0222   BUN 9 01/21/2020 0222   CREATININE 0.59 01/21/2020 0222   CALCIUM 8.4 (L) 01/21/2020 0222   PROT 6.8 01/15/2020 1111   ALBUMIN 3.5 01/15/2020 1111   AST 19 01/15/2020 1111   ALT 18 01/15/2020 1111   ALKPHOS 111 01/15/2020 1111   BILITOT 1.1 01/15/2020 1111   GFRNONAA >60 01/21/2020 0222   GFRAA >60 01/21/2020 0222   Lipase     Component Value Date/Time   LIPASE 17 01/15/2020 1111       Studies/Results: No results  found.  Anti-infectives: Anti-infectives (From admission, onward)   Start     Dose/Rate Route Frequency Ordered Stop   01/16/20 0600  vancomycin (VANCOCIN) IVPB 1000 mg/200 mL premix  Status:  Discontinued     1,000 mg 200 mL/hr over 60 Minutes Intravenous On call to O.R. 01/15/20 1607 01/15/20 1816   01/16/20 0600  vancomycin (VANCOREADY) IVPB 1500 mg/300 mL     1,500 mg 150 mL/hr over 120 Minutes Intravenous On call to O.R. 01/15/20 1816 01/16/20 1032       Assessment/Plan 1.  SBO secondary to left spigelian hernia S/pdiagnostic laparoscopy, repair of incarcerated spigelian hernia with inlay mesh- Dr. Kae Heller - 01/16/2020  POD #5             Hematoma in LLQ at wound site, but stable             On soft 2.  Anemia             Post op blood loss             Hgb - 6.8 - 01/20/2020             Dr. Barry Dienes wrote to transfuse 1 unit PRBC  Hgb 6.8 >  9.1 > 8.3. Recheck in PM 3.  HTN 4.  Hypothyroidism - home meds 5.  Anxiety - has Xanax 6.  S/p right TKA 12/17/19 Dr. Mayer Camel 7.  DVT prophylaxis - on hold 8.  Hypokalemia             Resolved  FEN - Soft VTE - SCDs ID - Vanc periop. None currently.   Plan: PM CBC. Possible d/c this afternoon.    LOS: 6 days    Jillyn Ledger , [facility] Surgery 01/21/2020, 8:40 AM Please see Amion for pager number during day hours 7:00am-4:30pm

## 2020-01-23 DIAGNOSIS — F909 Attention-deficit hyperactivity disorder, unspecified type: Secondary | ICD-10-CM | POA: Diagnosis not present

## 2020-01-23 DIAGNOSIS — M1712 Unilateral primary osteoarthritis, left knee: Secondary | ICD-10-CM | POA: Diagnosis not present

## 2020-01-23 DIAGNOSIS — Z96651 Presence of right artificial knee joint: Secondary | ICD-10-CM | POA: Diagnosis not present

## 2020-01-23 DIAGNOSIS — K5730 Diverticulosis of large intestine without perforation or abscess without bleeding: Secondary | ICD-10-CM | POA: Diagnosis not present

## 2020-01-23 DIAGNOSIS — F410 Panic disorder [episodic paroxysmal anxiety] without agoraphobia: Secondary | ICD-10-CM | POA: Diagnosis not present

## 2020-01-23 DIAGNOSIS — Z6841 Body Mass Index (BMI) 40.0 and over, adult: Secondary | ICD-10-CM | POA: Diagnosis not present

## 2020-01-23 DIAGNOSIS — K5900 Constipation, unspecified: Secondary | ICD-10-CM | POA: Diagnosis not present

## 2020-01-23 DIAGNOSIS — E890 Postprocedural hypothyroidism: Secondary | ICD-10-CM | POA: Diagnosis not present

## 2020-01-23 DIAGNOSIS — E785 Hyperlipidemia, unspecified: Secondary | ICD-10-CM | POA: Diagnosis not present

## 2020-01-23 DIAGNOSIS — Z87891 Personal history of nicotine dependence: Secondary | ICD-10-CM | POA: Diagnosis not present

## 2020-01-23 DIAGNOSIS — Z9181 History of falling: Secondary | ICD-10-CM | POA: Diagnosis not present

## 2020-01-23 DIAGNOSIS — K219 Gastro-esophageal reflux disease without esophagitis: Secondary | ICD-10-CM | POA: Diagnosis not present

## 2020-01-23 DIAGNOSIS — N393 Stress incontinence (female) (male): Secondary | ICD-10-CM | POA: Diagnosis not present

## 2020-01-23 DIAGNOSIS — F329 Major depressive disorder, single episode, unspecified: Secondary | ICD-10-CM | POA: Diagnosis not present

## 2020-01-23 DIAGNOSIS — Z8673 Personal history of transient ischemic attack (TIA), and cerebral infarction without residual deficits: Secondary | ICD-10-CM | POA: Diagnosis not present

## 2020-01-23 DIAGNOSIS — M5135 Other intervertebral disc degeneration, thoracolumbar region: Secondary | ICD-10-CM | POA: Diagnosis not present

## 2020-01-23 DIAGNOSIS — E669 Obesity, unspecified: Secondary | ICD-10-CM | POA: Diagnosis not present

## 2020-01-23 DIAGNOSIS — Z7982 Long term (current) use of aspirin: Secondary | ICD-10-CM | POA: Diagnosis not present

## 2020-01-23 DIAGNOSIS — I10 Essential (primary) hypertension: Secondary | ICD-10-CM | POA: Diagnosis not present

## 2020-01-23 DIAGNOSIS — K56609 Unspecified intestinal obstruction, unspecified as to partial versus complete obstruction: Secondary | ICD-10-CM | POA: Diagnosis not present

## 2020-01-23 DIAGNOSIS — Z48815 Encounter for surgical aftercare following surgery on the digestive system: Secondary | ICD-10-CM | POA: Diagnosis not present

## 2020-01-24 DIAGNOSIS — Z9889 Other specified postprocedural states: Secondary | ICD-10-CM | POA: Diagnosis not present

## 2020-01-28 DIAGNOSIS — F4310 Post-traumatic stress disorder, unspecified: Secondary | ICD-10-CM | POA: Diagnosis not present

## 2020-01-30 DIAGNOSIS — M1712 Unilateral primary osteoarthritis, left knee: Secondary | ICD-10-CM | POA: Diagnosis not present

## 2020-01-30 DIAGNOSIS — Z96651 Presence of right artificial knee joint: Secondary | ICD-10-CM | POA: Diagnosis not present

## 2020-01-30 DIAGNOSIS — R269 Unspecified abnormalities of gait and mobility: Secondary | ICD-10-CM | POA: Diagnosis not present

## 2020-01-30 DIAGNOSIS — M6281 Muscle weakness (generalized): Secondary | ICD-10-CM | POA: Diagnosis not present

## 2020-02-05 DIAGNOSIS — M6281 Muscle weakness (generalized): Secondary | ICD-10-CM | POA: Diagnosis not present

## 2020-02-05 DIAGNOSIS — M1712 Unilateral primary osteoarthritis, left knee: Secondary | ICD-10-CM | POA: Diagnosis not present

## 2020-02-05 DIAGNOSIS — R269 Unspecified abnormalities of gait and mobility: Secondary | ICD-10-CM | POA: Diagnosis not present

## 2020-02-05 DIAGNOSIS — Z96651 Presence of right artificial knee joint: Secondary | ICD-10-CM | POA: Diagnosis not present

## 2020-02-20 DIAGNOSIS — Z96651 Presence of right artificial knee joint: Secondary | ICD-10-CM | POA: Diagnosis not present

## 2020-02-20 DIAGNOSIS — M1712 Unilateral primary osteoarthritis, left knee: Secondary | ICD-10-CM | POA: Diagnosis not present

## 2020-02-20 DIAGNOSIS — R269 Unspecified abnormalities of gait and mobility: Secondary | ICD-10-CM | POA: Diagnosis not present

## 2020-02-20 DIAGNOSIS — M6281 Muscle weakness (generalized): Secondary | ICD-10-CM | POA: Diagnosis not present

## 2020-02-21 DIAGNOSIS — M1712 Unilateral primary osteoarthritis, left knee: Secondary | ICD-10-CM | POA: Diagnosis not present

## 2020-02-21 NOTE — Progress Notes (Signed)
PCP - Rboert Barista -   PPM/ICD -  Device Orders -  Rep Notified -   Chest x-ray -12-13-19 epic  EKG - 12-13-19 epic Stress Test - 04-14-18  ECHO -  Cardiac Cath -   Sleep Study -  CPAP -   Fasting Blood Sugar -  Checks Blood Sugar _____ times a day  Blood Thinner Instructions: Aspirin Instructions:  ERAS Protcol - PRE-SURGERY Ensure  COVID TEST-   Activity- can walk a flight of stairs without SOB Anesthesia review: HTN  Patient denies shortness of breath, fever, cough and chest pain at PAT appointment  none   All instructions explained to the patient, with a verbal understanding of the material. Patient agrees to go over the instructions while at home for a better understanding. Patient also instructed to self quarantine after being tested for COVID-19. The opportunity to ask questions was provided.

## 2020-02-21 NOTE — Patient Instructions (Signed)
DUE TO COVID-19 ONLY ONE VISITOR IS ALLOWED TO COME WITH YOU AND STAY IN THE WAITING ROOM ONLY DURING PRE OP AND PROCEDURE DAY OF SURGERY. TWO  VISITOR MAY VISIT WITH YOU AFTER SURGERY IN YOUR PRIVATE ROOM DURING VISITING HOURS ONLY! 10-a-8p  YOU NEED TO HAVE A COVID 19 TEST ON_7-15-21______ @_______ , THIS TEST MUST BE DONE BEFORE SURGERY, COME  [address], [place] , [id number].  ([place]) ONCE YOUR COVID TEST IS COMPLETED, PLEASE BEGIN THE QUARANTINE INSTRUCTIONS AS OUTLINED IN YOUR HANDOUT.                [name] [name]  02/21/2020   Your procedure is scheduled on: 03-03-20    Report to [facility] Main  Entrance   Report to admitting at       0700 AM     Call this number if you have problems the morning of surgery [phone]    Remember: NO SOLID FOOD AFTER MIDNIGHT THE NIGHT PRIOR TO SURGERY. NOTHING BY MOUTH EXCEPT CLEAR LIQUIDS UNTIL      0630 am . PLEASE FINISH ENSURE DRINK PER SURGEON ORDER  WHICH NEEDS TO BE COMPLETED AT          0630 am then nothing by mouth.  CLEAR LIQUID DIET   Foods Allowed                                                                                 Foods Excluded  Coffee and tea, regular and decaf no creamer                             liquids that you cannot  Plain Jell-O any favor except red or purple                                           see through such as: Fruit ices (not with fruit pulp)                                                         milk, soups, orange juice  Iced Popsicles                                                               All solid food Carbonated beverages, regular and diet                                    Cranberry, grape and apple juices Sports drinks like Gatorade Lightly seasoned clear broth or consume(fat free) Sugar, honey syrup  _____________________________________________________________________   Take these medicines the morning of surgery with A SIP OF WATER:  zocor, omeprazole, oxycodone if  Needed, levothyroxine, cymbalta, wellbutrin, xanax if needed                                 You may not have any metal on your body including hair pins and              piercings  Do not wear jewelry, make-up, lotions, powders or perfumes, deodorant             Do not wear nail polish on your fingernails.  Do not shave  48 hours prior to surgery.             Do not bring valuables to the hospital. [place].  Contacts, dentures or bridgework may not be worn into surgery.  Leave suitcase in the car. After surgery it may be brought to your room.     Patients discharged the day of surgery will not be allowed to drive home. IF YOU ARE HAVING SURGERY AND GOING HOME THE SAME DAY, YOU MUST HAVE AN ADULT TO DRIVE YOU HOME AND BE WITH YOU FOR 24 HOURS. YOU MAY GO HOME BY TAXI OR UBER OR ORTHERWISE, BUT AN ADULT MUST ACCOMPANY YOU HOME AND STAY WITH YOU FOR 24 HOURS.  Name and phone number of your driver:  Special Instructions: N/A              Please read over the following fact sheets you were given: _____________________________________________________________________           [facility] - Preparing for Surgery Before surgery, you can play an important role.  Because skin is not sterile, your skin needs to be as free of germs as possible.  You can reduce the number of germs on your skin by washing with CHG (chlorahexidine gluconate) soap before surgery.  CHG is an antiseptic cleaner which kills germs and bonds with the skin to continue killing germs even after washing. Please DO NOT use if you have an allergy to CHG or antibacterial soaps.  If your skin becomes reddened/irritated stop using the CHG and inform your nurse when you arrive at Short Stay. Do not shave (including legs and underarms) for at least 48 hours prior to the first CHG shower.  You may shave your face/neck. Please follow these instructions  carefully:  1.  Shower with CHG Soap the night before surgery and the  morning of Surgery.  2.  If you choose to wash your hair, wash your hair first as usual with your  normal  shampoo.  3.  After you shampoo, rinse your hair and body thoroughly to remove the  shampoo.                           4.  Use CHG as you would any other liquid soap.  You can apply chg directly  to the skin and wash                       Gently with a scrungie or clean washcloth.  5.  Apply the CHG Soap to your body ONLY FROM THE NECK DOWN.   Do not use on face/  open                           Wound or open sores. Avoid contact with eyes, ears mouth and genitals (private parts).                       Wash face,  Genitals (private parts) with your normal soap.             6.  Wash thoroughly, paying special attention to the area where your surgery  will be performed.  7.  Thoroughly rinse your body with warm water from the neck down.  8.  DO NOT shower/wash with your normal soap after using and rinsing off  the CHG Soap.                9.  Pat yourself dry with a clean towel.            10.  Wear clean pajamas.            11.  Place clean sheets on your bed the night of your first shower and do not  sleep with pets. Day of Surgery : Do not apply any lotions/deodorants the morning of surgery.  Please wear clean clothes to the hospital/surgery center.  FAILURE TO FOLLOW THESE INSTRUCTIONS MAY RESULT IN THE CANCELLATION OF YOUR SURGERY PATIENT SIGNATURE_________________________________  NURSE SIGNATURE__________________________________  ________________________________________________________________________   [name] [name]  An incentive spirometer is a tool that can help keep your lungs clear and active. This tool measures how well you are filling your lungs with each breath. Taking long deep breaths may help reverse or decrease the chance of developing breathing (pulmonary) problems (especially infection)  following:  A long period of time when you are unable to move or be active. BEFORE THE PROCEDURE   If the spirometer includes an indicator to show your best effort, your nurse or respiratory therapist will set it to a desired goal.  If possible, sit up straight or lean slightly forward. Try not to slouch.  Hold the incentive spirometer in an upright position. INSTRUCTIONS FOR USE  1. Sit on the edge of your bed if possible, or sit up as far as you can in bed or on a chair. 2. Hold the incentive spirometer in an upright position. 3. Breathe out normally. 4. Place the mouthpiece in your mouth and seal your lips tightly around it. 5. Breathe in slowly and as deeply as possible, raising the piston or the ball toward the top of the column. 6. Hold your breath for 3-5 seconds or for as long as possible. Allow the piston or ball to fall to the bottom of the column. 7. Remove the mouthpiece from your mouth and breathe out normally. 8. Rest for a few seconds and repeat Steps 1 through 7 at least 10 times every 1-2 hours when you are awake. Take your time and take a few normal breaths between deep breaths. 9. The spirometer may include an indicator to show your best effort. Use the indicator as a goal to work toward during each repetition. 10. After each set of 10 deep breaths, practice coughing to be sure your lungs are clear. If you have an incision (the cut made at the time of surgery), support your incision when coughing by placing a pillow or rolled up towels firmly against it. Once you are able to get out of bed, walk around indoors and  cough well. You may stop using the incentive spirometer when instructed by your caregiver.  RISKS AND COMPLICATIONS  Take your time so you do not get dizzy or light-headed.  If you are in pain, you may need to take or ask for pain medication before doing incentive spirometry. It is harder to take a deep breath if you are having pain. AFTER USE  Rest and  breathe slowly and easily.  It can be helpful to keep track of a log of your progress. Your caregiver can provide you with a simple table to help with this. If you are using the spirometer at home, follow these instructions: [place] IF:   You are having difficultly using the spirometer.  You have trouble using the spirometer as often as instructed.  Your pain medication is not giving enough relief while using the spirometer.  You develop fever of 100.5 F (38.1 C) or higher. SEEK IMMEDIATE MEDICAL CARE IF:   You cough up bloody sputum that had not been present before.  You develop fever of 102 F (38.9 C) or greater.  You develop worsening pain at or near the incision site. MAKE SURE YOU:   Understand these instructions.  Will watch your condition.  Will get help right away if you are not doing well or get worse. Document Released: 12/13/2006 Document Revised: 10/25/2011 Document Reviewed: 02/13/2007 [facility] Patient Information 2014 [place], [place].   ________________________________________________________________________

## 2020-02-22 DIAGNOSIS — Z96651 Presence of right artificial knee joint: Secondary | ICD-10-CM | POA: Diagnosis not present

## 2020-02-22 DIAGNOSIS — M1712 Unilateral primary osteoarthritis, left knee: Secondary | ICD-10-CM | POA: Diagnosis not present

## 2020-02-22 DIAGNOSIS — M6281 Muscle weakness (generalized): Secondary | ICD-10-CM | POA: Diagnosis not present

## 2020-02-22 DIAGNOSIS — R269 Unspecified abnormalities of gait and mobility: Secondary | ICD-10-CM | POA: Diagnosis not present

## 2020-02-25 DIAGNOSIS — M1712 Unilateral primary osteoarthritis, left knee: Secondary | ICD-10-CM | POA: Diagnosis not present

## 2020-02-25 DIAGNOSIS — Z96651 Presence of right artificial knee joint: Secondary | ICD-10-CM | POA: Diagnosis not present

## 2020-02-25 DIAGNOSIS — M6281 Muscle weakness (generalized): Secondary | ICD-10-CM | POA: Diagnosis not present

## 2020-02-25 DIAGNOSIS — R269 Unspecified abnormalities of gait and mobility: Secondary | ICD-10-CM | POA: Diagnosis not present

## 2020-02-27 DIAGNOSIS — Z96651 Presence of right artificial knee joint: Secondary | ICD-10-CM | POA: Diagnosis not present

## 2020-02-27 DIAGNOSIS — R269 Unspecified abnormalities of gait and mobility: Secondary | ICD-10-CM | POA: Diagnosis not present

## 2020-02-27 DIAGNOSIS — M1712 Unilateral primary osteoarthritis, left knee: Secondary | ICD-10-CM | POA: Diagnosis not present

## 2020-02-27 DIAGNOSIS — M6281 Muscle weakness (generalized): Secondary | ICD-10-CM | POA: Diagnosis not present

## 2020-02-28 ENCOUNTER — Encounter (HOSPITAL_COMMUNITY)
Admission: RE | Admit: 2020-02-28 | Discharge: 2020-02-28 | Disposition: A | Discharge status: Home / Self Care | Payer: PPO | Source: Ambulatory Visit | Attending: Orthopedic Surgery | Admitting: Orthopedic Surgery

## 2020-02-28 ENCOUNTER — Other Ambulatory Visit: Payer: Self-pay

## 2020-02-28 ENCOUNTER — Encounter (HOSPITAL_COMMUNITY): Payer: Self-pay

## 2020-02-28 ENCOUNTER — Other Ambulatory Visit (HOSPITAL_COMMUNITY)
Admission: RE | Admit: 2020-02-28 | Discharge: 2020-02-28 | Disposition: A | Discharge status: Home / Self Care | Payer: PPO | Source: Ambulatory Visit | Attending: Orthopedic Surgery | Admitting: Orthopedic Surgery

## 2020-02-28 DIAGNOSIS — Z01818 Encounter for other preprocedural examination: Secondary | ICD-10-CM | POA: Insufficient documentation

## 2020-02-28 DIAGNOSIS — Z20822 Contact with and (suspected) exposure to covid-19: Secondary | ICD-10-CM | POA: Diagnosis not present

## 2020-02-28 HISTORY — DX: Anemia, unspecified: D64.9

## 2020-02-28 LAB — CBC WITH DIFFERENTIAL/PLATELET
Abs Immature Granulocytes: 0.01 10*3/uL (ref 0.00–0.07)
Basophils Absolute: 0.1 10*3/uL (ref 0.0–0.1)
Basophils Relative: 1 %
Eosinophils Absolute: 0.2 10*3/uL (ref 0.0–0.5)
Eosinophils Relative: 3 %
HCT: 38 % (ref 36.0–46.0)
Hemoglobin: 11.3 g/dL — ABNORMAL LOW (ref 12.0–15.0)
Immature Granulocytes: 0 %
Lymphocytes Relative: 23 %
Lymphs Abs: 1.5 10*3/uL (ref 0.7–4.0)
MCH: 26.6 pg (ref 26.0–34.0)
MCHC: 29.7 g/dL — ABNORMAL LOW (ref 30.0–36.0)
MCV: 89.4 fL (ref 80.0–100.0)
Monocytes Absolute: 0.7 10*3/uL (ref 0.1–1.0)
Monocytes Relative: 11 %
Neutro Abs: 4.1 10*3/uL (ref 1.7–7.7)
Neutrophils Relative %: 62 %
Platelets: 404 10*3/uL — ABNORMAL HIGH (ref 150–400)
RBC: 4.25 MIL/uL (ref 3.87–5.11)
RDW: 15.9 % — ABNORMAL HIGH (ref 11.5–15.5)
WBC: 6.5 10*3/uL (ref 4.0–10.5)
nRBC: 0 % (ref 0.0–0.2)

## 2020-02-28 LAB — URINALYSIS, ROUTINE W REFLEX MICROSCOPIC
Bilirubin Urine: NEGATIVE
Glucose, UA: NEGATIVE mg/dL
Hgb urine dipstick: NEGATIVE
Ketones, ur: NEGATIVE mg/dL
Nitrite: NEGATIVE
Protein, ur: NEGATIVE mg/dL
Specific Gravity, Urine: 1.011 (ref 1.005–1.030)
pH: 7 (ref 5.0–8.0)

## 2020-02-28 LAB — APTT: aPTT: 29 seconds (ref 24–36)

## 2020-02-28 LAB — BASIC METABOLIC PANEL
Anion gap: 9 (ref 5–15)
BUN: 20 mg/dL (ref 8–23)
CO2: 26 mmol/L (ref 22–32)
Calcium: 9.4 mg/dL (ref 8.9–10.3)
Chloride: 104 mmol/L (ref 98–111)
Creatinine, Ser: 0.73 mg/dL (ref 0.44–1.00)
GFR calc Af Amer: >60 mL/min (ref >60)
GFR calc non Af Amer: >60 mL/min (ref >60)
Glucose, Bld: 93 mg/dL (ref 70–99)
Potassium: 5.1 mmol/L (ref 3.5–5.1)
Sodium: 139 mmol/L (ref 135–145)

## 2020-02-28 LAB — SARS CORONAVIRUS 2 (TAT 6-24 HRS): SARS Coronavirus 2: NEGATIVE

## 2020-02-28 LAB — PROTIME-INR
INR: 1 (ref 0.8–1.2)
Prothrombin Time: 12.8 seconds (ref 11.4–15.2)

## 2020-02-28 LAB — SURGICAL PCR SCREEN
MRSA, PCR: NEGATIVE
Staphylococcus aureus: NEGATIVE

## 2020-02-29 DIAGNOSIS — M1712 Unilateral primary osteoarthritis, left knee: Secondary | ICD-10-CM | POA: Diagnosis present

## 2020-02-29 NOTE — H&P (Signed)
TOTAL KNEE ADMISSION H&P  Patient is being admitted for left total knee arthroplasty.  Subjective:  Chief Complaint:left knee pain.  HPI: [name] [name], 69 y.o. female, has a history of pain and functional disability in the left knee due to arthritis and has failed non-surgical conservative treatments for greater than 12 weeks to includeNSAID's and/or analgesics, corticosteriod injections, viscosupplementation injections, flexibility and strengthening excercises, use of assistive devices, weight reduction as appropriate and activity modification.  Onset of symptoms was gradual, starting 1 years ago with gradually worsening course since that time. The patient noted no past surgery on the left knee(s).  Patient currently rates pain in the left knee(s) at 10 out of 10 with activity. Patient has night pain, worsening of pain with activity and weight bearing, pain that interferes with activities of daily living, pain with passive range of motion, crepitus and joint swelling.  Patient has evidence of subchondral sclerosis, periarticular osteophytes and joint space narrowing by imaging studies.   There is no active infection.  Patient Active Problem List   Diagnosis Date Noted  . Degenerative arthritis of left knee 02/29/2020  . SBO (small bowel obstruction) ([place]) 01/15/2020  . S/P TKR (total knee replacement), right 12/19/2019  . Total knee replacement status, right 12/17/2019  . Osteoarthritis of right knee 12/14/2019  . Obesity, morbid ([place]) 12/16/2016  . Strain of iliopsoas muscle, initial encounter 07/15/2016  . Elevated cholesterol   . Urinary incontinence   . Endometrial polyp   . Primary osteoarthritis of knees, bilateral 05/15/2009  . DERANGEMENT OF ANTERIOR HORN OF LATERAL MENISCUS 05/15/2009  . KNEE PAIN 05/15/2009   Past Medical History:  Diagnosis Date  . ADHD (attention deficit hyperactivity disorder)   . Anemia   . Anxiety   . Arthritis   . Depression   . GERD  (gastroesophageal reflux disease)   . History of panic attacks   . Hyperlipidemia   . Hypertension   . Hypothyroidism, postsurgical   . SBO (small bowel obstruction) ([place]) 01/15/2020  . SUI (stress urinary incontinence, female)   . Wears glasses     Past Surgical History:  Procedure Laterality Date  . D & C HYSTEROSCOPY W/ RESECTION ENDOMETRIAL POLYP  04/15/2009  . D & C HYSTEROSCOPY W/ RESECTOSCOPE AND THERMACHOICE ENDOMETRIAL ABLATION  12/09/2010  . LAPAROSCOPIC ASSISTED VENTRAL HERNIA REPAIR Left 01/16/2020   Procedure: LAPAROSCOPIC ASSISTED HERNIA REPAIR;  Surgeon: Clovis Riley, MD;  Location: [facility];  Service: General;  Laterality: Left;  . PUBOVAGINAL SLING N/A 07/26/2016   Procedure: PUBO-VAGINAL SLING ALTIS SLING;  Surgeon: Carolan Clines, MD;  Location[facility];  Service: Urology;  Laterality: N/A;  . REPAIR SPIGELIAN HERNIA    . RESECTION GIANT CELL TUMOR RIGHT LONG FINGER  09/13/2008  . THYROIDECTOMY  1979    [facility]   non-malignant diseased thyroid  . TOTAL KNEE ARTHROPLASTY Right 12/17/2019   Procedure: RIGHT TOTAL KNEE ARTHROPLASTY;  Surgeon: Frederik Pear, MD;  Location: [facility];  Service: Orthopedics;  Laterality: Right;  . TRIPLE ARTHRODESIS LEFT FOOT  04/20/2007  . VENTRAL HERNIA REPAIR  2003  . WRIST GANGLION EXCISION Right 1975    No current facility-administered medications for this encounter.   Current Outpatient Medications  Medication Sig Dispense Refill Last Dose  . acetaminophen (TYLENOL) 500 MG tablet Take 2 tablets (1,000 mg total) by mouth every 8 (eight) hours as needed for mild pain. 30 tablet 0   . ALPRAZolam (XANAX) 0.5 MG tablet Take 0.5 mg by  mouth daily as needed for anxiety or sleep.      Marland Kitchen buPROPion (WELLBUTRIN XL) 300 MG 24 hr tablet Take 300 mg by mouth daily.   2   . celecoxib (CELEBREX) 200 MG capsule Take 200 mg by mouth 2 (two) times daily.     . DULoxetine (CYMBALTA) 60 MG capsule Take 120 mg by mouth daily.   2   .  irbesartan (AVAPRO) 150 MG tablet Take 150 mg by mouth daily.     Marland Kitchen levothyroxine (SYNTHROID, LEVOTHROID) 125 MCG tablet Take 125 mcg by mouth daily before breakfast.   0   . omeprazole (PRILOSEC) 20 MG capsule Take 20 mg by mouth daily before breakfast.      . OVER THE COUNTER MEDICATION Apply 1 application topically daily as needed (pain). Magnesium topical cream     . oxyCODONE-acetaminophen (PERCOCET/ROXICET) 5-325 MG tablet Take 1 tablet by mouth every 6 (six) hours as needed for severe pain.     . simvastatin (ZOCOR) 40 MG tablet Take 40 mg by mouth daily.   2   . tiZANidine (ZANAFLEX) 2 MG tablet Take 1 tablet (2 mg total) by mouth every 6 (six) hours as needed. (Patient taking differently: Take 2 mg by mouth every 6 (six) hours as needed for muscle spasms. ) 60 tablet 0   . torsemide (DEMADEX) 10 MG tablet Take 10 mg by mouth daily as needed (swelling).      Marland Kitchen aspirin EC 81 MG tablet Take 1 tablet (81 mg total) by mouth 2 (two) times daily. (Patient not taking: Reported on 02/15/2020) 60 tablet 0 Not Taking at Unknown time  . docusate sodium (COLACE) 100 MG capsule Take 1 capsule (100 mg total) by mouth 2 (two) times daily as needed for mild constipation. (Patient not taking: Reported on 02/15/2020) 10 capsule 0 Not Taking at Unknown time  . lisdexamfetamine (VYVANSE) 50 MG capsule Take 50 mg by mouth daily.      . ondansetron (ZOFRAN-ODT) 4 MG disintegrating tablet Take 1 tablet (4 mg total) by mouth every 6 (six) hours as needed for nausea. (Patient not taking: Reported on 02/15/2020) 20 tablet 0 Not Taking at Unknown time  . oxyCODONE (OXY IR/ROXICODONE) 5 MG immediate release tablet Take 1 tablet (5 mg total) by mouth every 6 (six) hours as needed for breakthrough pain. (Patient not taking: Reported on 02/15/2020) 15 tablet 0 Not Taking at Unknown time  . polyethylene glycol (MIRALAX / GLYCOLAX) 17 g packet Take 17 g by mouth daily as needed. (Patient not taking: Reported on 02/15/2020) 14 each 0 Not  Taking at Unknown time   Allergies  Allergen Reactions  . Ceclor [Cefaclor] Hives and Shortness Of Breath  . Bactrim [Sulfamethoxazole-Trimethoprim] Hives  . Gabapentin Swelling    Social History   Tobacco Use  . Smoking status: Former Smoker    Years: 6.00    Types: Cigarettes    Quit date: 09/21/1974    Years since quitting: 45.4  . Smokeless tobacco: Never Used  Substance Use Topics  . Alcohol use: Not Currently    Comment: very rare    Family History  Problem Relation Age of Onset  . Breast cancer Mother   . Uterine cancer Mother   . Heart disease Paternal Grandfather   . Diabetes Maternal Grandfather      Review of Systems  Constitutional: Positive for fatigue.  Eyes: Negative.   Respiratory: Negative.   Cardiovascular: Positive for leg swelling.  HTN  Endocrine: Negative.   Genitourinary:       Poor bladder control  Musculoskeletal: Positive for arthralgias, back pain and gait problem.  Allergic/Immunologic: Negative.   Hematological: Bruises/bleeds easily.  Psychiatric/Behavioral: The patient is nervous/anxious.     Objective:  Physical Exam Constitutional:      Appearance: Normal appearance. She is obese.  HENT:     Head: Normocephalic and atraumatic.     Nose: Nose normal.     Mouth/Throat:     Mouth: Mucous membranes are moist.     Pharynx: Oropharynx is clear.  Eyes:     Pupils: Pupils are equal, round, and reactive to light.  Cardiovascular:     Pulses: Normal pulses.  Pulmonary:     Effort: Pulmonary effort is normal.  Musculoskeletal:        General: Tenderness and deformity present.     Cervical back: Normal range of motion and neck supple.     Comments: The left knee is tender along the lateral joint line.  Ambulates easily using a couple of crutches because of buckling in her left knee.  Skin:    General: Skin is warm and dry.  Neurological:     General: No focal deficit present.     Mental Status: She is alert and oriented to  person, place, and time. Mental status is at baseline.  Psychiatric:        Mood and Affect: Mood normal.        Behavior: Behavior normal.        Thought Content: Thought content normal.        Judgment: Judgment normal.     Vital signs in last 24 hours:    Labs:   Estimated body mass index is 39.23 kg/m as calculated from the following:   Height as of 02/28/20: 5' 3.5" (1.613 m).   Weight as of 02/28/20: 102.1 kg.   Imaging Review Plain radiographs demonstrate ; bilateral AP weightbearing, bilateral Rosenberg, lateral sunrise views of bilateral knees are taken and reviewed in office today.  Patient has end-stage arthritis lateral compartment bone-on-bone bilateral knees   Assessment/Plan:  End stage arthritis, left knee   The patient history, physical examination, clinical judgment of the provider and imaging studies are consistent with end stage degenerative joint disease of the left knee(s) and total knee arthroplasty is deemed medically necessary. The treatment options including medical management, injection therapy arthroscopy and arthroplasty were discussed at length. The risks and benefits of total knee arthroplasty were presented and reviewed. The risks due to aseptic loosening, infection, stiffness, patella tracking problems, thromboembolic complications and other imponderables were discussed. The patient acknowledged the explanation, agreed to proceed with the plan and consent was signed. Patient is being admitted for inpatient treatment for surgery, pain control, PT, OT, prophylactic antibiotics, VTE prophylaxis, progressive ambulation and ADL's and discharge planning. The patient is planning to be discharged home with home health services    Anticipated LOS equal to or greater than 2 midnights due to - Age 19 and older with one or more of the following:  - Obesity  - Expected need for hospital services (PT, OT, Nursing) required for safe  discharge   OR   -  Unanticipated findings during/Post Surgery: Slow post-op progression: GI, pain control, mobility

## 2020-03-02 MED ORDER — TRANEXAMIC ACID 1000 MG/10ML IV SOLN
2000.0000 mg | INTRAVENOUS | Status: DC
  Filled 2020-03-02: qty 20

## 2020-03-02 MED ORDER — BUPIVACAINE LIPOSOME 1.3 % IJ SUSP
20.0000 mL | Freq: Once | INTRAMUSCULAR | Status: DC
  Filled 2020-03-02: qty 20

## 2020-03-02 NOTE — Anesthesia Preprocedure Evaluation (Addendum)
Anesthesia Evaluation  Patient identified by MRN, date of birth, ID band Patient awake    Reviewed: Allergy & Precautions, NPO status , Patient's Chart, lab work & pertinent test results  History of Anesthesia Complications Negative for: history of anesthetic complications  Airway Mallampati: II  TM Distance: >3 FB Neck ROM: Full    Dental  (+) Dental Advisory Given   Pulmonary former smoker,  02/28/2020 SARS coronavirus NEG   breath sounds clear to auscultation       Cardiovascular hypertension, Pt. on medications (-) angina Rhythm:Regular Rate:Normal  '19 Stress myoview: low normal LVF, EF 53%, normal perfusion in stress and rest   Neuro/Psych Anxiety Depression negative neurological ROS     GI/Hepatic Neg liver ROS, GERD  Medicated and Controlled,Recent ex lap for SBO   Endo/Other  Hypothyroidism Morbid obesity  Renal/GU negative Renal ROS     Musculoskeletal   Abdominal (+) + obese,   Peds  Hematology negative hematology ROS (+)   Anesthesia Other Findings   Reproductive/Obstetrics                            Anesthesia Physical Anesthesia Plan  ASA: III  Anesthesia Plan: Spinal   Post-op Pain Management:  Regional for Post-op pain   Induction:   PONV Risk Score and Plan: 2 and Ondansetron  Airway Management Planned: Natural Airway and Simple Face Mask  Additional Equipment: None  Intra-op Plan:   Post-operative Plan:   Informed Consent: I have reviewed the patients History and Physical, chart, labs and discussed the procedure including the risks, benefits and alternatives for the proposed anesthesia with the patient or authorized representative who has indicated his/her understanding and acceptance.     Dental advisory given  Plan Discussed with: CRNA and Surgeon  Anesthesia Plan Comments: (Plan routine monitors, SAB with adductor canal block for post op analgesia )        Anesthesia Quick Evaluation

## 2020-03-03 ENCOUNTER — Ambulatory Visit (HOSPITAL_COMMUNITY): Payer: PPO | Admitting: Anesthesiology

## 2020-03-03 ENCOUNTER — Observation Stay (HOSPITAL_COMMUNITY)
Admission: RE | Admit: 2020-03-03 | Discharge: 2020-03-04 | Disposition: A | Discharge status: Home / Self Care | Payer: PPO | Source: Other Acute Inpatient Hospital | Attending: Orthopedic Surgery | Admitting: Orthopedic Surgery

## 2020-03-03 ENCOUNTER — Other Ambulatory Visit: Payer: Self-pay

## 2020-03-03 ENCOUNTER — Encounter (HOSPITAL_COMMUNITY): Payer: Self-pay | Admitting: Orthopedic Surgery

## 2020-03-03 ENCOUNTER — Encounter (HOSPITAL_COMMUNITY)
Admission: RE | Disposition: A | Discharge status: Home / Self Care | Payer: Self-pay | Source: Other Acute Inpatient Hospital | Attending: Orthopedic Surgery

## 2020-03-03 DIAGNOSIS — F329 Major depressive disorder, single episode, unspecified: Secondary | ICD-10-CM | POA: Insufficient documentation

## 2020-03-03 DIAGNOSIS — Z96652 Presence of left artificial knee joint: Secondary | ICD-10-CM

## 2020-03-03 DIAGNOSIS — Z7982 Long term (current) use of aspirin: Secondary | ICD-10-CM | POA: Diagnosis not present

## 2020-03-03 DIAGNOSIS — F419 Anxiety disorder, unspecified: Secondary | ICD-10-CM | POA: Insufficient documentation

## 2020-03-03 DIAGNOSIS — E039 Hypothyroidism, unspecified: Secondary | ICD-10-CM | POA: Insufficient documentation

## 2020-03-03 DIAGNOSIS — Z79899 Other long term (current) drug therapy: Secondary | ICD-10-CM | POA: Insufficient documentation

## 2020-03-03 DIAGNOSIS — M545 Low back pain: Secondary | ICD-10-CM | POA: Insufficient documentation

## 2020-03-03 DIAGNOSIS — F909 Attention-deficit hyperactivity disorder, unspecified type: Secondary | ICD-10-CM | POA: Diagnosis not present

## 2020-03-03 DIAGNOSIS — K219 Gastro-esophageal reflux disease without esophagitis: Secondary | ICD-10-CM | POA: Insufficient documentation

## 2020-03-03 DIAGNOSIS — G8918 Other acute postprocedural pain: Secondary | ICD-10-CM | POA: Diagnosis not present

## 2020-03-03 DIAGNOSIS — Z87891 Personal history of nicotine dependence: Secondary | ICD-10-CM | POA: Diagnosis not present

## 2020-03-03 DIAGNOSIS — M1712 Unilateral primary osteoarthritis, left knee: Principal | ICD-10-CM | POA: Insufficient documentation

## 2020-03-03 DIAGNOSIS — E785 Hyperlipidemia, unspecified: Secondary | ICD-10-CM | POA: Insufficient documentation

## 2020-03-03 DIAGNOSIS — I10 Essential (primary) hypertension: Secondary | ICD-10-CM | POA: Diagnosis not present

## 2020-03-03 DIAGNOSIS — Z96651 Presence of right artificial knee joint: Secondary | ICD-10-CM | POA: Diagnosis not present

## 2020-03-03 DIAGNOSIS — M25562 Pain in left knee: Secondary | ICD-10-CM | POA: Diagnosis present

## 2020-03-03 HISTORY — PX: TOTAL KNEE ARTHROPLASTY: SHX125

## 2020-03-03 LAB — TYPE AND SCREEN
ABO/RH(D): A POS
Antibody Screen: NEGATIVE

## 2020-03-03 SURGERY — ARTHROPLASTY, KNEE, TOTAL
Anesthesia: Spinal | Site: Knee | Laterality: Left

## 2020-03-03 MED ORDER — 0.9 % SODIUM CHLORIDE (POUR BTL) OPTIME
TOPICAL | Status: DC | PRN
  Administered 2020-03-03: 1000 mL

## 2020-03-03 MED ORDER — PROPOFOL 500 MG/50ML IV EMUL
INTRAVENOUS | Status: AC
  Filled 2020-03-03: qty 50

## 2020-03-03 MED ORDER — BUPIVACAINE LIPOSOME 1.3 % IJ SUSP
INTRAMUSCULAR | Status: DC | PRN
  Administered 2020-03-03: 20 mL

## 2020-03-03 MED ORDER — POVIDONE-IODINE 10 % EX SWAB: 2.0000 "application " | Freq: Once | CUTANEOUS | Status: DC

## 2020-03-03 MED ORDER — CHLORHEXIDINE GLUCONATE 0.12 % MT SOLN
15.0000 mL | Freq: Once | OROMUCOSAL | Status: AC
  Administered 2020-03-03: 15 mL via OROMUCOSAL

## 2020-03-03 MED ORDER — LEVOTHYROXINE SODIUM 125 MCG PO TABS
125.0000 ug | ORAL_TABLET | Freq: Every day | ORAL | Status: DC
  Administered 2020-03-04: 125 ug via ORAL
  Filled 2020-03-03: qty 1

## 2020-03-03 MED ORDER — MIDAZOLAM HCL 2 MG/2ML IJ SOLN: 0.5000 mg | Freq: Once | INTRAMUSCULAR | Status: DC | PRN

## 2020-03-03 MED ORDER — PROMETHAZINE HCL 25 MG/ML IJ SOLN: 6.2500 mg | INTRAMUSCULAR | Status: DC | PRN

## 2020-03-03 MED ORDER — DIPHENHYDRAMINE HCL 12.5 MG/5ML PO ELIX
12.5000 mg | ORAL_SOLUTION | ORAL | Status: DC | PRN
  Administered 2020-03-03: 25 mg via ORAL
  Filled 2020-03-03: qty 10

## 2020-03-03 MED ORDER — PANTOPRAZOLE SODIUM 40 MG PO TBEC: 40.0000 mg | DELAYED_RELEASE_TABLET | Freq: Every day | ORAL | Status: DC

## 2020-03-03 MED ORDER — FLEET ENEMA 7-19 GM/118ML RE ENEM: 1.0000 | ENEMA | Freq: Once | RECTAL | Status: DC | PRN

## 2020-03-03 MED ORDER — ASPIRIN 81 MG PO CHEW
81.0000 mg | CHEWABLE_TABLET | Freq: Two times a day (BID) | ORAL | Status: DC
  Administered 2020-03-03 – 2020-03-04 (×2): 81 mg via ORAL
  Filled 2020-03-03 (×2): qty 1

## 2020-03-03 MED ORDER — BUPIVACAINE HCL 0.25 % IJ SOLN
INTRAMUSCULAR | Status: AC
  Filled 2020-03-03: qty 1

## 2020-03-03 MED ORDER — ASPIRIN EC 81 MG PO TBEC
81.0000 mg | DELAYED_RELEASE_TABLET | Freq: Two times a day (BID) | ORAL | 0 refills | Status: DC
Start: 2020-03-03 — End: 2021-03-05

## 2020-03-03 MED ORDER — METOCLOPRAMIDE HCL 5 MG/ML IJ SOLN: 5.0000 mg | Freq: Three times a day (TID) | INTRAMUSCULAR | Status: DC | PRN

## 2020-03-03 MED ORDER — HYDROMORPHONE HCL 1 MG/ML IJ SOLN: 0.2500 mg | INTRAMUSCULAR | Status: DC | PRN

## 2020-03-03 MED ORDER — WATER FOR IRRIGATION, STERILE IR SOLN
Status: DC | PRN
  Administered 2020-03-03: 2000 mL

## 2020-03-03 MED ORDER — LACTATED RINGERS IV SOLN: INTRAVENOUS | Status: DC

## 2020-03-03 MED ORDER — ORAL CARE MOUTH RINSE: 15.0000 mL | Freq: Once | OROMUCOSAL | Status: AC

## 2020-03-03 MED ORDER — MEPERIDINE HCL 50 MG/ML IJ SOLN: 6.2500 mg | INTRAMUSCULAR | Status: DC | PRN

## 2020-03-03 MED ORDER — ACETAMINOPHEN 325 MG PO TABS
325.0000 mg | ORAL_TABLET | Freq: Four times a day (QID) | ORAL | Status: DC | PRN
  Administered 2020-03-04: 650 mg via ORAL
  Filled 2020-03-03: qty 2

## 2020-03-03 MED ORDER — PHENOL 1.4 % MT LIQD: 1.0000 | OROMUCOSAL | Status: DC | PRN

## 2020-03-03 MED ORDER — DOCUSATE SODIUM 100 MG PO CAPS
100.0000 mg | ORAL_CAPSULE | Freq: Two times a day (BID) | ORAL | Status: DC
  Administered 2020-03-03 – 2020-03-04 (×2): 100 mg via ORAL
  Filled 2020-03-03 (×2): qty 1

## 2020-03-03 MED ORDER — ONDANSETRON 4 MG PO TBDP: 4.0000 mg | ORAL_TABLET | Freq: Four times a day (QID) | ORAL | 0 refills | Status: DC | PRN

## 2020-03-03 MED ORDER — BISACODYL 5 MG PO TBEC: 5.0000 mg | DELAYED_RELEASE_TABLET | Freq: Every day | ORAL | Status: DC | PRN

## 2020-03-03 MED ORDER — OXYCODONE HCL 5 MG/5ML PO SOLN: 5.0000 mg | Freq: Once | ORAL | Status: DC | PRN

## 2020-03-03 MED ORDER — VANCOMYCIN HCL 1500 MG/300ML IV SOLN
1500.0000 mg | INTRAVENOUS | Status: AC
  Administered 2020-03-03: 1500 mg via INTRAVENOUS
  Filled 2020-03-03: qty 300

## 2020-03-03 MED ORDER — DEXAMETHASONE SODIUM PHOSPHATE 10 MG/ML IJ SOLN
INTRAMUSCULAR | Status: AC
  Filled 2020-03-03: qty 1

## 2020-03-03 MED ORDER — PROPOFOL 500 MG/50ML IV EMUL
INTRAVENOUS | Status: DC | PRN
  Administered 2020-03-03: 125 ug/kg/min via INTRAVENOUS

## 2020-03-03 MED ORDER — POLYETHYLENE GLYCOL 3350 17 G PO PACK: 17.0000 g | PACK | Freq: Every day | ORAL | Status: DC | PRN

## 2020-03-03 MED ORDER — PHENYLEPHRINE HCL-NACL 10-0.9 MG/250ML-% IV SOLN
INTRAVENOUS | Status: DC | PRN
Start: 2020-03-03 — End: 2020-03-03
  Administered 2020-03-03: 30 ug/min via INTRAVENOUS

## 2020-03-03 MED ORDER — LISDEXAMFETAMINE DIMESYLATE 50 MG PO CAPS: 50.0000 mg | ORAL_CAPSULE | Freq: Every day | ORAL | Status: DC

## 2020-03-03 MED ORDER — DOCUSATE SODIUM 100 MG PO CAPS: 100.0000 mg | ORAL_CAPSULE | Freq: Two times a day (BID) | ORAL | Status: DC | PRN

## 2020-03-03 MED ORDER — TORSEMIDE 10 MG PO TABS
10.0000 mg | ORAL_TABLET | Freq: Every day | ORAL | Status: DC | PRN
  Filled 2020-03-03: qty 1

## 2020-03-03 MED ORDER — SODIUM CHLORIDE 0.9 % IR SOLN
Status: DC | PRN
  Administered 2020-03-03: 1000 mL

## 2020-03-03 MED ORDER — DULOXETINE HCL 60 MG PO CPEP
120.0000 mg | ORAL_CAPSULE | Freq: Every day | ORAL | Status: DC
  Administered 2020-03-04: 120 mg via ORAL
  Filled 2020-03-03: qty 2

## 2020-03-03 MED ORDER — DEXAMETHASONE SODIUM PHOSPHATE 10 MG/ML IJ SOLN
10.0000 mg | Freq: Once | INTRAMUSCULAR | Status: AC
  Administered 2020-03-04: 10 mg via INTRAVENOUS
  Filled 2020-03-03: qty 1

## 2020-03-03 MED ORDER — HYDROMORPHONE HCL 1 MG/ML IJ SOLN
0.5000 mg | INTRAMUSCULAR | Status: DC | PRN
  Administered 2020-03-03 (×3): 1 mg via INTRAVENOUS
  Filled 2020-03-03 (×3): qty 1

## 2020-03-03 MED ORDER — IRBESARTAN 150 MG PO TABS
150.0000 mg | ORAL_TABLET | Freq: Every day | ORAL | Status: DC
  Administered 2020-03-03 – 2020-03-04 (×2): 150 mg via ORAL
  Filled 2020-03-03 (×2): qty 1

## 2020-03-03 MED ORDER — DEXAMETHASONE SODIUM PHOSPHATE 10 MG/ML IJ SOLN
INTRAMUSCULAR | Status: DC | PRN
  Administered 2020-03-03: 8 mg via INTRAVENOUS

## 2020-03-03 MED ORDER — OXYCODONE HCL 5 MG PO TABS: 5.0000 mg | ORAL_TABLET | Freq: Once | ORAL | Status: DC | PRN

## 2020-03-03 MED ORDER — PROPOFOL 10 MG/ML IV BOLUS
INTRAVENOUS | Status: AC
  Filled 2020-03-03: qty 20

## 2020-03-03 MED ORDER — ROPIVACAINE HCL 7.5 MG/ML IJ SOLN
INTRAMUSCULAR | Status: DC | PRN
  Administered 2020-03-03: 20 mL via PERINEURAL

## 2020-03-03 MED ORDER — OXYCODONE-ACETAMINOPHEN 5-325 MG PO TABS: 1.0000 | ORAL_TABLET | ORAL | 0 refills | Status: DC | PRN

## 2020-03-03 MED ORDER — BUPIVACAINE IN DEXTROSE 0.75-8.25 % IT SOLN
INTRATHECAL | Status: DC | PRN
Start: 2020-03-03 — End: 2020-03-03
  Administered 2020-03-03: 12 mg via INTRATHECAL

## 2020-03-03 MED ORDER — ONDANSETRON HCL 4 MG/2ML IJ SOLN
INTRAMUSCULAR | Status: AC
  Filled 2020-03-03: qty 2

## 2020-03-03 MED ORDER — SIMVASTATIN 40 MG PO TABS
40.0000 mg | ORAL_TABLET | Freq: Every day | ORAL | Status: DC
  Administered 2020-03-04: 40 mg via ORAL
  Filled 2020-03-03: qty 1

## 2020-03-03 MED ORDER — ONDANSETRON HCL 4 MG PO TABS: 4.0000 mg | ORAL_TABLET | Freq: Four times a day (QID) | ORAL | Status: DC | PRN

## 2020-03-03 MED ORDER — PANTOPRAZOLE SODIUM 40 MG PO TBEC
40.0000 mg | DELAYED_RELEASE_TABLET | Freq: Every day | ORAL | Status: DC
  Administered 2020-03-03 – 2020-03-04 (×2): 40 mg via ORAL
  Filled 2020-03-03 (×2): qty 1

## 2020-03-03 MED ORDER — KCL IN DEXTROSE-NACL 20-5-0.45 MEQ/L-%-% IV SOLN
INTRAVENOUS | Status: DC
  Filled 2020-03-03 (×3): qty 1000

## 2020-03-03 MED ORDER — SODIUM CHLORIDE (PF) 0.9 % IJ SOLN
INTRAMUSCULAR | Status: DC | PRN
  Administered 2020-03-03: 10 mL

## 2020-03-03 MED ORDER — OXYCODONE HCL 5 MG PO TABS
5.0000 mg | ORAL_TABLET | ORAL | Status: DC | PRN
  Administered 2020-03-03: 10 mg via ORAL
  Administered 2020-03-04: 5 mg via ORAL
  Administered 2020-03-04: 10 mg via ORAL
  Administered 2020-03-04: 5 mg via ORAL
  Filled 2020-03-03: qty 1
  Filled 2020-03-03 (×3): qty 2

## 2020-03-03 MED ORDER — TRANEXAMIC ACID 1000 MG/10ML IV SOLN
INTRAVENOUS | Status: DC | PRN
  Administered 2020-03-03: 2000 mg via TOPICAL

## 2020-03-03 MED ORDER — BUPROPION HCL ER (XL) 300 MG PO TB24
300.0000 mg | ORAL_TABLET | Freq: Every day | ORAL | Status: DC
  Administered 2020-03-04: 300 mg via ORAL
  Filled 2020-03-03: qty 1

## 2020-03-03 MED ORDER — TIZANIDINE HCL 2 MG PO TABS
2.0000 mg | ORAL_TABLET | Freq: Four times a day (QID) | ORAL | 0 refills | Status: DC | PRN
Start: 2020-03-03 — End: 2021-03-05

## 2020-03-03 MED ORDER — MENTHOL 3 MG MT LOZG: 1.0000 | LOZENGE | OROMUCOSAL | Status: DC | PRN

## 2020-03-03 MED ORDER — PHENYLEPHRINE 40 MCG/ML (10ML) SYRINGE FOR IV PUSH (FOR BLOOD PRESSURE SUPPORT)
PREFILLED_SYRINGE | INTRAVENOUS | Status: AC
  Filled 2020-03-03: qty 10

## 2020-03-03 MED ORDER — PROPOFOL 10 MG/ML IV BOLUS
INTRAVENOUS | Status: DC | PRN
  Administered 2020-03-03 (×2): 10 mg via INTRAVENOUS
  Administered 2020-03-03 (×2): 20 mg via INTRAVENOUS

## 2020-03-03 MED ORDER — SODIUM CHLORIDE (PF) 0.9 % IJ SOLN
INTRAMUSCULAR | Status: AC
  Filled 2020-03-03: qty 50

## 2020-03-03 MED ORDER — ALUM & MAG HYDROXIDE-SIMETH 200-200-20 MG/5ML PO SUSP: 30.0000 mL | ORAL | Status: DC | PRN

## 2020-03-03 MED ORDER — ACETAMINOPHEN 500 MG PO TABS
1000.0000 mg | ORAL_TABLET | Freq: Once | ORAL | Status: AC
  Administered 2020-03-03: 1000 mg via ORAL
  Filled 2020-03-03: qty 2

## 2020-03-03 MED ORDER — PROPOFOL 1000 MG/100ML IV EMUL
INTRAVENOUS | Status: AC
  Filled 2020-03-03: qty 100

## 2020-03-03 MED ORDER — METOCLOPRAMIDE HCL 5 MG PO TABS: 5.0000 mg | ORAL_TABLET | Freq: Three times a day (TID) | ORAL | Status: DC | PRN

## 2020-03-03 MED ORDER — ONDANSETRON HCL 4 MG/2ML IJ SOLN
INTRAMUSCULAR | Status: DC | PRN
  Administered 2020-03-03: 4 mg via INTRAVENOUS

## 2020-03-03 MED ORDER — PHENYLEPHRINE HCL (PRESSORS) 10 MG/ML IV SOLN
INTRAVENOUS | Status: AC
  Filled 2020-03-03: qty 1

## 2020-03-03 MED ORDER — TRANEXAMIC ACID-NACL 1000-0.7 MG/100ML-% IV SOLN
1000.0000 mg | INTRAVENOUS | Status: AC
  Administered 2020-03-03: 1000 mg via INTRAVENOUS
  Filled 2020-03-03: qty 100

## 2020-03-03 MED ORDER — ONDANSETRON HCL 4 MG/2ML IJ SOLN: 4.0000 mg | Freq: Four times a day (QID) | INTRAMUSCULAR | Status: DC | PRN

## 2020-03-03 MED ORDER — PHENYLEPHRINE 40 MCG/ML (10ML) SYRINGE FOR IV PUSH (FOR BLOOD PRESSURE SUPPORT)
PREFILLED_SYRINGE | INTRAVENOUS | Status: DC | PRN
  Administered 2020-03-03 (×2): 120 ug via INTRAVENOUS

## 2020-03-03 MED ORDER — FENTANYL CITRATE (PF) 100 MCG/2ML IJ SOLN
50.0000 ug | INTRAMUSCULAR | Status: DC
  Administered 2020-03-03: 50 ug via INTRAVENOUS
  Filled 2020-03-03: qty 2

## 2020-03-03 MED ORDER — MIDAZOLAM HCL 2 MG/2ML IJ SOLN
1.0000 mg | INTRAMUSCULAR | Status: DC
  Administered 2020-03-03: 1 mg via INTRAVENOUS
  Filled 2020-03-03: qty 2

## 2020-03-03 MED ORDER — ALPRAZOLAM 0.5 MG PO TABS
0.5000 mg | ORAL_TABLET | Freq: Every day | ORAL | Status: DC | PRN
  Administered 2020-03-04: 0.5 mg via ORAL
  Filled 2020-03-03: qty 1

## 2020-03-03 SURGICAL SUPPLY — 58 items
ATTUNE PSFEM LTSZ6 NARCEM KNEE (Femur) ×1 IMPLANT
ATTUNE PSRP INSR SZ6 5 KNEE (Insert) ×1 IMPLANT
BAG DECANTER FOR FLEXI CONT (MISCELLANEOUS) ×2 IMPLANT
BAG SPEC THK2 15X12 ZIP CLS (MISCELLANEOUS) ×1
BAG ZIPLOCK 12X15 (MISCELLANEOUS) ×2 IMPLANT
BASE TIBIAL ROT PLAT SZ 5 KNEE (Knees) IMPLANT
BLADE SAG 18X100X1.27 (BLADE) ×2 IMPLANT
BLADE SAW SGTL 11.0X1.19X90.0M (BLADE) ×2 IMPLANT
BLADE SURG SZ10 CARB STEEL (BLADE) ×4 IMPLANT
BNDG CMPR MED 10X6 ELC LF (GAUZE/BANDAGES/DRESSINGS) ×1
BNDG CMPR MED 15X6 ELC VLCR LF (GAUZE/BANDAGES/DRESSINGS) ×1
BNDG ELASTIC 6X10 VLCR STRL LF (GAUZE/BANDAGES/DRESSINGS) ×2 IMPLANT
BNDG ELASTIC 6X15 VLCR STRL LF (GAUZE/BANDAGES/DRESSINGS) ×1 IMPLANT
BOWL SMART MIX CTS (DISPOSABLE) ×2 IMPLANT
BSPLAT TIB 5 CMNT ROT PLAT STR (Knees) ×1 IMPLANT
CEMENT HV SMART SET (Cement) ×4 IMPLANT
COVER SURGICAL LIGHT HANDLE (MISCELLANEOUS) ×2 IMPLANT
COVER WAND RF STERILE (DRAPES) IMPLANT
CUFF TOURN SGL QUICK 34 (TOURNIQUET CUFF) ×2
CUFF TRNQT CYL 34X4.125X (TOURNIQUET CUFF) ×1 IMPLANT
DECANTER SPIKE VIAL GLASS SM (MISCELLANEOUS) ×6 IMPLANT
DRAPE U-SHAPE 47X51 STRL (DRAPES) ×2 IMPLANT
DRSG AQUACEL AG ADV 3.5X10 (GAUZE/BANDAGES/DRESSINGS) ×2 IMPLANT
DURAPREP 26ML APPLICATOR (WOUND CARE) ×2 IMPLANT
ELECT REM PT RETURN 15FT ADLT (MISCELLANEOUS) ×2 IMPLANT
GLOVE BIO SURGEON STRL SZ7.5 (GLOVE) ×2 IMPLANT
GLOVE BIO SURGEON STRL SZ8.5 (GLOVE) ×2 IMPLANT
GLOVE BIOGEL PI IND STRL 8 (GLOVE) ×1 IMPLANT
GLOVE BIOGEL PI IND STRL 9 (GLOVE) ×1 IMPLANT
GLOVE BIOGEL PI INDICATOR 8 (GLOVE) ×1
GLOVE BIOGEL PI INDICATOR 9 (GLOVE) ×1
GOWN STRL REUS W/TWL XL LVL3 (GOWN DISPOSABLE) ×4 IMPLANT
HANDPIECE INTERPULSE COAX TIP (DISPOSABLE) ×2
HOOD PEEL AWAY FLYTE STAYCOOL (MISCELLANEOUS) ×6 IMPLANT
JET LAVAGE IRRISEPT WOUND (IRRIGATION / IRRIGATOR) ×2
KIT TURNOVER KIT A (KITS) IMPLANT
LAVAGE JET IRRISEPT WOUND (IRRIGATION / IRRIGATOR) IMPLANT
NDL HYPO 21X1.5 SAFETY (NEEDLE) ×2 IMPLANT
NEEDLE HYPO 21X1.5 SAFETY (NEEDLE) ×4 IMPLANT
NS IRRIG 1000ML POUR BTL (IV SOLUTION) ×2 IMPLANT
PACK ICE MAXI GEL EZY WRAP (MISCELLANEOUS) ×2 IMPLANT
PACK TOTAL KNEE CUSTOM (KITS) ×2 IMPLANT
PATELLA MEDIAL ATTUN 35MM KNEE (Knees) ×1 IMPLANT
PENCIL SMOKE EVACUATOR (MISCELLANEOUS) IMPLANT
PIN DRILL FIX HALF THREAD (BIT) ×1 IMPLANT
PIN STEINMAN FIXATION KNEE (PIN) ×1 IMPLANT
PROTECTOR NERVE ULNAR (MISCELLANEOUS) ×2 IMPLANT
SET HNDPC FAN SPRY TIP SCT (DISPOSABLE) ×1 IMPLANT
SUT VIC AB 1 CTX 36 (SUTURE) ×2
SUT VIC AB 1 CTX36XBRD ANBCTR (SUTURE) ×1 IMPLANT
SUT VIC AB 3-0 CT1 27 (SUTURE) ×6
SUT VIC AB 3-0 CT1 TAPERPNT 27 (SUTURE) ×3 IMPLANT
SYR CONTROL 10ML LL (SYRINGE) ×4 IMPLANT
TIBIAL BASE ROT PLAT SZ 5 KNEE (Knees) ×2 IMPLANT
TRAY FOLEY MTR SLVR 16FR STAT (SET/KITS/TRAYS/PACK) ×2 IMPLANT
WATER STERILE IRR 1000ML POUR (IV SOLUTION) ×4 IMPLANT
WRAP KNEE MAXI GEL POST OP (GAUZE/BANDAGES/DRESSINGS) ×1 IMPLANT
YANKAUER SUCT BULB TIP 10FT TU (MISCELLANEOUS) ×2 IMPLANT

## 2020-03-03 NOTE — Evaluation (Signed)
Physical Therapy Evaluation Patient Details Name: [name] [name] MRN: [id number] DOB: 1950/10/30 Today's Date: 03/03/2020   History of Present Illness  Pt is 69 yo female with hx of arthritis who presents for L TKA on 03/03/20.  She had R TKA 12/17/2019 and ventral hernia repair 01/16/20.  Pt additionally with ADHD, anemia, anxiety, HTN, and GERD (see H and P for full hx)  Clinical Impression  Pt is s/p L TKA On POD #0 resulting in the deficits listed below (see PT Problem List). Pt very pleasant and motivated.  She is making good progress for DOS.  She was able to ambulate 120' with RW and min cues.  Educated on exercises to increase quad activation and promote circulation.   Pt will benefit from skilled PT to increase their independence and safety with mobility to allow discharge to the venue listed below.      Follow Up Recommendations Follow surgeon's recommendation for DC plan and follow-up therapies;Supervision/Assistance - 24 hour    Equipment Recommendations  None recommended by PT (has DME)    Recommendations for Other Services       Precautions / Restrictions Precautions Precautions: Fall Restrictions Weight Bearing Restrictions: Yes RLE Weight Bearing: Weight bearing as tolerated      Mobility  Bed Mobility Overal bed mobility: Needs Assistance Bed Mobility: Supine to Sit     Supine to sit: Min assist;HOB elevated     General bed mobility comments: min A for L LE  Transfers Overall transfer level: Needs assistance Equipment used: Rolling walker (2 wheeled) Transfers: Sit to/from Stand Sit to Stand: Min guard;From elevated surface         General transfer comment: cues for safe hand placement and LE management  Ambulation/Gait Ambulation/Gait assistance: Min guard Gait Distance (Feet): 120 Feet Assistive device: Rolling walker (2 wheeled) Gait Pattern/deviations: Step-to pattern;Decreased stride length;Decreased weight shift to left Gait velocity:  decreased   General Gait Details: Cues for sequence and RW proximity  Stairs            Wheelchair Mobility    Modified Rankin (Stroke Patients Only)       Balance Overall balance assessment: Needs assistance Sitting-balance support: No upper extremity supported Sitting balance-Leahy Scale: Good     Standing balance support: Bilateral upper extremity supported Standing balance-Leahy Scale: Poor Standing balance comment: rquired use of RW                             Pertinent Vitals/Pain Pain Assessment: 0-10 Pain Score: 2  Pain Location: L knee Pain Descriptors / Indicators: Sore Pain Intervention(s): Premedicated before session;Monitored during session;Limited activity within patient's tolerance;Ice applied;Utilized relaxation techniques    Home Living Family/patient expects to be discharged to:: Private residence Living Arrangements: Spouse/significant other Available Help at Discharge: Family;Available 24 hours/day Type of Home: House Home Access: Stairs to enter Entrance Stairs-Rails: Can reach both Entrance Stairs-Number of Steps: 2 steps then threshold Home Layout: Two level;Able to live on main level with bedroom/bathroom Home Equipment: Shower seat;Walker - 4 wheels;Crutches;Bedside commode;Walker - 2 wheels;Cane - single point;Adaptive equipment      Prior Function     Gait / Transfers Assistance Needed: Pt performing limited community ambulation due to L knee pain; She was using crutches  ADL's / Homemaking Assistance Needed: Pt had supervision getting in/out shower and assist with lower body dressing.  Otherwise was independent with ADLs.  Comments: Pt had R TKA in 5/21  then hernia repair 6/21 - reports knee doing well now but felt hernia repair slowed her down     Hand Dominance        Extremity/Trunk Assessment   Upper Extremity Assessment Upper Extremity Assessment: Overall WFL for tasks assessed    Lower Extremity  Assessment Lower Extremity Assessment: LLE deficits/detail;RLE deficits/detail RLE Deficits / Details: ROM WFL; MMT 5/5 RLE Sensation: WNL LLE Deficits / Details: ROM: L knee lacks 5 ext, 85 flex, hip and ankle WFL; MMT: ankle 5/5, knee 3/5, hip 3/5 LLE Sensation: WNL    Cervical / Trunk Assessment Cervical / Trunk Assessment: Normal  Communication   Communication: No difficulties  Cognition Arousal/Alertness: Awake/alert Behavior During Therapy: WFL for tasks assessed/performed Overall Cognitive Status: Within Functional Limits for tasks assessed                                        General Comments General comments (skin integrity, edema, etc.): VSS on RA.  Educated on PT role and POC.  Pt educated on quad sets and ankle pumps for current HEP.  Also, pt asking how often should she walk at home - advised short distances (household) every 1-2 waking hours.  Discussed "listening" to her body and if too sore may need to back off the distance.    Exercises General Exercises - Lower Extremity Ankle Circles/Pumps: AROM;Both;10 reps;Supine Quad Sets: AROM;Both;10 reps;Supine Heel Slides: Left;5 reps;Supine;AAROM Hip ABduction/ADduction: AAROM;Left;5 reps;Supine   Assessment/Plan    PT Assessment Patient needs continued PT services  PT Problem List Decreased strength;Decreased mobility;Decreased range of motion;Decreased safety awareness;Decreased knowledge of precautions;Decreased coordination;Decreased activity tolerance;Decreased balance;Decreased knowledge of use of DME;Pain       PT Treatment Interventions DME instruction;Therapeutic activities;Modalities;Gait training;Therapeutic exercise;Patient/family education;Stair training;Balance training;Functional mobility training    PT Goals (Current goals can be found in the Care Plan section)  Acute Rehab PT Goals Patient Stated Goal: return home and [facility] PT Goal  Formulation: With patient/family Time For Goal Achievement: 03/17/20 Potential to Achieve Goals: Good    Frequency 7X/week   Barriers to discharge        Co-evaluation               AM-PAC PT "6 Clicks" Mobility  Outcome Measure Help needed turning from your back to your side while in a flat bed without using bedrails?: A Little Help needed moving from lying on your back to sitting on the side of a flat bed without using bedrails?: A Little Help needed moving to and from a bed to a chair (including a wheelchair)?: None Help needed standing up from a chair using your arms (e.g., wheelchair or bedside chair)?: None Help needed to walk in hospital room?: None Help needed climbing 3-5 steps with a railing? : A Little 6 Click Score: 21    End of Session Equipment Utilized During Treatment: Gait belt Activity Tolerance: Patient tolerated treatment well Patient left: with chair alarm set;in chair;with family/visitor present Nurse Communication: Mobility status PT Visit Diagnosis: Other abnormalities of gait and mobility (R26.89);Muscle weakness (generalized) (M62.81)    Time: 3976-7341 PT Time Calculation (min) (ACUTE ONLY): 33 min   Charges:   PT Evaluation $PT Eval Moderate Complexity: 1 Mod PT Treatments $Therapeutic Exercise: 8-22 mins        Abran Richard, PT Acute Rehab Services Pager [phone] [facility] [phone]    Karlton Lemon 03/03/2020, 4:05 PM

## 2020-03-03 NOTE — Progress Notes (Signed)
Orthopedic Tech Progress Note Patient Details:  [name] [name] 07/01/1951 [id number]  Ortho Devices Type of Ortho Device: Bone foam zero knee Ortho Device/Splint Interventions: Application   Post Interventions Patient Tolerated: Well Instructions Provided: Care of device   [name] 03/03/2020, 11:11 AM

## 2020-03-03 NOTE — Discharge Instructions (Signed)

## 2020-03-03 NOTE — Anesthesia Procedure Notes (Signed)
Procedure Name: MAC Date/Time: 03/03/2020 8:54 AM Performed by: Niel Hummer, CRNA Pre-anesthesia Checklist: Patient identified, Emergency Drugs available, Suction available and Patient being monitored Oxygen Delivery Method: Simple face mask

## 2020-03-03 NOTE — Anesthesia Postprocedure Evaluation (Signed)
Anesthesia Post Note  Patient: [name] [name]  Procedure(s) Performed: LEFT TOTAL KNEE ARTHROPLASTY (Left Knee)     Patient location during evaluation: PACU Anesthesia Type: Spinal Level of consciousness: awake and alert, oriented and patient cooperative Pain management: pain level controlled Vital Signs Assessment: post-procedure vital signs reviewed and stable Respiratory status: spontaneous breathing, nonlabored ventilation and respiratory function stable Cardiovascular status: blood pressure returned to baseline and stable Postop Assessment: spinal receding and no apparent nausea or vomiting Anesthetic complications: no   No complications documented.  Last Vitals:  Vitals:   03/03/20 1204 03/03/20 1259  BP: 130/64 130/73  Pulse: 70 75  Resp: 15 18  Temp: 36.5 C 36.4 C  SpO2: 100% 100%    Last Pain:  Vitals:   03/03/20 1204  TempSrc: Oral  PainSc:                  Phillp Dolores,E. Florance Paolillo

## 2020-03-03 NOTE — Interval H&P Note (Signed)
History and Physical Interval Note:  03/03/2020 6:56 AM  [name] [name]  has presented today for surgery, with the diagnosis of LEFT KNEE OSTEOARTHRITIS.  The various methods of treatment have been discussed with the patient and family. After consideration of risks, benefits and other options for treatment, the patient has consented to  Procedure(s): LEFT TOTAL KNEE ARTHROPLASTY (Left) as a surgical intervention.  The patient's history has been reviewed, patient examined, no change in status, stable for surgery.  I have reviewed the patient's chart and labs.  Questions were answered to the patient's satisfaction.     Kerin Salen

## 2020-03-03 NOTE — Transfer of Care (Signed)
Immediate Anesthesia Transfer of Care Note  Patient: [name] [name]  Procedure(s) Performed: LEFT TOTAL KNEE ARTHROPLASTY (Left Knee)  Patient Location: PACU  Anesthesia Type:Spinal  Level of Consciousness: awake, alert  and oriented  Airway & Oxygen Therapy: Patient Spontanous Breathing and Patient connected to face mask oxygen  Post-op Assessment: Report given to RN and Post -op Vital signs reviewed and stable  Post vital signs: Reviewed and stable  Last Vitals:  Vitals Value Taken Time  BP 115/74 03/03/20 1103  Temp    Pulse 70 03/03/20 1105  Resp 20 03/03/20 1105  SpO2 94 % 03/03/20 1105  Vitals shown include unvalidated device data.  Last Pain:  Vitals:   03/03/20 0822  TempSrc:   PainSc: 0-No pain      Patients Stated Pain Goal: 0 (28/41/32 4401)  Complications: No complications documented.

## 2020-03-03 NOTE — Anesthesia Procedure Notes (Signed)
Anesthesia Regional Block: Adductor canal block   Pre-Anesthetic Checklist: ,, timeout performed, Correct Patient, Correct Site, Correct Laterality, Correct Procedure, Correct Position, site marked, Risks and benefits discussed,  Surgical consent,  Pre-op evaluation,  At surgeon's request and post-op pain management  Laterality: Left and Lower  Prep: chloraprep       Needles:  Injection technique: Single-shot  Needle Type: Echogenic Needle     Needle Length: 9cm  Needle Gauge: 21     Additional Needles:   Procedures:,,,, ultrasound used (permanent image in chart),,,,  Narrative:  Start time: 03/03/2020 8:09 AM End time: 03/03/2020 8:15 AM Injection made incrementally with aspirations every 5 mL.  Performed by: Personally  Anesthesiologist: Annye Asa, MD  Additional Notes: Pt identified in Holding room.  Monitors applied. Working IV access confirmed. Sterile prep L thigh.  #21ga ECHOgenic needle into adductor canal with US guidance.  20cc 0.75% Ropivacaine injected incrementally after negative test dose.  Patient asymptomatic, VSS, no heme aspirated, tolerated well.  Jenita Seashore, MD

## 2020-03-03 NOTE — Plan of Care (Signed)

## 2020-03-03 NOTE — Progress Notes (Signed)
Assisted Dr. Carswell Jackson with left, ultrasound guided, adductor canal block. Side rails up, monitors on throughout procedure. See vital signs in flow sheet. Tolerated Procedure well.  

## 2020-03-03 NOTE — Op Note (Signed)
PATIENT ID:      [name] [name]  MRN:     [id number] DOB/AGE:    10-01-1950 / 69 y.o.       OPERATIVE REPORT   DATE OF PROCEDURE:  03/03/2020      PREOPERATIVE DIAGNOSIS:   LEFT KNEE OSTEOARTHRITIS      Estimated body mass index is 39.23 kg/m as calculated from the following:   Height as of 02/28/20: 5' 3.5" (1.613 m).   Weight as of 02/28/20: 102.1 kg.                                                       POSTOPERATIVE DIAGNOSIS:   Same                                                                  PROCEDURE:  Procedure(s): LEFT TOTAL KNEE ARTHROPLASTY Using DepuyAttune RP implants #6NL Femur, #5Tibia, 5 mm Attune RP bearing, 35 Patella    SURGEON: Kerin Salen  ASSISTANT:   Kerry Hough. [employer]   (Present and scrubbed throughout the case, critical for assistance with exposure, retraction, instrumentation, and closure.)        ANESTHESIA: Spinal, 20cc Exparel, 50cc 0.25% Marcaine EBL: 300 cc FLUID REPLACEMENT: 1600 cc crystaloid TOURNIQUET: DRAINS: None TRANEXAMIC ACID: 1gm IV, 2gm topical COMPLICATIONS:  None         INDICATIONS FOR PROCEDURE: The patient has  LEFT KNEE OSTEOARTHRITIS, Val deformities, XR shows bone on bone arthritis, lateral subluxation of tibia. Patient has failed all conservative measures including anti-inflammatory medicines, narcotics, attempts at exercise and weight loss, cortisone injections and viscosupplementation.  Risks and benefits of surgery have been discussed, questions answered.   DESCRIPTION OF PROCEDURE: The patient identified by armband, received  IV antibiotics, in the holding area at [facility] Patient taken to the operating room, appropriate anesthetic monitors were attached, and Spinal anesthesia was  induced. IV Tranexamic acid was given.Tourniquet applied high to the operative thigh. Lateral post and foot positioner applied to the table, the lower extremity was then prepped and draped in usual sterile fashion from the toes to the  tourniquet. Time-out procedure was performed. Kerry Hough. [facility] PAC, was present and scrubbed throughout the case, critical for assistance with, positioning, exposure, retraction, instrumentation, and closure.The skin and subcutaneous tissue along the incision was injected with 20 cc of a mixture of Exparel and Marcaine solution, using a 20-gauge by 1-1/2 inch needle. We began the operation, with the knee flexed 130 degrees, by making the anterior midline incision starting at handbreadth above the patella going over the patella 1 cm medial to and 4 cm distal to the tibial tubercle. Small bleeders in the skin and the subcutaneous tissue identified and cauterized. Transverse retinaculum was incised and reflected medially and a medial parapatellar arthrotomy was accomplished. the patella was everted and theprepatellar fat pad resected. The superficial medial collateral ligament was then elevated from anterior to posterior along the proximal flare of the tibia and anterior half of the menisci resected. The knee was hyperflexed exposing bone on bone arthritis. Peripheral and notch  osteophytes as well as the cruciate ligaments were then resected. We continued to work our way around posteriorly along the proximal tibia, and externally rotated the tibia subluxing it out from underneath the femur. A McHale PCL retractor was placed through the notch and a lateral Hohmann retractor placed, and we then entered the proximal tibia in line with the Depuy starter drill in line with the axis of the tibia followed by an intramedullary guide rod and 0-degree posterior slope cutting guide. The tibial cutting guide, 4 degree posterior sloped, was pinned into place allowing resection of 6 mm of bone medially and 0 mm of bone laterally. Satisfied with the tibial resection, we then entered the distal femur 2 mm anterior to the PCL origin with the intramedullary guide rod and applied the distal femoral cutting guide set at 9 mm, with 5  degrees of valgus. This was pinned along the epicondylar axis. At this point, the distal femoral cut was accomplished without difficulty. We then sized for a #^ femoral component and pinned the guide in 0 degrees of external rotation. The chamfer cutting guide was pinned into place. The anterior, posterior, and chamfer cuts were accomplished without difficulty followed by the Attune RP box cutting guide and the box cut. We also removed posterior osteophytes from the posterior femoral condyles. The posterior capsule was injected with Exparel solution. The knee was brought into full extension. We checked our extension gap and fit a 5 mm bearing. Distracting in extension with a lamina spreader,  bleeders in the posterior capsule, Posterior medial and posterior lateral gutter were cauterized.  The transexamic acid-soaked sponge was then placed in the gap of the knee in extension. The knee was flexed 30. The posterior patella cut was accomplished with the 9.5 mm Attune cutting guide, sized for a 79mm dome, and the fixation pegs drilled.The knee was then once again hyperflexed exposing the proximal tibia. We sized for a # 5 tibial base plate, applied the smokestack and the conical reamer followed by the the Delta fin keel punch. We then hammered into place the Attune RP trial femoral component, drilled the lugs, inserted a  5 mm trial bearing, trial patellar button, and took the knee through range of motion from 0-130 degrees. Medial and lateral ligamentous stability was checked. No thumb pressure was required for patellar Tracking. The tourniquet was not used. All trial components were removed, mating surfaces irrigated with pulse lavage, and dried with suction and sponges. 10 cc of the Exparel solution was applied to the cancellus bone of the patella distal femur and proximal tibia.  After waiting 30 seconds, the bony surfaces were again, dried with sponges. A double batch of DePuy HV cement was mixed and applied to  all bony metallic mating surfaces except for the posterior condyles of the femur itself. In order, we hammered into place the tibial tray and removed excess cement, the femoral component and removed excess cement. The final Attune RP bearing was inserted, and the knee brought to full extension with compression. The patellar button was clamped into place, and excess cement removed. The knee was held at 30 flexion with compression, while the cement cured. The wound was irrigated out with normal saline solution pulse lavage. The rest of the Exparel was injected into the parapatellar arthrotomy, subcutaneous tissues, and periosteal tissues. The parapatellar arthrotomy was closed with running #1 Vicryl suture. The subcutaneous tissue with 3-0 undyed Vicryl suture, and the skin with running 3-0 SQ vicryl. An Aquacil and Ace wrap  were applied. The patient was taken to recovery room without difficulty.   Kerin Salen 03/03/2020, 7:01 AM

## 2020-03-03 NOTE — Anesthesia Procedure Notes (Signed)
Spinal  Patient location during procedure: OR End time: 03/03/2020 9:05 AM Staffing Performed: anesthesiologist  Anesthesiologist: Annye Asa, MD Preanesthetic Checklist Completed: patient identified, IV checked, site marked, risks and benefits discussed, surgical consent, monitors and equipment checked, pre-op evaluation and timeout performed Spinal Block Patient position: sitting Prep: DuraPrep and site prepped and draped Patient monitoring: blood pressure, continuous pulse ox, cardiac monitor and heart rate Approach: midline Location: L3-4 Injection technique: single-shot Needle Needle type: Pencan and Introducer  Needle gauge: 24 G Needle length: 9 cm Additional Notes Pt identified in Operating room.  Monitors applied. Working IV access confirmed. Sterile prep, drape lumbar spine.  CRNA 1% lido local L 2,3, all attempt os, Lorence Nagengast repeat local L 3,4 and  #24ga Pencan into clear CSF L 3,4.  12mg  0.75% Bupivacaine with dextrose injected with asp CSF beginning and end of injection.  Patient asymptomatic, VSS, no heme aspirated, tolerated well.  Jenita Seashore, MD

## 2020-03-04 DIAGNOSIS — M1712 Unilateral primary osteoarthritis, left knee: Secondary | ICD-10-CM | POA: Diagnosis not present

## 2020-03-04 LAB — BASIC METABOLIC PANEL
Anion gap: 7 (ref 5–15)
BUN: 15 mg/dL (ref 8–23)
CO2: 25 mmol/L (ref 22–32)
Calcium: 9.3 mg/dL (ref 8.9–10.3)
Chloride: 103 mmol/L (ref 98–111)
Creatinine, Ser: 0.57 mg/dL (ref 0.44–1.00)
GFR calc Af Amer: >60 mL/min (ref >60)
GFR calc non Af Amer: >60 mL/min (ref >60)
Glucose, Bld: 142 mg/dL — ABNORMAL HIGH (ref 70–99)
Potassium: 4 mmol/L (ref 3.5–5.1)
Sodium: 135 mmol/L (ref 135–145)

## 2020-03-04 LAB — CBC
HCT: 34.4 % — ABNORMAL LOW (ref 36.0–46.0)
Hemoglobin: 10.1 g/dL — ABNORMAL LOW (ref 12.0–15.0)
MCH: 26.6 pg (ref 26.0–34.0)
MCHC: 29.4 g/dL — ABNORMAL LOW (ref 30.0–36.0)
MCV: 90.8 fL (ref 80.0–100.0)
Platelets: 364 10*3/uL (ref 150–400)
RBC: 3.79 MIL/uL — ABNORMAL LOW (ref 3.87–5.11)
RDW: 15.7 % — ABNORMAL HIGH (ref 11.5–15.5)
WBC: 11.8 10*3/uL — ABNORMAL HIGH (ref 4.0–10.5)
nRBC: 0 % (ref 0.0–0.2)

## 2020-03-04 NOTE — TOC Transition Note (Signed)
Transition of Care [facility]) - CM/SW Discharge Note   Patient Details  Name: [name] [name] MRN: [id number] Date of Birth: 02-05-1951  Transition of Care [facility]) CM/SW Contact:  Lia Hopping, [place] Phone Number: 03/04/2020, 9:46 AM   Clinical Narrative:    Therapy Plan: HHPT through [facility]  Patient and spouse confirm the patient has a RW and 3 in1 No other needs identified.    Final next level of care: Home w Home Health Services Barriers to Discharge: No Barriers Identified   Patient Goals and CMS Choice   CMS Medicare.gov Compare Post Acute Care list provided to:: Patient (Prearranged in the Orthopedic Office) Choice offered to / list presented to : Patient  Discharge Placement                       Discharge Plan and Services                DME Arranged: N/A DME Agency: NA       HH Arranged: PT HH Agency: [facility] (formerly [employer]) Date [place]: 03/04/20 Time [place]: [phone] Representative spoke with at [place]: [facility] Interventions     Readmission Risk Interventions No flowsheet data found.

## 2020-03-04 NOTE — Plan of Care (Signed)
  Problem: Education: Goal: Knowledge of General Education information will improve Description: Including pain rating scale, medication(s)/side effects and non-pharmacologic comfort measures 03/04/2020 1425 by Jacobe Study, Helane Gunther, RN Outcome: Adequate for Discharge 03/04/2020 1003 by Braddock Servellon, Helane Gunther, RN Outcome: Progressing   Problem: Health Behavior/Discharge Planning: Goal: Ability to manage health-related needs will improve 03/04/2020 1425 by Laya Letendre, Helane Gunther, RN Outcome: Adequate for Discharge 03/04/2020 1003 by Keena Heesch, Helane Gunther, RN Outcome: Progressing   Problem: Clinical Measurements: Goal: Ability to maintain clinical measurements within normal limits will improve 03/04/2020 1425 by Jenniefer Salak, Helane Gunther, RN Outcome: Adequate for Discharge 03/04/2020 1003 by Latifah Padin, Helane Gunther, RN Outcome: Progressing Goal: Will remain free from infection 03/04/2020 1425 by Daevon Holdren, Helane Gunther, RN Outcome: Adequate for Discharge 03/04/2020 1003 by Queena Monrreal, Helane Gunther, RN Outcome: Progressing Goal: Diagnostic test results will improve 03/04/2020 1425 by Shakendra Griffeth, Helane Gunther, RN Outcome: Adequate for Discharge 03/04/2020 1003 by Chukwudi Ewen, Helane Gunther, RN Outcome: Progressing Goal: Respiratory complications will improve 03/04/2020 1425 by Makennah Omura, Helane Gunther, RN Outcome: Adequate for Discharge 03/04/2020 1003 by Ashanty Coltrane, Helane Gunther, RN Outcome: Progressing Goal: Cardiovascular complication will be avoided 03/04/2020 1425 by Keneth Borg, Helane Gunther, RN Outcome: Adequate for Discharge 03/04/2020 1003 by Soundra Lampley, Helane Gunther, RN Outcome: Progressing   Problem: Activity: Goal: Risk for activity intolerance will decrease 03/04/2020 1425 by Ysabela Keisler, Helane Gunther, RN Outcome: Adequate for Discharge 03/04/2020 1003 by Kimani Hovis, Helane Gunther, RN Outcome: Progressing   Problem: Nutrition: Goal: Adequate nutrition will be maintained 03/04/2020 1425 by Janya Eveland, Helane Gunther, RN Outcome: Adequate for Discharge 03/04/2020 1003 by Mahima Hottle, Helane Gunther, RN Outcome: Progressing   Problem: Coping: Goal: Level of anxiety will decrease 03/04/2020 1425 by Mackenize Delgadillo, Helane Gunther, RN Outcome: Adequate for Discharge 03/04/2020 1003 by Joye Wesenberg, Helane Gunther, RN Outcome: Progressing   Problem: Elimination: Goal: Will not experience complications related to bowel motility 03/04/2020 1425 by Deetta Perla, RN Outcome: Adequate for Discharge 03/04/2020 1003 by Christalyn Goertz, Helane Gunther, RN Outcome: Progressing Goal: Will not experience complications related to urinary retention 03/04/2020 1425 by Raina Sole, Helane Gunther, RN Outcome: Adequate for Discharge 03/04/2020 1003 by Lashonda Sonneborn, Helane Gunther, RN Outcome: Progressing   Problem: Pain Managment: Goal: General experience of comfort will improve 03/04/2020 1425 by Genelle Economou, Helane Gunther, RN Outcome: Adequate for Discharge 03/04/2020 1003 by Lelania Bia, Helane Gunther, RN Outcome: Progressing   Problem: Safety: Goal: Ability to remain free from injury will improve 03/04/2020 1425 by Ambriana Selway, Helane Gunther, RN Outcome: Adequate for Discharge 03/04/2020 1003 by Nitzia Perren, Helane Gunther, RN Outcome: Progressing   Problem: Skin Integrity: Goal: Risk for impaired skin integrity will decrease 03/04/2020 1425 by Brydon Spahr, Helane Gunther, RN Outcome: Adequate for Discharge 03/04/2020 1003 by Dustina Scoggin, Helane Gunther, RN Outcome: Progressing

## 2020-03-04 NOTE — Discharge Summary (Signed)
Patient ID: [name] [name] MRN: [id number] DOB/AGE: 11-24-1950 69 y.o.  Admit date: 03/03/2020 Discharge date: 03/04/2020  Admission Diagnoses:  Principal Problem:   Degenerative arthritis of left knee Active Problems:   S/P TKR (total knee replacement) using cement, left   Discharge Diagnoses:  Same  Past Medical History:  Diagnosis Date  . ADHD (attention deficit hyperactivity disorder)   . Anemia   . Anxiety   . Arthritis   . Depression   . GERD (gastroesophageal reflux disease)   . History of panic attacks   . Hyperlipidemia   . Hypertension   . Hypothyroidism, postsurgical   . SBO (small bowel obstruction) ([place]) 01/15/2020  . SUI (stress urinary incontinence, female)   . Wears glasses     Surgeries: Procedure(s): LEFT TOTAL KNEE ARTHROPLASTY on 03/03/2020   Consultants:   Discharged Condition: Improved  Hospital Course: [name] is an 69 y.o. female who was admitted 03/03/2020 for operative treatment ofDegenerative arthritis of left knee. Patient has severe unremitting pain that affects sleep, daily activities, and work/hobbies. After pre-op clearance the patient was taken to the operating room on 03/03/2020 and underwent  Procedure(s): LEFT TOTAL KNEE ARTHROPLASTY.    Patient was given perioperative antibiotics:  Anti-infectives (From admission, onward)   Start     Dose/Rate Route Frequency Ordered Stop   03/03/20 0715  vancomycin (VANCOREADY) IVPB 1500 mg/300 mL        1,500 mg 150 mL/hr over 120 Minutes Intravenous On call to O.R. 03/03/20 2751 03/03/20 0942       Patient was given sequential compression devices, early ambulation, and chemoprophylaxis to prevent DVT.  Patient benefited maximally from hospital stay and there were no complications.    Recent vital signs:  Patient Vitals for the past 24 hrs:  BP Temp Temp src Pulse Resp SpO2  03/04/20 0605 103/63 97.9 F (36.6 C) Oral 87 16 95 %  03/04/20 0116 (Abnormal) 104/59 (Abnormal) 97.5 F (36.4  C) Oral 89 17 93 %  03/03/20 2202 (Abnormal) 104/59 (Abnormal) 97.5 F (36.4 C) Oral 87 15 95 %  03/03/20 1804 124/70 97.6 F (36.4 C) no documentation 90 18 95 %  03/03/20 1503 112/65 98.1 F (36.7 C) no documentation 87 no documentation 97 %  03/03/20 1400 124/63 no documentation no documentation 82 16 99 %  03/03/20 1259 130/73 97.6 F (36.4 C) no documentation 75 18 100 %  03/03/20 1204 130/64 97.7 F (36.5 C) Oral 70 15 100 %  03/03/20 1145 126/67 98.1 F (36.7 C) no documentation 69 14 96 %  03/03/20 1135 no documentation no documentation no documentation 68 10 98 %  03/03/20 1130 125/66 no documentation no documentation 71 15 98 %  03/03/20 1115 121/67 no documentation no documentation 72 16 97 %  03/03/20 1103 115/74 97.6 F (36.4 C) no documentation 70 19 100 %  03/03/20 0822 92/66 no documentation no documentation 73 14 100 %  03/03/20 0817 117/61 no documentation no documentation 74 14 100 %  03/03/20 0812 121/74 no documentation no documentation 78 12 99 %     Recent laboratory studies:  Recent Labs    03/04/20 0326  WBC 11.8*  HGB 10.1*  HCT 34.4*  PLT 364  NA 135  K 4.0  CL 103  CO2 25  BUN 15  CREATININE 0.57  GLUCOSE 142*  CALCIUM 9.3     Discharge Medications:   Allergies as of 03/04/2020    Allergen Reactions Comment   Ceclor [  cefaclor] Hives, Shortness Of Breath    Bactrim [sulfamethoxazole-trimethoprim] Hives    Gabapentin Swelling       Medication List    Stop taking these medications   acetaminophen 500 MG tablet Commonly known as: TYLENOL   OVER THE COUNTER MEDICATION   oxyCODONE 5 MG immediate release tablet Commonly known as: Oxy IR/ROXICODONE   polyethylene glycol 17 g packet Commonly known as: MIRALAX / GLYCOLAX     Take these medications   ALPRAZolam 0.5 MG tablet Commonly known as: XANAX Take 0.5 mg by mouth daily as needed for anxiety or sleep.   aspirin EC 81 MG tablet Take 1 tablet (81 mg total) by mouth 2 (two)  times daily.   buPROPion 300 MG 24 hr tablet Commonly known as: WELLBUTRIN XL Take 300 mg by mouth daily.   celecoxib 200 MG capsule Commonly known as: CELEBREX Take 200 mg by mouth 2 (two) times daily.   docusate sodium 100 MG capsule Commonly known as: COLACE Take 1 capsule (100 mg total) by mouth 2 (two) times daily as needed for mild constipation.   DULoxetine 60 MG capsule Commonly known as: CYMBALTA Take 120 mg by mouth daily.   irbesartan 150 MG tablet Commonly known as: AVAPRO Take 150 mg by mouth daily.   levothyroxine 125 MCG tablet Commonly known as: SYNTHROID Take 125 mcg by mouth daily before breakfast.   lisdexamfetamine 50 MG capsule Commonly known as: VYVANSE Take 50 mg by mouth daily.   omeprazole 20 MG capsule Commonly known as: PRILOSEC Take 20 mg by mouth daily before breakfast.   ondansetron 4 MG disintegrating tablet Commonly known as: ZOFRAN-ODT Take 1 tablet (4 mg total) by mouth every 6 (six) hours as needed for nausea.   oxyCODONE-acetaminophen 5-325 MG tablet Commonly known as: PERCOCET/ROXICET Take 1 tablet by mouth every 4 (four) hours as needed for severe pain. What changed: when to take this   simvastatin 40 MG tablet Commonly known as: ZOCOR Take 40 mg by mouth daily.   tiZANidine 2 MG tablet Commonly known as: ZANAFLEX Take 1 tablet (2 mg total) by mouth every 6 (six) hours as needed. What changed: reasons to take this   torsemide 10 MG tablet Commonly known as: DEMADEX Take 10 mg by mouth daily as needed (swelling).        Durable Medical Equipment  (From admission, onward)         Start     Ordered   03/03/20 1211  DME Walker rolling  Once       Question:  Patient needs a walker to treat with the following condition  Answer:  Status post total left knee replacement   03/03/20 1210   03/03/20 1211  DME 3 n 1  Once        03/03/20 1210           Discharge Care Instructions  (From admission, onward)          Start     Ordered   03/04/20 0000  Change dressing       Comments: Change dressing Only if drainage exceeds 40% of window on dressing   03/04/20 0724          Diagnostic Studies: No results found.  Disposition: Discharge disposition: 01-Home or Self Care       Discharge Instructions    Call MD / Call 911   Complete by: As directed    If you experience chest pain or shortness of breath,  CALL 911 and be transported to the hospital emergency room.  If you develope a fever above 101 F, pus (white drainage) or increased drainage or redness at the wound, or calf pain, call your surgeon's office.   Change dressing   Complete by: As directed    Change dressing Only if drainage exceeds 40% of window on dressing   Constipation Prevention   Complete by: As directed    Drink plenty of fluids.  Prune juice may be helpful.  You may use a stool softener, such as Colace (over the counter) 100 mg twice a day.  Use MiraLax (over the counter) for constipation as needed.   Diet - low sodium heart healthy   Complete by: As directed    Increase activity slowly as tolerated   Complete by: As directed        Follow-up Information    Frederik Pear, MD In 2 weeks.   Specialty: Orthopedic Surgery Contact information: [place] [id number] [phone]                Signed: Kerin Salen 03/04/2020, 7:24 AM

## 2020-03-04 NOTE — Progress Notes (Signed)
PATIENT ID: AMREEN RACZKOWSKI  MRN: [id number]  DOB/AGE:  [birth year] / 69 y.o.  1 Day Post-Op Procedure(s) (LRB): LEFT TOTAL KNEE ARTHROPLASTY (Left)    PROGRESS NOTE Subjective: Patient is alert, oriented, no Nausea, no Vomiting, yes passing gas. Taking PO well. Denies SOB, Chest or Calf Pain. Using Incentive Spirometer, PAS in place. Ambulate 120 feet, Patient reports pain as 5/10 .    Objective: Vital signs in last 24 hours: Vitals:   03/03/20 1804 03/03/20 2202 03/04/20 0116 03/04/20 0605  BP: 124/70 (Abnormal) 104/59 (Abnormal) 104/59 103/63  Pulse: 90 87 89 87  Resp: 18 15 17 16   Temp: 97.6 F (36.4 C) (Abnormal) 97.5 F (36.4 C) (Abnormal) 97.5 F (36.4 C) 97.9 F (36.6 C)  TempSrc:  Oral Oral Oral  SpO2: 95% 95% 93% 95%  Weight:      Height:          Intake/Output from previous day: I/O last 3 completed shifts: In: 29 [P.O.:200; I.V.:2300; IV Piggyback:100] Out: 2500 [Urine:2450; Blood:50]   Intake/Output this shift: No intake/output data recorded.   LABORATORY DATA: Recent Labs    03/04/20 0326  WBC 11.8*  HGB 10.1*  HCT 34.4*  PLT 364  NA 135  K 4.0  CL 103  CO2 25  BUN 15  CREATININE 0.57  GLUCOSE 142*  CALCIUM 9.3    Examination: Neurologically intact ABD soft Neurovascular intact Sensation intact distally Intact pulses distally Dorsiflexion/Plantar flexion intact Incision: dressing C/D/I No cellulitis present Compartment soft}  Assessment:   1 Day Post-Op Procedure(s) (LRB): LEFT TOTAL KNEE ARTHROPLASTY (Left) ADDITIONAL DIAGNOSIS: Expected Acute Blood Loss Anemia, Status post mesh hernia repair with hematoma  Patient's anticipated LOS is less than 2 midnights, meeting these requirements: - Younger than 37 - Lives within 1 hour of care - Has a competent adult at home to recover with post-op recover - NO history of  - Chronic pain requiring opiods  - Diabetes  - Coronary Artery Disease  - Heart failure  - Heart attack  -  Stroke  - DVT/VTE  - Cardiac arrhythmia  - Respiratory Failure/COPD  - Renal failure  - Anemia  - Advanced Liver disease       Plan: PT/OT WBAT, AROM and PROM  DVT Prophylaxis:  SCDx72hrs, ASA 81 mg BID x 2 weeks DISCHARGE PLAN: Home, Today if patient passes physical therapy DISCHARGE NEEDS: HHPT, Walker and 3-in-1 comode seat     Kerin Salen 03/04/2020, 7:06 AM Patient ID: [name], female   DOB: Jan 02, 1951, 69 y.o.   MRN: [id number]

## 2020-03-04 NOTE — Plan of Care (Signed)

## 2020-03-04 NOTE — Progress Notes (Signed)
Patient is drowsy but alert at this time. Rates pain 3/10 on the pain scale from 0-10. Call bell within reach. Oxy 5 given this am for morning meds before physcial therapy.

## 2020-03-04 NOTE — Progress Notes (Signed)
Physical Therapy Treatment Patient Details Name: [name] [name] MRN: [id number] DOB: 19-Nov-1950 Today's Date: 03/04/2020    History of Present Illness Pt is 69 yo female with hx of arthritis who presents for L TKA on 03/03/20.  She had R TKA 12/17/2019 and ventral hernia repair 01/16/20.  Pt additionally with ADHD, anemia, anxiety, HTN, and GERD (see H and P for full hx)    PT Comments    Pt groggy and occasionally making odd statements however premedicated prior to session.  Pt ambulated in hallway and performed LE exercises.  Will return to practice steps prior to d/c home.  Follow Up Recommendations  Follow surgeon's recommendation for DC plan and follow-up therapies;Supervision/Assistance - 24 hour     Equipment Recommendations  None recommended by PT    Recommendations for Other Services       Precautions / Restrictions Precautions Precautions: Fall;Knee Restrictions Weight Bearing Restrictions: No    Mobility  Bed Mobility Overal bed mobility: Needs Assistance Bed Mobility: Supine to Sit     Supine to sit: Min assist     General bed mobility comments: min A for L LE  Transfers Overall transfer level: Needs assistance Equipment used: Rolling walker (2 wheeled) Transfers: Sit to/from Stand Sit to Stand: Min guard         General transfer comment: verbal cues for UE and LE positioning  Ambulation/Gait Ambulation/Gait assistance: Min guard Gait Distance (Feet): 140 Feet Assistive device: Rolling walker (2 wheeled) Gait Pattern/deviations: Step-to pattern;Decreased stride length;Decreased weight shift to left Gait velocity: decreased   General Gait Details: verbal cues for sequence, RW positioning, posture   Stairs             Wheelchair Mobility    Modified Rankin (Stroke Patients Only)       Balance                                            Cognition Arousal/Alertness: Awake/alert Behavior During Therapy: WFL for tasks  assessed/performed Overall Cognitive Status: Within Functional Limits for tasks assessed                                        Exercises Total Joint Exercises Ankle Circles/Pumps: AROM;Both;10 reps Quad Sets: AROM;Left;10 reps Heel Slides: AAROM;Left;10 reps Hip ABduction/ADduction: AAROM;Left;10 reps Straight Leg Raises: AAROM;Left;10 reps Knee Flexion: AAROM;Seated;Left;10 reps    General Comments        Pertinent Vitals/Pain Pain Assessment: 0-10 Pain Score: 3  Pain Location: L knee Pain Descriptors / Indicators: Sore;Aching Pain Intervention(s): Repositioned;Premedicated before session;Monitored during session    Home Living                      Prior Function            PT Goals (current goals can now be found in the care plan section) Progress towards PT goals: Progressing toward goals    Frequency    7X/week      PT Plan Current plan remains appropriate    Co-evaluation              AM-PAC PT "6 Clicks" Mobility   Outcome Measure  Help needed turning from your back to your side while in a flat bed without using  bedrails?: A Little Help needed moving from lying on your back to sitting on the side of a flat bed without using bedrails?: A Little Help needed moving to and from a bed to a chair (including a wheelchair)?: A Little Help needed standing up from a chair using your arms (e.g., wheelchair or bedside chair)?: A Little Help needed to walk in hospital room?: A Little Help needed climbing 3-5 steps with a railing? : A Little 6 Click Score: 18    End of Session Equipment Utilized During Treatment: Gait belt Activity Tolerance: Patient tolerated treatment well Patient left: with chair alarm set;in chair;with family/visitor present Nurse Communication: Mobility status PT Visit Diagnosis: Other abnormalities of gait and mobility (R26.89);Muscle weakness (generalized) (M62.81)     Time: 0930-1000 PT Time  Calculation (min) (ACUTE ONLY): 30 min  Charges:  $Gait Training: 8-22 mins $Therapeutic Exercise: 8-22 mins                   Jannette Spanner PT, DPT Acute Rehabilitation Services Pager: [phone] Office: [phone]  Trena Platt 03/04/2020, 12:37 PM

## 2020-03-04 NOTE — Progress Notes (Signed)
Physical Therapy Treatment Patient Details Name: [name] [name] MRN: [id number] DOB: 05/09/1951 Today's Date: 03/04/2020    History of Present Illness Pt is 69 yo female with hx of arthritis who presents for L TKA on 03/03/20.  She had R TKA 12/17/2019 and ventral hernia repair 01/16/20.  Pt additionally with ADHD, anemia, anxiety, HTN, and GERD (see H and P for full hx)    PT Comments    Pt ambulated in hallway and practiced safe stair technique with spouse observing.  Pt with improved cognition this afternoon however more pain (waiting to take pain meds just prior to d/c).  Provided HEP handout.  Pt and spouse had no further questions and pt feels ready for d/c home.    Follow Up Recommendations  Follow surgeons recommendation for DC plan and follow-up therapies;Supervision/Assistance - 24 hour     Equipment Recommendations  None recommended by PT    Recommendations for Other Services       Precautions / Restrictions Precautions Precautions: Fall;Knee Restrictions Weight Bearing Restrictions: No    Mobility  Bed Mobility Overal bed mobility: Needs Assistance Bed Mobility: Supine to Sit     Supine to sit: Min assist     General bed mobility comments: min A for L LE for pain  Transfers Overall transfer level: Needs assistance Equipment used: Rolling walker (2 wheeled) Transfers: Sit to/from Stand Sit to Stand: Min assist         General transfer comment: verbal cues for UE and LE positioning  Ambulation/Gait Ambulation/Gait assistance: Min guard Gait Distance (Feet): 100 Feet Assistive device: Rolling walker (2 wheeled) Gait Pattern/deviations: Step-to pattern;Decreased stride length;Decreased weight shift to left Gait velocity: decreased   General Gait Details: verbal cues for sequence, RW positioning, posture   Stairs Stairs: Yes Stairs assistance: Min guard Stair Management: Step to pattern;Forwards;Two rails Number of Stairs: 2 General stair comments:  verbal cues for sequence and safety; spouse observed   Wheelchair Mobility    Modified Rankin (Stroke Patients Only)       Balance                                            Cognition Arousal/Alertness: Awake/alert Behavior During Therapy: WFL for tasks assessed/performed Overall Cognitive Status: Within Functional Limits for tasks assessed                                        Exercises     General Comments        Pertinent Vitals/Pain Pain Assessment: 0-10 Pain Score: 6  Pain Location: L knee Pain Descriptors / Indicators: Sore;Aching Pain Intervention(s): Repositioned;Monitored during session    Home Living                      Prior Function            PT Goals (current goals can now be found in the care plan section) Progress towards PT goals: Progressing toward goals    Frequency    7X/week      PT Plan Current plan remains appropriate    Co-evaluation              AM-PAC PT "6 Clicks" Mobility   Outcome Measure  Help needed turning from your  back to your side while in a flat bed without using bedrails?: A Little Help needed moving from lying on your back to sitting on the side of a flat bed without using bedrails?: A Little Help needed moving to and from a bed to a chair (including a wheelchair)?: A Little Help needed standing up from a chair using your arms (e.g., wheelchair or bedside chair)?: A Little Help needed to walk in hospital room?: A Little Help needed climbing 3-5 steps with a railing? : A Little 6 Click Score: 18    End of Session Equipment Utilized During Treatment: Gait belt Activity Tolerance: Patient tolerated treatment well Patient left: with chair alarm set;in chair;with family/visitor present Nurse Communication: Mobility status PT Visit Diagnosis: Other abnormalities of gait and mobility (R26.89);Muscle weakness (generalized) (M62.81)     Time: 8628-2417 PT Time  Calculation (min) (ACUTE ONLY): 18 min  Charges:  $Gait Training: 8-22 mins                      Arlyce Dice, DPT Acute Rehabilitation Services Pager: [phone] Office: [phone]  York Ram E 03/04/2020, 4:17 PM

## 2020-03-05 ENCOUNTER — Encounter (HOSPITAL_COMMUNITY): Payer: Self-pay | Admitting: Orthopedic Surgery

## 2020-03-05 DIAGNOSIS — Z471 Aftercare following joint replacement surgery: Secondary | ICD-10-CM | POA: Diagnosis not present

## 2020-03-05 DIAGNOSIS — I10 Essential (primary) hypertension: Secondary | ICD-10-CM | POA: Diagnosis not present

## 2020-03-05 DIAGNOSIS — Z96653 Presence of artificial knee joint, bilateral: Secondary | ICD-10-CM | POA: Diagnosis not present

## 2020-03-05 DIAGNOSIS — D649 Anemia, unspecified: Secondary | ICD-10-CM | POA: Diagnosis not present

## 2020-03-05 DIAGNOSIS — E785 Hyperlipidemia, unspecified: Secondary | ICD-10-CM | POA: Diagnosis not present

## 2020-03-05 DIAGNOSIS — Z87891 Personal history of nicotine dependence: Secondary | ICD-10-CM | POA: Diagnosis not present

## 2020-03-05 DIAGNOSIS — K56609 Unspecified intestinal obstruction, unspecified as to partial versus complete obstruction: Secondary | ICD-10-CM | POA: Diagnosis not present

## 2020-03-05 DIAGNOSIS — K219 Gastro-esophageal reflux disease without esophagitis: Secondary | ICD-10-CM | POA: Diagnosis not present

## 2020-03-05 DIAGNOSIS — E890 Postprocedural hypothyroidism: Secondary | ICD-10-CM | POA: Diagnosis not present

## 2020-03-05 DIAGNOSIS — F909 Attention-deficit hyperactivity disorder, unspecified type: Secondary | ICD-10-CM | POA: Diagnosis not present

## 2020-03-05 DIAGNOSIS — N393 Stress incontinence (female) (male): Secondary | ICD-10-CM | POA: Diagnosis not present

## 2020-03-05 DIAGNOSIS — R32 Unspecified urinary incontinence: Secondary | ICD-10-CM | POA: Diagnosis not present

## 2020-03-05 DIAGNOSIS — F410 Panic disorder [episodic paroxysmal anxiety] without agoraphobia: Secondary | ICD-10-CM | POA: Diagnosis not present

## 2020-03-05 DIAGNOSIS — F419 Anxiety disorder, unspecified: Secondary | ICD-10-CM | POA: Diagnosis not present

## 2020-03-05 DIAGNOSIS — F329 Major depressive disorder, single episode, unspecified: Secondary | ICD-10-CM | POA: Diagnosis not present

## 2020-03-05 DIAGNOSIS — Z7982 Long term (current) use of aspirin: Secondary | ICD-10-CM | POA: Diagnosis not present

## 2020-03-11 DIAGNOSIS — F4310 Post-traumatic stress disorder, unspecified: Secondary | ICD-10-CM | POA: Diagnosis not present

## 2020-03-13 DIAGNOSIS — M8580 Other specified disorders of bone density and structure, unspecified site: Secondary | ICD-10-CM | POA: Diagnosis not present

## 2020-03-13 DIAGNOSIS — G4700 Insomnia, unspecified: Secondary | ICD-10-CM | POA: Diagnosis not present

## 2020-03-13 DIAGNOSIS — E7800 Pure hypercholesterolemia, unspecified: Secondary | ICD-10-CM | POA: Diagnosis not present

## 2020-03-13 DIAGNOSIS — E039 Hypothyroidism, unspecified: Secondary | ICD-10-CM | POA: Diagnosis not present

## 2020-03-13 DIAGNOSIS — Z471 Aftercare following joint replacement surgery: Secondary | ICD-10-CM | POA: Diagnosis not present

## 2020-03-13 DIAGNOSIS — Z96652 Presence of left artificial knee joint: Secondary | ICD-10-CM | POA: Diagnosis not present

## 2020-03-13 DIAGNOSIS — I10 Essential (primary) hypertension: Secondary | ICD-10-CM | POA: Diagnosis not present

## 2020-03-13 DIAGNOSIS — F324 Major depressive disorder, single episode, in partial remission: Secondary | ICD-10-CM | POA: Diagnosis not present

## 2020-03-13 DIAGNOSIS — Z9889 Other specified postprocedural states: Secondary | ICD-10-CM | POA: Diagnosis not present

## 2020-03-13 DIAGNOSIS — F331 Major depressive disorder, recurrent, moderate: Secondary | ICD-10-CM | POA: Diagnosis not present

## 2020-03-18 DIAGNOSIS — M6281 Muscle weakness (generalized): Secondary | ICD-10-CM | POA: Diagnosis not present

## 2020-03-18 DIAGNOSIS — Z96652 Presence of left artificial knee joint: Secondary | ICD-10-CM | POA: Diagnosis not present

## 2020-03-18 DIAGNOSIS — Z471 Aftercare following joint replacement surgery: Secondary | ICD-10-CM | POA: Diagnosis not present

## 2020-03-18 DIAGNOSIS — R262 Difficulty in walking, not elsewhere classified: Secondary | ICD-10-CM | POA: Diagnosis not present

## 2020-03-19 DIAGNOSIS — Z96652 Presence of left artificial knee joint: Secondary | ICD-10-CM | POA: Diagnosis not present

## 2020-03-19 DIAGNOSIS — M6281 Muscle weakness (generalized): Secondary | ICD-10-CM | POA: Diagnosis not present

## 2020-03-19 DIAGNOSIS — R262 Difficulty in walking, not elsewhere classified: Secondary | ICD-10-CM | POA: Diagnosis not present

## 2020-03-19 DIAGNOSIS — Z471 Aftercare following joint replacement surgery: Secondary | ICD-10-CM | POA: Diagnosis not present

## 2020-03-24 DIAGNOSIS — R262 Difficulty in walking, not elsewhere classified: Secondary | ICD-10-CM | POA: Diagnosis not present

## 2020-03-24 DIAGNOSIS — Z471 Aftercare following joint replacement surgery: Secondary | ICD-10-CM | POA: Diagnosis not present

## 2020-03-24 DIAGNOSIS — M6281 Muscle weakness (generalized): Secondary | ICD-10-CM | POA: Diagnosis not present

## 2020-03-24 DIAGNOSIS — Z96652 Presence of left artificial knee joint: Secondary | ICD-10-CM | POA: Diagnosis not present

## 2020-03-26 DIAGNOSIS — M6281 Muscle weakness (generalized): Secondary | ICD-10-CM | POA: Diagnosis not present

## 2020-03-26 DIAGNOSIS — Z96652 Presence of left artificial knee joint: Secondary | ICD-10-CM | POA: Diagnosis not present

## 2020-03-26 DIAGNOSIS — Z471 Aftercare following joint replacement surgery: Secondary | ICD-10-CM | POA: Diagnosis not present

## 2020-03-26 DIAGNOSIS — R262 Difficulty in walking, not elsewhere classified: Secondary | ICD-10-CM | POA: Diagnosis not present

## 2020-03-28 DIAGNOSIS — Z471 Aftercare following joint replacement surgery: Secondary | ICD-10-CM | POA: Diagnosis not present

## 2020-03-28 DIAGNOSIS — R262 Difficulty in walking, not elsewhere classified: Secondary | ICD-10-CM | POA: Diagnosis not present

## 2020-03-28 DIAGNOSIS — Z96652 Presence of left artificial knee joint: Secondary | ICD-10-CM | POA: Diagnosis not present

## 2020-03-28 DIAGNOSIS — M6281 Muscle weakness (generalized): Secondary | ICD-10-CM | POA: Diagnosis not present

## 2020-03-31 DIAGNOSIS — Z471 Aftercare following joint replacement surgery: Secondary | ICD-10-CM | POA: Diagnosis not present

## 2020-03-31 DIAGNOSIS — Z96652 Presence of left artificial knee joint: Secondary | ICD-10-CM | POA: Diagnosis not present

## 2020-03-31 DIAGNOSIS — M6281 Muscle weakness (generalized): Secondary | ICD-10-CM | POA: Diagnosis not present

## 2020-03-31 DIAGNOSIS — R262 Difficulty in walking, not elsewhere classified: Secondary | ICD-10-CM | POA: Diagnosis not present

## 2020-04-01 DIAGNOSIS — F4310 Post-traumatic stress disorder, unspecified: Secondary | ICD-10-CM | POA: Diagnosis not present

## 2020-04-02 DIAGNOSIS — R32 Unspecified urinary incontinence: Secondary | ICD-10-CM | POA: Diagnosis not present

## 2020-04-02 DIAGNOSIS — R262 Difficulty in walking, not elsewhere classified: Secondary | ICD-10-CM | POA: Diagnosis not present

## 2020-04-02 DIAGNOSIS — I10 Essential (primary) hypertension: Secondary | ICD-10-CM | POA: Diagnosis not present

## 2020-04-02 DIAGNOSIS — F419 Anxiety disorder, unspecified: Secondary | ICD-10-CM | POA: Diagnosis not present

## 2020-04-02 DIAGNOSIS — Z96652 Presence of left artificial knee joint: Secondary | ICD-10-CM | POA: Diagnosis not present

## 2020-04-02 DIAGNOSIS — F324 Major depressive disorder, single episode, in partial remission: Secondary | ICD-10-CM | POA: Diagnosis not present

## 2020-04-02 DIAGNOSIS — M6281 Muscle weakness (generalized): Secondary | ICD-10-CM | POA: Diagnosis not present

## 2020-04-02 DIAGNOSIS — Z471 Aftercare following joint replacement surgery: Secondary | ICD-10-CM | POA: Diagnosis not present

## 2020-04-08 DIAGNOSIS — Z96652 Presence of left artificial knee joint: Secondary | ICD-10-CM | POA: Diagnosis not present

## 2020-04-08 DIAGNOSIS — M6281 Muscle weakness (generalized): Secondary | ICD-10-CM | POA: Diagnosis not present

## 2020-04-08 DIAGNOSIS — R262 Difficulty in walking, not elsewhere classified: Secondary | ICD-10-CM | POA: Diagnosis not present

## 2020-04-08 DIAGNOSIS — Z471 Aftercare following joint replacement surgery: Secondary | ICD-10-CM | POA: Diagnosis not present

## 2020-04-10 DIAGNOSIS — Z471 Aftercare following joint replacement surgery: Secondary | ICD-10-CM | POA: Diagnosis not present

## 2020-04-10 DIAGNOSIS — Z96652 Presence of left artificial knee joint: Secondary | ICD-10-CM | POA: Diagnosis not present

## 2020-04-10 DIAGNOSIS — M6281 Muscle weakness (generalized): Secondary | ICD-10-CM | POA: Diagnosis not present

## 2020-04-10 DIAGNOSIS — R262 Difficulty in walking, not elsewhere classified: Secondary | ICD-10-CM | POA: Diagnosis not present

## 2020-04-14 DIAGNOSIS — M6281 Muscle weakness (generalized): Secondary | ICD-10-CM | POA: Diagnosis not present

## 2020-04-14 DIAGNOSIS — Z471 Aftercare following joint replacement surgery: Secondary | ICD-10-CM | POA: Diagnosis not present

## 2020-04-14 DIAGNOSIS — Z96652 Presence of left artificial knee joint: Secondary | ICD-10-CM | POA: Diagnosis not present

## 2020-04-14 DIAGNOSIS — R262 Difficulty in walking, not elsewhere classified: Secondary | ICD-10-CM | POA: Diagnosis not present

## 2020-04-15 DIAGNOSIS — M8580 Other specified disorders of bone density and structure, unspecified site: Secondary | ICD-10-CM | POA: Diagnosis not present

## 2020-04-15 DIAGNOSIS — I10 Essential (primary) hypertension: Secondary | ICD-10-CM | POA: Diagnosis not present

## 2020-04-15 DIAGNOSIS — E039 Hypothyroidism, unspecified: Secondary | ICD-10-CM | POA: Diagnosis not present

## 2020-04-15 DIAGNOSIS — F324 Major depressive disorder, single episode, in partial remission: Secondary | ICD-10-CM | POA: Diagnosis not present

## 2020-04-15 DIAGNOSIS — F331 Major depressive disorder, recurrent, moderate: Secondary | ICD-10-CM | POA: Diagnosis not present

## 2020-04-15 DIAGNOSIS — K219 Gastro-esophageal reflux disease without esophagitis: Secondary | ICD-10-CM | POA: Diagnosis not present

## 2020-04-15 DIAGNOSIS — E7800 Pure hypercholesterolemia, unspecified: Secondary | ICD-10-CM | POA: Diagnosis not present

## 2020-04-15 DIAGNOSIS — G4700 Insomnia, unspecified: Secondary | ICD-10-CM | POA: Diagnosis not present

## 2020-04-16 DIAGNOSIS — Z471 Aftercare following joint replacement surgery: Secondary | ICD-10-CM | POA: Diagnosis not present

## 2020-04-16 DIAGNOSIS — R262 Difficulty in walking, not elsewhere classified: Secondary | ICD-10-CM | POA: Diagnosis not present

## 2020-04-16 DIAGNOSIS — Z96652 Presence of left artificial knee joint: Secondary | ICD-10-CM | POA: Diagnosis not present

## 2020-04-16 DIAGNOSIS — M6281 Muscle weakness (generalized): Secondary | ICD-10-CM | POA: Diagnosis not present

## 2020-04-17 DIAGNOSIS — F4310 Post-traumatic stress disorder, unspecified: Secondary | ICD-10-CM | POA: Diagnosis not present

## 2020-04-29 DIAGNOSIS — Z96652 Presence of left artificial knee joint: Secondary | ICD-10-CM | POA: Diagnosis not present

## 2020-04-29 DIAGNOSIS — Z471 Aftercare following joint replacement surgery: Secondary | ICD-10-CM | POA: Diagnosis not present

## 2020-04-29 DIAGNOSIS — M6281 Muscle weakness (generalized): Secondary | ICD-10-CM | POA: Diagnosis not present

## 2020-04-29 DIAGNOSIS — R262 Difficulty in walking, not elsewhere classified: Secondary | ICD-10-CM | POA: Diagnosis not present

## 2020-05-01 DIAGNOSIS — M6281 Muscle weakness (generalized): Secondary | ICD-10-CM | POA: Diagnosis not present

## 2020-05-01 DIAGNOSIS — Z96652 Presence of left artificial knee joint: Secondary | ICD-10-CM | POA: Diagnosis not present

## 2020-05-01 DIAGNOSIS — Z471 Aftercare following joint replacement surgery: Secondary | ICD-10-CM | POA: Diagnosis not present

## 2020-05-01 DIAGNOSIS — R262 Difficulty in walking, not elsewhere classified: Secondary | ICD-10-CM | POA: Diagnosis not present

## 2020-05-05 DIAGNOSIS — F324 Major depressive disorder, single episode, in partial remission: Secondary | ICD-10-CM | POA: Diagnosis not present

## 2020-05-05 DIAGNOSIS — Z23 Encounter for immunization: Secondary | ICD-10-CM | POA: Diagnosis not present

## 2020-05-05 DIAGNOSIS — Z1231 Encounter for screening mammogram for malignant neoplasm of breast: Secondary | ICD-10-CM | POA: Diagnosis not present

## 2020-05-05 DIAGNOSIS — I10 Essential (primary) hypertension: Secondary | ICD-10-CM | POA: Diagnosis not present

## 2020-05-06 ENCOUNTER — Other Ambulatory Visit: Payer: Self-pay | Admitting: Family Medicine

## 2020-05-06 DIAGNOSIS — Z96652 Presence of left artificial knee joint: Secondary | ICD-10-CM | POA: Diagnosis not present

## 2020-05-06 DIAGNOSIS — Z471 Aftercare following joint replacement surgery: Secondary | ICD-10-CM | POA: Diagnosis not present

## 2020-05-06 DIAGNOSIS — M6281 Muscle weakness (generalized): Secondary | ICD-10-CM | POA: Diagnosis not present

## 2020-05-06 DIAGNOSIS — Z1231 Encounter for screening mammogram for malignant neoplasm of breast: Secondary | ICD-10-CM

## 2020-05-06 DIAGNOSIS — R262 Difficulty in walking, not elsewhere classified: Secondary | ICD-10-CM | POA: Diagnosis not present

## 2020-05-07 DIAGNOSIS — F4310 Post-traumatic stress disorder, unspecified: Secondary | ICD-10-CM | POA: Diagnosis not present

## 2020-05-08 DIAGNOSIS — M6281 Muscle weakness (generalized): Secondary | ICD-10-CM | POA: Diagnosis not present

## 2020-05-08 DIAGNOSIS — R262 Difficulty in walking, not elsewhere classified: Secondary | ICD-10-CM | POA: Diagnosis not present

## 2020-05-08 DIAGNOSIS — Z471 Aftercare following joint replacement surgery: Secondary | ICD-10-CM | POA: Diagnosis not present

## 2020-05-08 DIAGNOSIS — Z96652 Presence of left artificial knee joint: Secondary | ICD-10-CM | POA: Diagnosis not present

## 2020-05-13 ENCOUNTER — Other Ambulatory Visit: Payer: Self-pay | Admitting: Family Medicine

## 2020-05-13 DIAGNOSIS — G4700 Insomnia, unspecified: Secondary | ICD-10-CM | POA: Diagnosis not present

## 2020-05-13 DIAGNOSIS — Z1231 Encounter for screening mammogram for malignant neoplasm of breast: Secondary | ICD-10-CM

## 2020-05-13 DIAGNOSIS — R262 Difficulty in walking, not elsewhere classified: Secondary | ICD-10-CM | POA: Diagnosis not present

## 2020-05-13 DIAGNOSIS — E7800 Pure hypercholesterolemia, unspecified: Secondary | ICD-10-CM | POA: Diagnosis not present

## 2020-05-13 DIAGNOSIS — Z96652 Presence of left artificial knee joint: Secondary | ICD-10-CM | POA: Diagnosis not present

## 2020-05-13 DIAGNOSIS — I10 Essential (primary) hypertension: Secondary | ICD-10-CM | POA: Diagnosis not present

## 2020-05-13 DIAGNOSIS — K219 Gastro-esophageal reflux disease without esophagitis: Secondary | ICD-10-CM | POA: Diagnosis not present

## 2020-05-13 DIAGNOSIS — F324 Major depressive disorder, single episode, in partial remission: Secondary | ICD-10-CM | POA: Diagnosis not present

## 2020-05-13 DIAGNOSIS — E039 Hypothyroidism, unspecified: Secondary | ICD-10-CM | POA: Diagnosis not present

## 2020-05-13 DIAGNOSIS — M6281 Muscle weakness (generalized): Secondary | ICD-10-CM | POA: Diagnosis not present

## 2020-05-13 DIAGNOSIS — M8580 Other specified disorders of bone density and structure, unspecified site: Secondary | ICD-10-CM | POA: Diagnosis not present

## 2020-05-13 DIAGNOSIS — F331 Major depressive disorder, recurrent, moderate: Secondary | ICD-10-CM | POA: Diagnosis not present

## 2020-05-13 DIAGNOSIS — Z471 Aftercare following joint replacement surgery: Secondary | ICD-10-CM | POA: Diagnosis not present

## 2020-05-15 DIAGNOSIS — Z9889 Other specified postprocedural states: Secondary | ICD-10-CM | POA: Diagnosis not present

## 2020-05-15 DIAGNOSIS — Z96652 Presence of left artificial knee joint: Secondary | ICD-10-CM | POA: Diagnosis not present

## 2020-05-15 DIAGNOSIS — R351 Nocturia: Secondary | ICD-10-CM | POA: Diagnosis not present

## 2020-05-15 DIAGNOSIS — R350 Frequency of micturition: Secondary | ICD-10-CM | POA: Diagnosis not present

## 2020-05-15 DIAGNOSIS — N3946 Mixed incontinence: Secondary | ICD-10-CM | POA: Diagnosis not present

## 2020-05-15 DIAGNOSIS — Z96651 Presence of right artificial knee joint: Secondary | ICD-10-CM | POA: Diagnosis not present

## 2020-05-15 DIAGNOSIS — Z471 Aftercare following joint replacement surgery: Secondary | ICD-10-CM | POA: Diagnosis not present

## 2020-05-23 DIAGNOSIS — M67912 Unspecified disorder of synovium and tendon, left shoulder: Secondary | ICD-10-CM | POA: Diagnosis not present

## 2020-05-23 DIAGNOSIS — M67911 Unspecified disorder of synovium and tendon, right shoulder: Secondary | ICD-10-CM | POA: Diagnosis not present

## 2020-05-29 DIAGNOSIS — F4310 Post-traumatic stress disorder, unspecified: Secondary | ICD-10-CM | POA: Diagnosis not present

## 2020-06-10 DIAGNOSIS — F4310 Post-traumatic stress disorder, unspecified: Secondary | ICD-10-CM | POA: Diagnosis not present

## 2020-06-13 DIAGNOSIS — F324 Major depressive disorder, single episode, in partial remission: Secondary | ICD-10-CM | POA: Diagnosis not present

## 2020-06-13 DIAGNOSIS — G4700 Insomnia, unspecified: Secondary | ICD-10-CM | POA: Diagnosis not present

## 2020-06-13 DIAGNOSIS — K219 Gastro-esophageal reflux disease without esophagitis: Secondary | ICD-10-CM | POA: Diagnosis not present

## 2020-06-13 DIAGNOSIS — M8580 Other specified disorders of bone density and structure, unspecified site: Secondary | ICD-10-CM | POA: Diagnosis not present

## 2020-06-13 DIAGNOSIS — F331 Major depressive disorder, recurrent, moderate: Secondary | ICD-10-CM | POA: Diagnosis not present

## 2020-06-13 DIAGNOSIS — I10 Essential (primary) hypertension: Secondary | ICD-10-CM | POA: Diagnosis not present

## 2020-06-13 DIAGNOSIS — E039 Hypothyroidism, unspecified: Secondary | ICD-10-CM | POA: Diagnosis not present

## 2020-06-13 DIAGNOSIS — E7800 Pure hypercholesterolemia, unspecified: Secondary | ICD-10-CM | POA: Diagnosis not present

## 2020-06-16 ENCOUNTER — Ambulatory Visit
Admission: RE | Admit: 2020-06-16 | Discharge: 2020-06-16 | Disposition: A | Discharge status: Home / Self Care | Payer: PPO | Source: Ambulatory Visit | Attending: Family Medicine | Admitting: Family Medicine

## 2020-06-16 ENCOUNTER — Other Ambulatory Visit: Payer: Self-pay

## 2020-06-16 DIAGNOSIS — Z1231 Encounter for screening mammogram for malignant neoplasm of breast: Secondary | ICD-10-CM | POA: Diagnosis not present

## 2020-06-24 DIAGNOSIS — R609 Edema, unspecified: Secondary | ICD-10-CM | POA: Diagnosis not present

## 2020-06-24 DIAGNOSIS — I10 Essential (primary) hypertension: Secondary | ICD-10-CM | POA: Diagnosis not present

## 2020-06-24 DIAGNOSIS — E039 Hypothyroidism, unspecified: Secondary | ICD-10-CM | POA: Diagnosis not present

## 2020-06-24 DIAGNOSIS — R32 Unspecified urinary incontinence: Secondary | ICD-10-CM | POA: Diagnosis not present

## 2020-06-24 DIAGNOSIS — M8580 Other specified disorders of bone density and structure, unspecified site: Secondary | ICD-10-CM | POA: Diagnosis not present

## 2020-06-24 DIAGNOSIS — R7303 Prediabetes: Secondary | ICD-10-CM | POA: Diagnosis not present

## 2020-06-24 DIAGNOSIS — F419 Anxiety disorder, unspecified: Secondary | ICD-10-CM | POA: Diagnosis not present

## 2020-06-24 DIAGNOSIS — E7800 Pure hypercholesterolemia, unspecified: Secondary | ICD-10-CM | POA: Diagnosis not present

## 2020-06-24 DIAGNOSIS — K219 Gastro-esophageal reflux disease without esophagitis: Secondary | ICD-10-CM | POA: Diagnosis not present

## 2020-06-24 DIAGNOSIS — F900 Attention-deficit hyperactivity disorder, predominantly inattentive type: Secondary | ICD-10-CM | POA: Diagnosis not present

## 2020-06-24 DIAGNOSIS — G4700 Insomnia, unspecified: Secondary | ICD-10-CM | POA: Diagnosis not present

## 2020-06-25 DIAGNOSIS — M67911 Unspecified disorder of synovium and tendon, right shoulder: Secondary | ICD-10-CM | POA: Diagnosis not present

## 2020-06-25 DIAGNOSIS — M75122 Complete rotator cuff tear or rupture of left shoulder, not specified as traumatic: Secondary | ICD-10-CM | POA: Diagnosis not present

## 2020-06-25 DIAGNOSIS — M67912 Unspecified disorder of synovium and tendon, left shoulder: Secondary | ICD-10-CM | POA: Diagnosis not present

## 2020-06-25 DIAGNOSIS — M25511 Pain in right shoulder: Secondary | ICD-10-CM | POA: Diagnosis not present

## 2020-06-26 DIAGNOSIS — F4310 Post-traumatic stress disorder, unspecified: Secondary | ICD-10-CM | POA: Diagnosis not present

## 2020-07-04 DIAGNOSIS — R531 Weakness: Secondary | ICD-10-CM | POA: Diagnosis not present

## 2020-07-04 DIAGNOSIS — M25312 Other instability, left shoulder: Secondary | ICD-10-CM | POA: Diagnosis not present

## 2020-07-04 DIAGNOSIS — D649 Anemia, unspecified: Secondary | ICD-10-CM | POA: Diagnosis not present

## 2020-07-04 DIAGNOSIS — R351 Nocturia: Secondary | ICD-10-CM | POA: Diagnosis not present

## 2020-07-04 DIAGNOSIS — R350 Frequency of micturition: Secondary | ICD-10-CM | POA: Diagnosis not present

## 2020-07-04 DIAGNOSIS — M25511 Pain in right shoulder: Secondary | ICD-10-CM | POA: Diagnosis not present

## 2020-07-04 DIAGNOSIS — N3946 Mixed incontinence: Secondary | ICD-10-CM | POA: Diagnosis not present

## 2020-07-04 DIAGNOSIS — M25512 Pain in left shoulder: Secondary | ICD-10-CM | POA: Diagnosis not present

## 2020-07-07 DIAGNOSIS — E7800 Pure hypercholesterolemia, unspecified: Secondary | ICD-10-CM | POA: Diagnosis not present

## 2020-07-07 DIAGNOSIS — K219 Gastro-esophageal reflux disease without esophagitis: Secondary | ICD-10-CM | POA: Diagnosis not present

## 2020-07-07 DIAGNOSIS — I10 Essential (primary) hypertension: Secondary | ICD-10-CM | POA: Diagnosis not present

## 2020-07-07 DIAGNOSIS — E039 Hypothyroidism, unspecified: Secondary | ICD-10-CM | POA: Diagnosis not present

## 2020-07-07 DIAGNOSIS — M8580 Other specified disorders of bone density and structure, unspecified site: Secondary | ICD-10-CM | POA: Diagnosis not present

## 2020-07-07 DIAGNOSIS — G4700 Insomnia, unspecified: Secondary | ICD-10-CM | POA: Diagnosis not present

## 2020-07-07 DIAGNOSIS — F324 Major depressive disorder, single episode, in partial remission: Secondary | ICD-10-CM | POA: Diagnosis not present

## 2020-07-07 DIAGNOSIS — F331 Major depressive disorder, recurrent, moderate: Secondary | ICD-10-CM | POA: Diagnosis not present

## 2020-07-09 DIAGNOSIS — M25511 Pain in right shoulder: Secondary | ICD-10-CM | POA: Diagnosis not present

## 2020-07-09 DIAGNOSIS — M67912 Unspecified disorder of synovium and tendon, left shoulder: Secondary | ICD-10-CM | POA: Diagnosis not present

## 2020-07-09 DIAGNOSIS — M67911 Unspecified disorder of synovium and tendon, right shoulder: Secondary | ICD-10-CM | POA: Diagnosis not present

## 2020-07-09 DIAGNOSIS — M75122 Complete rotator cuff tear or rupture of left shoulder, not specified as traumatic: Secondary | ICD-10-CM | POA: Diagnosis not present

## 2020-07-09 DIAGNOSIS — F4310 Post-traumatic stress disorder, unspecified: Secondary | ICD-10-CM | POA: Diagnosis not present

## 2020-07-16 DIAGNOSIS — N3946 Mixed incontinence: Secondary | ICD-10-CM | POA: Diagnosis not present

## 2020-07-16 DIAGNOSIS — R350 Frequency of micturition: Secondary | ICD-10-CM | POA: Diagnosis not present

## 2020-08-04 DIAGNOSIS — E039 Hypothyroidism, unspecified: Secondary | ICD-10-CM | POA: Diagnosis not present

## 2020-08-04 DIAGNOSIS — I10 Essential (primary) hypertension: Secondary | ICD-10-CM | POA: Diagnosis not present

## 2020-08-04 DIAGNOSIS — E7800 Pure hypercholesterolemia, unspecified: Secondary | ICD-10-CM | POA: Diagnosis not present

## 2020-08-04 DIAGNOSIS — K219 Gastro-esophageal reflux disease without esophagitis: Secondary | ICD-10-CM | POA: Diagnosis not present

## 2020-08-04 DIAGNOSIS — N3281 Overactive bladder: Secondary | ICD-10-CM | POA: Diagnosis not present

## 2020-08-04 DIAGNOSIS — F324 Major depressive disorder, single episode, in partial remission: Secondary | ICD-10-CM | POA: Diagnosis not present

## 2020-08-05 DIAGNOSIS — M94212 Chondromalacia, left shoulder: Secondary | ICD-10-CM | POA: Diagnosis not present

## 2020-08-05 DIAGNOSIS — M75112 Incomplete rotator cuff tear or rupture of left shoulder, not specified as traumatic: Secondary | ICD-10-CM | POA: Diagnosis not present

## 2020-08-05 DIAGNOSIS — M24112 Other articular cartilage disorders, left shoulder: Secondary | ICD-10-CM | POA: Diagnosis not present

## 2020-08-05 DIAGNOSIS — M75122 Complete rotator cuff tear or rupture of left shoulder, not specified as traumatic: Secondary | ICD-10-CM | POA: Diagnosis not present

## 2020-08-05 DIAGNOSIS — M94262 Chondromalacia, left knee: Secondary | ICD-10-CM | POA: Diagnosis not present

## 2020-08-05 DIAGNOSIS — G8918 Other acute postprocedural pain: Secondary | ICD-10-CM | POA: Diagnosis not present

## 2020-08-05 DIAGNOSIS — M7542 Impingement syndrome of left shoulder: Secondary | ICD-10-CM | POA: Diagnosis not present

## 2020-08-12 DIAGNOSIS — F331 Major depressive disorder, recurrent, moderate: Secondary | ICD-10-CM | POA: Diagnosis not present

## 2020-08-12 DIAGNOSIS — I10 Essential (primary) hypertension: Secondary | ICD-10-CM | POA: Diagnosis not present

## 2020-08-12 DIAGNOSIS — F324 Major depressive disorder, single episode, in partial remission: Secondary | ICD-10-CM | POA: Diagnosis not present

## 2020-08-12 DIAGNOSIS — E7800 Pure hypercholesterolemia, unspecified: Secondary | ICD-10-CM | POA: Diagnosis not present

## 2020-08-12 DIAGNOSIS — M8580 Other specified disorders of bone density and structure, unspecified site: Secondary | ICD-10-CM | POA: Diagnosis not present

## 2020-08-12 DIAGNOSIS — E039 Hypothyroidism, unspecified: Secondary | ICD-10-CM | POA: Diagnosis not present

## 2020-08-12 DIAGNOSIS — G4700 Insomnia, unspecified: Secondary | ICD-10-CM | POA: Diagnosis not present

## 2020-08-12 DIAGNOSIS — K219 Gastro-esophageal reflux disease without esophagitis: Secondary | ICD-10-CM | POA: Diagnosis not present

## 2020-08-13 DIAGNOSIS — Z9889 Other specified postprocedural states: Secondary | ICD-10-CM | POA: Diagnosis not present

## 2020-08-18 DIAGNOSIS — F4310 Post-traumatic stress disorder, unspecified: Secondary | ICD-10-CM | POA: Diagnosis not present

## 2020-08-21 DIAGNOSIS — Z9889 Other specified postprocedural states: Secondary | ICD-10-CM | POA: Diagnosis not present

## 2020-08-21 DIAGNOSIS — M6281 Muscle weakness (generalized): Secondary | ICD-10-CM | POA: Diagnosis not present

## 2020-08-21 DIAGNOSIS — M25512 Pain in left shoulder: Secondary | ICD-10-CM | POA: Diagnosis not present

## 2020-08-21 DIAGNOSIS — N3946 Mixed incontinence: Secondary | ICD-10-CM | POA: Diagnosis not present

## 2020-08-28 DIAGNOSIS — E039 Hypothyroidism, unspecified: Secondary | ICD-10-CM | POA: Diagnosis not present

## 2020-08-28 DIAGNOSIS — F331 Major depressive disorder, recurrent, moderate: Secondary | ICD-10-CM | POA: Diagnosis not present

## 2020-08-28 DIAGNOSIS — M8580 Other specified disorders of bone density and structure, unspecified site: Secondary | ICD-10-CM | POA: Diagnosis not present

## 2020-08-28 DIAGNOSIS — E7800 Pure hypercholesterolemia, unspecified: Secondary | ICD-10-CM | POA: Diagnosis not present

## 2020-08-28 DIAGNOSIS — I10 Essential (primary) hypertension: Secondary | ICD-10-CM | POA: Diagnosis not present

## 2020-08-28 DIAGNOSIS — G4700 Insomnia, unspecified: Secondary | ICD-10-CM | POA: Diagnosis not present

## 2020-08-28 DIAGNOSIS — F324 Major depressive disorder, single episode, in partial remission: Secondary | ICD-10-CM | POA: Diagnosis not present

## 2020-08-28 DIAGNOSIS — K219 Gastro-esophageal reflux disease without esophagitis: Secondary | ICD-10-CM | POA: Diagnosis not present

## 2020-09-01 DIAGNOSIS — F4310 Post-traumatic stress disorder, unspecified: Secondary | ICD-10-CM | POA: Diagnosis not present

## 2020-09-04 DIAGNOSIS — Z9889 Other specified postprocedural states: Secondary | ICD-10-CM | POA: Diagnosis not present

## 2020-09-04 DIAGNOSIS — M25512 Pain in left shoulder: Secondary | ICD-10-CM | POA: Diagnosis not present

## 2020-09-04 DIAGNOSIS — M6281 Muscle weakness (generalized): Secondary | ICD-10-CM | POA: Diagnosis not present

## 2020-09-08 DIAGNOSIS — Z9889 Other specified postprocedural states: Secondary | ICD-10-CM | POA: Diagnosis not present

## 2020-09-08 DIAGNOSIS — M25512 Pain in left shoulder: Secondary | ICD-10-CM | POA: Diagnosis not present

## 2020-09-08 DIAGNOSIS — M6281 Muscle weakness (generalized): Secondary | ICD-10-CM | POA: Diagnosis not present

## 2020-09-10 DIAGNOSIS — Z9889 Other specified postprocedural states: Secondary | ICD-10-CM | POA: Diagnosis not present

## 2020-09-10 DIAGNOSIS — M6281 Muscle weakness (generalized): Secondary | ICD-10-CM | POA: Diagnosis not present

## 2020-09-10 DIAGNOSIS — M25512 Pain in left shoulder: Secondary | ICD-10-CM | POA: Diagnosis not present

## 2020-09-15 DIAGNOSIS — F4310 Post-traumatic stress disorder, unspecified: Secondary | ICD-10-CM | POA: Diagnosis not present

## 2020-09-16 DIAGNOSIS — M25512 Pain in left shoulder: Secondary | ICD-10-CM | POA: Diagnosis not present

## 2020-09-16 DIAGNOSIS — M6281 Muscle weakness (generalized): Secondary | ICD-10-CM | POA: Diagnosis not present

## 2020-09-16 DIAGNOSIS — Z9889 Other specified postprocedural states: Secondary | ICD-10-CM | POA: Diagnosis not present

## 2020-09-17 DIAGNOSIS — Z9889 Other specified postprocedural states: Secondary | ICD-10-CM | POA: Diagnosis not present

## 2020-09-18 DIAGNOSIS — M25512 Pain in left shoulder: Secondary | ICD-10-CM | POA: Diagnosis not present

## 2020-09-18 DIAGNOSIS — M6281 Muscle weakness (generalized): Secondary | ICD-10-CM | POA: Diagnosis not present

## 2020-09-18 DIAGNOSIS — Z9889 Other specified postprocedural states: Secondary | ICD-10-CM | POA: Diagnosis not present

## 2020-09-22 DIAGNOSIS — F419 Anxiety disorder, unspecified: Secondary | ICD-10-CM | POA: Diagnosis not present

## 2020-09-22 DIAGNOSIS — Z79891 Long term (current) use of opiate analgesic: Secondary | ICD-10-CM | POA: Diagnosis not present

## 2020-09-22 DIAGNOSIS — F330 Major depressive disorder, recurrent, mild: Secondary | ICD-10-CM | POA: Diagnosis not present

## 2020-09-22 DIAGNOSIS — F900 Attention-deficit hyperactivity disorder, predominantly inattentive type: Secondary | ICD-10-CM | POA: Diagnosis not present

## 2020-09-23 DIAGNOSIS — M25512 Pain in left shoulder: Secondary | ICD-10-CM | POA: Diagnosis not present

## 2020-09-23 DIAGNOSIS — M6281 Muscle weakness (generalized): Secondary | ICD-10-CM | POA: Diagnosis not present

## 2020-09-23 DIAGNOSIS — Z9889 Other specified postprocedural states: Secondary | ICD-10-CM | POA: Diagnosis not present

## 2020-09-25 DIAGNOSIS — R350 Frequency of micturition: Secondary | ICD-10-CM | POA: Diagnosis not present

## 2020-09-25 DIAGNOSIS — M25512 Pain in left shoulder: Secondary | ICD-10-CM | POA: Diagnosis not present

## 2020-09-25 DIAGNOSIS — M6281 Muscle weakness (generalized): Secondary | ICD-10-CM | POA: Diagnosis not present

## 2020-09-25 DIAGNOSIS — Z9889 Other specified postprocedural states: Secondary | ICD-10-CM | POA: Diagnosis not present

## 2020-09-25 DIAGNOSIS — N3946 Mixed incontinence: Secondary | ICD-10-CM | POA: Diagnosis not present

## 2020-09-29 DIAGNOSIS — F4310 Post-traumatic stress disorder, unspecified: Secondary | ICD-10-CM | POA: Diagnosis not present

## 2020-09-30 DIAGNOSIS — M25512 Pain in left shoulder: Secondary | ICD-10-CM | POA: Diagnosis not present

## 2020-09-30 DIAGNOSIS — Z9889 Other specified postprocedural states: Secondary | ICD-10-CM | POA: Diagnosis not present

## 2020-09-30 DIAGNOSIS — M6281 Muscle weakness (generalized): Secondary | ICD-10-CM | POA: Diagnosis not present

## 2020-10-02 DIAGNOSIS — Z9889 Other specified postprocedural states: Secondary | ICD-10-CM | POA: Diagnosis not present

## 2020-10-02 DIAGNOSIS — M6281 Muscle weakness (generalized): Secondary | ICD-10-CM | POA: Diagnosis not present

## 2020-10-02 DIAGNOSIS — M25512 Pain in left shoulder: Secondary | ICD-10-CM | POA: Diagnosis not present

## 2020-10-07 DIAGNOSIS — M8580 Other specified disorders of bone density and structure, unspecified site: Secondary | ICD-10-CM | POA: Diagnosis not present

## 2020-10-07 DIAGNOSIS — G4700 Insomnia, unspecified: Secondary | ICD-10-CM | POA: Diagnosis not present

## 2020-10-07 DIAGNOSIS — F331 Major depressive disorder, recurrent, moderate: Secondary | ICD-10-CM | POA: Diagnosis not present

## 2020-10-07 DIAGNOSIS — M6281 Muscle weakness (generalized): Secondary | ICD-10-CM | POA: Diagnosis not present

## 2020-10-07 DIAGNOSIS — E039 Hypothyroidism, unspecified: Secondary | ICD-10-CM | POA: Diagnosis not present

## 2020-10-07 DIAGNOSIS — E7800 Pure hypercholesterolemia, unspecified: Secondary | ICD-10-CM | POA: Diagnosis not present

## 2020-10-07 DIAGNOSIS — M25512 Pain in left shoulder: Secondary | ICD-10-CM | POA: Diagnosis not present

## 2020-10-07 DIAGNOSIS — K219 Gastro-esophageal reflux disease without esophagitis: Secondary | ICD-10-CM | POA: Diagnosis not present

## 2020-10-07 DIAGNOSIS — I10 Essential (primary) hypertension: Secondary | ICD-10-CM | POA: Diagnosis not present

## 2020-10-07 DIAGNOSIS — Z9889 Other specified postprocedural states: Secondary | ICD-10-CM | POA: Diagnosis not present

## 2020-10-07 DIAGNOSIS — F324 Major depressive disorder, single episode, in partial remission: Secondary | ICD-10-CM | POA: Diagnosis not present

## 2020-10-13 DIAGNOSIS — F4310 Post-traumatic stress disorder, unspecified: Secondary | ICD-10-CM | POA: Diagnosis not present

## 2020-10-14 DIAGNOSIS — M6281 Muscle weakness (generalized): Secondary | ICD-10-CM | POA: Diagnosis not present

## 2020-10-14 DIAGNOSIS — M25512 Pain in left shoulder: Secondary | ICD-10-CM | POA: Diagnosis not present

## 2020-10-14 DIAGNOSIS — Z9889 Other specified postprocedural states: Secondary | ICD-10-CM | POA: Diagnosis not present

## 2020-10-16 DIAGNOSIS — Z9889 Other specified postprocedural states: Secondary | ICD-10-CM | POA: Diagnosis not present

## 2020-10-16 DIAGNOSIS — M25512 Pain in left shoulder: Secondary | ICD-10-CM | POA: Diagnosis not present

## 2020-10-16 DIAGNOSIS — M6281 Muscle weakness (generalized): Secondary | ICD-10-CM | POA: Diagnosis not present

## 2020-10-22 ENCOUNTER — Emergency Department (HOSPITAL_COMMUNITY): Payer: PPO

## 2020-10-22 ENCOUNTER — Encounter (HOSPITAL_COMMUNITY): Payer: Self-pay

## 2020-10-22 ENCOUNTER — Other Ambulatory Visit: Payer: Self-pay

## 2020-10-22 ENCOUNTER — Emergency Department (HOSPITAL_COMMUNITY)
Admission: EM | Admit: 2020-10-22 | Discharge: 2020-10-22 | Disposition: A | Discharge status: Home / Self Care | Payer: PPO | Attending: Emergency Medicine | Admitting: Emergency Medicine

## 2020-10-22 DIAGNOSIS — M25512 Pain in left shoulder: Secondary | ICD-10-CM | POA: Diagnosis not present

## 2020-10-22 DIAGNOSIS — W07XXXA Fall from chair, initial encounter: Secondary | ICD-10-CM | POA: Diagnosis not present

## 2020-10-22 DIAGNOSIS — Z87891 Personal history of nicotine dependence: Secondary | ICD-10-CM | POA: Diagnosis not present

## 2020-10-22 DIAGNOSIS — M25511 Pain in right shoulder: Secondary | ICD-10-CM | POA: Insufficient documentation

## 2020-10-22 DIAGNOSIS — Z96653 Presence of artificial knee joint, bilateral: Secondary | ICD-10-CM | POA: Insufficient documentation

## 2020-10-22 DIAGNOSIS — S0101XA Laceration without foreign body of scalp, initial encounter: Secondary | ICD-10-CM

## 2020-10-22 DIAGNOSIS — I10 Essential (primary) hypertension: Secondary | ICD-10-CM | POA: Diagnosis not present

## 2020-10-22 DIAGNOSIS — W19XXXA Unspecified fall, initial encounter: Secondary | ICD-10-CM | POA: Diagnosis not present

## 2020-10-22 DIAGNOSIS — S80212A Abrasion, left knee, initial encounter: Secondary | ICD-10-CM | POA: Diagnosis not present

## 2020-10-22 DIAGNOSIS — Z79899 Other long term (current) drug therapy: Secondary | ICD-10-CM | POA: Insufficient documentation

## 2020-10-22 DIAGNOSIS — E890 Postprocedural hypothyroidism: Secondary | ICD-10-CM | POA: Insufficient documentation

## 2020-10-22 DIAGNOSIS — M6281 Muscle weakness (generalized): Secondary | ICD-10-CM | POA: Diagnosis not present

## 2020-10-22 DIAGNOSIS — T148XXA Other injury of unspecified body region, initial encounter: Secondary | ICD-10-CM | POA: Diagnosis not present

## 2020-10-22 DIAGNOSIS — S0990XA Unspecified injury of head, initial encounter: Secondary | ICD-10-CM

## 2020-10-22 DIAGNOSIS — S0993XA Unspecified injury of face, initial encounter: Secondary | ICD-10-CM | POA: Diagnosis not present

## 2020-10-22 DIAGNOSIS — S43004A Unspecified dislocation of right shoulder joint, initial encounter: Secondary | ICD-10-CM | POA: Diagnosis not present

## 2020-10-22 DIAGNOSIS — S0181XA Laceration without foreign body of other part of head, initial encounter: Secondary | ICD-10-CM | POA: Diagnosis not present

## 2020-10-22 DIAGNOSIS — S80211A Abrasion, right knee, initial encounter: Secondary | ICD-10-CM | POA: Insufficient documentation

## 2020-10-22 DIAGNOSIS — S199XXA Unspecified injury of neck, initial encounter: Secondary | ICD-10-CM | POA: Diagnosis not present

## 2020-10-22 DIAGNOSIS — Z9889 Other specified postprocedural states: Secondary | ICD-10-CM | POA: Diagnosis not present

## 2020-10-22 DIAGNOSIS — R600 Localized edema: Secondary | ICD-10-CM | POA: Diagnosis not present

## 2020-10-22 DIAGNOSIS — S4991XA Unspecified injury of right shoulder and upper arm, initial encounter: Secondary | ICD-10-CM | POA: Diagnosis not present

## 2020-10-22 DIAGNOSIS — R457 State of emotional shock and stress, unspecified: Secondary | ICD-10-CM | POA: Diagnosis not present

## 2020-10-22 DIAGNOSIS — R58 Hemorrhage, not elsewhere classified: Secondary | ICD-10-CM | POA: Diagnosis not present

## 2020-10-22 DIAGNOSIS — M25519 Pain in unspecified shoulder: Secondary | ICD-10-CM | POA: Diagnosis not present

## 2020-10-22 LAB — CBC WITH DIFFERENTIAL/PLATELET
Abs Immature Granulocytes: 0.04 10*3/uL (ref 0.00–0.07)
Basophils Absolute: 0 10*3/uL (ref 0.0–0.1)
Basophils Relative: 1 %
Eosinophils Absolute: 0 10*3/uL (ref 0.0–0.5)
Eosinophils Relative: 1 %
HCT: 37.7 % (ref 36.0–46.0)
Hemoglobin: 11.7 g/dL — ABNORMAL LOW (ref 12.0–15.0)
Immature Granulocytes: 1 %
Lymphocytes Relative: 14 %
Lymphs Abs: 1.2 10*3/uL (ref 0.7–4.0)
MCH: 27.5 pg (ref 26.0–34.0)
MCHC: 31 g/dL (ref 30.0–36.0)
MCV: 88.7 fL (ref 80.0–100.0)
Monocytes Absolute: 0.7 10*3/uL (ref 0.1–1.0)
Monocytes Relative: 8 %
Neutro Abs: 6.6 10*3/uL (ref 1.7–7.7)
Neutrophils Relative %: 75 %
Platelets: 349 10*3/uL (ref 150–400)
RBC: 4.25 MIL/uL (ref 3.87–5.11)
RDW: 15 % (ref 11.5–15.5)
WBC: 8.7 10*3/uL (ref 4.0–10.5)
nRBC: 0 % (ref 0.0–0.2)

## 2020-10-22 LAB — BASIC METABOLIC PANEL
Anion gap: 8 (ref 5–15)
BUN: 17 mg/dL (ref 8–23)
CO2: 26 mmol/L (ref 22–32)
Calcium: 9.7 mg/dL (ref 8.9–10.3)
Chloride: 107 mmol/L (ref 98–111)
Creatinine, Ser: 0.73 mg/dL (ref 0.44–1.00)
GFR, Estimated: >60 mL/min (ref >60)
Glucose, Bld: 99 mg/dL (ref 70–99)
Potassium: 4.2 mmol/L (ref 3.5–5.1)
Sodium: 141 mmol/L (ref 135–145)

## 2020-10-22 MED ORDER — LIDOCAINE-EPINEPHRINE 2 %-1:100000 IJ SOLN
20.0000 mL | Freq: Once | INTRAMUSCULAR | Status: AC
  Administered 2020-10-22: 20 mL
  Filled 2020-10-22: qty 1

## 2020-10-22 MED ORDER — HYDROMORPHONE HCL 1 MG/ML IJ SOLN
1.0000 mg | Freq: Once | INTRAMUSCULAR | Status: AC
  Administered 2020-10-22: 1 mg via INTRAVENOUS
  Filled 2020-10-22: qty 1

## 2020-10-22 NOTE — Discharge Instructions (Addendum)
Take tylenol, motrin for pain   You likely dislocated your shoulder but right now it is in place   Please wear your sling.  Take your Percocet that you have at home as needed for pain  See your orthopedic doctor for follow-up  Suture removal in 7 to 10 days  Return to ER if you have worse headaches, vomiting, recurrent dislocation

## 2020-10-22 NOTE — ED Provider Notes (Signed)
[facility] DEPT Provider Note   CSN: [id number] Arrival date & time: 10/22/20  1921     History Chief Complaint  Patient presents with  . Fall  . Shoulder Injury    Right    [name] [name] is a 70 y.o. female hx of HTN, HL, here with fall.  Patient states that she was being out of chair and tripped over her right foot and landed on both knees and dislocated her right shoulder.  She also fell and hit her eyebrow and has a laceration as well.  She states that it was so painful that she ended up pulling on the shoulder and moving it and was able to pop it back in.  She denies any history of right shoulder dislocation previously but states that she did have left rotator cuff surgery.  No meds prior to arrival  The history is provided by the patient.       Past Medical History:  Diagnosis Date  . ADHD (attention deficit hyperactivity disorder)   . Anemia   . Anxiety   . Arthritis   . Depression   . GERD (gastroesophageal reflux disease)   . History of panic attacks   . Hyperlipidemia   . Hypertension   . Hypothyroidism, postsurgical   . SBO (small bowel obstruction) ([place]) 01/15/2020  . SUI (stress urinary incontinence, female)   . Wears glasses     Patient Active Problem List   Diagnosis Date Noted  . S/P TKR (total knee replacement) using cement, left 03/03/2020  . Degenerative arthritis of left knee 02/29/2020  . SBO (small bowel obstruction) ([place]) 01/15/2020  . S/P TKR (total knee replacement), right 12/19/2019  . Total knee replacement status, right 12/17/2019  . Osteoarthritis of right knee 12/14/2019  . Obesity, morbid ([place]) 12/16/2016  . Strain of iliopsoas muscle, initial encounter 07/15/2016  . Elevated cholesterol   . Urinary incontinence   . Endometrial polyp   . Primary osteoarthritis of knees, bilateral 05/15/2009  . DERANGEMENT OF ANTERIOR HORN OF LATERAL MENISCUS 05/15/2009  . KNEE PAIN 05/15/2009    Past Surgical  History:  Procedure Laterality Date  . D & C HYSTEROSCOPY W/ RESECTION ENDOMETRIAL POLYP  04/15/2009  . D & C HYSTEROSCOPY W/ RESECTOSCOPE AND THERMACHOICE ENDOMETRIAL ABLATION  12/09/2010  . LAPAROSCOPIC ASSISTED VENTRAL HERNIA REPAIR Left 01/16/2020   Procedure: LAPAROSCOPIC ASSISTED HERNIA REPAIR;  Surgeon: Clovis Riley, MD;  Location: [facility];  Service: General;  Laterality: Left;  . PUBOVAGINAL SLING N/A 07/26/2016   Procedure: PUBO-VAGINAL SLING ALTIS SLING;  Surgeon: Carolan Clines, MD;  Location[facility];  Service: Urology;  Laterality: N/A;  . REPAIR SPIGELIAN HERNIA    . RESECTION GIANT CELL TUMOR RIGHT LONG FINGER  09/13/2008  . THYROIDECTOMY  1979    [facility]   non-malignant diseased thyroid  . TOTAL KNEE ARTHROPLASTY Right 12/17/2019   Procedure: RIGHT TOTAL KNEE ARTHROPLASTY;  Surgeon: Frederik Pear, MD;  Location: [facility];  Service: Orthopedics;  Laterality: Right;  . TOTAL KNEE ARTHROPLASTY Left 03/03/2020   Procedure: LEFT TOTAL KNEE ARTHROPLASTY;  Surgeon: Frederik Pear, MD;  Location: [facility];  Service: Orthopedics;  Laterality: Left;  . TRIPLE ARTHRODESIS LEFT FOOT  04/20/2007  . VENTRAL HERNIA REPAIR  2003  . WRIST GANGLION EXCISION Right 1975     OB History    Gravida  3   Para  2   Term  2   Preterm  AB  1   Living  1     SAB      IAB      Ectopic      Multiple      Live Births              Family History  Problem Relation Age of Onset  . Breast cancer Mother   . Uterine cancer Mother   . Heart disease Paternal Grandfather   . Diabetes Maternal Grandfather     Social History   Tobacco Use  . Smoking status: Former Smoker    Years: 6.00    Types: Cigarettes    Quit date: 09/21/1974    Years since quitting: 46.1  . Smokeless tobacco: Never Used  Vaping Use  . Vaping Use: Never used  Substance Use Topics  . Alcohol use: Not Currently    Comment: very rare  . Drug use: No    Home Medications Prior to  Admission medications   Medication Sig Start Date End Date Taking? Authorizing Provider  ALPRAZolam Duanne Moron) 0.5 MG tablet Take 0.5 mg by mouth daily as needed for anxiety or sleep.    Yes [provider]  buPROPion (WELLBUTRIN XL) 300 MG 24 hr tablet Take 300 mg by mouth daily.  05/26/16  Yes [provider]  docusate sodium (COLACE) 100 MG capsule Take 1 capsule (100 mg total) by mouth 2 (two) times daily as needed for mild constipation. 01/21/20  Yes Maczis, Barth Kirks, PA-C  DULoxetine (CYMBALTA) 60 MG capsule Take 120 mg by mouth daily.  05/26/16  Yes [provider]  irbesartan (AVAPRO) 150 MG tablet Take 150 mg by mouth daily. 06/15/19  Yes [provider]  levothyroxine (SYNTHROID, LEVOTHROID) 125 MCG tablet Take 125 mcg by mouth daily before breakfast.  05/02/16  Yes [provider]  lisdexamfetamine (VYVANSE) 50 MG capsule Take 50 mg by mouth daily.    Yes [provider]  MYRBETRIQ 50 MG TB24 tablet Take 50 mg by mouth daily. 10/17/20  Yes [provider]  omeprazole (PRILOSEC) 20 MG capsule Take 20 mg by mouth daily before breakfast.    Yes [provider]  oxyCODONE-acetaminophen (PERCOCET/ROXICET) 5-325 MG tablet Take 1 tablet by mouth every 4 (four) hours as needed for severe pain. 03/03/20  Yes Leighton Parody, PA-C  simvastatin (ZOCOR) 40 MG tablet Take 40 mg by mouth daily.  05/26/16  Yes [provider]  aspirin EC 81 MG tablet Take 1 tablet (81 mg total) by mouth 2 (two) times daily. Patient not taking: No sig reported 03/03/20   Leighton Parody, PA-C  celecoxib (CELEBREX) 200 MG capsule Take 200 mg by mouth 2 (two) times daily. Patient not taking: No sig reported    [provider]  ondansetron (ZOFRAN-ODT) 4 MG disintegrating tablet Take 1 tablet (4 mg total) by mouth every 6 (six) hours as needed for nausea. Patient not taking: Reported on 10/22/2020 03/03/20   Leighton Parody, PA-C  tiZANidine  (ZANAFLEX) 2 MG tablet Take 1 tablet (2 mg total) by mouth every 6 (six) hours as needed. Patient not taking: Reported on 10/22/2020 03/03/20   Leighton Parody, PA-C  torsemide (DEMADEX) 10 MG tablet Take 10 mg by mouth daily as needed (swelling).  Patient not taking: Reported on 10/22/2020 11/26/19   [provider]    Allergies    Ceclor [cefaclor], Bactrim [sulfamethoxazole-trimethoprim], and Gabapentin  Review of Systems   Review of Systems  Musculoskeletal:  R shoulder pain, bilateral knee pain   All other systems reviewed and are negative.   Physical Exam Updated Vital Signs BP (!) 129/46   Pulse 80   Temp 98.3 F (36.8 C) (Oral)   Resp (!) 22   Ht 5\' 3"  (1.6 m)   Wt 108.9 kg   SpO2 98%   BMI 42.51 kg/m   Physical Exam Vitals and nursing note reviewed.  Constitutional:      Comments: Chronically ill  HENT:     Head: Normocephalic.     Comments: Patient has two 1 inch laceration in the right forehead.    Nose: Nose normal.     Mouth/Throat:     Mouth: Mucous membranes are moist.  Eyes:     Extraocular Movements: Extraocular movements intact.     Pupils: Pupils are equal, round, and reactive to light.  Cardiovascular:     Rate and Rhythm: Normal rate and regular rhythm.     Pulses: Normal pulses.     Heart sounds: Normal heart sounds.  Pulmonary:     Effort: Pulmonary effort is normal.     Breath sounds: Normal breath sounds.  Abdominal:     General: Abdomen is flat.     Palpations: Abdomen is soft.  Musculoskeletal:     Cervical back: Normal range of motion and neck supple.     Comments: Right shoulder decreased range of motion.  No major function of the left shoulder.  Patient has abrasions to bilateral knees  Skin:    General: Skin is warm.     Capillary Refill: Capillary refill takes less than 2 seconds.  Neurological:     General: No focal deficit present.     Mental Status: She is oriented to person, place, and time.  Psychiatric:         Mood and Affect: Mood normal.        Behavior: Behavior normal.     ED Results / Procedures / Treatments   Labs (all labs ordered are listed, but only abnormal results are displayed) Labs Reviewed  CBC WITH DIFFERENTIAL/PLATELET - Abnormal; Notable for the following components:      Result Value   Hemoglobin 11.7 (*)    All other components within normal limits  BASIC METABOLIC PANEL    EKG None  Radiology DG Shoulder Right  Result Date: 10/22/2020 CLINICAL DATA:  Fall getting out of chair. Patient reports shoulder popped out and she popped it back in. EXAM: RIGHT SHOULDER - 2+ VIEW COMPARISON:  None. FINDINGS: Normal alignment. No evidence of fracture. Mild acromioclavicular degenerative change. Soft tissue attenuation from habitus limits detailed assessment. IMPRESSION: No fracture or subluxation of the right shoulder. Mild acromioclavicular degenerative change. Electronically Signed   By: Keith Rake M.D.   On: 10/22/2020 20:46   CT Head Wo Contrast  Result Date: 10/22/2020 CLINICAL DATA:  Facial trauma.  Fall getting out of a chair today. EXAM: CT HEAD WITHOUT CONTRAST TECHNIQUE: Contiguous axial images were obtained from the base of the skull through the vertex without intravenous contrast. COMPARISON:  None. FINDINGS: Brain: No intracranial hemorrhage, mass effect, or midline shift. No hydrocephalus. The basilar cisterns are patent. No evidence of territorial infarct or acute ischemia. No extra-axial or intracranial fluid collection. Vascular: No hyperdense vessel or unexpected calcification. Skull: No fracture or focal lesion. Sinuses/Orbits: No acute findings. Paranasal sinuses and mastoid air cells are clear. The visualized orbits are unremarkable. Other: Soft tissue edema Ephriam Jenkins probable laceration right inferior frontal  scalp. IMPRESSION: Soft tissue edema and probable laceration right inferior frontal scalp. No acute intracranial abnormality. No skull fracture. Electronically  Signed   By: Keith Rake M.D.   On: 10/22/2020 20:49   CT Cervical Spine Wo Contrast  Result Date: 10/22/2020 CLINICAL DATA:  Neck trauma.  Fall out of chair today. EXAM: CT CERVICAL SPINE WITHOUT CONTRAST TECHNIQUE: Multidetector CT imaging of the cervical spine was performed without intravenous contrast. Multiplanar CT image reconstructions were also generated. COMPARISON:  None. FINDINGS: Alignment: Straightening of normal lordosis. No traumatic subluxation. Skull base and vertebrae: No acute fracture. Vertebral body heights are maintained. The dens and skull base are intact. Soft tissues and spinal canal: No prevertebral fluid or swelling. No visible canal hematoma. Disc levels: Diffuse degenerative disc disease with disc space narrowing and endplate spurring. Diffuse facet hypertrophy. Upper chest: No acute findings. Other: Left lobe of the thyroid is heterogeneously enlarged in causes mild rightward tracheal deviation. IMPRESSION: 1. Multilevel degenerative disc disease and facet hypertrophy throughout the cervical spine. No acute fracture or subluxation. 2. Heterogeneously enlarged left lobe of the thyroid gland causes mild rightward tracheal deviation. Recommend thyroid ultrasound (ref: J Am Coll Radiol. 2015 Feb;12(2): 143-50). Electronically Signed   By: Keith Rake M.D.   On: 10/22/2020 20:53   DG Knee Complete 4 Views Left  Result Date: 10/22/2020 CLINICAL DATA:  Fall getting out of chair. Fell onto both knees, pain worse on the right. EXAM: LEFT KNEE - COMPLETE 4+ VIEW COMPARISON:  03/13/2020 FINDINGS: Left knee arthroplasty in expected alignment. No periprosthetic lucency or fracture. There has been patellar resurfacing. No significant joint effusion. Mild soft tissue edema anteriorly. IMPRESSION: Left knee arthroplasty without complication or acute fracture. Electronically Signed   By: Keith Rake M.D.   On: 10/22/2020 20:43   DG Knee Complete 4 Views Right  Result Date:  10/22/2020 CLINICAL DATA:  Fall getting out of chair. Fall onto both knees, pain worse on the right. EXAM: RIGHT KNEE - COMPLETE 4+ VIEW COMPARISON:  Radiograph 12/27/2019 FINDINGS: Right knee arthroplasty in expected alignment. No periprosthetic lucency or fracture. There has been patellar resurfacing. Minimal heterotopic calcification adjacent to the superior patellar pole. No significant joint effusion. Mild soft tissue edema anteriorly. IMPRESSION: Right knee arthroplasty without complication or acute fracture. Electronically Signed   By: Keith Rake M.D.   On: 10/22/2020 20:45    Procedures Procedures  LACERATION REPAIR Performed by: Wandra Arthurs Authorized by: Wandra Arthurs Consent: Verbal consent obtained. Risks and benefits: risks, benefits and alternatives were discussed Consent given by: patient Patient identity confirmed: provided demographic data Prepped and Draped in normal sterile fashion Wound explored  Laceration Location: R forehead  Laceration Length: 3 cm  No Foreign Bodies seen or palpated  Anesthesia: local infiltration  Local anesthetic: lidocaine 2 % with epinephrine  Anesthetic total: 10 ml  Irrigation method: syringe Amount of cleaning: standard  Skin closure: 5-0 ethilon  Number of sutures: 8  Technique: simple interrupted   Patient tolerance: Patient tolerated the procedure well with no immediate complications.   Medications Ordered in ED Medications  HYDROmorphone (DILAUDID) injection 1 mg (1 mg Intravenous Given 10/22/20 2059)  lidocaine-EPINEPHrine (XYLOCAINE W/EPI) 2 %-1:100000 (with pres) injection 20 mL (20 mLs Infiltration Given by Other 10/22/20 2155)    ED Course  I have reviewed the triage vital signs and the nursing notes.  Pertinent labs & imaging results that were available during my care of the patient were reviewed by me  and considered in my medical decision making (see chart for details).    MDM Rules/Calculators/A&P                          [name] [name] is a 70 y.o. female here with fall and scalp laceration and possible right shoulder dislocation.  Patient states that it is painful to move her shoulder but it appears to be in anatomical position.  Will get x-rays to confirm.  We will also get CT head and neck given that she hit her head.  We will also sutured lacerations and patient's tetanus is up-to-date  10:06 PM Xrays unremarkable. CT head/neck unremarkable. Laceration sutured. Stable for dc home.    Final Clinical Impression(s) / ED Diagnoses Final diagnoses:  None    Rx / DC Orders ED Discharge Orders    None       Drenda Freeze, MD 10/22/20 2210

## 2020-10-22 NOTE — ED Triage Notes (Signed)
Patient arrived via [facility] from home.   C/o fall   [address] getting out of chair. Knees buckled. Fell onto right shoulder. Patient states her should popped out and she popped it back in before ems arrived.   C/O right should pain. Laceration noted above right eye brow   Hx: should and knee pain    A/O4 Ambulatory needs assistance standing   VS: 146/84 HR-85 98% RA 98.2

## 2020-10-24 DIAGNOSIS — M6281 Muscle weakness (generalized): Secondary | ICD-10-CM | POA: Diagnosis not present

## 2020-10-24 DIAGNOSIS — F900 Attention-deficit hyperactivity disorder, predominantly inattentive type: Secondary | ICD-10-CM | POA: Diagnosis not present

## 2020-10-24 DIAGNOSIS — F330 Major depressive disorder, recurrent, mild: Secondary | ICD-10-CM | POA: Diagnosis not present

## 2020-10-24 DIAGNOSIS — M25512 Pain in left shoulder: Secondary | ICD-10-CM | POA: Diagnosis not present

## 2020-10-24 DIAGNOSIS — Z9889 Other specified postprocedural states: Secondary | ICD-10-CM | POA: Diagnosis not present

## 2020-10-24 DIAGNOSIS — F419 Anxiety disorder, unspecified: Secondary | ICD-10-CM | POA: Diagnosis not present

## 2020-10-27 DIAGNOSIS — F4310 Post-traumatic stress disorder, unspecified: Secondary | ICD-10-CM | POA: Diagnosis not present

## 2020-10-29 DIAGNOSIS — M6281 Muscle weakness (generalized): Secondary | ICD-10-CM | POA: Diagnosis not present

## 2020-10-29 DIAGNOSIS — M25311 Other instability, right shoulder: Secondary | ICD-10-CM | POA: Diagnosis not present

## 2020-10-29 DIAGNOSIS — R296 Repeated falls: Secondary | ICD-10-CM | POA: Diagnosis not present

## 2020-10-29 DIAGNOSIS — Z9889 Other specified postprocedural states: Secondary | ICD-10-CM | POA: Diagnosis not present

## 2020-10-29 DIAGNOSIS — M25512 Pain in left shoulder: Secondary | ICD-10-CM | POA: Diagnosis not present

## 2020-10-31 DIAGNOSIS — M25512 Pain in left shoulder: Secondary | ICD-10-CM | POA: Diagnosis not present

## 2020-10-31 DIAGNOSIS — Z9889 Other specified postprocedural states: Secondary | ICD-10-CM | POA: Diagnosis not present

## 2020-10-31 DIAGNOSIS — M6281 Muscle weakness (generalized): Secondary | ICD-10-CM | POA: Diagnosis not present

## 2020-11-12 DIAGNOSIS — M6281 Muscle weakness (generalized): Secondary | ICD-10-CM | POA: Diagnosis not present

## 2020-11-12 DIAGNOSIS — M25512 Pain in left shoulder: Secondary | ICD-10-CM | POA: Diagnosis not present

## 2020-11-12 DIAGNOSIS — Z9889 Other specified postprocedural states: Secondary | ICD-10-CM | POA: Diagnosis not present

## 2020-11-18 DIAGNOSIS — M6281 Muscle weakness (generalized): Secondary | ICD-10-CM | POA: Diagnosis not present

## 2020-11-18 DIAGNOSIS — Z9889 Other specified postprocedural states: Secondary | ICD-10-CM | POA: Diagnosis not present

## 2020-11-18 DIAGNOSIS — M25512 Pain in left shoulder: Secondary | ICD-10-CM | POA: Diagnosis not present

## 2020-11-19 DIAGNOSIS — M75121 Complete rotator cuff tear or rupture of right shoulder, not specified as traumatic: Secondary | ICD-10-CM | POA: Diagnosis not present

## 2020-11-20 DIAGNOSIS — M6281 Muscle weakness (generalized): Secondary | ICD-10-CM | POA: Diagnosis not present

## 2020-11-20 DIAGNOSIS — Z9889 Other specified postprocedural states: Secondary | ICD-10-CM | POA: Diagnosis not present

## 2020-11-20 DIAGNOSIS — M25512 Pain in left shoulder: Secondary | ICD-10-CM | POA: Diagnosis not present

## 2020-11-24 DIAGNOSIS — F4310 Post-traumatic stress disorder, unspecified: Secondary | ICD-10-CM | POA: Diagnosis not present

## 2020-11-24 DIAGNOSIS — M25512 Pain in left shoulder: Secondary | ICD-10-CM | POA: Diagnosis not present

## 2020-11-24 DIAGNOSIS — Z9889 Other specified postprocedural states: Secondary | ICD-10-CM | POA: Diagnosis not present

## 2020-11-24 DIAGNOSIS — M6281 Muscle weakness (generalized): Secondary | ICD-10-CM | POA: Diagnosis not present

## 2020-11-25 DIAGNOSIS — M8580 Other specified disorders of bone density and structure, unspecified site: Secondary | ICD-10-CM | POA: Diagnosis not present

## 2020-11-25 DIAGNOSIS — F419 Anxiety disorder, unspecified: Secondary | ICD-10-CM | POA: Diagnosis not present

## 2020-11-25 DIAGNOSIS — F331 Major depressive disorder, recurrent, moderate: Secondary | ICD-10-CM | POA: Diagnosis not present

## 2020-11-25 DIAGNOSIS — I10 Essential (primary) hypertension: Secondary | ICD-10-CM | POA: Diagnosis not present

## 2020-11-25 DIAGNOSIS — K219 Gastro-esophageal reflux disease without esophagitis: Secondary | ICD-10-CM | POA: Diagnosis not present

## 2020-11-25 DIAGNOSIS — F900 Attention-deficit hyperactivity disorder, predominantly inattentive type: Secondary | ICD-10-CM | POA: Diagnosis not present

## 2020-11-25 DIAGNOSIS — R609 Edema, unspecified: Secondary | ICD-10-CM | POA: Diagnosis not present

## 2020-11-25 DIAGNOSIS — E7800 Pure hypercholesterolemia, unspecified: Secondary | ICD-10-CM | POA: Diagnosis not present

## 2020-11-25 DIAGNOSIS — E039 Hypothyroidism, unspecified: Secondary | ICD-10-CM | POA: Diagnosis not present

## 2020-11-25 DIAGNOSIS — Z0000 Encounter for general adult medical examination without abnormal findings: Secondary | ICD-10-CM | POA: Diagnosis not present

## 2020-11-25 DIAGNOSIS — G4700 Insomnia, unspecified: Secondary | ICD-10-CM | POA: Diagnosis not present

## 2020-11-25 DIAGNOSIS — R7303 Prediabetes: Secondary | ICD-10-CM | POA: Diagnosis not present

## 2020-11-26 DIAGNOSIS — Z23 Encounter for immunization: Secondary | ICD-10-CM | POA: Diagnosis not present

## 2020-12-02 DIAGNOSIS — M25511 Pain in right shoulder: Secondary | ICD-10-CM | POA: Diagnosis not present

## 2020-12-05 DIAGNOSIS — M75121 Complete rotator cuff tear or rupture of right shoulder, not specified as traumatic: Secondary | ICD-10-CM | POA: Diagnosis not present

## 2020-12-08 ENCOUNTER — Other Ambulatory Visit: Payer: Self-pay | Admitting: Family Medicine

## 2020-12-08 DIAGNOSIS — E049 Nontoxic goiter, unspecified: Secondary | ICD-10-CM

## 2020-12-08 DIAGNOSIS — F4310 Post-traumatic stress disorder, unspecified: Secondary | ICD-10-CM | POA: Diagnosis not present

## 2020-12-09 DIAGNOSIS — E7800 Pure hypercholesterolemia, unspecified: Secondary | ICD-10-CM | POA: Diagnosis not present

## 2020-12-09 DIAGNOSIS — F324 Major depressive disorder, single episode, in partial remission: Secondary | ICD-10-CM | POA: Diagnosis not present

## 2020-12-09 DIAGNOSIS — K219 Gastro-esophageal reflux disease without esophagitis: Secondary | ICD-10-CM | POA: Diagnosis not present

## 2020-12-09 DIAGNOSIS — G4700 Insomnia, unspecified: Secondary | ICD-10-CM | POA: Diagnosis not present

## 2020-12-09 DIAGNOSIS — M8580 Other specified disorders of bone density and structure, unspecified site: Secondary | ICD-10-CM | POA: Diagnosis not present

## 2020-12-09 DIAGNOSIS — E039 Hypothyroidism, unspecified: Secondary | ICD-10-CM | POA: Diagnosis not present

## 2020-12-09 DIAGNOSIS — F331 Major depressive disorder, recurrent, moderate: Secondary | ICD-10-CM | POA: Diagnosis not present

## 2020-12-09 DIAGNOSIS — I10 Essential (primary) hypertension: Secondary | ICD-10-CM | POA: Diagnosis not present

## 2020-12-10 ENCOUNTER — Ambulatory Visit
Admission: RE | Admit: 2020-12-10 | Discharge: 2020-12-10 | Disposition: A | Discharge status: Home / Self Care | Payer: PPO | Source: Ambulatory Visit | Attending: Family Medicine | Admitting: Family Medicine

## 2020-12-10 DIAGNOSIS — E049 Nontoxic goiter, unspecified: Secondary | ICD-10-CM

## 2020-12-10 DIAGNOSIS — E041 Nontoxic single thyroid nodule: Secondary | ICD-10-CM | POA: Diagnosis not present

## 2020-12-16 DIAGNOSIS — G8918 Other acute postprocedural pain: Secondary | ICD-10-CM | POA: Diagnosis not present

## 2020-12-16 DIAGNOSIS — S43391A Subluxation of other parts of right shoulder girdle, initial encounter: Secondary | ICD-10-CM | POA: Diagnosis not present

## 2020-12-16 DIAGNOSIS — X58XXXA Exposure to other specified factors, initial encounter: Secondary | ICD-10-CM | POA: Diagnosis not present

## 2020-12-16 DIAGNOSIS — M94211 Chondromalacia, right shoulder: Secondary | ICD-10-CM | POA: Diagnosis not present

## 2020-12-16 DIAGNOSIS — W19XXXA Unspecified fall, initial encounter: Secondary | ICD-10-CM | POA: Diagnosis not present

## 2020-12-16 DIAGNOSIS — S46011A Strain of muscle(s) and tendon(s) of the rotator cuff of right shoulder, initial encounter: Secondary | ICD-10-CM | POA: Diagnosis not present

## 2020-12-16 DIAGNOSIS — M7541 Impingement syndrome of right shoulder: Secondary | ICD-10-CM | POA: Diagnosis not present

## 2020-12-22 ENCOUNTER — Other Ambulatory Visit: Payer: Self-pay | Admitting: Family Medicine

## 2020-12-22 DIAGNOSIS — E041 Nontoxic single thyroid nodule: Secondary | ICD-10-CM

## 2020-12-24 ENCOUNTER — Other Ambulatory Visit (HOSPITAL_COMMUNITY)
Admission: RE | Admit: 2020-12-24 | Discharge: 2020-12-24 | Disposition: A | Discharge status: Home / Self Care | Payer: PPO | Source: Ambulatory Visit | Attending: Radiology | Admitting: Radiology

## 2020-12-24 ENCOUNTER — Ambulatory Visit
Admission: RE | Admit: 2020-12-24 | Discharge: 2020-12-24 | Disposition: A | Discharge status: Home / Self Care | Payer: PPO | Source: Ambulatory Visit | Attending: Family Medicine | Admitting: Family Medicine

## 2020-12-24 DIAGNOSIS — R896 Abnormal cytological findings in specimens from other organs, systems and tissues: Secondary | ICD-10-CM | POA: Diagnosis not present

## 2020-12-24 DIAGNOSIS — D440 Neoplasm of uncertain behavior of thyroid gland: Secondary | ICD-10-CM | POA: Diagnosis not present

## 2020-12-24 DIAGNOSIS — Z9889 Other specified postprocedural states: Secondary | ICD-10-CM | POA: Diagnosis not present

## 2020-12-24 DIAGNOSIS — E041 Nontoxic single thyroid nodule: Secondary | ICD-10-CM | POA: Diagnosis not present

## 2020-12-25 LAB — CYTOLOGY - NON PAP

## 2020-12-30 DIAGNOSIS — E041 Nontoxic single thyroid nodule: Secondary | ICD-10-CM | POA: Diagnosis not present

## 2021-01-07 DIAGNOSIS — M25611 Stiffness of right shoulder, not elsewhere classified: Secondary | ICD-10-CM | POA: Diagnosis not present

## 2021-01-07 DIAGNOSIS — Z9889 Other specified postprocedural states: Secondary | ICD-10-CM | POA: Diagnosis not present

## 2021-01-07 DIAGNOSIS — M25511 Pain in right shoulder: Secondary | ICD-10-CM | POA: Diagnosis not present

## 2021-01-07 DIAGNOSIS — M6281 Muscle weakness (generalized): Secondary | ICD-10-CM | POA: Diagnosis not present

## 2021-01-09 ENCOUNTER — Encounter (HOSPITAL_COMMUNITY): Payer: Self-pay

## 2021-01-20 DIAGNOSIS — Z9889 Other specified postprocedural states: Secondary | ICD-10-CM | POA: Diagnosis not present

## 2021-01-20 DIAGNOSIS — M6281 Muscle weakness (generalized): Secondary | ICD-10-CM | POA: Diagnosis not present

## 2021-01-20 DIAGNOSIS — M25511 Pain in right shoulder: Secondary | ICD-10-CM | POA: Diagnosis not present

## 2021-01-20 DIAGNOSIS — M25611 Stiffness of right shoulder, not elsewhere classified: Secondary | ICD-10-CM | POA: Diagnosis not present

## 2021-01-23 DIAGNOSIS — M25611 Stiffness of right shoulder, not elsewhere classified: Secondary | ICD-10-CM | POA: Diagnosis not present

## 2021-01-23 DIAGNOSIS — Z9889 Other specified postprocedural states: Secondary | ICD-10-CM | POA: Diagnosis not present

## 2021-01-23 DIAGNOSIS — M6281 Muscle weakness (generalized): Secondary | ICD-10-CM | POA: Diagnosis not present

## 2021-01-23 DIAGNOSIS — M25511 Pain in right shoulder: Secondary | ICD-10-CM | POA: Diagnosis not present

## 2021-01-26 DIAGNOSIS — F900 Attention-deficit hyperactivity disorder, predominantly inattentive type: Secondary | ICD-10-CM | POA: Diagnosis not present

## 2021-01-26 DIAGNOSIS — F419 Anxiety disorder, unspecified: Secondary | ICD-10-CM | POA: Diagnosis not present

## 2021-01-26 DIAGNOSIS — F330 Major depressive disorder, recurrent, mild: Secondary | ICD-10-CM | POA: Diagnosis not present

## 2021-01-27 DIAGNOSIS — M25611 Stiffness of right shoulder, not elsewhere classified: Secondary | ICD-10-CM | POA: Diagnosis not present

## 2021-01-27 DIAGNOSIS — Z9889 Other specified postprocedural states: Secondary | ICD-10-CM | POA: Diagnosis not present

## 2021-01-27 DIAGNOSIS — M6281 Muscle weakness (generalized): Secondary | ICD-10-CM | POA: Diagnosis not present

## 2021-01-27 DIAGNOSIS — M25511 Pain in right shoulder: Secondary | ICD-10-CM | POA: Diagnosis not present

## 2021-01-28 DIAGNOSIS — Z9889 Other specified postprocedural states: Secondary | ICD-10-CM | POA: Diagnosis not present

## 2021-01-30 DIAGNOSIS — Z9889 Other specified postprocedural states: Secondary | ICD-10-CM | POA: Diagnosis not present

## 2021-01-30 DIAGNOSIS — M25611 Stiffness of right shoulder, not elsewhere classified: Secondary | ICD-10-CM | POA: Diagnosis not present

## 2021-01-30 DIAGNOSIS — M6281 Muscle weakness (generalized): Secondary | ICD-10-CM | POA: Diagnosis not present

## 2021-01-30 DIAGNOSIS — M25511 Pain in right shoulder: Secondary | ICD-10-CM | POA: Diagnosis not present

## 2021-02-03 DIAGNOSIS — Z9889 Other specified postprocedural states: Secondary | ICD-10-CM | POA: Diagnosis not present

## 2021-02-03 DIAGNOSIS — M6281 Muscle weakness (generalized): Secondary | ICD-10-CM | POA: Diagnosis not present

## 2021-02-03 DIAGNOSIS — F4310 Post-traumatic stress disorder, unspecified: Secondary | ICD-10-CM | POA: Diagnosis not present

## 2021-02-03 DIAGNOSIS — M25611 Stiffness of right shoulder, not elsewhere classified: Secondary | ICD-10-CM | POA: Diagnosis not present

## 2021-02-03 DIAGNOSIS — M25511 Pain in right shoulder: Secondary | ICD-10-CM | POA: Diagnosis not present

## 2021-02-11 ENCOUNTER — Ambulatory Visit: Payer: Self-pay | Admitting: Surgery

## 2021-02-11 DIAGNOSIS — Z9889 Other specified postprocedural states: Secondary | ICD-10-CM | POA: Diagnosis not present

## 2021-02-11 DIAGNOSIS — M6281 Muscle weakness (generalized): Secondary | ICD-10-CM | POA: Diagnosis not present

## 2021-02-11 DIAGNOSIS — E041 Nontoxic single thyroid nodule: Secondary | ICD-10-CM | POA: Diagnosis not present

## 2021-02-11 DIAGNOSIS — M25611 Stiffness of right shoulder, not elsewhere classified: Secondary | ICD-10-CM | POA: Diagnosis not present

## 2021-02-11 DIAGNOSIS — M25511 Pain in right shoulder: Secondary | ICD-10-CM | POA: Diagnosis not present

## 2021-02-11 DIAGNOSIS — D440 Neoplasm of uncertain behavior of thyroid gland: Secondary | ICD-10-CM | POA: Diagnosis not present

## 2021-02-13 DIAGNOSIS — Z9889 Other specified postprocedural states: Secondary | ICD-10-CM | POA: Diagnosis not present

## 2021-02-13 DIAGNOSIS — M25611 Stiffness of right shoulder, not elsewhere classified: Secondary | ICD-10-CM | POA: Diagnosis not present

## 2021-02-13 DIAGNOSIS — M6281 Muscle weakness (generalized): Secondary | ICD-10-CM | POA: Diagnosis not present

## 2021-02-13 DIAGNOSIS — M25511 Pain in right shoulder: Secondary | ICD-10-CM | POA: Diagnosis not present

## 2021-02-17 DIAGNOSIS — Z9889 Other specified postprocedural states: Secondary | ICD-10-CM | POA: Diagnosis not present

## 2021-02-17 DIAGNOSIS — M25611 Stiffness of right shoulder, not elsewhere classified: Secondary | ICD-10-CM | POA: Diagnosis not present

## 2021-02-17 DIAGNOSIS — M25511 Pain in right shoulder: Secondary | ICD-10-CM | POA: Diagnosis not present

## 2021-02-17 DIAGNOSIS — M6281 Muscle weakness (generalized): Secondary | ICD-10-CM | POA: Diagnosis not present

## 2021-02-20 DIAGNOSIS — M25511 Pain in right shoulder: Secondary | ICD-10-CM | POA: Diagnosis not present

## 2021-02-20 DIAGNOSIS — M25611 Stiffness of right shoulder, not elsewhere classified: Secondary | ICD-10-CM | POA: Diagnosis not present

## 2021-02-20 DIAGNOSIS — M6281 Muscle weakness (generalized): Secondary | ICD-10-CM | POA: Diagnosis not present

## 2021-02-20 DIAGNOSIS — Z9889 Other specified postprocedural states: Secondary | ICD-10-CM | POA: Diagnosis not present

## 2021-02-23 DIAGNOSIS — F4310 Post-traumatic stress disorder, unspecified: Secondary | ICD-10-CM | POA: Diagnosis not present

## 2021-02-24 DIAGNOSIS — M25611 Stiffness of right shoulder, not elsewhere classified: Secondary | ICD-10-CM | POA: Diagnosis not present

## 2021-02-24 DIAGNOSIS — M25511 Pain in right shoulder: Secondary | ICD-10-CM | POA: Diagnosis not present

## 2021-02-24 DIAGNOSIS — Z9889 Other specified postprocedural states: Secondary | ICD-10-CM | POA: Diagnosis not present

## 2021-02-24 DIAGNOSIS — M6281 Muscle weakness (generalized): Secondary | ICD-10-CM | POA: Diagnosis not present

## 2021-02-27 DIAGNOSIS — M25511 Pain in right shoulder: Secondary | ICD-10-CM | POA: Diagnosis not present

## 2021-02-27 DIAGNOSIS — M6281 Muscle weakness (generalized): Secondary | ICD-10-CM | POA: Diagnosis not present

## 2021-02-27 DIAGNOSIS — M25611 Stiffness of right shoulder, not elsewhere classified: Secondary | ICD-10-CM | POA: Diagnosis not present

## 2021-02-27 DIAGNOSIS — Z9889 Other specified postprocedural states: Secondary | ICD-10-CM | POA: Diagnosis not present

## 2021-03-03 DIAGNOSIS — Z9889 Other specified postprocedural states: Secondary | ICD-10-CM | POA: Diagnosis not present

## 2021-03-03 DIAGNOSIS — M25611 Stiffness of right shoulder, not elsewhere classified: Secondary | ICD-10-CM | POA: Diagnosis not present

## 2021-03-03 DIAGNOSIS — M6281 Muscle weakness (generalized): Secondary | ICD-10-CM | POA: Diagnosis not present

## 2021-03-03 DIAGNOSIS — M25511 Pain in right shoulder: Secondary | ICD-10-CM | POA: Diagnosis not present

## 2021-03-06 DIAGNOSIS — M25511 Pain in right shoulder: Secondary | ICD-10-CM | POA: Diagnosis not present

## 2021-03-06 DIAGNOSIS — M25611 Stiffness of right shoulder, not elsewhere classified: Secondary | ICD-10-CM | POA: Diagnosis not present

## 2021-03-06 DIAGNOSIS — Z9889 Other specified postprocedural states: Secondary | ICD-10-CM | POA: Diagnosis not present

## 2021-03-06 DIAGNOSIS — M6281 Muscle weakness (generalized): Secondary | ICD-10-CM | POA: Diagnosis not present

## 2021-03-06 NOTE — Progress Notes (Addendum)
PCP - Dr. Maury Dus  Cardiologist - no  PPM/ICD -  Device Orders -  Rep Notified -   Chest x-ray -  EKG - 03-09-21 Stress Test - 2019 ECHO -  Cardiac Cath -   Sleep Study -  CPAP -   Fasting Blood Sugar -  Checks Blood Sugar _____ times a day  Blood Thinner Instructions: Aspirin Instructions:  ERAS Protcol - PRE-SURGERY Ensure or G2-   COVID TEST- 03-13-21  Activity--Walks in the pool without SOB, Able to do ADL's without SOB Anesthesia review: HTN  Patient denies shortness of breath, fever, cough and chest pain at PAT appointment   All instructions explained to the patient, with a verbal understanding of the material. Patient agrees to go over the instructions while at home for a better understanding. Patient also instructed to self quarantine after being tested for COVID-19. The opportunity to ask questions was provided.

## 2021-03-06 NOTE — Patient Instructions (Addendum)
DUE TO COVID-19 ONLY ONE VISITOR IS ALLOWED TO COME WITH YOU AND STAY IN THE WAITING ROOM ONLY DURING PRE OP AND PROCEDURE DAY OF SURGERY.   TWO VISITOR  MAY VISIT WITH YOU AFTER SURGERY IN YOUR PRIVATE ROOM DURING VISITING HOURS ONLY!  YOU NEED TO HAVE A COVID 19 TEST ON__7-28-22_____ '@_______'$ , THIS TEST MUST BE DONE BEFORE SURGERY,    COVID TESTING SITE [address] [place] [address],   IT IS ON THE RIGHT GOING OUT [address] APPROXIMATELY  2 MINUTES PAST [employer] ON THE RIGHT.   ONCE YOUR COVID TEST IS COMPLETED,  PLEASE Wear a mask when in public           Your procedure is scheduled on: 03-17-21   Report to [facility] Main  Entrance   Report to short stay  at   [place] AM     Call this number if you have problems the morning of surgery [phone]    Remember: Do not eat food :After Midnight. You may have clear liquids until 0430 am then nothing by mouth      CLEAR LIQUID DIET   Foods Allowed                                                                     water Black Coffee and tea, regular and decaf                             Plain Jell-O any favor except red or purple                                            Fruit ices (not with fruit pulp)                                     Iced Popsicles                                    Carbonated beverages, regular and diet                                    Cranberry, grape and apple juices Sports drinks like Gatorade Lightly seasoned clear broth or consume(fat free) Sugar, honey syrup   _____________________________________________________________________     BRUSH YOUR TEETH MORNING OF SURGERY AND RINSE YOUR MOUTH OUT, NO CHEWING GUM CANDY OR MINTS.     Take these medicines the morning of surgery with A SIP OF WATER: omeprazole, myrbetriq, simvastatin, Wellbutrin, levothyroxine, Cymbalta, xanax if needed                                 You may not have any metal on your  body including hair pins and  piercings  Do not wear jewelry, make-up, lotions, powders or perfumes, deodorant             Do not wear nail polish on your fingernails or toenails .  Do not shave  48 hours prior to surgery.             Do not bring valuables to the hospital. [place].  Contacts, dentures or bridgework may not be worn into surgery.       Patients discharged the day of surgery will not be allowed to drive home. IF YOU ARE HAVING SURGERY AND GOING HOME THE SAME DAY, YOU MUST HAVE AN ADULT TO DRIVE YOU HOME AND BE WITH YOU FOR 24 HOURS. YOU MAY GO HOME BY TAXI OR UBER OR ORTHERWISE, BUT AN ADULT MUST ACCOMPANY YOU HOME AND STAY WITH YOU FOR 24 HOURS.  Name and phone number of your driver:  Special Instructions: N/A              Please read over the following fact sheets you were given: _____________________________________________________________________             [facility] - Preparing for Surgery Before surgery, you can play an important role.  Because skin is not sterile, your skin needs to be as free of germs as possible.  You can reduce the number of germs on your skin by washing with CHG (chlorahexidine gluconate) soap before surgery.  CHG is an antiseptic cleaner which kills germs and bonds with the skin to continue killing germs even after washing. Please DO NOT use if you have an allergy to CHG or antibacterial soaps.  If your skin becomes reddened/irritated stop using the CHG and inform your nurse when you arrive at Short Stay. Do not shave (including legs and underarms) for at least 48 hours prior to the first CHG shower.  You may shave your face/neck. Please follow these instructions carefully:  1.  Shower with CHG Soap the night before surgery and the  morning of Surgery.  2.  If you choose to wash your hair, wash your hair first as usual with your  normal  shampoo.  3.  After you shampoo, rinse your  hair and body thoroughly to remove the  shampoo.                           4.  Use CHG as you would any other liquid soap.  You can apply chg directly  to the skin and wash                       Gently with a scrungie or clean washcloth.  5.  Apply the CHG Soap to your body ONLY FROM THE NECK DOWN.   Do not use on face/ open                           Wound or open sores. Avoid contact with eyes, ears mouth and genitals (private parts).                       Wash face,  Genitals (private parts) with your normal soap.             6.  Wash thoroughly, paying special attention to the  area where your surgery  will be performed.  7.  Thoroughly rinse your body with warm water from the neck down.  8.  DO NOT shower/wash with your normal soap after using and rinsing off  the CHG Soap.                9.  Pat yourself dry with a clean towel.            10.  Wear clean pajamas.            11.  Place clean sheets on your bed the night of your first shower and do not  sleep with pets. Day of Surgery : Do not apply any lotions/deodorants the morning of surgery.  Please wear clean clothes to the hospital/surgery center.  FAILURE TO FOLLOW THESE INSTRUCTIONS MAY RESULT IN THE CANCELLATION OF YOUR SURGERY PATIENT SIGNATURE_________________________________  NURSE SIGNATURE__________________________________  ________________________________________________________________________

## 2021-03-09 ENCOUNTER — Encounter (HOSPITAL_COMMUNITY): Payer: Self-pay

## 2021-03-09 ENCOUNTER — Other Ambulatory Visit: Payer: Self-pay

## 2021-03-09 ENCOUNTER — Ambulatory Visit (HOSPITAL_COMMUNITY)
Admission: RE | Admit: 2021-03-09 | Discharge: 2021-03-09 | Disposition: A | Discharge status: Home / Self Care | Payer: PPO | Source: Ambulatory Visit | Attending: Anesthesiology | Admitting: Anesthesiology

## 2021-03-09 ENCOUNTER — Encounter (HOSPITAL_COMMUNITY)
Admission: RE | Admit: 2021-03-09 | Discharge: 2021-03-09 | Disposition: A | Discharge status: Home / Self Care | Payer: PPO | Source: Ambulatory Visit | Attending: Surgery | Admitting: Surgery

## 2021-03-09 DIAGNOSIS — E039 Hypothyroidism, unspecified: Secondary | ICD-10-CM | POA: Diagnosis not present

## 2021-03-09 DIAGNOSIS — F324 Major depressive disorder, single episode, in partial remission: Secondary | ICD-10-CM | POA: Diagnosis not present

## 2021-03-09 DIAGNOSIS — E7800 Pure hypercholesterolemia, unspecified: Secondary | ICD-10-CM | POA: Diagnosis not present

## 2021-03-09 DIAGNOSIS — Z01818 Encounter for other preprocedural examination: Secondary | ICD-10-CM | POA: Insufficient documentation

## 2021-03-09 DIAGNOSIS — F331 Major depressive disorder, recurrent, moderate: Secondary | ICD-10-CM | POA: Diagnosis not present

## 2021-03-09 DIAGNOSIS — I10 Essential (primary) hypertension: Secondary | ICD-10-CM | POA: Diagnosis not present

## 2021-03-09 DIAGNOSIS — M8580 Other specified disorders of bone density and structure, unspecified site: Secondary | ICD-10-CM | POA: Diagnosis not present

## 2021-03-09 DIAGNOSIS — G4700 Insomnia, unspecified: Secondary | ICD-10-CM | POA: Diagnosis not present

## 2021-03-09 DIAGNOSIS — K219 Gastro-esophageal reflux disease without esophagitis: Secondary | ICD-10-CM | POA: Diagnosis not present

## 2021-03-09 HISTORY — DX: Prediabetes: R73.03

## 2021-03-09 LAB — CBC
HCT: 40.7 % (ref 36.0–46.0)
Hemoglobin: 12.4 g/dL (ref 12.0–15.0)
MCH: 27.1 pg (ref 26.0–34.0)
MCHC: 30.5 g/dL (ref 30.0–36.0)
MCV: 89.1 fL (ref 80.0–100.0)
Platelets: 366 10*3/uL (ref 150–400)
RBC: 4.57 MIL/uL (ref 3.87–5.11)
RDW: 15.6 % — ABNORMAL HIGH (ref 11.5–15.5)
WBC: 6.2 10*3/uL (ref 4.0–10.5)
nRBC: 0 % (ref 0.0–0.2)

## 2021-03-09 LAB — BASIC METABOLIC PANEL WITH GFR
Anion gap: 9 (ref 5–15)
BUN: 15 mg/dL (ref 8–23)
CO2: 25 mmol/L (ref 22–32)
Calcium: 9.5 mg/dL (ref 8.9–10.3)
Chloride: 106 mmol/L (ref 98–111)
Creatinine, Ser: 0.76 mg/dL (ref 0.44–1.00)
GFR, Estimated: >60 mL/min
Glucose, Bld: 92 mg/dL (ref 70–99)
Potassium: 4.3 mmol/L (ref 3.5–5.1)
Sodium: 140 mmol/L (ref 135–145)

## 2021-03-10 DIAGNOSIS — M6281 Muscle weakness (generalized): Secondary | ICD-10-CM | POA: Diagnosis not present

## 2021-03-10 DIAGNOSIS — M25611 Stiffness of right shoulder, not elsewhere classified: Secondary | ICD-10-CM | POA: Diagnosis not present

## 2021-03-10 DIAGNOSIS — M25511 Pain in right shoulder: Secondary | ICD-10-CM | POA: Diagnosis not present

## 2021-03-10 DIAGNOSIS — Z9889 Other specified postprocedural states: Secondary | ICD-10-CM | POA: Diagnosis not present

## 2021-03-10 DIAGNOSIS — F4310 Post-traumatic stress disorder, unspecified: Secondary | ICD-10-CM | POA: Diagnosis not present

## 2021-03-10 LAB — HEMOGLOBIN A1C
Hgb A1c MFr Bld: 5.7 % — ABNORMAL HIGH (ref 4.8–5.6)
Mean Plasma Glucose: 117 mg/dL

## 2021-03-11 DIAGNOSIS — Z9889 Other specified postprocedural states: Secondary | ICD-10-CM | POA: Diagnosis not present

## 2021-03-12 ENCOUNTER — Other Ambulatory Visit (HOSPITAL_COMMUNITY)
Admission: RE | Admit: 2021-03-12 | Discharge: 2021-03-12 | Disposition: A | Discharge status: Home / Self Care | Payer: PPO | Source: Ambulatory Visit | Attending: Surgery | Admitting: Surgery

## 2021-03-12 DIAGNOSIS — Z20822 Contact with and (suspected) exposure to covid-19: Secondary | ICD-10-CM | POA: Diagnosis not present

## 2021-03-12 DIAGNOSIS — Z01812 Encounter for preprocedural laboratory examination: Secondary | ICD-10-CM | POA: Diagnosis not present

## 2021-03-12 LAB — SARS CORONAVIRUS 2 (TAT 6-24 HRS): SARS Coronavirus 2: NEGATIVE

## 2021-03-13 DIAGNOSIS — M6281 Muscle weakness (generalized): Secondary | ICD-10-CM | POA: Diagnosis not present

## 2021-03-13 DIAGNOSIS — M25511 Pain in right shoulder: Secondary | ICD-10-CM | POA: Diagnosis not present

## 2021-03-13 DIAGNOSIS — M25611 Stiffness of right shoulder, not elsewhere classified: Secondary | ICD-10-CM | POA: Diagnosis not present

## 2021-03-13 DIAGNOSIS — Z9889 Other specified postprocedural states: Secondary | ICD-10-CM | POA: Diagnosis not present

## 2021-03-15 ENCOUNTER — Encounter (HOSPITAL_COMMUNITY): Payer: Self-pay | Admitting: Surgery

## 2021-03-15 DIAGNOSIS — E041 Nontoxic single thyroid nodule: Secondary | ICD-10-CM | POA: Diagnosis present

## 2021-03-15 DIAGNOSIS — D440 Neoplasm of uncertain behavior of thyroid gland: Secondary | ICD-10-CM | POA: Diagnosis present

## 2021-03-15 NOTE — H&P (Signed)
General Surgery [facility]  [name] [name] DOB: [birth year] Married / Language: [name] [name] / Race: White Female   History of Present Illness   The patient is a 70 year old female who presents with a thyroid nodule.  CHIEF COMPLAINT: left thyroid neoplasm of uncertain behavior  Patient is referred by Dr. Maury Dus for surgical evaluation and management of a newly diagnosed thyroid neoplasm of uncertain behavior.  Patient had undergone a CT scan of the neck for unrelated causes.  An incidental finding was made of a large left-sided thyroid mass.  Patient has a history of thyroid surgery dating back to 1979 when she underwent right thyroid lobectomy at [facility] for benign disease.  Patient had an ultrasound performed on December 10, 2020.  This showed an enlarged left thyroid lobe measuring 6.9 x 3.3 x 3.5 cm containing a solitary solid nodule measuring 5.6 cm in greatest dimension.  Fine-needle aspiration biopsy was performed on Dec 24, 2020 and showed cytologic atypia, Bethesda category III.  The sample was sent for molecular genetic testing, AFIRMA, and returned with the result of suspicious, rendering a risk of malignancy of 50%.  The patient is therefore referred to surgery for resection for definitive diagnosis and management.  Patient does take levothyroxine.  She denies any complications following her initial thyroid surgery.  She presents today accompanied by her husband.  There is no family history of thyroid malignancy or other endocrine neoplasms.   Problem List/Past Medical NEOPLASM OF UNCERTAIN BEHAVIOR OF THYROID GLAND (D44.0)   POSTOP CHECK ([id number])   Continues to slowly improve. Follow-up in 3 months to reassess, if unchanged will ultrasound and possibly aspirate. ENLARGED THYROID (E04.9)   LEFT THYROID NODULE (E04.1)    Past Surgical History  Foot Surgery   Left. Knee Surgery   Right. Oral Surgery   Thyroid Surgery   Ventral / Umbilical  Hernia Surgery   Left. multiple  Diagnostic Studies History  Colonoscopy   1-5 years ago Mammogram   1-3 years ago Pap Smear   1-5 years ago  Allergies Ceclor *CEPHALOSPORINS*   Bactrim *ANTI-INFECTIVE AGENTS - MISC.*   Gabapentin & Diet Manage Prod *ANTICONVULSANTS*   Allergies Reconciled    Medication History  buPROPion HCl ER (XL)  ('150MG'$  Tablet ER 24HR, Oral) Active. DULoxetine HCl  ('60MG'$  Capsule DR Part, 120 Oral) Active. Irbesartan  ('150MG'$  Tablet, 75 Oral) Active. Levothyroxine Sodium  (125MCG Tablet, Oral) Active. Omeprazole  ('20MG'$  Capsule DR, Oral) Active. Simvastatin  ('40MG'$  Tablet, Oral) Active. Vyvanse  ('50MG'$  Capsule, Oral) Active. Medications Reconciled   Social History  Alcohol use   Occasional alcohol use. Caffeine use   Carbonated beverages. No drug use   Tobacco use   Former smoker.  Family History Alcohol Abuse   Father. Breast Cancer   Mother. Heart disease in female family member before age 106   Ovarian Cancer   Mother. Respiratory Condition   Daughter.  Pregnancy / Birth History  Age at menarche   3 years. Age of menopause   2-50 Gravida   3 Length (months) of breastfeeding   3-6 Maternal age   41-30 Para   2  Other Problems  Anxiety Disorder   Bladder Problems   Depression   Gastroesophageal Reflux Disease   Hemorrhoids   High blood pressure   Hypercholesterolemia   Other disease, cancer, significant illness   Thyroid Disease   Ventral Hernia Repair     Physical Exam  GENERAL APPEARANCE Development:  normal Nutritional status: normal Gross deformities: none  SKIN Rash, lesions, ulcers: none Induration, erythema: none Nodules: none palpable  EYES Conjunctiva and lids: normal Pupils: equal and reactive Iris: normal bilaterally  EARS, NOSE, MOUTH, THROAT External ears: no lesion or deformity External nose: no lesion or deformity Hearing: grossly normal Due to Covid-19 pandemic, patient is wearing a mask.  NECK Symmetric:  yes Trachea: midline Thyroid: Well-healed anterior cervical incision. Palpation reveals no nodularity or masses in the right thyroid bed. Palpation on the left side with swallowing maneuver reveals a relatively firm smooth mass in the left thyroid lobe which extends beneath the clavicle. There is no associated lymphadenopathy.  CHEST Respiratory effort: normal Retraction or accessory muscle use: no Breath sounds: normal bilaterally Rales, rhonchi, wheeze: none  CARDIOVASCULAR Auscultation: regular rhythm, normal rate Murmurs: none Pulses: radial pulse 2+ palpable Lower extremity  MUSCULOSKELETAL Station and gait: normal Digits and nails: no clubbing or cyanosis Muscle strength: grossly normal all extremities Range of motion: grossly normal all extremities Deformity: none  LYMPHATIC Cervical: none palpable Supraclavicular: none palpable  PSYCHIATRIC Oriented to person, place, and time: yes Mood and affect: normal for situation Judgment and insight: appropriate for situation    Assessment & Plan   NEOPLASM OF UNCERTAIN BEHAVIOR OF THYROID GLAND (D44.0) LEFT THYROID NODULE (E04.1) ENLARGED THYROID (E04.9)  Patient is referred by her primary care physician for surgical evaluation and management of a newly diagnosed thyroid neoplasm of uncertain behavior.   Patient provided with a copy of "The Thyroid Book: Medical and Surgical Treatment of Thyroid Problems", published by [employer], 16 pages.  Book reviewed and explained to patient during visit today.  Patient has a large mass occupying the left thyroid lobe.  Molecular genetic testing renders a risk of malignancy of 50%.  Patient has had a prior right thyroid lobectomy.  I have recommended proceeding with completion thyroidectomy.  We discussed the procedure.  We discussed the hospital stay.  We discussed the size and location of the surgical incision.  We discussed the risk of surgery including the risk of recurrent laryngeal  nerve injury and injury to parathyroid glands.  We discussed the need for lifelong thyroid hormone replacement.  We discussed the potential need for radioactive iodine treatment.  The patient understands and wishes to proceed with surgery in the near future.  The risks and benefits of the procedure have been discussed at length with the patient.  The patient understands the proposed procedure, potential alternative treatments, and the course of recovery to be expected.  All of the patient's questions have been answered at this time.  The patient wishes to proceed with surgery.  Armandina Gemma, MD [facility] Office: [phone]

## 2021-03-16 DIAGNOSIS — M6281 Muscle weakness (generalized): Secondary | ICD-10-CM | POA: Diagnosis not present

## 2021-03-16 DIAGNOSIS — Z9889 Other specified postprocedural states: Secondary | ICD-10-CM | POA: Diagnosis not present

## 2021-03-16 DIAGNOSIS — M25511 Pain in right shoulder: Secondary | ICD-10-CM | POA: Diagnosis not present

## 2021-03-16 DIAGNOSIS — M25611 Stiffness of right shoulder, not elsewhere classified: Secondary | ICD-10-CM | POA: Diagnosis not present

## 2021-03-16 NOTE — Anesthesia Preprocedure Evaluation (Addendum)
Anesthesia Evaluation  Patient identified by MRN, date of birth, ID band Patient awake    Reviewed: Allergy & Precautions, NPO status , Patient's Chart, lab work & pertinent test results  History of Anesthesia Complications Negative for: history of anesthetic complications  Airway Mallampati: II  TM Distance: >3 FB Neck ROM: Full    Dental no notable dental hx.    Pulmonary former smoker,    Pulmonary exam normal        Cardiovascular hypertension, Pt. on medications Normal cardiovascular exam     Neuro/Psych Anxiety Depression    GI/Hepatic Neg liver ROS, GERD  Medicated and Controlled,  Endo/Other  Hypothyroidism Morbid obesity  Renal/GU negative Renal ROS  negative genitourinary   Musculoskeletal  (+) Arthritis ,   Abdominal   Peds  Hematology negative hematology ROS (+)   Anesthesia Other Findings Thyroid ca  Reproductive/Obstetrics negative OB ROS                            Anesthesia Physical Anesthesia Plan  ASA: 3  Anesthesia Plan: General   Post-op Pain Management:    Induction: Intravenous  PONV Risk Score and Plan: 3 and Treatment may vary due to age or medical condition, Dexamethasone and Ondansetron  Airway Management Planned: Oral ETT  Additional Equipment: None  Intra-op Plan:   Post-operative Plan: Extubation in OR  Informed Consent: I have reviewed the patients History and Physical, chart, labs and discussed the procedure including the risks, benefits and alternatives for the proposed anesthesia with the patient or authorized representative who has indicated his/her understanding and acceptance.     Dental advisory given  Plan Discussed with: CRNA  Anesthesia Plan Comments:        Anesthesia Quick Evaluation

## 2021-03-17 ENCOUNTER — Other Ambulatory Visit: Payer: Self-pay

## 2021-03-17 ENCOUNTER — Ambulatory Visit (HOSPITAL_COMMUNITY): Payer: PPO | Admitting: Certified Registered Nurse Anesthetist

## 2021-03-17 ENCOUNTER — Encounter (HOSPITAL_COMMUNITY)
Admission: RE | Disposition: A | Discharge status: Home / Self Care | Payer: Self-pay | Source: Home / Self Care | Attending: Surgery

## 2021-03-17 ENCOUNTER — Encounter (HOSPITAL_COMMUNITY): Payer: Self-pay | Admitting: Surgery

## 2021-03-17 ENCOUNTER — Ambulatory Visit (HOSPITAL_COMMUNITY)
Admission: RE | Admit: 2021-03-17 | Discharge: 2021-03-18 | Disposition: A | Discharge status: Home / Self Care | Payer: PPO | Attending: Surgery | Admitting: Surgery

## 2021-03-17 DIAGNOSIS — Z836 Family history of other diseases of the respiratory system: Secondary | ICD-10-CM | POA: Diagnosis not present

## 2021-03-17 DIAGNOSIS — K56609 Unspecified intestinal obstruction, unspecified as to partial versus complete obstruction: Secondary | ICD-10-CM | POA: Diagnosis not present

## 2021-03-17 DIAGNOSIS — E890 Postprocedural hypothyroidism: Secondary | ICD-10-CM | POA: Insufficient documentation

## 2021-03-17 DIAGNOSIS — Z803 Family history of malignant neoplasm of breast: Secondary | ICD-10-CM | POA: Insufficient documentation

## 2021-03-17 DIAGNOSIS — Z87891 Personal history of nicotine dependence: Secondary | ICD-10-CM | POA: Diagnosis not present

## 2021-03-17 DIAGNOSIS — K219 Gastro-esophageal reflux disease without esophagitis: Secondary | ICD-10-CM | POA: Diagnosis not present

## 2021-03-17 DIAGNOSIS — Z8249 Family history of ischemic heart disease and other diseases of the circulatory system: Secondary | ICD-10-CM | POA: Insufficient documentation

## 2021-03-17 DIAGNOSIS — D440 Neoplasm of uncertain behavior of thyroid gland: Secondary | ICD-10-CM | POA: Diagnosis present

## 2021-03-17 DIAGNOSIS — Z8041 Family history of malignant neoplasm of ovary: Secondary | ICD-10-CM | POA: Diagnosis not present

## 2021-03-17 DIAGNOSIS — E063 Autoimmune thyroiditis: Secondary | ICD-10-CM | POA: Insufficient documentation

## 2021-03-17 DIAGNOSIS — Z79899 Other long term (current) drug therapy: Secondary | ICD-10-CM | POA: Diagnosis not present

## 2021-03-17 DIAGNOSIS — Z888 Allergy status to other drugs, medicaments and biological substances status: Secondary | ICD-10-CM | POA: Diagnosis not present

## 2021-03-17 DIAGNOSIS — E041 Nontoxic single thyroid nodule: Secondary | ICD-10-CM | POA: Diagnosis not present

## 2021-03-17 DIAGNOSIS — N840 Polyp of corpus uteri: Secondary | ICD-10-CM | POA: Diagnosis not present

## 2021-03-17 DIAGNOSIS — Z881 Allergy status to other antibiotic agents status: Secondary | ICD-10-CM | POA: Diagnosis not present

## 2021-03-17 DIAGNOSIS — Z79890 Hormone replacement therapy (postmenopausal): Secondary | ICD-10-CM | POA: Diagnosis not present

## 2021-03-17 HISTORY — PX: THYROIDECTOMY: SHX17

## 2021-03-17 SURGERY — THYROIDECTOMY
Anesthesia: General | Site: Neck | Laterality: Left

## 2021-03-17 MED ORDER — FENTANYL CITRATE (PF) 100 MCG/2ML IJ SOLN
INTRAMUSCULAR | Status: AC
  Filled 2021-03-17: qty 2

## 2021-03-17 MED ORDER — CHLORHEXIDINE GLUCONATE CLOTH 2 % EX PADS: 6.0000 | MEDICATED_PAD | Freq: Once | CUTANEOUS | Status: DC

## 2021-03-17 MED ORDER — DEXAMETHASONE SODIUM PHOSPHATE 4 MG/ML IJ SOLN
INTRAMUSCULAR | Status: DC | PRN
  Administered 2021-03-17: 5 mg via INTRAVENOUS

## 2021-03-17 MED ORDER — LIDOCAINE 2% (20 MG/ML) 5 ML SYRINGE
INTRAMUSCULAR | Status: AC
  Filled 2021-03-17: qty 5

## 2021-03-17 MED ORDER — ONDANSETRON HCL 4 MG/2ML IJ SOLN
INTRAMUSCULAR | Status: AC
  Filled 2021-03-17: qty 2

## 2021-03-17 MED ORDER — OXYCODONE HCL 5 MG PO TABS
5.0000 mg | ORAL_TABLET | Freq: Once | ORAL | Status: AC | PRN
  Administered 2021-03-17: 5 mg via ORAL

## 2021-03-17 MED ORDER — TRAMADOL HCL 50 MG PO TABS: 50.0000 mg | ORAL_TABLET | Freq: Four times a day (QID) | ORAL | Status: DC | PRN

## 2021-03-17 MED ORDER — PROPOFOL 10 MG/ML IV BOLUS
INTRAVENOUS | Status: DC | PRN
  Administered 2021-03-17: 200 mg via INTRAVENOUS

## 2021-03-17 MED ORDER — LIDOCAINE 2% (20 MG/ML) 5 ML SYRINGE
INTRAMUSCULAR | Status: DC | PRN
  Administered 2021-03-17: 100 mg via INTRAVENOUS

## 2021-03-17 MED ORDER — PHENYLEPHRINE 40 MCG/ML (10ML) SYRINGE FOR IV PUSH (FOR BLOOD PRESSURE SUPPORT)
PREFILLED_SYRINGE | INTRAVENOUS | Status: AC
  Filled 2021-03-17: qty 10

## 2021-03-17 MED ORDER — SODIUM CHLORIDE 0.45 % IV SOLN: INTRAVENOUS | Status: DC

## 2021-03-17 MED ORDER — CIPROFLOXACIN IN D5W 400 MG/200ML IV SOLN
400.0000 mg | INTRAVENOUS | Status: AC
  Administered 2021-03-17: 400 mg via INTRAVENOUS
  Filled 2021-03-17: qty 200

## 2021-03-17 MED ORDER — HEMOSTATIC AGENTS (NO CHARGE) OPTIME
TOPICAL | Status: DC | PRN
  Administered 2021-03-17: 1 via TOPICAL

## 2021-03-17 MED ORDER — OXYCODONE HCL 5 MG PO TABS
ORAL_TABLET | ORAL | Status: AC
  Filled 2021-03-17: qty 1

## 2021-03-17 MED ORDER — 0.9 % SODIUM CHLORIDE (POUR BTL) OPTIME
TOPICAL | Status: DC | PRN
  Administered 2021-03-17: 1000 mL

## 2021-03-17 MED ORDER — ACETAMINOPHEN 500 MG PO TABS
1000.0000 mg | ORAL_TABLET | Freq: Once | ORAL | Status: AC
  Administered 2021-03-17: 1000 mg via ORAL
  Filled 2021-03-17: qty 2

## 2021-03-17 MED ORDER — PROMETHAZINE HCL 25 MG/ML IJ SOLN: 6.2500 mg | INTRAMUSCULAR | Status: DC | PRN

## 2021-03-17 MED ORDER — IRBESARTAN 75 MG PO TABS
75.0000 mg | ORAL_TABLET | Freq: Every morning | ORAL | Status: DC
  Administered 2021-03-17 – 2021-03-18 (×2): 75 mg via ORAL
  Filled 2021-03-17 (×2): qty 1

## 2021-03-17 MED ORDER — ONDANSETRON HCL 4 MG/2ML IJ SOLN
INTRAMUSCULAR | Status: DC | PRN
Start: 2021-03-17 — End: 2021-03-17
  Administered 2021-03-17: 4 mg via INTRAVENOUS

## 2021-03-17 MED ORDER — PROPOFOL 10 MG/ML IV BOLUS
INTRAVENOUS | Status: AC
  Filled 2021-03-17: qty 20

## 2021-03-17 MED ORDER — CHLORHEXIDINE GLUCONATE 0.12 % MT SOLN
15.0000 mL | Freq: Once | OROMUCOSAL | Status: AC
  Administered 2021-03-17: 15 mL via OROMUCOSAL

## 2021-03-17 MED ORDER — FENTANYL CITRATE (PF) 100 MCG/2ML IJ SOLN
INTRAMUSCULAR | Status: DC | PRN
  Administered 2021-03-17 (×3): 50 ug via INTRAVENOUS
  Administered 2021-03-17: 100 ug via INTRAVENOUS

## 2021-03-17 MED ORDER — FENTANYL CITRATE (PF) 250 MCG/5ML IJ SOLN
INTRAMUSCULAR | Status: AC
  Filled 2021-03-17: qty 5

## 2021-03-17 MED ORDER — LACTATED RINGERS IV SOLN: INTRAVENOUS | Status: DC

## 2021-03-17 MED ORDER — BUPROPION HCL ER (XL) 150 MG PO TB24
150.0000 mg | ORAL_TABLET | Freq: Every morning | ORAL | Status: DC
  Administered 2021-03-18: 150 mg via ORAL
  Filled 2021-03-17: qty 1

## 2021-03-17 MED ORDER — OXYCODONE HCL 5 MG PO TABS
5.0000 mg | ORAL_TABLET | ORAL | Status: DC | PRN
  Administered 2021-03-17: 5 mg via ORAL
  Filled 2021-03-17: qty 2

## 2021-03-17 MED ORDER — ALPRAZOLAM 0.5 MG PO TABS
0.5000 mg | ORAL_TABLET | Freq: Every day | ORAL | Status: DC | PRN
  Administered 2021-03-18: 0.5 mg via ORAL
  Filled 2021-03-17: qty 1

## 2021-03-17 MED ORDER — ONDANSETRON 4 MG PO TBDP: 4.0000 mg | ORAL_TABLET | Freq: Four times a day (QID) | ORAL | Status: DC | PRN

## 2021-03-17 MED ORDER — ONDANSETRON HCL 4 MG/2ML IJ SOLN: 4.0000 mg | Freq: Four times a day (QID) | INTRAMUSCULAR | Status: DC | PRN

## 2021-03-17 MED ORDER — LEVOTHYROXINE SODIUM 125 MCG PO TABS
125.0000 ug | ORAL_TABLET | Freq: Every day | ORAL | Status: DC
  Administered 2021-03-18: 125 ug via ORAL
  Filled 2021-03-17: qty 1

## 2021-03-17 MED ORDER — ROCURONIUM BROMIDE 10 MG/ML (PF) SYRINGE
PREFILLED_SYRINGE | INTRAVENOUS | Status: AC
  Filled 2021-03-17: qty 10

## 2021-03-17 MED ORDER — FENTANYL CITRATE (PF) 100 MCG/2ML IJ SOLN: 25.0000 ug | INTRAMUSCULAR | Status: DC | PRN

## 2021-03-17 MED ORDER — ORAL CARE MOUTH RINSE: 15.0000 mL | Freq: Once | OROMUCOSAL | Status: AC

## 2021-03-17 MED ORDER — HYDROMORPHONE HCL 1 MG/ML IJ SOLN: 1.0000 mg | INTRAMUSCULAR | Status: DC | PRN

## 2021-03-17 MED ORDER — ACETAMINOPHEN 325 MG PO TABS: 650.0000 mg | ORAL_TABLET | Freq: Four times a day (QID) | ORAL | Status: DC | PRN

## 2021-03-17 MED ORDER — PHENYLEPHRINE 40 MCG/ML (10ML) SYRINGE FOR IV PUSH (FOR BLOOD PRESSURE SUPPORT)
PREFILLED_SYRINGE | INTRAVENOUS | Status: DC | PRN
  Administered 2021-03-17 (×2): 80 ug via INTRAVENOUS

## 2021-03-17 MED ORDER — ACETAMINOPHEN 650 MG RE SUPP: 650.0000 mg | Freq: Four times a day (QID) | RECTAL | Status: DC | PRN

## 2021-03-17 MED ORDER — OXYCODONE HCL 5 MG/5ML PO SOLN: 5.0000 mg | Freq: Once | ORAL | Status: AC | PRN

## 2021-03-17 MED ORDER — PANTOPRAZOLE SODIUM 40 MG PO TBEC
40.0000 mg | DELAYED_RELEASE_TABLET | Freq: Every day | ORAL | Status: DC
  Administered 2021-03-18: 40 mg via ORAL
  Filled 2021-03-17: qty 1

## 2021-03-17 MED ORDER — ROCURONIUM BROMIDE 10 MG/ML (PF) SYRINGE
PREFILLED_SYRINGE | INTRAVENOUS | Status: DC | PRN
  Administered 2021-03-17: 60 mg via INTRAVENOUS

## 2021-03-17 MED ORDER — DULOXETINE HCL 60 MG PO CPEP
120.0000 mg | ORAL_CAPSULE | Freq: Every morning | ORAL | Status: DC
  Administered 2021-03-18: 120 mg via ORAL
  Filled 2021-03-17: qty 2

## 2021-03-17 MED ORDER — LISDEXAMFETAMINE DIMESYLATE 50 MG PO CAPS: 50.0000 mg | ORAL_CAPSULE | Freq: Every morning | ORAL | Status: DC

## 2021-03-17 MED ORDER — MENTHOL 3 MG MT LOZG
1.0000 | LOZENGE | OROMUCOSAL | Status: DC | PRN
  Filled 2021-03-17: qty 9

## 2021-03-17 MED ORDER — SUGAMMADEX SODIUM 200 MG/2ML IV SOLN
INTRAVENOUS | Status: DC | PRN
  Administered 2021-03-17: 250 mg via INTRAVENOUS

## 2021-03-17 MED ORDER — DEXAMETHASONE SODIUM PHOSPHATE 10 MG/ML IJ SOLN
INTRAMUSCULAR | Status: AC
  Filled 2021-03-17: qty 1

## 2021-03-17 MED ORDER — MIRABEGRON ER 25 MG PO TB24
50.0000 mg | ORAL_TABLET | Freq: Every morning | ORAL | Status: DC
  Administered 2021-03-18: 50 mg via ORAL
  Filled 2021-03-17: qty 2

## 2021-03-17 SURGICAL SUPPLY — 34 items
ADH SKN CLS APL DERMABOND .7 (GAUZE/BANDAGES/DRESSINGS) ×1
APL PRP STRL LF DISP 70% ISPRP (MISCELLANEOUS) ×1
ATTRACTOMAT 16X20 MAGNETIC DRP (DRAPES) ×2 IMPLANT
BAG COUNTER SPONGE SURGICOUNT (BAG) ×2 IMPLANT
BAG SPNG CNTER NS LX DISP (BAG) ×1
BLADE SURG 15 STRL LF DISP TIS (BLADE) ×1 IMPLANT
BLADE SURG 15 STRL SS (BLADE) ×2
CHLORAPREP W/TINT 26 (MISCELLANEOUS) ×2 IMPLANT
CLIP TI MEDIUM 6 (CLIP) ×5 IMPLANT
CLIP TI WIDE RED SMALL 6 (CLIP) ×4 IMPLANT
COVER SURGICAL LIGHT HANDLE (MISCELLANEOUS) ×2 IMPLANT
DERMABOND ADVANCED (GAUZE/BANDAGES/DRESSINGS) ×1
DERMABOND ADVANCED .7 DNX12 (GAUZE/BANDAGES/DRESSINGS) ×1 IMPLANT
DRAPE LAPAROTOMY T 98X78 PEDS (DRAPES) ×2 IMPLANT
DRAPE UTILITY XL STRL (DRAPES) ×2 IMPLANT
ELECT PENCIL ROCKER SW 15FT (MISCELLANEOUS) ×2 IMPLANT
ELECT REM PT RETURN 15FT ADLT (MISCELLANEOUS) ×2 IMPLANT
GAUZE 4X4 16PLY ~~LOC~~+RFID DBL (SPONGE) ×2 IMPLANT
GLOVE SURG SYN 7.5  E (GLOVE) ×4
GLOVE SURG SYN 7.5 E (GLOVE) ×2 IMPLANT
GLOVE SURG SYN 7.5 PF PI (GLOVE) ×2 IMPLANT
GOWN STRL REUS W/TWL XL LVL3 (GOWN DISPOSABLE) ×4 IMPLANT
HEMOSTAT SURGICEL 2X4 FIBR (HEMOSTASIS) ×2 IMPLANT
ILLUMINATOR WAVEGUIDE N/F (MISCELLANEOUS) ×2 IMPLANT
KIT BASIN OR (CUSTOM PROCEDURE TRAY) ×2 IMPLANT
KIT TURNOVER KIT A (KITS) ×2 IMPLANT
PACK BASIC VI WITH GOWN DISP (CUSTOM PROCEDURE TRAY) ×2 IMPLANT
SHEARS HARMONIC 9CM CVD (BLADE) ×2 IMPLANT
SUT MNCRL AB 4-0 PS2 18 (SUTURE) ×2 IMPLANT
SUT VIC AB 3-0 SH 18 (SUTURE) ×4 IMPLANT
SYR BULB IRRIG 60ML STRL (SYRINGE) ×2 IMPLANT
TOWEL OR 17X26 10 PK STRL BLUE (TOWEL DISPOSABLE) ×2 IMPLANT
TOWEL OR NON WOVEN STRL DISP B (DISPOSABLE) ×2 IMPLANT
TUBING CONNECTING 10 (TUBING) ×2 IMPLANT

## 2021-03-17 NOTE — Anesthesia Procedure Notes (Signed)
Procedure Name: Intubation Date/Time: 03/17/2021 7:23 AM Performed by: Claudia Desanctis, CRNA Pre-anesthesia Checklist: Patient identified, Emergency Drugs available, Suction available and Patient being monitored Patient Re-evaluated:Patient Re-evaluated prior to induction Oxygen Delivery Method: Circle system utilized Preoxygenation: Pre-oxygenation with 100% oxygen Induction Type: IV induction Ventilation: Mask ventilation without difficulty Laryngoscope Size: 2 and Miller Grade View: Grade I Tube type: Oral Tube size: 7.0 mm Number of attempts: 1 Airway Equipment and Method: Stylet Placement Confirmation: ETT inserted through vocal cords under direct vision, positive ETCO2 and breath sounds checked- equal and bilateral Secured at: 21 cm Tube secured with: Tape Dental Injury: Teeth and Oropharynx as per pre-operative assessment

## 2021-03-17 NOTE — Transfer of Care (Signed)
Immediate Anesthesia Transfer of Care Note  Patient: [name] [name]  Procedure(s) Performed: COMPLETION THYROIDECTOMY, LEFT LOBE (Left: Neck)  Patient Location: PACU  Anesthesia Type:General  Level of Consciousness: awake, alert , oriented and patient cooperative  Airway & Oxygen Therapy: Patient Spontanous Breathing and Patient connected to face mask  Post-op Assessment: Report given to RN and Post -op Vital signs reviewed and stable  Post vital signs: Reviewed and stable  Last Vitals:  Vitals Value Taken Time  BP 120/53 03/17/21 0852  Temp    Pulse 56 03/17/21 0856  Resp 14 03/17/21 0856  SpO2 100 % 03/17/21 0856  Vitals shown include unvalidated device data.  Last Pain:  Vitals:   03/17/21 0604  TempSrc: Oral  PainSc:       Patients Stated Pain Goal: 2 ([id number])  Complications: No notable events documented.

## 2021-03-17 NOTE — Anesthesia Postprocedure Evaluation (Signed)
Anesthesia Post Note  Patient: [name] [name]  Procedure(s) Performed: COMPLETION THYROIDECTOMY, LEFT LOBE (Left: Neck)     Patient location during evaluation: PACU Anesthesia Type: General Level of consciousness: awake and alert and oriented Pain management: pain level controlled Vital Signs Assessment: post-procedure vital signs reviewed and stable Respiratory status: spontaneous breathing, nonlabored ventilation and respiratory function stable Cardiovascular status: blood pressure returned to baseline Postop Assessment: no apparent nausea or vomiting Anesthetic complications: no   No notable events documented.  Last Vitals:  Vitals:   03/17/21 0915 03/17/21 0930  BP: 121/89 (!) 159/93  Pulse: 60 65  Resp: 15 16  Temp:  36.5 C  SpO2: 97% 99%    Last Pain:  Vitals:   03/17/21 0930  TempSrc:   PainSc: [place]

## 2021-03-17 NOTE — Interval H&P Note (Signed)
History and Physical Interval Note:  03/17/2021 6:56 AM  [name] [name]  has presented today for surgery, with the diagnosis of THRYOID NEOPLASM OF UNCERTAIN BEHAVIOR LEFT THYROID NODULE.  The various methods of treatment have been discussed with the patient and family. After consideration of risks, benefits and other options for treatment, the patient has consented to    Procedure(s): COMPLETE THYROIDECTOMY LEFT LOBE (Left) as a surgical intervention.    The patient's history has been reviewed, patient examined, no change in status, stable for surgery.  I have reviewed the patient's chart and labs.  Questions were answered to the patient's satisfaction.    Armandina Gemma, MD [facility] Office: [place]

## 2021-03-17 NOTE — Op Note (Signed)
Operative Note  Pre-operative Diagnosis:  thyroid neoplasm of uncertain behavior  Post-operative Diagnosis:  same  Surgeon:  Armandina Gemma, MD  Assistant:  Zacarias Pontes, MD ([facility] Resident)   Procedure:  completion thyroidectomy (left thyroid lobe)  Anesthesia:  general  Estimated Blood Loss:  minimal  Drains: none         Specimen: to pathology  Indications:  Patient is referred by Dr. Maury Dus for surgical evaluation and management of a newly diagnosed thyroid neoplasm of uncertain behavior.  Patient had undergone a CT scan of the neck for unrelated causes.  An incidental finding was made of a large left-sided thyroid mass.  Patient has a history of thyroid surgery dating back to 1979 when she underwent right thyroid lobectomy at [facility] for benign disease.  Patient had an ultrasound performed on December 10, 2020.  This showed an enlarged left thyroid lobe measuring 6.9 x 3.3 x 3.5 cm containing a solitary solid nodule measuring 5.6 cm in greatest dimension.  Fine-needle aspiration biopsy was performed on Dec 24, 2020 and showed cytologic atypia, Bethesda category III.  The sample was sent for molecular genetic testing, AFIRMA, and returned with the result of suspicious, rendering a risk of malignancy of 50%.  The patient is therefore referred to surgery for resection for definitive diagnosis and management.    I was personally present during the key and critical portions of this procedure and immediately available throughout the entire procedure, as documented in my operative note.   Procedure:  The patient was seen in the pre-op holding area. The risks, benefits, complications, treatment options, and expected outcomes were previously discussed with the patient. The patient agreed with the proposed plan and has signed the informed consent form.  The patient was brought to the operating room by the surgical team, identified as [name] and the procedure  verified. A "time out" was completed and the above information confirmed.  Following induction of general endotracheal anesthesia, the patient is positioned and then prepped and draped in the usual aseptic fashion.  After ascertaining that an adequate level of anesthesia been achieved, the patient's previous collar incision is reopened with a #15 blade.  Dissection was carried through subcutaneous tissues and the platysma.  Hemostasis is achieved with the electrocautery.  Skin flaps are elevated cephalad and caudad from the thyroid notch to the sternal notch.  A Mahorner self-retaining retractors placed for exposure.  Strap muscles are incised in the midline.  Trachea is deviated to the right.  There is a palpable mass in the left thyroid bed.  Strap muscles are elevated and reflected to the left exposing an enlarged left thyroid lobe.  There appears to be a round smooth nodular mass measuring approximately 4 cm to 5 cm in size in the anterior and superior portion of the left lobe.  This is gently dissected out.  Vascular tributaries are divided between small and medium ligaclips.  Gland is rolled anteriorly.  Superior parathyroid gland is identified and preserved.  Inferior parathyroid gland is identified and preserved.  Inferior venous tributaries are divided between medium ligaclips with the harmonic scalpel.  Tubercle of Zuckerkandl was carefully dissected out with identification of the recurrent laryngeal nerve just posterior and lateral to the tubercle.  Gland is rolled further anteriorly and the ligament of Gwenlyn Found is released with the electrocautery.  The remaining thyroid tissue was then dissected off of the trachea and the entire left lobe with the dominant mass is excised.  This is submitted in its entirety to pathology for review.  Neck is irrigated with warm saline.  Good hemostasis is noted.  Fibrillar is placed throughout the operative field.  Strap muscles are reapproximated in the midline of  interrupted 3-0 Vicryl sutures.  Platysma was closed with interrupted 3-0 Vicryl sutures.  Skin is closed with a running 4-0 Monocryl subcuticular suture.  Wound is washed and dried and Dermabond is applied as dressing.  Patient is awakened from anesthesia and transported to the recovery room.  The patient tolerated the procedure well.   Armandina Gemma, MD [facility] Office: [phone]

## 2021-03-18 ENCOUNTER — Encounter (HOSPITAL_COMMUNITY): Payer: Self-pay | Admitting: Surgery

## 2021-03-18 DIAGNOSIS — E063 Autoimmune thyroiditis: Secondary | ICD-10-CM | POA: Diagnosis not present

## 2021-03-18 LAB — BASIC METABOLIC PANEL
Anion gap: 6 (ref 5–15)
BUN: 16 mg/dL (ref 8–23)
CO2: 28 mmol/L (ref 22–32)
Calcium: 8 mg/dL — ABNORMAL LOW (ref 8.9–10.3)
Chloride: 106 mmol/L (ref 98–111)
Creatinine, Ser: 0.8 mg/dL (ref 0.44–1.00)
GFR, Estimated: >60 mL/min (ref >60)
Glucose, Bld: 123 mg/dL — ABNORMAL HIGH (ref 70–99)
Potassium: 4.1 mmol/L (ref 3.5–5.1)
Sodium: 140 mmol/L (ref 135–145)

## 2021-03-18 MED ORDER — CALCIUM GLUCONATE-NACL 2-0.675 GM/100ML-% IV SOLN
2.0000 g | INTRAVENOUS | Status: AC
  Administered 2021-03-18: 2000 mg via INTRAVENOUS
  Filled 2021-03-18: qty 100

## 2021-03-18 NOTE — Discharge Instructions (Signed)
[facility]  THYROID & PARATHYROID SURGERY:  POST-OP INSTRUCTIONS  Always review your discharge instruction sheet from the facility where your surgery was performed.  A prescription for pain medication may be given to you upon discharge.  Take your pain medication as prescribed.  If narcotic pain medicine is not needed, then you may take acetaminophen (Tylenol) or ibuprofen (Advil) as needed.  Take your usually prescribed medications unless otherwise directed.  If you need a refill on your pain medication, please contact our office during regular business hours.  Prescriptions cannot be processed by our office after 5 pm or on weekends.  Start with a light diet upon arrival home, such as soup and crackers or toast.  Be sure to drink plenty of fluids daily.  Resume your normal diet the day after surgery.  Most patients will experience some swelling and bruising on the chest and neck area.  Ice packs will help.  Swelling and bruising can take several days to resolve.   It is common to experience some constipation after surgery.  Increasing fluid intake and taking a stool softener (Colace) will usually help or prevent this problem.  A mild laxative (Milk of Magnesia or Miralax) should be taken according to package directions if there has been no bowel movement after 48 hours.  You have steri-strips and a gauze dressing over your incision.  You may remove the gauze bandage on the second day after surgery, and you may shower at that time.  Leave your steri-strips (small skin tapes) in place directly over the incision.  These strips should remain on the skin for 5-7 days and then be removed.  You may get them wet in the shower and pat them dry.  You may resume regular (light) daily activities beginning the next day (such as daily self-care, walking, climbing stairs) gradually increasing activities as tolerated.  You may have sexual intercourse when it is comfortable.  Refrain from  any heavy lifting or straining until approved by your doctor.  You may drive when you no longer are taking prescription pain medication, you can comfortably wear a seatbelt, and you can safely maneuver your car and apply brakes.  You should see your doctor in the office for a follow-up appointment approximately three weeks after your surgery.  Make sure that you call for this appointment within a day or two after you arrive home to insure a convenient appointment time.  WHEN TO CALL YOUR DOCTOR: -- Fever greater than 101.5 -- Inability to urinate -- Nausea and/or vomiting - persistent -- Extreme swelling or bruising -- Continued bleeding from incision -- Increased pain, redness, or drainage from the incision -- Difficulty swallowing or breathing -- Muscle cramping or spasms -- Numbness or tingling in hands or around lips  The clinic staff is available to answer your questions during regular business hours.  Please don't hesitate to call and ask to speak to one of the nurses if you have concerns.  Kobi Mario, MD [facility] Office: [phone] 

## 2021-03-18 NOTE — Plan of Care (Signed)
  Problem: Education: Goal: Knowledge of General Education information will improve Description: Including pain rating scale, medication(s)/side effects and non-pharmacologic comfort measures Outcome: Adequate for Discharge   Problem: Health Behavior/Discharge Planning: Goal: Ability to manage health-related needs will improve Outcome: Adequate for Discharge   Problem: Clinical Measurements: Goal: Ability to maintain clinical measurements within normal limits will improve Outcome: Adequate for Discharge Goal: Will remain free from infection Outcome: Adequate for Discharge Goal: Diagnostic test results will improve Outcome: Adequate for Discharge Goal: Respiratory complications will improve Outcome: Adequate for Discharge Goal: Cardiovascular complication will be avoided Outcome: Adequate for Discharge   Problem: Activity: Goal: Risk for activity intolerance will decrease Outcome: Adequate for Discharge   Problem: Nutrition: Goal: Adequate nutrition will be maintained Outcome: Adequate for Discharge   Problem: Coping: Goal: Level of anxiety will decrease Outcome: Adequate for Discharge   Problem: Elimination: Goal: Will not experience complications related to bowel motility Outcome: Adequate for Discharge Goal: Will not experience complications related to urinary retention Outcome: Adequate for Discharge   Problem: Pain Managment: Goal: General experience of comfort will improve Outcome: Adequate for Discharge   Problem: Safety: Goal: Ability to remain free from injury will improve Outcome: Adequate for Discharge   Problem: Skin Integrity: Goal: Risk for impaired skin integrity will decrease Outcome: Adequate for Discharge   Problem: Education: Goal: Required Educational Video(s) Outcome: Adequate for Discharge   Problem: Clinical Measurements: Goal: Postoperative complications will be avoided or minimized Outcome: Adequate for Discharge   Problem: Skin  Integrity: Goal: Demonstration of wound healing without infection will improve Outcome: Adequate for Discharge    Haydee Salter, RN

## 2021-03-18 NOTE — Discharge Summary (Signed)
Physician Discharge Summary [facility]  Patient ID: [name] [name] MRN: [id number] DOB/AGE: 69/07/1951 69 y.o.  Admit date: 03/17/2021  Discharge date: 03/18/2021  Discharge Diagnoses:  Principal Problem:   Neoplasm of uncertain behavior of thyroid gland Active Problems:   Left thyroid nodule   Discharged Condition: good  Hospital Course: Patient was admitted for observation following thyroid surgery.  Post op course was uncomplicated.  Pain was well controlled.  Tolerated diet.  Post op calcium level on morning following surgery was 8.0 mg/dl.  Patient was given 2 gm IV calcium gluconate prior to discharge home.  Patient was prepared for discharge home on POD#1.   Consults: None  Treatments: surgery: completion thyroidectomy (left lobe)  Discharge Exam: Blood pressure 120/66, pulse 85, temperature 98.1 F (36.7 C), temperature source Oral, resp. rate 18, height 5' 3.5" (1.613 m), weight 118.9 kg, SpO2 98 %. HEENT - clear Neck - wound dry and intact; mild STS; voice normal Chest - clear bilaterally Cor - RRR   Disposition: Home  Discharge Instructions     Diet - low sodium heart healthy   Complete by: As directed    Increase activity slowly   Complete by: As directed    No dressing needed   Complete by: As directed       Allergies as of 03/18/2021       Reactions   Ceclor [cefaclor] Hives, Shortness Of Breath   Bactrim [sulfamethoxazole-trimethoprim] Hives   Gabapentin Swelling        Medication List     TAKE these medications    ALPRAZolam 0.5 MG tablet Commonly known as: XANAX Take 0.5 mg by mouth daily as needed for anxiety.   amoxicillin 500 MG capsule Commonly known as: AMOXIL Take 2,000 mg by mouth See admin instructions. Take 4 capsule (2000 mg) by mouth 1 hour prior to dental procedure.   buPROPion 150 MG 24 hr tablet Commonly known as: WELLBUTRIN XL Take 150 mg by mouth in the morning.   CALCIUM PO Take 2 tablets by  mouth daily.   DULoxetine 60 MG capsule Commonly known as: CYMBALTA Take 120 mg by mouth in the morning.   ferrous sulfate 325 (65 FE) MG tablet Take 325 mg by mouth daily.   hydrocortisone cream 1 % Apply 1 application topically 2 (two) times daily as needed (skin irritation).   ibuprofen 200 MG tablet Commonly known as: ADVIL Take 400-600 mg by mouth daily as needed (pain).   irbesartan 150 MG tablet Commonly known as: AVAPRO Take 75 mg by mouth in the morning.   levothyroxine 125 MCG tablet Commonly known as: SYNTHROID Take 125 mcg by mouth daily before breakfast.   lisdexamfetamine 50 MG capsule Commonly known as: VYVANSE Take 50 mg by mouth in the morning.   MAGNESIUM EXTRA STRENGTH PO Take 1 application by mouth daily as needed (leg cramps). [place] Balm   multivitamin with minerals Tabs tablet Take 1 tablet by mouth daily. Centrum   Myrbetriq 50 MG Tb24 tablet Generic drug: mirabegron ER Take 50 mg by mouth in the morning.   omeprazole 20 MG capsule Commonly known as: PRILOSEC Take 20 mg by mouth daily before breakfast.   simvastatin 40 MG tablet Commonly known as: ZOCOR Take 40 mg by mouth in the morning.   Vitamin D3 125 MCG (5000 UT) Tabs Take 5,000 Units by mouth daily.               Discharge Care  Instructions  (From admission, onward)           Start     Ordered   03/18/21 0000  No dressing needed        03/18/21 [id number]            Follow-up Information     Armandina Gemma, MD. Schedule an appointment as soon as possible for a visit in 3 week(s).   Specialty: General Surgery Why: For wound re-check Contact information: [place] [address] [phone]                 Armandina Gemma, [facility] Office: [phone]   Signed: Armandina Gemma 03/18/2021, 8:23 AM

## 2021-03-20 DIAGNOSIS — E890 Postprocedural hypothyroidism: Secondary | ICD-10-CM | POA: Diagnosis not present

## 2021-03-20 LAB — SURGICAL PATHOLOGY

## 2021-03-20 NOTE — Progress Notes (Signed)
Pathology results are benign!  Good news.  [place], MD [facility] [place] [facility] Office: [phone]

## 2021-03-24 DIAGNOSIS — F4310 Post-traumatic stress disorder, unspecified: Secondary | ICD-10-CM | POA: Diagnosis not present

## 2021-03-24 DIAGNOSIS — M25511 Pain in right shoulder: Secondary | ICD-10-CM | POA: Diagnosis not present

## 2021-03-24 DIAGNOSIS — M25611 Stiffness of right shoulder, not elsewhere classified: Secondary | ICD-10-CM | POA: Diagnosis not present

## 2021-03-24 DIAGNOSIS — Z9889 Other specified postprocedural states: Secondary | ICD-10-CM | POA: Diagnosis not present

## 2021-03-24 DIAGNOSIS — M6281 Muscle weakness (generalized): Secondary | ICD-10-CM | POA: Diagnosis not present

## 2021-03-27 DIAGNOSIS — M6281 Muscle weakness (generalized): Secondary | ICD-10-CM | POA: Diagnosis not present

## 2021-03-27 DIAGNOSIS — Z9889 Other specified postprocedural states: Secondary | ICD-10-CM | POA: Diagnosis not present

## 2021-03-27 DIAGNOSIS — M25611 Stiffness of right shoulder, not elsewhere classified: Secondary | ICD-10-CM | POA: Diagnosis not present

## 2021-03-27 DIAGNOSIS — M25511 Pain in right shoulder: Secondary | ICD-10-CM | POA: Diagnosis not present

## 2021-04-03 DIAGNOSIS — E890 Postprocedural hypothyroidism: Secondary | ICD-10-CM | POA: Diagnosis not present

## 2021-04-13 DIAGNOSIS — Z9889 Other specified postprocedural states: Secondary | ICD-10-CM | POA: Diagnosis not present

## 2021-04-13 DIAGNOSIS — M25511 Pain in right shoulder: Secondary | ICD-10-CM | POA: Diagnosis not present

## 2021-04-13 DIAGNOSIS — M6281 Muscle weakness (generalized): Secondary | ICD-10-CM | POA: Diagnosis not present

## 2021-04-13 DIAGNOSIS — M25611 Stiffness of right shoulder, not elsewhere classified: Secondary | ICD-10-CM | POA: Diagnosis not present

## 2021-04-14 DIAGNOSIS — F331 Major depressive disorder, recurrent, moderate: Secondary | ICD-10-CM | POA: Diagnosis not present

## 2021-04-14 DIAGNOSIS — K219 Gastro-esophageal reflux disease without esophagitis: Secondary | ICD-10-CM | POA: Diagnosis not present

## 2021-04-14 DIAGNOSIS — M8580 Other specified disorders of bone density and structure, unspecified site: Secondary | ICD-10-CM | POA: Diagnosis not present

## 2021-04-14 DIAGNOSIS — F4310 Post-traumatic stress disorder, unspecified: Secondary | ICD-10-CM | POA: Diagnosis not present

## 2021-04-14 DIAGNOSIS — E039 Hypothyroidism, unspecified: Secondary | ICD-10-CM | POA: Diagnosis not present

## 2021-04-14 DIAGNOSIS — E7800 Pure hypercholesterolemia, unspecified: Secondary | ICD-10-CM | POA: Diagnosis not present

## 2021-04-14 DIAGNOSIS — I10 Essential (primary) hypertension: Secondary | ICD-10-CM | POA: Diagnosis not present

## 2021-04-14 DIAGNOSIS — F324 Major depressive disorder, single episode, in partial remission: Secondary | ICD-10-CM | POA: Diagnosis not present

## 2021-04-14 DIAGNOSIS — G4700 Insomnia, unspecified: Secondary | ICD-10-CM | POA: Diagnosis not present

## 2021-04-16 DIAGNOSIS — F900 Attention-deficit hyperactivity disorder, predominantly inattentive type: Secondary | ICD-10-CM | POA: Diagnosis not present

## 2021-04-16 DIAGNOSIS — F330 Major depressive disorder, recurrent, mild: Secondary | ICD-10-CM | POA: Diagnosis not present

## 2021-04-16 DIAGNOSIS — F419 Anxiety disorder, unspecified: Secondary | ICD-10-CM | POA: Diagnosis not present

## 2021-04-17 DIAGNOSIS — M25511 Pain in right shoulder: Secondary | ICD-10-CM | POA: Diagnosis not present

## 2021-04-17 DIAGNOSIS — M6281 Muscle weakness (generalized): Secondary | ICD-10-CM | POA: Diagnosis not present

## 2021-04-17 DIAGNOSIS — Z9889 Other specified postprocedural states: Secondary | ICD-10-CM | POA: Diagnosis not present

## 2021-04-17 DIAGNOSIS — M25611 Stiffness of right shoulder, not elsewhere classified: Secondary | ICD-10-CM | POA: Diagnosis not present

## 2021-04-27 DIAGNOSIS — Z9889 Other specified postprocedural states: Secondary | ICD-10-CM | POA: Diagnosis not present

## 2021-04-27 DIAGNOSIS — M25511 Pain in right shoulder: Secondary | ICD-10-CM | POA: Diagnosis not present

## 2021-04-27 DIAGNOSIS — M6281 Muscle weakness (generalized): Secondary | ICD-10-CM | POA: Diagnosis not present

## 2021-04-27 DIAGNOSIS — M25611 Stiffness of right shoulder, not elsewhere classified: Secondary | ICD-10-CM | POA: Diagnosis not present

## 2021-04-28 DIAGNOSIS — F4310 Post-traumatic stress disorder, unspecified: Secondary | ICD-10-CM | POA: Diagnosis not present

## 2021-04-29 DIAGNOSIS — Z9889 Other specified postprocedural states: Secondary | ICD-10-CM | POA: Diagnosis not present

## 2021-04-29 DIAGNOSIS — M25611 Stiffness of right shoulder, not elsewhere classified: Secondary | ICD-10-CM | POA: Diagnosis not present

## 2021-04-29 DIAGNOSIS — M6281 Muscle weakness (generalized): Secondary | ICD-10-CM | POA: Diagnosis not present

## 2021-04-29 DIAGNOSIS — M25511 Pain in right shoulder: Secondary | ICD-10-CM | POA: Diagnosis not present

## 2021-04-30 DIAGNOSIS — M25611 Stiffness of right shoulder, not elsewhere classified: Secondary | ICD-10-CM | POA: Diagnosis not present

## 2021-04-30 DIAGNOSIS — M25511 Pain in right shoulder: Secondary | ICD-10-CM | POA: Diagnosis not present

## 2021-04-30 DIAGNOSIS — M6281 Muscle weakness (generalized): Secondary | ICD-10-CM | POA: Diagnosis not present

## 2021-04-30 DIAGNOSIS — Z9889 Other specified postprocedural states: Secondary | ICD-10-CM | POA: Diagnosis not present

## 2021-05-04 DIAGNOSIS — M25611 Stiffness of right shoulder, not elsewhere classified: Secondary | ICD-10-CM | POA: Diagnosis not present

## 2021-05-04 DIAGNOSIS — Z9889 Other specified postprocedural states: Secondary | ICD-10-CM | POA: Diagnosis not present

## 2021-05-04 DIAGNOSIS — M25511 Pain in right shoulder: Secondary | ICD-10-CM | POA: Diagnosis not present

## 2021-05-04 DIAGNOSIS — Z09 Encounter for follow-up examination after completed treatment for conditions other than malignant neoplasm: Secondary | ICD-10-CM | POA: Diagnosis not present

## 2021-05-04 DIAGNOSIS — M6281 Muscle weakness (generalized): Secondary | ICD-10-CM | POA: Diagnosis not present

## 2021-05-06 DIAGNOSIS — Z9889 Other specified postprocedural states: Secondary | ICD-10-CM | POA: Diagnosis not present

## 2021-05-06 DIAGNOSIS — M25611 Stiffness of right shoulder, not elsewhere classified: Secondary | ICD-10-CM | POA: Diagnosis not present

## 2021-05-06 DIAGNOSIS — M25511 Pain in right shoulder: Secondary | ICD-10-CM | POA: Diagnosis not present

## 2021-05-06 DIAGNOSIS — M6281 Muscle weakness (generalized): Secondary | ICD-10-CM | POA: Diagnosis not present

## 2021-05-07 DIAGNOSIS — M25611 Stiffness of right shoulder, not elsewhere classified: Secondary | ICD-10-CM | POA: Diagnosis not present

## 2021-05-07 DIAGNOSIS — Z9889 Other specified postprocedural states: Secondary | ICD-10-CM | POA: Diagnosis not present

## 2021-05-07 DIAGNOSIS — M6281 Muscle weakness (generalized): Secondary | ICD-10-CM | POA: Diagnosis not present

## 2021-05-07 DIAGNOSIS — M25511 Pain in right shoulder: Secondary | ICD-10-CM | POA: Diagnosis not present

## 2021-05-11 DIAGNOSIS — M6281 Muscle weakness (generalized): Secondary | ICD-10-CM | POA: Diagnosis not present

## 2021-05-11 DIAGNOSIS — M25611 Stiffness of right shoulder, not elsewhere classified: Secondary | ICD-10-CM | POA: Diagnosis not present

## 2021-05-11 DIAGNOSIS — Z9889 Other specified postprocedural states: Secondary | ICD-10-CM | POA: Diagnosis not present

## 2021-05-11 DIAGNOSIS — M25511 Pain in right shoulder: Secondary | ICD-10-CM | POA: Diagnosis not present

## 2021-05-13 DIAGNOSIS — M6281 Muscle weakness (generalized): Secondary | ICD-10-CM | POA: Diagnosis not present

## 2021-05-13 DIAGNOSIS — M25611 Stiffness of right shoulder, not elsewhere classified: Secondary | ICD-10-CM | POA: Diagnosis not present

## 2021-05-13 DIAGNOSIS — M25511 Pain in right shoulder: Secondary | ICD-10-CM | POA: Diagnosis not present

## 2021-05-13 DIAGNOSIS — Z9889 Other specified postprocedural states: Secondary | ICD-10-CM | POA: Diagnosis not present

## 2021-05-26 DIAGNOSIS — F4310 Post-traumatic stress disorder, unspecified: Secondary | ICD-10-CM | POA: Diagnosis not present

## 2021-05-27 DIAGNOSIS — M25511 Pain in right shoulder: Secondary | ICD-10-CM | POA: Diagnosis not present

## 2021-06-03 ENCOUNTER — Other Ambulatory Visit: Payer: Self-pay | Admitting: Family Medicine

## 2021-06-03 DIAGNOSIS — E2839 Other primary ovarian failure: Secondary | ICD-10-CM

## 2021-06-05 DIAGNOSIS — K219 Gastro-esophageal reflux disease without esophagitis: Secondary | ICD-10-CM | POA: Diagnosis not present

## 2021-06-05 DIAGNOSIS — F331 Major depressive disorder, recurrent, moderate: Secondary | ICD-10-CM | POA: Diagnosis not present

## 2021-06-05 DIAGNOSIS — M75121 Complete rotator cuff tear or rupture of right shoulder, not specified as traumatic: Secondary | ICD-10-CM | POA: Diagnosis not present

## 2021-06-05 DIAGNOSIS — E7800 Pure hypercholesterolemia, unspecified: Secondary | ICD-10-CM | POA: Diagnosis not present

## 2021-06-05 DIAGNOSIS — M8580 Other specified disorders of bone density and structure, unspecified site: Secondary | ICD-10-CM | POA: Diagnosis not present

## 2021-06-05 DIAGNOSIS — S46011A Strain of muscle(s) and tendon(s) of the rotator cuff of right shoulder, initial encounter: Secondary | ICD-10-CM | POA: Diagnosis not present

## 2021-06-05 DIAGNOSIS — G4700 Insomnia, unspecified: Secondary | ICD-10-CM | POA: Diagnosis not present

## 2021-06-05 DIAGNOSIS — I10 Essential (primary) hypertension: Secondary | ICD-10-CM | POA: Diagnosis not present

## 2021-06-05 DIAGNOSIS — E039 Hypothyroidism, unspecified: Secondary | ICD-10-CM | POA: Diagnosis not present

## 2021-06-09 DIAGNOSIS — F4310 Post-traumatic stress disorder, unspecified: Secondary | ICD-10-CM | POA: Diagnosis not present

## 2021-06-10 ENCOUNTER — Other Ambulatory Visit: Payer: Self-pay | Admitting: Orthopedic Surgery

## 2021-06-10 DIAGNOSIS — Z01811 Encounter for preprocedural respiratory examination: Secondary | ICD-10-CM

## 2021-06-19 ENCOUNTER — Other Ambulatory Visit: Payer: Self-pay | Admitting: Family Medicine

## 2021-06-19 DIAGNOSIS — Z1231 Encounter for screening mammogram for malignant neoplasm of breast: Secondary | ICD-10-CM

## 2021-06-24 DIAGNOSIS — F331 Major depressive disorder, recurrent, moderate: Secondary | ICD-10-CM | POA: Diagnosis not present

## 2021-06-24 DIAGNOSIS — E7800 Pure hypercholesterolemia, unspecified: Secondary | ICD-10-CM | POA: Diagnosis not present

## 2021-06-24 DIAGNOSIS — Z01818 Encounter for other preprocedural examination: Secondary | ICD-10-CM | POA: Diagnosis not present

## 2021-06-24 DIAGNOSIS — Z1211 Encounter for screening for malignant neoplasm of colon: Secondary | ICD-10-CM | POA: Diagnosis not present

## 2021-06-24 DIAGNOSIS — M8580 Other specified disorders of bone density and structure, unspecified site: Secondary | ICD-10-CM | POA: Diagnosis not present

## 2021-06-24 DIAGNOSIS — I10 Essential (primary) hypertension: Secondary | ICD-10-CM | POA: Diagnosis not present

## 2021-06-24 DIAGNOSIS — K219 Gastro-esophageal reflux disease without esophagitis: Secondary | ICD-10-CM | POA: Diagnosis not present

## 2021-06-24 DIAGNOSIS — N3281 Overactive bladder: Secondary | ICD-10-CM | POA: Diagnosis not present

## 2021-06-24 DIAGNOSIS — R7303 Prediabetes: Secondary | ICD-10-CM | POA: Diagnosis not present

## 2021-06-24 DIAGNOSIS — Z23 Encounter for immunization: Secondary | ICD-10-CM | POA: Diagnosis not present

## 2021-06-24 DIAGNOSIS — E039 Hypothyroidism, unspecified: Secondary | ICD-10-CM | POA: Diagnosis not present

## 2021-06-24 DIAGNOSIS — F900 Attention-deficit hyperactivity disorder, predominantly inattentive type: Secondary | ICD-10-CM | POA: Diagnosis not present

## 2021-06-30 ENCOUNTER — Other Ambulatory Visit: Payer: Self-pay

## 2021-06-30 ENCOUNTER — Ambulatory Visit
Admission: RE | Admit: 2021-06-30 | Discharge: 2021-06-30 | Disposition: A | Discharge status: Home / Self Care | Payer: PPO | Source: Ambulatory Visit | Attending: Family Medicine | Admitting: Family Medicine

## 2021-06-30 DIAGNOSIS — Z1231 Encounter for screening mammogram for malignant neoplasm of breast: Secondary | ICD-10-CM | POA: Diagnosis not present

## 2021-07-02 DIAGNOSIS — E039 Hypothyroidism, unspecified: Secondary | ICD-10-CM | POA: Diagnosis not present

## 2021-07-02 NOTE — Progress Notes (Addendum)
COVID swab appointment: n/a  COVID Vaccine Completed: yes x5 Date COVID Vaccine completed: 09/06/19, 09/27/19 Has received booster: COVID vaccine manufacturer: [place]      Date of COVID positive in last 90 days: no  PCP - Maury Dus, MD Cardiologist - n/a  CBC w/ diff and CMP from 06/24/21 on chart  Chest x-ray - 03/09/21 Epic EKG - 06/24/21 on chart Stress Test - 04/14/18 Epic ECHO - n/a Cardiac Cath - n/a Pacemaker/ICD device last checked: n/a Spinal Cord Stimulator: n/a  Sleep Study - n/a CPAP -   Fasting Blood Sugar - pre DM doesn't check BS Checks Blood Sugar __ times a day  Blood Thinner Instructions: n/a Aspirin Instructions: Last Dose:  Activity level: Can go up a flight of stairs and perform activities of daily living without stopping and without symptoms of chest pain or shortness of breath. SOB with activity     Anesthesia review:   Patient denies shortness of breath, fever, cough and chest pain at PAT appointment   Patient verbalized understanding of instructions that were given to them at the PAT appointment. Patient was also instructed that they will need to review over the PAT instructions again at home before surgery.

## 2021-07-02 NOTE — Patient Instructions (Addendum)
DUE TO COVID-19 ONLY ONE VISITOR IS ALLOWED TO COME WITH YOU AND STAY IN THE WAITING ROOM ONLY DURING PRE OP AND PROCEDURE.   **NO VISITORS ARE ALLOWED IN THE SHORT STAY AREA OR RECOVERY ROOM!!**       Your procedure is scheduled on: 07/16/21   Report to [facility] Main Entrance    Report to admitting at 5:15 AM   Call this number if you have problems the morning of surgery [phone]   Do not eat food :After Midnight.   May have liquids until 4:30 AM day of surgery  CLEAR LIQUID DIET  Foods Allowed                                                                     Foods Excluded  Water, Black Coffee and tea (no milk or creamer)           liquids that you cannot  Plain Jell-O in any flavor  (No red)                                    see through such as: Fruit ices (not with fruit pulp)                                            milk, soups, orange juice              Iced Popsicles (No red)                                                All solid food                                   Apple juices Sports drinks like Gatorade (No red) Lightly seasoned clear broth or consume(fat free) Sugar      The day of surgery:  Drink ONE (1) Pre-Surgery G2 by 4:30 am the morning of surgery. Drink in one sitting. Do not sip.  This drink was given to you during your hospital  pre-op appointment visit. Nothing else to drink after completing the  Pre-Surgery G2.          If you have questions, please contact your surgeon's office.   Oral Hygiene is also important to reduce your risk of infection.                                    Remember - BRUSH YOUR TEETH THE MORNING OF SURGERY WITH YOUR REGULAR TOOTHPASTE   Take these medicines the morning of surgery with A SIP OF WATER: Xanax, Wellbutrin, Cymbalta, Synthroid, Omeprazole, Zocor.  You may not have any metal on your body including hair pins, jewelry, and body piercing             Do not wear  make-up, lotions, powders, perfumes, or deodorant  Do not wear nail polish including gel and S&S, artificial/acrylic nails, or any other type of covering on natural nails including finger and toenails. If you have artificial nails, gel coating, etc. that needs to be removed by a nail salon please have this removed prior to surgery or surgery may need to be canceled/ delayed if the surgeon/ anesthesia feels like they are unable to be safely monitored.   Do not shave  48 hours prior to surgery.    Do not bring valuables to the hospital. Plain.    Patients discharged on the day of surgery will not be allowed to drive home.   Special Instructions: Bring a copy of your healthcare power of attorney and living will documents         the day of surgery if you haven't scanned them before.              Please read over the following fact sheets you were given: IF YOU HAVE QUESTIONS ABOUT YOUR PRE-OP INSTRUCTIONS PLEASE CALL [facility] - Preparing for Surgery Before surgery, you can play an important role.  Because skin is not sterile, your skin needs to be as free of germs as possible.  You can reduce the number of germs on your skin by washing with CHG (chlorahexidine gluconate) soap before surgery.  CHG is an antiseptic cleaner which kills germs and bonds with the skin to continue killing germs even after washing. Please DO NOT use if you have an allergy to CHG or antibacterial soaps.  If your skin becomes reddened/irritated stop using the CHG and inform your nurse when you arrive at Short Stay. Do not shave (including legs and underarms) for at least 48 hours prior to the first CHG shower.  You may shave your face/neck.  Please follow these instructions carefully:  1.  Shower with CHG Soap the night before surgery and the  morning of surgery.  2.  If you choose to wash your hair, wash your hair first as usual with your normal   shampoo.  3.  After you shampoo, rinse your hair and body thoroughly to remove the shampoo.                             4.  Use CHG as you would any other liquid soap.  You can apply chg directly to the skin and wash.  Gently with a scrungie or clean washcloth.  5.  Apply the CHG Soap to your body ONLY FROM THE NECK DOWN.   Do   not use on face/ open                           Wound or open sores. Avoid contact with eyes, ears mouth and   genitals (private parts).                       Wash face,  Genitals (private parts) with your normal soap.             6.  Wash thoroughly, paying special attention to the area where your    surgery  will be performed.  7.  Thoroughly rinse your body with warm water from the neck down.  8.  DO NOT shower/wash with your normal soap after using and rinsing off the CHG Soap.                9.  Pat yourself dry with a clean towel.            10.  Wear clean pajamas.            11.  Place clean sheets on your bed the night of your first shower and do not  sleep with pets. Day of Surgery : Do not apply any lotions/deodorants the morning of surgery.  Please wear clean clothes to the hospital/surgery center.  FAILURE TO FOLLOW THESE INSTRUCTIONS MAY RESULT IN THE CANCELLATION OF YOUR SURGERY  PATIENT SIGNATURE_________________________________  NURSE SIGNATURE__________________________________  ________________________________________________________________________   [name] [name]  An incentive spirometer is a tool that can help keep your lungs clear and active. This tool measures how well you are filling your lungs with each breath. Taking long deep breaths may help reverse or decrease the chance of developing breathing (pulmonary) problems (especially infection) following: A long period of time when you are unable to move or be active. BEFORE THE PROCEDURE  If the spirometer includes an indicator to show your best effort, your nurse or respiratory  therapist will set it to a desired goal. If possible, sit up straight or lean slightly forward. Try not to slouch. Hold the incentive spirometer in an upright position. INSTRUCTIONS FOR USE  Sit on the edge of your bed if possible, or sit up as far as you can in bed or on a chair. Hold the incentive spirometer in an upright position. Breathe out normally. Place the mouthpiece in your mouth and seal your lips tightly around it. Breathe in slowly and as deeply as possible, raising the piston or the ball toward the top of the column. Hold your breath for 3-5 seconds or for as long as possible. Allow the piston or ball to fall to the bottom of the column. Remove the mouthpiece from your mouth and breathe out normally. Rest for a few seconds and repeat Steps 1 through 7 at least 10 times every 1-2 hours when you are awake. Take your time and take a few normal breaths between deep breaths. The spirometer may include an indicator to show your best effort. Use the indicator as a goal to work toward during each repetition. After each set of 10 deep breaths, practice coughing to be sure your lungs are clear. If you have an incision (the cut made at the time of surgery), support your incision when coughing by placing a pillow or rolled up towels firmly against it. Once you are able to get out of bed, walk around indoors and cough well. You may stop using the incentive spirometer when instructed by your caregiver.  RISKS AND COMPLICATIONS Take your time so you do not get dizzy or light-headed. If you are in pain, you may need to take or ask for pain medication before doing incentive spirometry. It is harder to take a deep breath if you are having pain. AFTER USE Rest and breathe slowly and easily. It can be helpful to keep track of a log of your progress. Your caregiver can provide you with a simple table to help with this. If you are  using the spirometer at home, follow these instructions: [place] IF:  You are having difficultly using the spirometer. You have trouble using the spirometer as often as instructed. Your pain medication is not giving enough relief while using the spirometer. You develop fever of 100.5 F (38.1 C) or higher. SEEK IMMEDIATE MEDICAL CARE IF:  You cough up bloody sputum that had not been present before. You develop fever of 102 F (38.9 C) or greater. You develop worsening pain at or near the incision site. MAKE SURE YOU:  Understand these instructions. Will watch your condition. Will get help right away if you are not doing well or get worse. Document Released: 12/13/2006 Document Revised: 10/25/2011 Document Reviewed: 02/13/2007 ExitCare Patient Information 2014 ExitCare, [place].   ________________________________________________________________________  WHAT IS A BLOOD TRANSFUSION? Blood Transfusion Information  A transfusion is the replacement of blood or some of its parts. Blood is made up of multiple cells which provide different functions. Red blood cells carry oxygen and are used for blood loss replacement. White blood cells fight against infection. Platelets control bleeding. Plasma helps clot blood. Other blood products are available for specialized needs, such as hemophilia or other clotting disorders. BEFORE THE TRANSFUSION  Who gives blood for transfusions?  Healthy volunteers who are fully evaluated to make sure their blood is safe. This is blood bank blood. Transfusion therapy is the safest it has ever been in the practice of medicine. Before blood is taken from a donor, a complete history is taken to make sure that person has no history of diseases nor engages in risky social behavior (examples are intravenous drug use or sexual activity with multiple partners). The donor's travel history is screened to minimize risk of transmitting infections, such as malaria. The donated blood is tested for signs of infectious diseases, such as HIV  and hepatitis. The blood is then tested to be sure it is compatible with you in order to minimize the chance of a transfusion reaction. If you or a relative donates blood, this is often done in anticipation of surgery and is not appropriate for emergency situations. It takes many days to process the donated blood. RISKS AND COMPLICATIONS Although transfusion therapy is very safe and saves many lives, the main dangers of transfusion include:  Getting an infectious disease. Developing a transfusion reaction. This is an allergic reaction to something in the blood you were given. Every precaution is taken to prevent this. The decision to have a blood transfusion has been considered carefully by your caregiver before blood is given. Blood is not given unless the benefits outweigh the risks. AFTER THE TRANSFUSION Right after receiving a blood transfusion, you will usually feel much better and more energetic. This is especially true if your red blood cells have gotten low (anemic). The transfusion raises the level of the red blood cells which carry oxygen, and this usually causes an energy increase. The nurse administering the transfusion will monitor you carefully for complications. HOME CARE INSTRUCTIONS  No special instructions are needed after a transfusion. You may find your energy is better. Speak with your caregiver about any limitations on activity for underlying diseases you may have. SEEK MEDICAL CARE IF:  Your condition is not improving after your transfusion. You develop redness or irritation at the intravenous (IV) site. SEEK IMMEDIATE MEDICAL CARE IF:  Any of the following symptoms occur over the next 12 hours: Shaking chills. You have a temperature by mouth above 102 F (38.9 C), not controlled by  medicine. Chest, back, or muscle pain. People around you feel you are not acting correctly or are confused. Shortness of breath or difficulty breathing. Dizziness and fainting. You get a rash  or develop hives. You have a decrease in urine output. Your urine turns a dark color or changes to pink, red, or brown. Any of the following symptoms occur over the next 10 days: You have a temperature by mouth above 102 F (38.9 C), not controlled by medicine. Shortness of breath. Weakness after normal activity. The white part of the eye turns yellow (jaundice). You have a decrease in the amount of urine or are urinating less often. Your urine turns a dark color or changes to pink, red, or brown. Document Released: 07/30/2000 Document Revised: 10/25/2011 Document Reviewed: 03/18/2008 ExitCare Patient Information 2014 [place].  _______________________________________________________________________ [facility]- Preparing for Total Shoulder Arthroplasty    Before surgery, you can play an important role. Because skin is not sterile, your skin needs to be as free of germs as possible. You can reduce the number of germs on your skin by using the following products. Benzoyl Peroxide Gel Reduces the number of germs present on the skin Applied twice a day to shoulder area starting two days before surgery    ==================================================================  Please follow these instructions carefully:  BENZOYL PEROXIDE 5% GEL  Please do not use if you have an allergy to benzoyl peroxide.   If your skin becomes reddened/irritated stop using the benzoyl peroxide.  Starting two days before surgery, apply as follows: Apply benzoyl peroxide in the morning and at night. Apply after taking a shower. If you are not taking a shower clean entire shoulder front, back, and side along with the armpit with a clean wet washcloth.  Place a quarter-sized dollop on your shoulder and rub in thoroughly, making sure to cover the front, back, and side of your shoulder, along with the armpit.   2 days before ____ AM   ____ PM              1 day before ____ AM   ____ PM                          Do this twice a day for two days.  (Last application is the night before surgery, AFTER using the CHG soap as described below).  Do NOT apply benzoyl peroxide gel on the day of surgery.

## 2021-07-03 ENCOUNTER — Encounter (HOSPITAL_COMMUNITY)
Admission: RE | Admit: 2021-07-03 | Discharge: 2021-07-03 | Disposition: A | Discharge status: Home / Self Care | Payer: PPO | Source: Ambulatory Visit | Attending: Orthopedic Surgery | Admitting: Orthopedic Surgery

## 2021-07-03 ENCOUNTER — Encounter (HOSPITAL_COMMUNITY): Payer: Self-pay

## 2021-07-03 ENCOUNTER — Other Ambulatory Visit: Payer: Self-pay

## 2021-07-03 VITALS — BP 156/74 | HR 77 | Temp 98.2°F | Resp 16 | Ht 63.0 in | Wt 274.2 lb

## 2021-07-03 DIAGNOSIS — Z01812 Encounter for preprocedural laboratory examination: Secondary | ICD-10-CM | POA: Diagnosis not present

## 2021-07-03 DIAGNOSIS — R7303 Prediabetes: Secondary | ICD-10-CM

## 2021-07-03 DIAGNOSIS — Z01818 Encounter for other preprocedural examination: Secondary | ICD-10-CM

## 2021-07-03 LAB — SURGICAL PCR SCREEN
MRSA, PCR: NEGATIVE
Staphylococcus aureus: NEGATIVE

## 2021-07-03 LAB — TYPE AND SCREEN
ABO/RH(D): A POS
Antibody Screen: NEGATIVE

## 2021-07-03 LAB — GLUCOSE, CAPILLARY: Glucose-Capillary: 97 mg/dL (ref 70–99)

## 2021-07-16 ENCOUNTER — Ambulatory Visit (HOSPITAL_COMMUNITY): Payer: PPO

## 2021-07-16 ENCOUNTER — Encounter (HOSPITAL_COMMUNITY): Payer: Self-pay | Admitting: Orthopedic Surgery

## 2021-07-16 ENCOUNTER — Ambulatory Visit (HOSPITAL_COMMUNITY): Payer: PPO | Admitting: Certified Registered Nurse Anesthetist

## 2021-07-16 ENCOUNTER — Ambulatory Visit (HOSPITAL_COMMUNITY)
Admission: RE | Admit: 2021-07-16 | Discharge: 2021-07-16 | Disposition: A | Discharge status: Home / Self Care | Payer: PPO | Attending: Orthopedic Surgery | Admitting: Orthopedic Surgery

## 2021-07-16 ENCOUNTER — Encounter (HOSPITAL_COMMUNITY)
Admission: RE | Disposition: A | Discharge status: Home / Self Care | Payer: Self-pay | Source: Home / Self Care | Attending: Orthopedic Surgery

## 2021-07-16 DIAGNOSIS — Y834 Other reconstructive surgery as the cause of abnormal reaction of the patient, or of later complication, without mention of misadventure at the time of the procedure: Secondary | ICD-10-CM | POA: Insufficient documentation

## 2021-07-16 DIAGNOSIS — Z87891 Personal history of nicotine dependence: Secondary | ICD-10-CM | POA: Diagnosis not present

## 2021-07-16 DIAGNOSIS — Z96611 Presence of right artificial shoulder joint: Secondary | ICD-10-CM | POA: Diagnosis not present

## 2021-07-16 DIAGNOSIS — R7303 Prediabetes: Secondary | ICD-10-CM | POA: Insufficient documentation

## 2021-07-16 DIAGNOSIS — M9689 Other intraoperative and postprocedural complications and disorders of the musculoskeletal system: Secondary | ICD-10-CM | POA: Insufficient documentation

## 2021-07-16 DIAGNOSIS — M1990 Unspecified osteoarthritis, unspecified site: Secondary | ICD-10-CM | POA: Insufficient documentation

## 2021-07-16 DIAGNOSIS — Z01811 Encounter for preprocedural respiratory examination: Secondary | ICD-10-CM

## 2021-07-16 DIAGNOSIS — K219 Gastro-esophageal reflux disease without esophagitis: Secondary | ICD-10-CM | POA: Diagnosis not present

## 2021-07-16 DIAGNOSIS — G709 Myoneural disorder, unspecified: Secondary | ICD-10-CM | POA: Insufficient documentation

## 2021-07-16 DIAGNOSIS — Z6841 Body Mass Index (BMI) 40.0 and over, adult: Secondary | ICD-10-CM | POA: Diagnosis not present

## 2021-07-16 DIAGNOSIS — F32A Depression, unspecified: Secondary | ICD-10-CM | POA: Insufficient documentation

## 2021-07-16 DIAGNOSIS — I10 Essential (primary) hypertension: Secondary | ICD-10-CM | POA: Insufficient documentation

## 2021-07-16 DIAGNOSIS — M12811 Other specific arthropathies, not elsewhere classified, right shoulder: Secondary | ICD-10-CM | POA: Diagnosis not present

## 2021-07-16 DIAGNOSIS — G8918 Other acute postprocedural pain: Secondary | ICD-10-CM | POA: Diagnosis not present

## 2021-07-16 DIAGNOSIS — Z471 Aftercare following joint replacement surgery: Secondary | ICD-10-CM | POA: Diagnosis not present

## 2021-07-16 DIAGNOSIS — M25711 Osteophyte, right shoulder: Secondary | ICD-10-CM | POA: Insufficient documentation

## 2021-07-16 DIAGNOSIS — F419 Anxiety disorder, unspecified: Secondary | ICD-10-CM | POA: Insufficient documentation

## 2021-07-16 DIAGNOSIS — E7800 Pure hypercholesterolemia, unspecified: Secondary | ICD-10-CM | POA: Diagnosis not present

## 2021-07-16 DIAGNOSIS — M75101 Unspecified rotator cuff tear or rupture of right shoulder, not specified as traumatic: Secondary | ICD-10-CM | POA: Diagnosis not present

## 2021-07-16 DIAGNOSIS — E890 Postprocedural hypothyroidism: Secondary | ICD-10-CM | POA: Diagnosis not present

## 2021-07-16 HISTORY — PX: REVERSE SHOULDER ARTHROPLASTY: SHX5054

## 2021-07-16 LAB — GLUCOSE, CAPILLARY: Glucose-Capillary: 104 mg/dL — ABNORMAL HIGH (ref 70–99)

## 2021-07-16 SURGERY — ARTHROPLASTY, SHOULDER, TOTAL, REVERSE
Anesthesia: General | Site: Shoulder | Laterality: Right

## 2021-07-16 MED ORDER — LIDOCAINE 2% (20 MG/ML) 5 ML SYRINGE
INTRAMUSCULAR | Status: DC | PRN
  Administered 2021-07-16: 40 mg via INTRAVENOUS

## 2021-07-16 MED ORDER — BUPIVACAINE LIPOSOME 1.3 % IJ SUSP
INTRAMUSCULAR | Status: DC | PRN
  Administered 2021-07-16: 133 mg via PERINEURAL

## 2021-07-16 MED ORDER — MIDAZOLAM HCL 5 MG/5ML IJ SOLN
INTRAMUSCULAR | Status: DC | PRN
Start: 2021-07-16 — End: 2021-07-16
  Administered 2021-07-16 (×2): 1 mg via INTRAVENOUS

## 2021-07-16 MED ORDER — FENTANYL CITRATE (PF) 100 MCG/2ML IJ SOLN
INTRAMUSCULAR | Status: DC | PRN
  Administered 2021-07-16 (×2): 50 ug via INTRAVENOUS

## 2021-07-16 MED ORDER — MIDAZOLAM HCL 2 MG/2ML IJ SOLN
INTRAMUSCULAR | Status: AC
  Filled 2021-07-16: qty 2

## 2021-07-16 MED ORDER — FENTANYL CITRATE (PF) 100 MCG/2ML IJ SOLN
INTRAMUSCULAR | Status: AC
  Filled 2021-07-16: qty 2

## 2021-07-16 MED ORDER — FENTANYL CITRATE PF 50 MCG/ML IJ SOSY: 25.0000 ug | PREFILLED_SYRINGE | INTRAMUSCULAR | Status: DC | PRN

## 2021-07-16 MED ORDER — 0.9 % SODIUM CHLORIDE (POUR BTL) OPTIME
TOPICAL | Status: DC | PRN
  Administered 2021-07-16: 150 mL

## 2021-07-16 MED ORDER — ACETAMINOPHEN 160 MG/5ML PO SOLN: 1000.0000 mg | Freq: Once | ORAL | Status: DC | PRN

## 2021-07-16 MED ORDER — METHOCARBAMOL 500 MG IVPB - SIMPLE MED: 500.0000 mg | Freq: Four times a day (QID) | INTRAVENOUS | Status: DC | PRN

## 2021-07-16 MED ORDER — DEXAMETHASONE SODIUM PHOSPHATE 10 MG/ML IJ SOLN
INTRAMUSCULAR | Status: DC | PRN
  Administered 2021-07-16: 10 mg via INTRAVENOUS

## 2021-07-16 MED ORDER — WATER FOR IRRIGATION, STERILE IR SOLN
Status: DC | PRN
  Administered 2021-07-16: 2000 mL

## 2021-07-16 MED ORDER — ROCURONIUM BROMIDE 10 MG/ML (PF) SYRINGE
PREFILLED_SYRINGE | INTRAVENOUS | Status: AC
  Filled 2021-07-16: qty 10

## 2021-07-16 MED ORDER — DEXAMETHASONE SODIUM PHOSPHATE 10 MG/ML IJ SOLN
INTRAMUSCULAR | Status: AC
  Filled 2021-07-16: qty 1

## 2021-07-16 MED ORDER — OXYCODONE HCL 5 MG PO TABS: 5.0000 mg | ORAL_TABLET | Freq: Once | ORAL | Status: DC | PRN

## 2021-07-16 MED ORDER — LIDOCAINE HCL (PF) 2 % IJ SOLN
INTRAMUSCULAR | Status: AC
  Filled 2021-07-16: qty 5

## 2021-07-16 MED ORDER — SUGAMMADEX SODIUM 500 MG/5ML IV SOLN
INTRAVENOUS | Status: DC | PRN
  Administered 2021-07-16: 300 mg via INTRAVENOUS

## 2021-07-16 MED ORDER — OXYCODONE-ACETAMINOPHEN 5-325 MG PO TABS: 1.0000 | ORAL_TABLET | Freq: Four times a day (QID) | ORAL | 0 refills | Status: AC | PRN

## 2021-07-16 MED ORDER — ONDANSETRON HCL 4 MG/2ML IJ SOLN
INTRAMUSCULAR | Status: AC
  Filled 2021-07-16: qty 2

## 2021-07-16 MED ORDER — EPHEDRINE SULFATE-NACL 50-0.9 MG/10ML-% IV SOSY
PREFILLED_SYRINGE | INTRAVENOUS | Status: DC | PRN
  Administered 2021-07-16: 5 mg via INTRAVENOUS

## 2021-07-16 MED ORDER — ROCURONIUM BROMIDE 10 MG/ML (PF) SYRINGE
PREFILLED_SYRINGE | INTRAVENOUS | Status: DC | PRN
  Administered 2021-07-16: 60 mg via INTRAVENOUS
  Administered 2021-07-16: 10 mg via INTRAVENOUS

## 2021-07-16 MED ORDER — CHLORHEXIDINE GLUCONATE 0.12 % MT SOLN
15.0000 mL | Freq: Once | OROMUCOSAL | Status: AC
  Administered 2021-07-16: 15 mL via OROMUCOSAL

## 2021-07-16 MED ORDER — TIZANIDINE HCL 2 MG PO TABS: 2.0000 mg | ORAL_TABLET | Freq: Three times a day (TID) | ORAL | 0 refills | Status: AC | PRN

## 2021-07-16 MED ORDER — BUPIVACAINE-EPINEPHRINE (PF) 0.5% -1:200000 IJ SOLN
INTRAMUSCULAR | Status: DC | PRN
  Administered 2021-07-16: 20 mL via PERINEURAL

## 2021-07-16 MED ORDER — OXYCODONE HCL 5 MG/5ML PO SOLN: 5.0000 mg | Freq: Once | ORAL | Status: DC | PRN

## 2021-07-16 MED ORDER — ACETAMINOPHEN 10 MG/ML IV SOLN: 1000.0000 mg | Freq: Once | INTRAVENOUS | Status: DC | PRN

## 2021-07-16 MED ORDER — ACETAMINOPHEN 500 MG PO TABS: 1000.0000 mg | ORAL_TABLET | Freq: Once | ORAL | Status: DC | PRN

## 2021-07-16 MED ORDER — PROPOFOL 10 MG/ML IV BOLUS
INTRAVENOUS | Status: DC | PRN
  Administered 2021-07-16: 180 mg via INTRAVENOUS

## 2021-07-16 MED ORDER — EPHEDRINE 5 MG/ML INJ
INTRAVENOUS | Status: AC
  Filled 2021-07-16: qty 5

## 2021-07-16 MED ORDER — TRANEXAMIC ACID-NACL 1000-0.7 MG/100ML-% IV SOLN
1000.0000 mg | INTRAVENOUS | Status: AC
  Administered 2021-07-16: 1000 mg via INTRAVENOUS
  Filled 2021-07-16: qty 100

## 2021-07-16 MED ORDER — SODIUM CHLORIDE 0.9 % IR SOLN
Status: DC | PRN
  Administered 2021-07-16: 550 mL

## 2021-07-16 MED ORDER — VANCOMYCIN HCL IN DEXTROSE 1-5 GM/200ML-% IV SOLN
1000.0000 mg | INTRAVENOUS | Status: AC
  Administered 2021-07-16: 1000 mg via INTRAVENOUS
  Filled 2021-07-16: qty 200

## 2021-07-16 MED ORDER — ORAL CARE MOUTH RINSE: 15.0000 mL | Freq: Once | OROMUCOSAL | Status: AC

## 2021-07-16 MED ORDER — LACTATED RINGERS IV SOLN: INTRAVENOUS | Status: DC

## 2021-07-16 MED ORDER — SUGAMMADEX SODIUM 500 MG/5ML IV SOLN
INTRAVENOUS | Status: AC
  Filled 2021-07-16: qty 5

## 2021-07-16 MED ORDER — PROPOFOL 10 MG/ML IV BOLUS
INTRAVENOUS | Status: AC
  Filled 2021-07-16: qty 20

## 2021-07-16 MED ORDER — METHOCARBAMOL 500 MG PO TABS: 500.0000 mg | ORAL_TABLET | Freq: Four times a day (QID) | ORAL | Status: DC | PRN

## 2021-07-16 MED ORDER — PHENYLEPHRINE HCL-NACL 20-0.9 MG/250ML-% IV SOLN
INTRAVENOUS | Status: DC | PRN
  Administered 2021-07-16: 25 ug/min via INTRAVENOUS

## 2021-07-16 SURGICAL SUPPLY — 80 items
AID PSTN UNV HD RSTRNT DISP (MISCELLANEOUS) ×1
BAG COUNTER SPONGE SURGICOUNT (BAG) IMPLANT
BAG SPEC THK2 15X12 ZIP CLS (MISCELLANEOUS) ×1
BAG SPNG CNTER NS LX DISP (BAG)
BAG ZIPLOCK 12X15 (MISCELLANEOUS) ×2 IMPLANT
BASEPLATE P2 COATD GLND 6.5X30 (Shoulder) IMPLANT
BIT DRILL 1.6MX128 (BIT) ×1 IMPLANT
BIT DRILL 2.5 DIA 127 CALI (BIT) ×1 IMPLANT
BIT DRILL 4 DIA CALIBRATED (BIT) ×1 IMPLANT
BLADE SAW SAG 73X25 THK (BLADE) ×1
BLADE SAW SGTL 73X25 THK (BLADE) ×1 IMPLANT
BOOTIES KNEE HIGH SLOAN (MISCELLANEOUS) ×4 IMPLANT
BSPLAT GLND 30 STRL LF SHLDR (Shoulder) ×2 IMPLANT
COOLER ICEMAN CLASSIC (MISCELLANEOUS) ×1 IMPLANT
COVER BACK TABLE 60X90IN (DRAPES) ×2 IMPLANT
COVER SURGICAL LIGHT HANDLE (MISCELLANEOUS) ×2 IMPLANT
DRAPE INCISE IOBAN 66X45 STRL (DRAPES) ×2 IMPLANT
DRAPE ORTHO SPLIT 77X108 STRL (DRAPES) ×4
DRAPE POUCH INSTRU U-SHP 10X18 (DRAPES) ×2 IMPLANT
DRAPE SHEET LG 3/4 BI-LAMINATE (DRAPES) ×2 IMPLANT
DRAPE SURG 17X11 SM STRL (DRAPES) ×2 IMPLANT
DRAPE SURG ORHT 6 SPLT 77X108 (DRAPES) ×2 IMPLANT
DRAPE TOP 10253 STERILE (DRAPES) ×2 IMPLANT
DRAPE U-SHAPE 47X51 STRL (DRAPES) ×2 IMPLANT
DRSG AQUACEL AG ADV 3.5X10 (GAUZE/BANDAGES/DRESSINGS) ×1 IMPLANT
DURAPREP 26ML APPLICATOR (WOUND CARE) ×4 IMPLANT
ELECT BLADE TIP CTD 4 INCH (ELECTRODE) ×1 IMPLANT
ELECT REM PT RETURN 15FT ADLT (MISCELLANEOUS) ×2 IMPLANT
GLOVE SRG 8 PF TXTR STRL LF DI (GLOVE) ×1 IMPLANT
GLOVE SURG ENC MOIS LTX SZ7.5 (GLOVE) ×2 IMPLANT
GLOVE SURG POLYISO LF SZ6.5 (GLOVE) ×2 IMPLANT
GLOVE SURG UNDER POLY LF SZ6.5 (GLOVE) ×2 IMPLANT
GLOVE SURG UNDER POLY LF SZ8 (GLOVE) ×2
GOWN STRL REUS W/TWL LRG LVL3 (GOWN DISPOSABLE) ×2 IMPLANT
GOWN STRL REUS W/TWL XL LVL3 (GOWN DISPOSABLE) ×2 IMPLANT
HANDPIECE INTERPULSE COAX TIP (DISPOSABLE) ×2
HOOD PEEL AWAY FLYTE STAYCOOL (MISCELLANEOUS) ×6 IMPLANT
HUMERA STEM SM SHELL SHOU 10 (Miscellaneous) ×2 IMPLANT
INSERT SMALL SOCKET 32MM NEU (Insert) ×1 IMPLANT
KIT BASIN OR (CUSTOM PROCEDURE TRAY) ×2 IMPLANT
KIT TURNOVER KIT A (KITS) IMPLANT
MANIFOLD NEPTUNE II (INSTRUMENTS) ×2 IMPLANT
NDL TROCAR POINT SZ 2 1/2 (NEEDLE) IMPLANT
NEEDLE TROCAR POINT SZ 2 1/2 (NEEDLE) ×2 IMPLANT
NS IRRIG 1000ML POUR BTL (IV SOLUTION) ×2 IMPLANT
P2 COATDE GLNOID BSEPLT 6.5X30 (Shoulder) ×4 IMPLANT
PACK SHOULDER (CUSTOM PROCEDURE TRAY) ×2 IMPLANT
PAD COLD SHLDR WRAP-ON (PAD) ×1 IMPLANT
PROTECTOR NERVE ULNAR (MISCELLANEOUS) IMPLANT
RESTRAINT HEAD UNIVERSAL NS (MISCELLANEOUS) ×1 IMPLANT
RETRIEVER SUT HEWSON (MISCELLANEOUS) ×1 IMPLANT
SCREW BONE LOCKING RSP 5.0X14 (Screw) ×2 IMPLANT
SCREW BONE LOCKING RSP 5.0X30 (Screw) ×4 IMPLANT
SCREW BONE RSP LOCK 5X14 (Screw) IMPLANT
SCREW BONE RSP LOCK 5X18 (Screw) IMPLANT
SCREW BONE RSP LOCK 5X22 (Screw) IMPLANT
SCREW BONE RSP LOCK 5X30 (Screw) IMPLANT
SCREW BONE RSP LOCKING 18MM LG (Screw) ×2 IMPLANT
SCREW BONE RSP LOCKING 5.0X32 (Screw) ×2 IMPLANT
SCREW RETAIN W/HEAD 4MM OFFSET (Shoulder) ×1 IMPLANT
SET HNDPC FAN SPRY TIP SCT (DISPOSABLE) ×1 IMPLANT
SLING ARM IMMOBILIZER LRG (SOFTGOODS) ×1 IMPLANT
SLING ARM IMMOBILIZER MED (SOFTGOODS) IMPLANT
SPONGE T-LAP 18X18 ~~LOC~~+RFID (SPONGE) ×2 IMPLANT
STEM HUMERAL SM SHELL SHOU 10 (Miscellaneous) IMPLANT
STRIP CLOSURE SKIN 1/2X4 (GAUZE/BANDAGES/DRESSINGS) ×3 IMPLANT
SUCTION FRAZIER HANDLE 10FR (MISCELLANEOUS)
SUCTION TUBE FRAZIER 10FR DISP (MISCELLANEOUS) IMPLANT
SUPPORT WRAP ARM LG (MISCELLANEOUS) IMPLANT
SUT ETHIBOND 2 V 37 (SUTURE) IMPLANT
SUT FIBERWIRE #2 38 REV NDL BL (SUTURE)
SUT MNCRL AB 4-0 PS2 18 (SUTURE) ×2 IMPLANT
SUT VIC AB 2-0 CT1 27 (SUTURE) ×4
SUT VIC AB 2-0 CT1 TAPERPNT 27 (SUTURE) ×1 IMPLANT
SUTURE FIBERWR#2 38 REV NDL BL (SUTURE) IMPLANT
TAPE LABRALWHITE 1.5X36 (TAPE) IMPLANT
TAPE SUT LABRALTAP WHT/BLK (SUTURE) IMPLANT
TOWEL OR 17X26 10 PK STRL BLUE (TOWEL DISPOSABLE) ×2 IMPLANT
TOWEL OR NON WOVEN STRL DISP B (DISPOSABLE) ×1 IMPLANT
WATER STERILE IRR 1000ML POUR (IV SOLUTION) ×3 IMPLANT

## 2021-07-16 NOTE — Anesthesia Procedure Notes (Signed)
Anesthesia Regional Block: Interscalene brachial plexus block   Pre-Anesthetic Checklist: , timeout performed,  Correct Patient, Correct Site, Correct Laterality,  Correct Procedure, Correct Position, site marked,  Risks and benefits discussed,  Surgical consent,  Pre-op evaluation,  At surgeon's request and post-op pain management  Laterality: Right and Upper  Prep: chloraprep       Needles:  Injection technique: Single-shot      Needle Length: 5cm  Needle Gauge: 22     Additional Needles: Arrow StimuQuik ECHO Echogenic Stimulating PNB Needle  Procedures:,,,, ultrasound used (permanent image in chart),,    Narrative:  Start time: 07/16/2021 7:18 AM End time: 07/16/2021 7:24 AM Injection made incrementally with aspirations every 5 mL.  Performed by: Personally  Anesthesiologist: Oleta Mouse, MD

## 2021-07-16 NOTE — Anesthesia Preprocedure Evaluation (Signed)
Anesthesia Evaluation  Patient identified by MRN, date of birth, ID band Patient awake    Reviewed: Allergy & Precautions, NPO status , Patient's Chart, lab work & pertinent test results  History of Anesthesia Complications Negative for: history of anesthetic complications  Airway Mallampati: IV  TM Distance: >3 FB Neck ROM: Limited    Dental  (+) Dental Advisory Given, Teeth Intact,    Pulmonary neg shortness of breath, neg sleep apnea, neg COPD, neg recent URI, former smoker,    breath sounds clear to auscultation       Cardiovascular hypertension, Pt. on medications (-) angina(-) Past MI and (-) CHF (-) dysrhythmias  Rhythm:Regular  ? Nuclear stress EF: 53%. ? There was no ST segment deviation noted during stress. ? No T wave inversion was noted during stress. ? The study is normal. ? This is a low risk study. ? The left ventricular ejection fraction is mildly decreased (45-54%).    Neuro/Psych PSYCHIATRIC DISORDERS Anxiety Depression  Neuromuscular disease    GI/Hepatic Neg liver ROS, GERD  Medicated and Controlled,  Endo/Other  Hypothyroidism Morbid obesity  Renal/GU negative Renal ROSLab Results      Component                Value               Date                      CREATININE               0.80                03/18/2021                Musculoskeletal  (+) Arthritis ,   Abdominal   Peds  Hematology negative hematology ROS (+) Lab Results      Component                Value               Date                      WBC                      6.2                 03/09/2021                HGB                      12.4                03/09/2021                HCT                      40.7                03/09/2021                MCV                      89.1                03/09/2021                PLT  366                 03/09/2021              Anesthesia Other Findings    Reproductive/Obstetrics                             Anesthesia Physical Anesthesia Plan  ASA: 3  Anesthesia Plan: General   Post-op Pain Management: Regional block   Induction: Intravenous  PONV Risk Score and Plan: 3 and Ondansetron and Dexamethasone  Airway Management Planned: Oral ETT and Video Laryngoscope Planned  Additional Equipment: None  Intra-op Plan:   Post-operative Plan: Extubation in OR  Informed Consent: I have reviewed the patients History and Physical, chart, labs and discussed the procedure including the risks, benefits and alternatives for the proposed anesthesia with the patient or authorized representative who has indicated his/her understanding and acceptance.     Dental advisory given  Plan Discussed with: CRNA and Anesthesiologist  Anesthesia Plan Comments:         Anesthesia Quick Evaluation

## 2021-07-16 NOTE — Anesthesia Procedure Notes (Signed)
Procedure Name: Intubation Date/Time: 07/16/2021 7:37 AM Performed by: Maxwell Caul, CRNA Pre-anesthesia Checklist: Patient identified, Emergency Drugs available, Suction available and Patient being monitored Patient Re-evaluated:Patient Re-evaluated prior to induction Oxygen Delivery Method: Circle system utilized Preoxygenation: Pre-oxygenation with 100% oxygen Induction Type: IV induction Ventilation: Mask ventilation without difficulty Laryngoscope Size: Mac and 4 Grade View: Grade II Tube type: Oral Tube size: 7.0 mm Number of attempts: 1 Airway Equipment and Method: Stylet Placement Confirmation: ETT inserted through vocal cords under direct vision, positive ETCO2 and breath sounds checked- equal and bilateral Secured at: 21 cm Tube secured with: Tape Dental Injury: Teeth and Oropharynx as per pre-operative assessment

## 2021-07-16 NOTE — Discharge Instructions (Addendum)

## 2021-07-16 NOTE — Op Note (Signed)
Procedure(s): REVERSE SHOULDER ARTHROPLASTY Procedure Note  [name] [name] female 70 y.o. 07/16/2021   Preoperative diagnosis: Right shoulder irreparable rotator cuff tear with pseudoparalysis  Postoperative diagnosis: Same  Procedure(s) and Anesthesia Type:    * REVERSE SHOULDER ARTHROPLASTY - General   Indications:  70 y.o. female  With right shoulder pain and dysfunction after incompletely healed rotator cuff repair after dislocation.  She has failed extensive conservative management.     Surgeon: Rhae Hammock   Assistants: Sheryle Hail PA-C Amber was present and scrubbed throughout the procedure and was essential in positioning, retraction, exposure, and closure)  Anesthesia: General endotracheal anesthesia with preoperative interscalene block given by the attending anesthesiologist    Procedure Detail  REVERSE SHOULDER ARTHROPLASTY   Estimated Blood Loss:  200 mL         Drains: none  Blood Given: none          Specimens: none        Complications: Intraoperative glenoid fracture         Disposition: PACU - hemodynamically stable.         Condition: stable      OPERATIVE FINDINGS:  A DJO Altivate pressfit reverse total shoulder arthroplasty was placed with a  size 10 stem, a 32-4 glenosphere, and a standard-mm poly insert.  Bone quality was noted to be extremely poor and during implantation of the glenoid screws and intraoperative glenoid fracture occurred.  The baseplate was repositioned and acceptable fixation was achieved.  PROCEDURE: The patient was identified in the preoperative holding area  where I personally marked the operative site after verifying site, side,  and procedure with the patient. An interscalene block given by  the attending anesthesiologist in the holding area and the patient was taken back to the operating room where all extremities were  carefully padded in position after general anesthesia was induced. She  was placed in  a beach-chair position and the operative upper extremity was  prepped and draped in a standard sterile fashion. An approximately 10-  cm incision was made from the tip of the coracoid process to the center  point of the humerus at the level of the axilla. Dissection was carried  down through subcutaneous tissues to the level of the cephalic vein  which was taken laterally with the deltoid. The pectoralis major was  retracted medially. The subdeltoid space was developed and the lateral  edge of the conjoined tendon was identified. The undersurface of  conjoined tendon was palpated and the musculocutaneous nerve was not in  the field. Retractor was placed underneath the conjoined and second  retractor was placed lateral into the deltoid. The circumflex humeral  artery and vessels were identified and clamped and coagulated. The  biceps tendon was tenotomized.  The subscapularis was very thin and taken down with the underlying capsule.  The  joint was then gently externally rotated while the capsule was released  from the humeral neck around to just beyond the 6 o'clock position. At  this point, the joint was dislocated and the humeral head was presented  into the wound. The excessive osteophyte formation was removed with a  large rongeur.  The cutting guide was used to make the appropriate  head cut and the head was saved for potentially bone grafting.  The glenoid was exposed with the arm in an  abducted extended position. The anterior and posterior labrum were  completely excised and the capsule was released circumferentially to  allow for exposure  of the glenoid for preparation. The 2.5 mm drill was  placed using the guide in 5-10 inferior angulation and the tap was then advanced in the same hole. Small and large reamers were then used. The tap was then removed and the Metaglene was then screwed in with very poor purchase.  The peripheral guide was then used to drill the peripheral holes.   During for screw insertion once the screw engaged the plate with very light torque the glenoid fractured.  The screw and the baseplate were taken out to further examine the fracture.  The fracture involve the anterior and inferior quadrant of the glenoid.  The drill and guide were then used to reposition the central post angling it more posteriorly.  The baseplate was then reinserted with improved fixation from the medial cortex containing the fracture under the baseplate.  The baseplate was rotated to allow 3 screws to access bone outside of the fracture.  The screws were then drilled measured and filled with appropriate size screws with good fixation and stability of the fracture.  The inferior most screw was then placed into the fracture fragment to help contain this as well.  The size 32-4 glenosphere was then impacted on the [facility] taper and the central screw was placed. The humerus was then again exposed and the diaphyseal reamers were used followed by the metaphyseal reamers. The final broach was left in place in the proximal trial was placed. The joint was reduced and with this implant it was felt that soft tissue tensioning was appropriate with excellent stability and excellent range of motion. Therefore, final humeral stem was placed press-fit.  And then the trial polyethylene inserts were tested again and the above implant was felt to be the most appropriate for final insertion. The joint was reduced taken through full range of motion and felt to be stable. Soft tissue tension was appropriate.  The joint was then copiously irrigated with pulse  lavage and the wound was then closed. The subscapularis was not repaired.  Skin was closed with 2-0 Vicryl in a deep dermal layer and 4-0  Monocryl for skin closure. Steri-Strips were applied. Sterile  dressings were then applied as well as a sling. The patient was allowed  to awaken from general anesthesia, transferred to stretcher, and taken  to recovery  room in stable condition.   POSTOPERATIVE PLAN: The patient will be observed in the recovery room and if her pain is well controlled with the regional anesthesia and she is hemodynamically stable she could be discharged home today with family.  Given the intraoperative fracture her rehabilitation will be slowed down and we will plan to keep her in a sling for 6 weeks postoperatively until this has a chance to heal.

## 2021-07-16 NOTE — Evaluation (Signed)
Occupational Therapy Evaluation Patient Details Name: [name] [name] MRN: [id number] DOB: April 02, 1951 Today's Date: 07/16/2021   History of Present Illness 70 yo F presented for R TSA - Reverse.  PMH includes: ADHD, anxiety, GERD.  Prior TKA.   Clinical Impression   Patient presented for the above procedure.  Patient remains numb with no AROM to R UE due to block in place.  OT reviewed with patient/spouse: all weight bearing and A/PROM restrictions, sling management, HEP with sheets issued, positioning of pillows behind R shoulder in sitting and supine, ADL completion given shoulder restrictions.  Patient and spouse with good understanding.  Education sheets issued for reference.  Patient able to complete sit/stand dressing with assist as needed.  Patient scheduled to return home with assist as needed form spouse.  No further OT needs in the acute setting, follow up with MD as prescribed.       Recommendations for follow up therapy are one component of a multi-disciplinary discharge planning process, led by the attending physician.  Recommendations may be updated based on patient status, additional functional criteria and insurance authorization.   Follow Up Recommendations  Follow physician's recommendations for discharge plan and follow up therapies    Assistance Recommended at Discharge Intermittent Supervision/Assistance  Functional Status Assessment  Patient has had a recent decline in their functional status and demonstrates the ability to make significant improvements in function in a reasonable and predictable amount of time.  Equipment Recommendations  None recommended by OT    Recommendations for Other Services       Precautions / Restrictions Precautions Precautions: Shoulder Shoulder Interventions: Shoulder sling/immobilizer;Off for dressing/bathing/exercises Precaution Booklet Issued: Yes (comment) Required Braces or Orthoses: Sling Restrictions Weight Bearing  Restrictions: Yes RUE Weight Bearing: Non weight bearing Other Position/Activity Restrictions: pillow for positioning seated/sitting      Mobility Bed Mobility Overal bed mobility: Needs Assistance                  Transfers Overall transfer level: Needs assistance   Transfers: Sit to/from Stand Sit to Stand: Min guard                  Balance Overall balance assessment: Mild deficits observed, not formally tested                                         ADL either performed or assessed with clinical judgement   ADL                   Upper Body Dressing : Moderate assistance;Sitting   Lower Body Dressing: Moderate assistance;Sit to/from stand                       Vision Patient Visual Report: No change from baseline       Perception Perception Perception: Not tested   Praxis Praxis Praxis: Not tested    Pertinent Vitals/Pain Pain Assessment: Faces Faces Pain Scale: No hurt Pain Intervention(s): Monitored during session;Premedicated before session     Hand Dominance Right   Extremity/Trunk Assessment Upper Extremity Assessment Upper Extremity Assessment: RUE deficits/detail RUE: Unable to fully assess due to immobilization RUE Sensation: decreased light touch;decreased proprioception RUE Coordination: decreased fine motor;decreased gross motor   Lower Extremity Assessment Lower Extremity Assessment: Overall WFL for tasks assessed   Cervical / Trunk Assessment Cervical / Trunk Assessment: Other  exceptions Cervical / Trunk Exceptions: Body habitus   Communication Communication Communication: No difficulties   Cognition Arousal/Alertness: Awake/alert Behavior During Therapy: WFL for tasks assessed/performed Overall Cognitive Status: Within Functional Limits for tasks assessed                                       General Comments   VSS    Exercises Hand Exercises Forearm Supination:  PROM Forearm Pronation: PROM Wrist Flexion: PROM Wrist Extension: PROM Digit Composite Flexion: PROM Composite Extension: PROM   Shoulder Instructions Shoulder Instructions Donning/doffing shirt without moving shoulder: Minimal assistance Method for sponge bathing under operated UE: Supervision/safety Donning/doffing sling/immobilizer: Moderate assistance Correct positioning of sling/immobilizer: Supervision/safety ROM for elbow, wrist and digits of operated UE: Supervision/safety Sling wearing schedule (on at all times/off for ADL's): Independent Proper positioning of operated UE when showering: Supervision/safety Positioning of UE while sleeping: [place] expects to be discharged to:: Private residence Living Arrangements: Spouse/significant other Available Help at Discharge: Family;Available 24 hours/day Type of Home: House Home Access: Stairs to enter [employer] of Steps: 2 Entrance Stairs-Rails: Can reach both Home Layout: Two level;Able to live on main level with bedroom/bathroom     Bathroom Shower/Tub: Occupational psychologist: Standard Bathroom Accessibility: Yes How Accessible: Accessible via walker Home Equipment: Adaptive equipment;Rolling Walker (2 wheels);Rollator (4 wheels);Shower Theme park manager: Reacher        Prior Functioning/Environment Prior Level of Function : Independent/Modified Independent                        OT Problem List: Decreased range of motion;Impaired balance (sitting and/or standing)      OT Treatment/Interventions:      OT Goals(Current goals can be found in the care plan section) Acute Rehab OT Goals Patient Stated Goal: Return home OT Goal Formulation: With patient Time For Goal Achievement: 07/31/21 Potential to Achieve Goals: Good  OT Frequency:     Barriers to D/C:  None noted          Co-evaluation              AM-PAC OT "6  Clicks" Daily Activity     Outcome Measure Help from another person eating meals?: A Little Help from another person taking care of personal grooming?: A Little Help from another person toileting, which includes using toliet, bedpan, or urinal?: A Little Help from another person bathing (including washing, rinsing, drying)?: A Little Help from another person to put on and taking off regular upper body clothing?: A Lot Help from another person to put on and taking off regular lower body clothing?: A Little 6 Click Score: 17   End of Session Nurse Communication: Mobility status  Activity Tolerance: Patient tolerated treatment well Patient left: in chair;with family/visitor present  OT Visit Diagnosis: Pain Pain - Right/Left: Right Pain - part of body: Shoulder                Time: 1443-1540 OT Time Calculation (min): 24 min Charges:  OT General Charges $OT Visit: 1 Visit OT Evaluation $OT Eval Moderate Complexity: 1 Mod OT Treatments $Self Care/Home Management : 8-22 mins  07/16/2021  RP, OTR/L  Acute Rehabilitation Services  Office:  [phone]   Metta Clines 07/16/2021, 12:27 PM

## 2021-07-16 NOTE — H&P (Signed)
[name] [name] is an 70 y.o. female.   Chief Complaint: R shoulder pain and dysfunction HPI: s/p R shoulder massive RCR after dislocation with incomplete healing and chronic pain and dysfunction  Past Medical History:  Diagnosis Date   ADHD (attention deficit hyperactivity disorder)    Anemia    Anxiety    Arthritis    Depression    GERD (gastroesophageal reflux disease)    History of panic attacks    Hyperlipidemia    Hypertension    Hypothyroidism, postsurgical    Pre-diabetes    SBO (small bowel obstruction) ([place]) 01/15/2020   SUI (stress urinary incontinence, female)    Wears glasses     Past Surgical History:  Procedure Laterality Date   D & C HYSTEROSCOPY W/ RESECTION ENDOMETRIAL POLYP  04/15/2009   D & C HYSTEROSCOPY W/ RESECTOSCOPE AND THERMACHOICE ENDOMETRIAL ABLATION  12/09/2010   LAPAROSCOPIC ASSISTED VENTRAL HERNIA REPAIR Left 01/16/2020   Procedure: LAPAROSCOPIC ASSISTED HERNIA REPAIR;  Surgeon: Clovis Riley, MD;  Location: [place];  Service: General;  Laterality: Left;   PUBOVAGINAL SLING N/A 07/26/2016   Procedure: PUBO-VAGINAL SLING ALTIS SLING;  Surgeon: Carolan Clines, MD;  Location: [place];  Service: Urology;  Laterality: N/A;   REPAIR SPIGELIAN HERNIA     RESECTION GIANT CELL TUMOR RIGHT LONG FINGER  09/13/2008   ROTATOR CUFF REPAIR Bilateral    THYROIDECTOMY  1979    [facility]   non-malignant diseased thyroid   THYROIDECTOMY Left 03/17/2021   Procedure: COMPLETION THYROIDECTOMY, LEFT LOBE;  Surgeon: Armandina Gemma, MD;  Location: [facility];  Service: General;  Laterality: Left;   TOTAL KNEE ARTHROPLASTY Right 12/17/2019   Procedure: RIGHT TOTAL KNEE ARTHROPLASTY;  Surgeon: Frederik Pear, MD;  Location: [facility];  Service: Orthopedics;  Laterality: Right;   TOTAL KNEE ARTHROPLASTY Left 03/03/2020   Procedure: LEFT TOTAL KNEE ARTHROPLASTY;  Surgeon: Frederik Pear, MD;  Location: [facility];  Service: Orthopedics;  Laterality: Left;   TRIPLE  ARTHRODESIS LEFT FOOT  04/20/2007   VENTRAL HERNIA REPAIR  2003   WRIST GANGLION EXCISION Right 1975    Family History  Problem Relation Age of Onset   Breast cancer Mother    Uterine cancer Mother    Heart disease Paternal Grandfather    Diabetes Maternal Grandfather    Social History:  reports that she quit smoking about 46 years ago. Her smoking use included cigarettes. She has never used smokeless tobacco. She reports that she does not currently use alcohol. She reports that she does not use drugs.  Allergies:  Allergies  Allergen Reactions   Ceclor [Cefaclor] Hives and Shortness Of Breath   Bactrim [Sulfamethoxazole-Trimethoprim] Hives   Gabapentin Swelling    Medications Prior to Admission  Medication Sig Dispense Refill   ALPRAZolam (XANAX) 0.5 MG tablet Take 0.5 mg by mouth daily as needed for anxiety.     amoxicillin (AMOXIL) 500 MG capsule Take 2,000 mg by mouth See admin instructions. Take 4 capsule (2000 mg) by mouth 1 hour prior to dental procedure.     buPROPion (WELLBUTRIN XL) 150 MG 24 hr tablet Take 150 mg by mouth in the morning.     CALCIUM PO Take 250 mg by mouth daily. Calcium Citrate     Cholecalciferol (VITAMIN D3) 125 MCG (5000 UT) TABS Take 5,000 Units by mouth daily.     DULoxetine (CYMBALTA) 60 MG capsule Take 120 mg by mouth in the morning.  2   ibuprofen (ADVIL) 200 MG  tablet Take 600 mg by mouth at bedtime.     irbesartan (AVAPRO) 150 MG tablet Take 75 mg by mouth in the morning.     levothyroxine (SYNTHROID, LEVOTHROID) 125 MCG tablet Take 125 mcg by mouth daily before breakfast.   0   lisdexamfetamine (VYVANSE) 50 MG capsule Take 50 mg by mouth in the morning.     MYRBETRIQ 50 MG TB24 tablet Take 50 mg by mouth in the morning.     omeprazole (PRILOSEC) 20 MG capsule Take 20 mg by mouth daily before breakfast.      simvastatin (ZOCOR) 40 MG tablet Take 40 mg by mouth in the morning.  2    Results for orders placed or performed during the hospital  encounter of 07/16/21 (from the past 48 hour(s))  Glucose, capillary     Status: Abnormal   Collection Time: 07/16/21  6:19 AM  Result Value Ref Range   Glucose-Capillary 104 (H) 70 - 99 mg/dL    Comment: Glucose reference range applies only to samples taken after fasting for at least 8 hours.   No results found.  Review of Systems  All other systems reviewed and are negative.  Blood pressure (!) 159/75, pulse 76, temperature 98.4 F (36.9 C), temperature source Oral, resp. rate 16, weight 124.4 kg, SpO2 96 %. Physical Exam HENT:     Head: Atraumatic.  Eyes:     Extraocular Movements: Extraocular movements intact.  Cardiovascular:     Pulses: Normal pulses.  Pulmonary:     Effort: Pulmonary effort is normal.  Musculoskeletal:     Comments: R shoulder pain with limited ROM. NVID  Neurological:     Mental Status: She is alert.  Psychiatric:        Mood and Affect: Mood normal.     Assessment/Plan R shoulder chronic irreparable RCR with ongoing pain and dysfunction Plan R reverse TSA Risks / benefits of surgery discussed Consent on chart  NPO for OR Preop antibiotics   Rhae Hammock, MD 07/16/2021, 7:12 AM

## 2021-07-16 NOTE — Transfer of Care (Signed)
Immediate Anesthesia Transfer of Care Note  Patient: [name] [name]  Procedure(s) Performed: REVERSE SHOULDER ARTHROPLASTY (Right: Shoulder)  Patient Location: PACU  Anesthesia Type:General and Regional  Level of Consciousness: awake, alert  and oriented  Airway & Oxygen Therapy: Patient Spontanous Breathing and Patient connected to face mask oxygen  Post-op Assessment: Report given to RN and Post -op Vital signs reviewed and stable  Post vital signs: Reviewed and stable  Last Vitals:  Vitals Value Taken Time  BP    Temp    Pulse 66 07/16/21 0921  Resp 16 07/16/21 0921  SpO2 99 % 07/16/21 0921  Vitals shown include unvalidated device data.  Last Pain:  Vitals:   07/16/21 0628  TempSrc: Oral         Complications: No notable events documented.

## 2021-07-18 NOTE — Anesthesia Postprocedure Evaluation (Signed)
Anesthesia Post Note  Patient: [name] [name]  Procedure(s) Performed: REVERSE SHOULDER ARTHROPLASTY (Right: Shoulder)     Patient location during evaluation: PACU Anesthesia Type: General and Regional Level of consciousness: awake and alert Pain management: pain level controlled Vital Signs Assessment: post-procedure vital signs reviewed and stable Respiratory status: spontaneous breathing, nonlabored ventilation, respiratory function stable and patient connected to nasal cannula oxygen Cardiovascular status: blood pressure returned to baseline and stable Postop Assessment: no apparent nausea or vomiting Anesthetic complications: no   No notable events documented.  Last Vitals:  Vitals:   07/16/21 1000 07/16/21 1015  BP: (!) 109/56 (!) 114/48  Pulse: 66 70  Resp: 17 (!) 21  Temp:  (!) 36.3 C  SpO2: 91% 93%    Last Pain:  Vitals:   07/16/21 1015  TempSrc:   PainSc: 0-No pain                 Brynna Dobos

## 2021-07-27 ENCOUNTER — Encounter (HOSPITAL_COMMUNITY): Payer: Self-pay | Admitting: Orthopedic Surgery

## 2021-07-29 DIAGNOSIS — S46011A Strain of muscle(s) and tendon(s) of the rotator cuff of right shoulder, initial encounter: Secondary | ICD-10-CM | POA: Diagnosis not present

## 2021-07-29 DIAGNOSIS — Z9889 Other specified postprocedural states: Secondary | ICD-10-CM | POA: Diagnosis not present

## 2021-08-06 DIAGNOSIS — F900 Attention-deficit hyperactivity disorder, predominantly inattentive type: Secondary | ICD-10-CM | POA: Diagnosis not present

## 2021-08-06 DIAGNOSIS — F419 Anxiety disorder, unspecified: Secondary | ICD-10-CM | POA: Diagnosis not present

## 2021-08-06 DIAGNOSIS — F330 Major depressive disorder, recurrent, mild: Secondary | ICD-10-CM | POA: Diagnosis not present

## 2021-08-10 DIAGNOSIS — U071 COVID-19: Secondary | ICD-10-CM | POA: Diagnosis not present

## 2021-08-26 DIAGNOSIS — M25511 Pain in right shoulder: Secondary | ICD-10-CM | POA: Diagnosis not present

## 2021-08-26 DIAGNOSIS — Z9889 Other specified postprocedural states: Secondary | ICD-10-CM | POA: Diagnosis not present

## 2021-08-27 DIAGNOSIS — R531 Weakness: Secondary | ICD-10-CM | POA: Diagnosis not present

## 2021-08-27 DIAGNOSIS — Z9889 Other specified postprocedural states: Secondary | ICD-10-CM | POA: Diagnosis not present

## 2021-08-27 DIAGNOSIS — Z96611 Presence of right artificial shoulder joint: Secondary | ICD-10-CM | POA: Diagnosis not present

## 2021-08-27 DIAGNOSIS — M25611 Stiffness of right shoulder, not elsewhere classified: Secondary | ICD-10-CM | POA: Diagnosis not present

## 2021-09-01 DIAGNOSIS — M25611 Stiffness of right shoulder, not elsewhere classified: Secondary | ICD-10-CM | POA: Diagnosis not present

## 2021-09-01 DIAGNOSIS — Z96611 Presence of right artificial shoulder joint: Secondary | ICD-10-CM | POA: Diagnosis not present

## 2021-09-01 DIAGNOSIS — R531 Weakness: Secondary | ICD-10-CM | POA: Diagnosis not present

## 2021-09-01 DIAGNOSIS — Z9889 Other specified postprocedural states: Secondary | ICD-10-CM | POA: Diagnosis not present

## 2021-09-01 DIAGNOSIS — E039 Hypothyroidism, unspecified: Secondary | ICD-10-CM | POA: Diagnosis not present

## 2021-09-03 DIAGNOSIS — E039 Hypothyroidism, unspecified: Secondary | ICD-10-CM | POA: Diagnosis not present

## 2021-09-03 DIAGNOSIS — G4700 Insomnia, unspecified: Secondary | ICD-10-CM | POA: Diagnosis not present

## 2021-09-03 DIAGNOSIS — I10 Essential (primary) hypertension: Secondary | ICD-10-CM | POA: Diagnosis not present

## 2021-09-03 DIAGNOSIS — E7800 Pure hypercholesterolemia, unspecified: Secondary | ICD-10-CM | POA: Diagnosis not present

## 2021-09-04 DIAGNOSIS — M25611 Stiffness of right shoulder, not elsewhere classified: Secondary | ICD-10-CM | POA: Diagnosis not present

## 2021-09-04 DIAGNOSIS — Z9889 Other specified postprocedural states: Secondary | ICD-10-CM | POA: Diagnosis not present

## 2021-09-04 DIAGNOSIS — Z96611 Presence of right artificial shoulder joint: Secondary | ICD-10-CM | POA: Diagnosis not present

## 2021-09-04 DIAGNOSIS — R531 Weakness: Secondary | ICD-10-CM | POA: Diagnosis not present

## 2021-09-07 ENCOUNTER — Other Ambulatory Visit: Payer: Self-pay | Admitting: Gastroenterology

## 2021-09-07 DIAGNOSIS — Z9889 Other specified postprocedural states: Secondary | ICD-10-CM | POA: Diagnosis not present

## 2021-09-07 DIAGNOSIS — Z96611 Presence of right artificial shoulder joint: Secondary | ICD-10-CM | POA: Diagnosis not present

## 2021-09-07 DIAGNOSIS — M25611 Stiffness of right shoulder, not elsewhere classified: Secondary | ICD-10-CM | POA: Diagnosis not present

## 2021-09-07 DIAGNOSIS — R1310 Dysphagia, unspecified: Secondary | ICD-10-CM

## 2021-09-07 DIAGNOSIS — R531 Weakness: Secondary | ICD-10-CM | POA: Diagnosis not present

## 2021-09-07 DIAGNOSIS — Z86010 Personal history of colonic polyps: Secondary | ICD-10-CM | POA: Diagnosis not present

## 2021-09-07 DIAGNOSIS — K219 Gastro-esophageal reflux disease without esophagitis: Secondary | ICD-10-CM | POA: Diagnosis not present

## 2021-09-09 DIAGNOSIS — Z96611 Presence of right artificial shoulder joint: Secondary | ICD-10-CM | POA: Diagnosis not present

## 2021-09-09 DIAGNOSIS — Z9889 Other specified postprocedural states: Secondary | ICD-10-CM | POA: Diagnosis not present

## 2021-09-09 DIAGNOSIS — M25611 Stiffness of right shoulder, not elsewhere classified: Secondary | ICD-10-CM | POA: Diagnosis not present

## 2021-09-09 DIAGNOSIS — R531 Weakness: Secondary | ICD-10-CM | POA: Diagnosis not present

## 2021-09-11 ENCOUNTER — Ambulatory Visit
Admission: RE | Admit: 2021-09-11 | Discharge: 2021-09-11 | Disposition: A | Discharge status: Home / Self Care | Payer: PPO | Source: Ambulatory Visit | Attending: Gastroenterology | Admitting: Gastroenterology

## 2021-09-11 DIAGNOSIS — K449 Diaphragmatic hernia without obstruction or gangrene: Secondary | ICD-10-CM | POA: Diagnosis not present

## 2021-09-11 DIAGNOSIS — R1310 Dysphagia, unspecified: Secondary | ICD-10-CM | POA: Diagnosis not present

## 2021-09-11 DIAGNOSIS — K219 Gastro-esophageal reflux disease without esophagitis: Secondary | ICD-10-CM | POA: Diagnosis not present

## 2021-09-15 DIAGNOSIS — Z9889 Other specified postprocedural states: Secondary | ICD-10-CM | POA: Diagnosis not present

## 2021-09-15 DIAGNOSIS — Z96611 Presence of right artificial shoulder joint: Secondary | ICD-10-CM | POA: Diagnosis not present

## 2021-09-15 DIAGNOSIS — R531 Weakness: Secondary | ICD-10-CM | POA: Diagnosis not present

## 2021-09-15 DIAGNOSIS — M25611 Stiffness of right shoulder, not elsewhere classified: Secondary | ICD-10-CM | POA: Diagnosis not present

## 2021-09-18 DIAGNOSIS — Z96611 Presence of right artificial shoulder joint: Secondary | ICD-10-CM | POA: Diagnosis not present

## 2021-09-18 DIAGNOSIS — R531 Weakness: Secondary | ICD-10-CM | POA: Diagnosis not present

## 2021-09-18 DIAGNOSIS — M25611 Stiffness of right shoulder, not elsewhere classified: Secondary | ICD-10-CM | POA: Diagnosis not present

## 2021-09-18 DIAGNOSIS — Z9889 Other specified postprocedural states: Secondary | ICD-10-CM | POA: Diagnosis not present

## 2021-09-22 DIAGNOSIS — R531 Weakness: Secondary | ICD-10-CM | POA: Diagnosis not present

## 2021-09-22 DIAGNOSIS — Z9889 Other specified postprocedural states: Secondary | ICD-10-CM | POA: Diagnosis not present

## 2021-09-22 DIAGNOSIS — Z96611 Presence of right artificial shoulder joint: Secondary | ICD-10-CM | POA: Diagnosis not present

## 2021-09-22 DIAGNOSIS — M25611 Stiffness of right shoulder, not elsewhere classified: Secondary | ICD-10-CM | POA: Diagnosis not present

## 2021-09-25 DIAGNOSIS — R531 Weakness: Secondary | ICD-10-CM | POA: Diagnosis not present

## 2021-09-25 DIAGNOSIS — Z9889 Other specified postprocedural states: Secondary | ICD-10-CM | POA: Diagnosis not present

## 2021-09-25 DIAGNOSIS — Z96611 Presence of right artificial shoulder joint: Secondary | ICD-10-CM | POA: Diagnosis not present

## 2021-09-25 DIAGNOSIS — M25611 Stiffness of right shoulder, not elsewhere classified: Secondary | ICD-10-CM | POA: Diagnosis not present

## 2021-09-29 DIAGNOSIS — Z96611 Presence of right artificial shoulder joint: Secondary | ICD-10-CM | POA: Diagnosis not present

## 2021-09-29 DIAGNOSIS — Z9889 Other specified postprocedural states: Secondary | ICD-10-CM | POA: Diagnosis not present

## 2021-09-29 DIAGNOSIS — M25611 Stiffness of right shoulder, not elsewhere classified: Secondary | ICD-10-CM | POA: Diagnosis not present

## 2021-09-29 DIAGNOSIS — R531 Weakness: Secondary | ICD-10-CM | POA: Diagnosis not present

## 2021-09-30 DIAGNOSIS — R350 Frequency of micturition: Secondary | ICD-10-CM | POA: Diagnosis not present

## 2021-09-30 DIAGNOSIS — N3946 Mixed incontinence: Secondary | ICD-10-CM | POA: Diagnosis not present

## 2021-10-06 DIAGNOSIS — Z96611 Presence of right artificial shoulder joint: Secondary | ICD-10-CM | POA: Diagnosis not present

## 2021-10-06 DIAGNOSIS — Z9889 Other specified postprocedural states: Secondary | ICD-10-CM | POA: Diagnosis not present

## 2021-10-06 DIAGNOSIS — M25611 Stiffness of right shoulder, not elsewhere classified: Secondary | ICD-10-CM | POA: Diagnosis not present

## 2021-10-06 DIAGNOSIS — R531 Weakness: Secondary | ICD-10-CM | POA: Diagnosis not present

## 2021-10-07 DIAGNOSIS — M25611 Stiffness of right shoulder, not elsewhere classified: Secondary | ICD-10-CM | POA: Diagnosis not present

## 2021-10-07 DIAGNOSIS — Z9889 Other specified postprocedural states: Secondary | ICD-10-CM | POA: Diagnosis not present

## 2021-10-09 DIAGNOSIS — R531 Weakness: Secondary | ICD-10-CM | POA: Diagnosis not present

## 2021-10-09 DIAGNOSIS — Z9889 Other specified postprocedural states: Secondary | ICD-10-CM | POA: Diagnosis not present

## 2021-10-09 DIAGNOSIS — Z96611 Presence of right artificial shoulder joint: Secondary | ICD-10-CM | POA: Diagnosis not present

## 2021-10-09 DIAGNOSIS — M25611 Stiffness of right shoulder, not elsewhere classified: Secondary | ICD-10-CM | POA: Diagnosis not present

## 2021-10-14 DIAGNOSIS — R531 Weakness: Secondary | ICD-10-CM | POA: Diagnosis not present

## 2021-10-14 DIAGNOSIS — Z96611 Presence of right artificial shoulder joint: Secondary | ICD-10-CM | POA: Diagnosis not present

## 2021-10-14 DIAGNOSIS — M25611 Stiffness of right shoulder, not elsewhere classified: Secondary | ICD-10-CM | POA: Diagnosis not present

## 2021-10-14 DIAGNOSIS — Z9889 Other specified postprocedural states: Secondary | ICD-10-CM | POA: Diagnosis not present

## 2021-10-20 DIAGNOSIS — M25611 Stiffness of right shoulder, not elsewhere classified: Secondary | ICD-10-CM | POA: Diagnosis not present

## 2021-10-20 DIAGNOSIS — Z96611 Presence of right artificial shoulder joint: Secondary | ICD-10-CM | POA: Diagnosis not present

## 2021-10-20 DIAGNOSIS — R531 Weakness: Secondary | ICD-10-CM | POA: Diagnosis not present

## 2021-10-20 DIAGNOSIS — Z9889 Other specified postprocedural states: Secondary | ICD-10-CM | POA: Diagnosis not present

## 2021-10-23 DIAGNOSIS — Z9889 Other specified postprocedural states: Secondary | ICD-10-CM | POA: Diagnosis not present

## 2021-10-23 DIAGNOSIS — Z96611 Presence of right artificial shoulder joint: Secondary | ICD-10-CM | POA: Diagnosis not present

## 2021-10-23 DIAGNOSIS — R531 Weakness: Secondary | ICD-10-CM | POA: Diagnosis not present

## 2021-10-23 DIAGNOSIS — M25611 Stiffness of right shoulder, not elsewhere classified: Secondary | ICD-10-CM | POA: Diagnosis not present

## 2021-10-28 DIAGNOSIS — D120 Benign neoplasm of cecum: Secondary | ICD-10-CM | POA: Diagnosis not present

## 2021-10-28 DIAGNOSIS — D122 Benign neoplasm of ascending colon: Secondary | ICD-10-CM | POA: Diagnosis not present

## 2021-10-28 DIAGNOSIS — K222 Esophageal obstruction: Secondary | ICD-10-CM | POA: Diagnosis not present

## 2021-10-28 DIAGNOSIS — D123 Benign neoplasm of transverse colon: Secondary | ICD-10-CM | POA: Diagnosis not present

## 2021-10-28 DIAGNOSIS — K449 Diaphragmatic hernia without obstruction or gangrene: Secondary | ICD-10-CM | POA: Diagnosis not present

## 2021-10-28 DIAGNOSIS — Z1211 Encounter for screening for malignant neoplasm of colon: Secondary | ICD-10-CM | POA: Diagnosis not present

## 2021-10-28 DIAGNOSIS — K2289 Other specified disease of esophagus: Secondary | ICD-10-CM | POA: Diagnosis not present

## 2021-10-28 DIAGNOSIS — K317 Polyp of stomach and duodenum: Secondary | ICD-10-CM | POA: Diagnosis not present

## 2021-10-28 DIAGNOSIS — R1310 Dysphagia, unspecified: Secondary | ICD-10-CM | POA: Diagnosis not present

## 2021-10-28 DIAGNOSIS — K5730 Diverticulosis of large intestine without perforation or abscess without bleeding: Secondary | ICD-10-CM | POA: Diagnosis not present

## 2021-10-30 DIAGNOSIS — K2289 Other specified disease of esophagus: Secondary | ICD-10-CM | POA: Diagnosis not present

## 2021-10-30 DIAGNOSIS — M25611 Stiffness of right shoulder, not elsewhere classified: Secondary | ICD-10-CM | POA: Diagnosis not present

## 2021-10-30 DIAGNOSIS — D120 Benign neoplasm of cecum: Secondary | ICD-10-CM | POA: Diagnosis not present

## 2021-10-30 DIAGNOSIS — D122 Benign neoplasm of ascending colon: Secondary | ICD-10-CM | POA: Diagnosis not present

## 2021-10-30 DIAGNOSIS — Z9889 Other specified postprocedural states: Secondary | ICD-10-CM | POA: Diagnosis not present

## 2021-10-30 DIAGNOSIS — R531 Weakness: Secondary | ICD-10-CM | POA: Diagnosis not present

## 2021-10-30 DIAGNOSIS — D123 Benign neoplasm of transverse colon: Secondary | ICD-10-CM | POA: Diagnosis not present

## 2021-10-30 DIAGNOSIS — Z96611 Presence of right artificial shoulder joint: Secondary | ICD-10-CM | POA: Diagnosis not present

## 2021-11-02 DIAGNOSIS — H40013 Open angle with borderline findings, low risk, bilateral: Secondary | ICD-10-CM | POA: Diagnosis not present

## 2021-11-03 DIAGNOSIS — M25611 Stiffness of right shoulder, not elsewhere classified: Secondary | ICD-10-CM | POA: Diagnosis not present

## 2021-11-03 DIAGNOSIS — Z96611 Presence of right artificial shoulder joint: Secondary | ICD-10-CM | POA: Diagnosis not present

## 2021-11-03 DIAGNOSIS — Z9889 Other specified postprocedural states: Secondary | ICD-10-CM | POA: Diagnosis not present

## 2021-11-03 DIAGNOSIS — R531 Weakness: Secondary | ICD-10-CM | POA: Diagnosis not present

## 2021-11-05 DIAGNOSIS — F330 Major depressive disorder, recurrent, mild: Secondary | ICD-10-CM | POA: Diagnosis not present

## 2021-11-05 DIAGNOSIS — F419 Anxiety disorder, unspecified: Secondary | ICD-10-CM | POA: Diagnosis not present

## 2021-11-05 DIAGNOSIS — F900 Attention-deficit hyperactivity disorder, predominantly inattentive type: Secondary | ICD-10-CM | POA: Diagnosis not present

## 2021-11-10 DIAGNOSIS — Z96611 Presence of right artificial shoulder joint: Secondary | ICD-10-CM | POA: Diagnosis not present

## 2021-11-10 DIAGNOSIS — M25611 Stiffness of right shoulder, not elsewhere classified: Secondary | ICD-10-CM | POA: Diagnosis not present

## 2021-11-10 DIAGNOSIS — R531 Weakness: Secondary | ICD-10-CM | POA: Diagnosis not present

## 2021-11-10 DIAGNOSIS — Z9889 Other specified postprocedural states: Secondary | ICD-10-CM | POA: Diagnosis not present

## 2021-11-12 DIAGNOSIS — E039 Hypothyroidism, unspecified: Secondary | ICD-10-CM | POA: Diagnosis not present

## 2021-11-18 ENCOUNTER — Other Ambulatory Visit: Payer: PPO

## 2021-11-18 DIAGNOSIS — Z9889 Other specified postprocedural states: Secondary | ICD-10-CM | POA: Diagnosis not present

## 2021-11-18 DIAGNOSIS — M25611 Stiffness of right shoulder, not elsewhere classified: Secondary | ICD-10-CM | POA: Diagnosis not present

## 2021-11-18 DIAGNOSIS — Z96611 Presence of right artificial shoulder joint: Secondary | ICD-10-CM | POA: Diagnosis not present

## 2021-11-18 DIAGNOSIS — R531 Weakness: Secondary | ICD-10-CM | POA: Diagnosis not present

## 2021-11-24 DIAGNOSIS — Z96611 Presence of right artificial shoulder joint: Secondary | ICD-10-CM | POA: Diagnosis not present

## 2021-11-24 DIAGNOSIS — M25611 Stiffness of right shoulder, not elsewhere classified: Secondary | ICD-10-CM | POA: Diagnosis not present

## 2021-11-24 DIAGNOSIS — Z9889 Other specified postprocedural states: Secondary | ICD-10-CM | POA: Diagnosis not present

## 2021-11-24 DIAGNOSIS — R531 Weakness: Secondary | ICD-10-CM | POA: Diagnosis not present

## 2021-11-26 DIAGNOSIS — F900 Attention-deficit hyperactivity disorder, predominantly inattentive type: Secondary | ICD-10-CM | POA: Diagnosis not present

## 2021-11-26 DIAGNOSIS — E7800 Pure hypercholesterolemia, unspecified: Secondary | ICD-10-CM | POA: Diagnosis not present

## 2021-11-26 DIAGNOSIS — K219 Gastro-esophageal reflux disease without esophagitis: Secondary | ICD-10-CM | POA: Diagnosis not present

## 2021-11-26 DIAGNOSIS — R7303 Prediabetes: Secondary | ICD-10-CM | POA: Diagnosis not present

## 2021-11-26 DIAGNOSIS — F331 Major depressive disorder, recurrent, moderate: Secondary | ICD-10-CM | POA: Diagnosis not present

## 2021-11-26 DIAGNOSIS — N3281 Overactive bladder: Secondary | ICD-10-CM | POA: Diagnosis not present

## 2021-11-26 DIAGNOSIS — E039 Hypothyroidism, unspecified: Secondary | ICD-10-CM | POA: Diagnosis not present

## 2021-11-26 DIAGNOSIS — M8580 Other specified disorders of bone density and structure, unspecified site: Secondary | ICD-10-CM | POA: Diagnosis not present

## 2021-11-26 DIAGNOSIS — I10 Essential (primary) hypertension: Secondary | ICD-10-CM | POA: Diagnosis not present

## 2021-11-26 DIAGNOSIS — Z0000 Encounter for general adult medical examination without abnormal findings: Secondary | ICD-10-CM | POA: Diagnosis not present

## 2021-11-26 DIAGNOSIS — G4700 Insomnia, unspecified: Secondary | ICD-10-CM | POA: Diagnosis not present

## 2021-12-02 DIAGNOSIS — I10 Essential (primary) hypertension: Secondary | ICD-10-CM | POA: Diagnosis not present

## 2021-12-02 DIAGNOSIS — M8580 Other specified disorders of bone density and structure, unspecified site: Secondary | ICD-10-CM | POA: Diagnosis not present

## 2021-12-02 DIAGNOSIS — E7800 Pure hypercholesterolemia, unspecified: Secondary | ICD-10-CM | POA: Diagnosis not present

## 2021-12-02 DIAGNOSIS — E039 Hypothyroidism, unspecified: Secondary | ICD-10-CM | POA: Diagnosis not present

## 2021-12-03 ENCOUNTER — Ambulatory Visit
Admission: RE | Admit: 2021-12-03 | Discharge: 2021-12-03 | Disposition: A | Discharge status: Home / Self Care | Payer: PPO | Source: Ambulatory Visit | Attending: Family Medicine | Admitting: Family Medicine

## 2021-12-03 DIAGNOSIS — M85832 Other specified disorders of bone density and structure, left forearm: Secondary | ICD-10-CM | POA: Diagnosis not present

## 2021-12-03 DIAGNOSIS — E2839 Other primary ovarian failure: Secondary | ICD-10-CM

## 2021-12-03 DIAGNOSIS — Z780 Asymptomatic menopausal state: Secondary | ICD-10-CM | POA: Diagnosis not present

## 2021-12-04 DIAGNOSIS — Z9889 Other specified postprocedural states: Secondary | ICD-10-CM | POA: Diagnosis not present

## 2021-12-04 DIAGNOSIS — Z96611 Presence of right artificial shoulder joint: Secondary | ICD-10-CM | POA: Diagnosis not present

## 2021-12-04 DIAGNOSIS — R531 Weakness: Secondary | ICD-10-CM | POA: Diagnosis not present

## 2021-12-04 DIAGNOSIS — M25611 Stiffness of right shoulder, not elsewhere classified: Secondary | ICD-10-CM | POA: Diagnosis not present

## 2021-12-15 DIAGNOSIS — F4310 Post-traumatic stress disorder, unspecified: Secondary | ICD-10-CM | POA: Diagnosis not present

## 2021-12-22 DIAGNOSIS — H903 Sensorineural hearing loss, bilateral: Secondary | ICD-10-CM | POA: Diagnosis not present

## 2022-01-06 DIAGNOSIS — Z96611 Presence of right artificial shoulder joint: Secondary | ICD-10-CM | POA: Diagnosis not present

## 2022-01-06 DIAGNOSIS — Z471 Aftercare following joint replacement surgery: Secondary | ICD-10-CM | POA: Diagnosis not present

## 2022-01-15 DIAGNOSIS — E039 Hypothyroidism, unspecified: Secondary | ICD-10-CM | POA: Diagnosis not present

## 2022-01-25 DIAGNOSIS — F330 Major depressive disorder, recurrent, mild: Secondary | ICD-10-CM | POA: Diagnosis not present

## 2022-01-25 DIAGNOSIS — F900 Attention-deficit hyperactivity disorder, predominantly inattentive type: Secondary | ICD-10-CM | POA: Diagnosis not present

## 2022-01-25 DIAGNOSIS — F419 Anxiety disorder, unspecified: Secondary | ICD-10-CM | POA: Diagnosis not present

## 2022-01-26 DIAGNOSIS — F4310 Post-traumatic stress disorder, unspecified: Secondary | ICD-10-CM | POA: Diagnosis not present

## 2022-02-04 DIAGNOSIS — H1032 Unspecified acute conjunctivitis, left eye: Secondary | ICD-10-CM | POA: Diagnosis not present

## 2022-02-23 DIAGNOSIS — H25013 Cortical age-related cataract, bilateral: Secondary | ICD-10-CM | POA: Diagnosis not present

## 2022-02-23 DIAGNOSIS — H2513 Age-related nuclear cataract, bilateral: Secondary | ICD-10-CM | POA: Diagnosis not present

## 2022-02-23 DIAGNOSIS — H18413 Arcus senilis, bilateral: Secondary | ICD-10-CM | POA: Diagnosis not present

## 2022-02-23 DIAGNOSIS — H25043 Posterior subcapsular polar age-related cataract, bilateral: Secondary | ICD-10-CM | POA: Diagnosis not present

## 2022-02-23 DIAGNOSIS — H2512 Age-related nuclear cataract, left eye: Secondary | ICD-10-CM | POA: Diagnosis not present

## 2022-02-24 DIAGNOSIS — F4310 Post-traumatic stress disorder, unspecified: Secondary | ICD-10-CM | POA: Diagnosis not present

## 2022-02-25 DIAGNOSIS — H903 Sensorineural hearing loss, bilateral: Secondary | ICD-10-CM | POA: Diagnosis not present

## 2022-02-26 DIAGNOSIS — F419 Anxiety disorder, unspecified: Secondary | ICD-10-CM | POA: Diagnosis not present

## 2022-02-26 DIAGNOSIS — F900 Attention-deficit hyperactivity disorder, predominantly inattentive type: Secondary | ICD-10-CM | POA: Diagnosis not present

## 2022-02-26 DIAGNOSIS — F330 Major depressive disorder, recurrent, mild: Secondary | ICD-10-CM | POA: Diagnosis not present

## 2022-03-03 DIAGNOSIS — F4310 Post-traumatic stress disorder, unspecified: Secondary | ICD-10-CM | POA: Diagnosis not present

## 2022-03-10 DIAGNOSIS — E039 Hypothyroidism, unspecified: Secondary | ICD-10-CM | POA: Diagnosis not present

## 2022-03-17 DIAGNOSIS — F4310 Post-traumatic stress disorder, unspecified: Secondary | ICD-10-CM | POA: Diagnosis not present

## 2022-03-17 DIAGNOSIS — S0502XA Injury of conjunctiva and corneal abrasion without foreign body, left eye, initial encounter: Secondary | ICD-10-CM | POA: Diagnosis not present

## 2022-03-18 DIAGNOSIS — S0502XD Injury of conjunctiva and corneal abrasion without foreign body, left eye, subsequent encounter: Secondary | ICD-10-CM | POA: Diagnosis not present

## 2022-03-22 DIAGNOSIS — S0502XS Injury of conjunctiva and corneal abrasion without foreign body, left eye, sequela: Secondary | ICD-10-CM | POA: Diagnosis not present

## 2022-03-24 DIAGNOSIS — F4310 Post-traumatic stress disorder, unspecified: Secondary | ICD-10-CM | POA: Diagnosis not present

## 2022-03-31 DIAGNOSIS — F4310 Post-traumatic stress disorder, unspecified: Secondary | ICD-10-CM | POA: Diagnosis not present

## 2022-03-31 DIAGNOSIS — Z6841 Body Mass Index (BMI) 40.0 and over, adult: Secondary | ICD-10-CM | POA: Diagnosis not present

## 2022-03-31 DIAGNOSIS — F331 Major depressive disorder, recurrent, moderate: Secondary | ICD-10-CM | POA: Diagnosis not present

## 2022-03-31 DIAGNOSIS — E039 Hypothyroidism, unspecified: Secondary | ICD-10-CM | POA: Diagnosis not present

## 2022-03-31 DIAGNOSIS — F419 Anxiety disorder, unspecified: Secondary | ICD-10-CM | POA: Diagnosis not present

## 2022-03-31 DIAGNOSIS — F909 Attention-deficit hyperactivity disorder, unspecified type: Secondary | ICD-10-CM | POA: Diagnosis not present

## 2022-03-31 DIAGNOSIS — K219 Gastro-esophageal reflux disease without esophagitis: Secondary | ICD-10-CM | POA: Diagnosis not present

## 2022-03-31 DIAGNOSIS — I10 Essential (primary) hypertension: Secondary | ICD-10-CM | POA: Diagnosis not present

## 2022-03-31 DIAGNOSIS — E785 Hyperlipidemia, unspecified: Secondary | ICD-10-CM | POA: Diagnosis not present

## 2022-03-31 DIAGNOSIS — E559 Vitamin D deficiency, unspecified: Secondary | ICD-10-CM | POA: Diagnosis not present

## 2022-03-31 DIAGNOSIS — H9193 Unspecified hearing loss, bilateral: Secondary | ICD-10-CM | POA: Diagnosis not present

## 2022-03-31 DIAGNOSIS — F908 Attention-deficit hyperactivity disorder, other type: Secondary | ICD-10-CM | POA: Diagnosis not present

## 2022-04-07 DIAGNOSIS — F4310 Post-traumatic stress disorder, unspecified: Secondary | ICD-10-CM | POA: Diagnosis not present

## 2022-04-14 DIAGNOSIS — F4310 Post-traumatic stress disorder, unspecified: Secondary | ICD-10-CM | POA: Diagnosis not present

## 2022-04-21 DIAGNOSIS — F4310 Post-traumatic stress disorder, unspecified: Secondary | ICD-10-CM | POA: Diagnosis not present

## 2022-04-26 DIAGNOSIS — F900 Attention-deficit hyperactivity disorder, predominantly inattentive type: Secondary | ICD-10-CM | POA: Diagnosis not present

## 2022-04-26 DIAGNOSIS — F330 Major depressive disorder, recurrent, mild: Secondary | ICD-10-CM | POA: Diagnosis not present

## 2022-04-26 DIAGNOSIS — F419 Anxiety disorder, unspecified: Secondary | ICD-10-CM | POA: Diagnosis not present

## 2022-04-28 DIAGNOSIS — F4310 Post-traumatic stress disorder, unspecified: Secondary | ICD-10-CM | POA: Diagnosis not present

## 2022-05-05 DIAGNOSIS — F4310 Post-traumatic stress disorder, unspecified: Secondary | ICD-10-CM | POA: Diagnosis not present

## 2022-05-12 DIAGNOSIS — F4310 Post-traumatic stress disorder, unspecified: Secondary | ICD-10-CM | POA: Diagnosis not present

## 2022-05-13 DIAGNOSIS — E039 Hypothyroidism, unspecified: Secondary | ICD-10-CM | POA: Diagnosis not present

## 2022-05-17 DIAGNOSIS — H2511 Age-related nuclear cataract, right eye: Secondary | ICD-10-CM | POA: Diagnosis not present

## 2022-05-18 DIAGNOSIS — H2512 Age-related nuclear cataract, left eye: Secondary | ICD-10-CM | POA: Diagnosis not present

## 2022-05-20 DIAGNOSIS — Z23 Encounter for immunization: Secondary | ICD-10-CM | POA: Diagnosis not present

## 2022-05-20 DIAGNOSIS — E039 Hypothyroidism, unspecified: Secondary | ICD-10-CM | POA: Diagnosis not present

## 2022-05-31 DIAGNOSIS — H2512 Age-related nuclear cataract, left eye: Secondary | ICD-10-CM | POA: Diagnosis not present

## 2022-06-24 DIAGNOSIS — E7800 Pure hypercholesterolemia, unspecified: Secondary | ICD-10-CM | POA: Diagnosis not present

## 2022-06-24 DIAGNOSIS — R5383 Other fatigue: Secondary | ICD-10-CM | POA: Diagnosis not present

## 2022-06-24 DIAGNOSIS — G4700 Insomnia, unspecified: Secondary | ICD-10-CM | POA: Diagnosis not present

## 2022-06-24 DIAGNOSIS — F900 Attention-deficit hyperactivity disorder, predominantly inattentive type: Secondary | ICD-10-CM | POA: Diagnosis not present

## 2022-06-24 DIAGNOSIS — I10 Essential (primary) hypertension: Secondary | ICD-10-CM | POA: Diagnosis not present

## 2022-06-24 DIAGNOSIS — M8588 Other specified disorders of bone density and structure, other site: Secondary | ICD-10-CM | POA: Diagnosis not present

## 2022-06-24 DIAGNOSIS — R7303 Prediabetes: Secondary | ICD-10-CM | POA: Diagnosis not present

## 2022-06-24 DIAGNOSIS — N3281 Overactive bladder: Secondary | ICD-10-CM | POA: Diagnosis not present

## 2022-06-24 DIAGNOSIS — F331 Major depressive disorder, recurrent, moderate: Secondary | ICD-10-CM | POA: Diagnosis not present

## 2022-06-24 DIAGNOSIS — E039 Hypothyroidism, unspecified: Secondary | ICD-10-CM | POA: Diagnosis not present

## 2022-06-24 DIAGNOSIS — F419 Anxiety disorder, unspecified: Secondary | ICD-10-CM | POA: Diagnosis not present

## 2022-07-26 DIAGNOSIS — F900 Attention-deficit hyperactivity disorder, predominantly inattentive type: Secondary | ICD-10-CM | POA: Diagnosis not present

## 2022-07-26 DIAGNOSIS — F330 Major depressive disorder, recurrent, mild: Secondary | ICD-10-CM | POA: Diagnosis not present

## 2022-07-26 DIAGNOSIS — F419 Anxiety disorder, unspecified: Secondary | ICD-10-CM | POA: Diagnosis not present

## 2022-07-28 ENCOUNTER — Other Ambulatory Visit: Payer: Self-pay | Admitting: Family Medicine

## 2022-07-28 DIAGNOSIS — Z1231 Encounter for screening mammogram for malignant neoplasm of breast: Secondary | ICD-10-CM

## 2022-07-29 DIAGNOSIS — E039 Hypothyroidism, unspecified: Secondary | ICD-10-CM | POA: Diagnosis not present

## 2022-08-04 DIAGNOSIS — E039 Hypothyroidism, unspecified: Secondary | ICD-10-CM | POA: Diagnosis not present

## 2022-08-18 DIAGNOSIS — I10 Essential (primary) hypertension: Secondary | ICD-10-CM | POA: Diagnosis not present

## 2022-08-18 DIAGNOSIS — Z6841 Body Mass Index (BMI) 40.0 and over, adult: Secondary | ICD-10-CM | POA: Diagnosis not present

## 2022-08-18 DIAGNOSIS — G4719 Other hypersomnia: Secondary | ICD-10-CM | POA: Diagnosis not present

## 2022-09-01 DIAGNOSIS — F4310 Post-traumatic stress disorder, unspecified: Secondary | ICD-10-CM | POA: Diagnosis not present

## 2022-09-07 DIAGNOSIS — R5383 Other fatigue: Secondary | ICD-10-CM | POA: Diagnosis not present

## 2022-09-07 DIAGNOSIS — Z6841 Body Mass Index (BMI) 40.0 and over, adult: Secondary | ICD-10-CM | POA: Diagnosis not present

## 2022-09-07 DIAGNOSIS — J069 Acute upper respiratory infection, unspecified: Secondary | ICD-10-CM | POA: Diagnosis not present

## 2022-09-14 DIAGNOSIS — G4733 Obstructive sleep apnea (adult) (pediatric): Secondary | ICD-10-CM | POA: Diagnosis not present

## 2022-09-14 DIAGNOSIS — I10 Essential (primary) hypertension: Secondary | ICD-10-CM | POA: Diagnosis not present

## 2022-09-15 DIAGNOSIS — F4310 Post-traumatic stress disorder, unspecified: Secondary | ICD-10-CM | POA: Diagnosis not present

## 2022-09-21 ENCOUNTER — Ambulatory Visit
Admission: RE | Admit: 2022-09-21 | Discharge: 2022-09-21 | Disposition: A | Discharge status: Home / Self Care | Payer: PPO | Source: Ambulatory Visit | Attending: Family Medicine | Admitting: Family Medicine

## 2022-09-21 DIAGNOSIS — Z1231 Encounter for screening mammogram for malignant neoplasm of breast: Secondary | ICD-10-CM | POA: Diagnosis not present

## 2022-09-24 ENCOUNTER — Other Ambulatory Visit: Payer: Self-pay | Admitting: Family Medicine

## 2022-09-24 DIAGNOSIS — R928 Other abnormal and inconclusive findings on diagnostic imaging of breast: Secondary | ICD-10-CM

## 2022-09-29 DIAGNOSIS — F4310 Post-traumatic stress disorder, unspecified: Secondary | ICD-10-CM | POA: Diagnosis not present

## 2022-09-30 DIAGNOSIS — G4733 Obstructive sleep apnea (adult) (pediatric): Secondary | ICD-10-CM | POA: Diagnosis not present

## 2022-09-30 DIAGNOSIS — R400 Somnolence: Secondary | ICD-10-CM | POA: Diagnosis not present

## 2022-10-05 ENCOUNTER — Ambulatory Visit
Admission: RE | Admit: 2022-10-05 | Discharge: 2022-10-05 | Disposition: A | Discharge status: Home / Self Care | Payer: PPO | Source: Ambulatory Visit | Attending: Family Medicine | Admitting: Family Medicine

## 2022-10-05 ENCOUNTER — Other Ambulatory Visit: Payer: Self-pay | Admitting: Family Medicine

## 2022-10-05 DIAGNOSIS — R928 Other abnormal and inconclusive findings on diagnostic imaging of breast: Secondary | ICD-10-CM

## 2022-10-05 DIAGNOSIS — N6310 Unspecified lump in the right breast, unspecified quadrant: Secondary | ICD-10-CM

## 2022-10-05 DIAGNOSIS — N6314 Unspecified lump in the right breast, lower inner quadrant: Secondary | ICD-10-CM | POA: Diagnosis not present

## 2022-10-05 DIAGNOSIS — E039 Hypothyroidism, unspecified: Secondary | ICD-10-CM | POA: Diagnosis not present

## 2022-10-07 ENCOUNTER — Ambulatory Visit
Admission: RE | Admit: 2022-10-07 | Discharge: 2022-10-07 | Disposition: A | Discharge status: Home / Self Care | Payer: PPO | Source: Ambulatory Visit | Attending: Family Medicine | Admitting: Family Medicine

## 2022-10-07 DIAGNOSIS — N6310 Unspecified lump in the right breast, unspecified quadrant: Secondary | ICD-10-CM

## 2022-10-07 DIAGNOSIS — N6314 Unspecified lump in the right breast, lower inner quadrant: Secondary | ICD-10-CM | POA: Diagnosis not present

## 2022-10-07 HISTORY — PX: BREAST BIOPSY: SHX20

## 2022-10-11 ENCOUNTER — Telehealth: Payer: Self-pay | Admitting: Hematology and Oncology

## 2022-10-11 NOTE — Telephone Encounter (Signed)
Left message with upcoming appointment time and my phone number

## 2022-10-13 DIAGNOSIS — F4310 Post-traumatic stress disorder, unspecified: Secondary | ICD-10-CM | POA: Diagnosis not present

## 2022-10-18 ENCOUNTER — Encounter: Payer: Self-pay | Admitting: *Deleted

## 2022-10-18 DIAGNOSIS — C50311 Malignant neoplasm of lower-inner quadrant of right female breast: Secondary | ICD-10-CM | POA: Insufficient documentation

## 2022-10-18 DIAGNOSIS — Z170 Estrogen receptor positive status [ER+]: Secondary | ICD-10-CM | POA: Insufficient documentation

## 2022-10-18 NOTE — Progress Notes (Signed)
Radiation Oncology         [phone] ________________________________  Name: [name] [name]        MRN: [id number]  Date of Service: 10/20/2022 DOB: October 02, 1950  [id number], Mancel Bale, PA  Rolm Bookbinder, MD     REFERRING PHYSICIAN: Rolm Bookbinder, MD   DIAGNOSIS: The encounter diagnosis was Malignant neoplasm of lower-inner quadrant of right breast of female, estrogen receptor positive ([place]).   HISTORY OF PRESENT ILLNESS: [name] [name] is a 72 y.o. female seen in the multidisciplinary breast clinic for a new diagnosis of right breast cancer. The patient was noted to have a screening detected mass in the right breast.  Further diagnostic imaging identified this in the 4 o'clock position.  By ultrasound this measured 8 mm in greatest dimension in the 4 o'clock position.  Mild internal blood flow was identified.  Her right axillary lymph nodes were normal in appearance.  She underwent a biopsy on 10/07/2022 that showed grade 2 invasive ductal carcinoma that was ER/PR positive, HER2/neu amplified with a Ki-67 of 5%.  She is seen today to discuss treatment recommendations of her cancer.    PREVIOUS RADIATION THERAPY: {EXAM; YES/NO:19492::"No"}   PAST MEDICAL HISTORY:  Past Medical History:  Diagnosis Date   ADHD (attention deficit hyperactivity disorder)    Anemia    Anxiety    Arthritis    Depression    GERD (gastroesophageal reflux disease)    History of panic attacks    Hyperlipidemia    Hypertension    Hypothyroidism, postsurgical    Pre-diabetes    SBO (small bowel obstruction) ([place]) 01/15/2020   SUI (stress urinary incontinence, female)    Wears glasses        PAST SURGICAL HISTORY: Past Surgical History:  Procedure Laterality Date   BREAST BIOPSY Right 10/07/2022   BREAST BIOPSY Right 10/07/2022   [place] RT BREAST BX W LOC DEV 1ST LESION IMG BX SPEC US GUIDE 10/07/2022 [facility]   D & C HYSTEROSCOPY W/ RESECTION ENDOMETRIAL POLYP  04/15/2009   D & C  HYSTEROSCOPY W/ RESECTOSCOPE AND THERMACHOICE ENDOMETRIAL ABLATION  12/09/2010   LAPAROSCOPIC ASSISTED VENTRAL HERNIA REPAIR Left 01/16/2020   Procedure: LAPAROSCOPIC ASSISTED HERNIA REPAIR;  Surgeon: Clovis Riley, MD;  Location: [facility];  Service: General;  Laterality: Left;   PUBOVAGINAL SLING N/A 07/26/2016   Procedure: PUBO-VAGINAL SLING ALTIS SLING;  Surgeon: Carolan Clines, MD;  Location: [facility];  Service: Urology;  Laterality: N/A;   REPAIR SPIGELIAN HERNIA     RESECTION GIANT CELL TUMOR RIGHT LONG FINGER  09/13/2008   REVERSE SHOULDER ARTHROPLASTY Right 07/16/2021   Procedure: REVERSE SHOULDER ARTHROPLASTY;  Surgeon: Tania Ade, MD;  Location: [facility];  Service: Orthopedics;  Laterality: Right;   ROTATOR CUFF REPAIR Bilateral    THYROIDECTOMY  1979    [facility]   non-malignant diseased thyroid   THYROIDECTOMY Left 03/17/2021   Procedure: COMPLETION THYROIDECTOMY, LEFT LOBE;  Surgeon: Armandina Gemma, MD;  Location: [facility];  Service: General;  Laterality: Left;   TOTAL KNEE ARTHROPLASTY Right 12/17/2019   Procedure: RIGHT TOTAL KNEE ARTHROPLASTY;  Surgeon: Frederik Pear, MD;  Location: [facility];  Service: Orthopedics;  Laterality: Right;   TOTAL KNEE ARTHROPLASTY Left 03/03/2020   Procedure: LEFT TOTAL KNEE ARTHROPLASTY;  Surgeon: Frederik Pear, MD;  Location: [facility];  Service: Orthopedics;  Laterality: Left;   TRIPLE ARTHRODESIS LEFT FOOT  04/20/2007   VENTRAL HERNIA REPAIR  2003   WRIST GANGLION EXCISION Right  1975     FAMILY HISTORY:  Family History  Problem Relation Age of Onset   Breast cancer Mother        11s   Uterine cancer Mother    Breast cancer Sister 29   Diabetes Maternal Grandfather    Heart disease Paternal Grandfather      SOCIAL HISTORY:  reports that she quit smoking about 48 years ago. Her smoking use included cigarettes. She has never used smokeless tobacco. She reports that she does not currently use alcohol. She reports that she  does not use drugs.  The patient is married and lives in [place].  She***   ALLERGIES: Ceclor [cefaclor], Bactrim [sulfamethoxazole-trimethoprim], and Gabapentin   MEDICATIONS:  Current Outpatient Medications  Medication Sig Dispense Refill   ALPRAZolam (XANAX) 0.5 MG tablet Take 0.5 mg by mouth daily as needed for anxiety.     amoxicillin (AMOXIL) 500 MG capsule Take 2,000 mg by mouth See admin instructions. Take 4 capsule (2000 mg) by mouth 1 hour prior to dental procedure.     buPROPion (WELLBUTRIN XL) 150 MG 24 hr tablet Take 150 mg by mouth in the morning.     CALCIUM PO Take 250 mg by mouth daily. Calcium Citrate     Cholecalciferol (VITAMIN D3) 125 MCG (5000 UT) TABS Take 5,000 Units by mouth daily.     DULoxetine (CYMBALTA) 60 MG capsule Take 120 mg by mouth in the morning.  2   irbesartan (AVAPRO) 150 MG tablet Take 75 mg by mouth in the morning.     levothyroxine (SYNTHROID, LEVOTHROID) 125 MCG tablet Take 125 mcg by mouth daily before breakfast.   0   lisdexamfetamine (VYVANSE) 50 MG capsule Take 50 mg by mouth in the morning.     MYRBETRIQ 50 MG TB24 tablet Take 50 mg by mouth in the morning.     omeprazole (PRILOSEC) 20 MG capsule Take 20 mg by mouth daily before breakfast.      simvastatin (ZOCOR) 40 MG tablet Take 40 mg by mouth in the morning.  2   No current facility-administered medications for this visit.     REVIEW OF SYSTEMS: On review of systems, the patient reports that she is doing ***     PHYSICAL EXAM:  Wt Readings from Last 3 Encounters:  07/16/21 274 lb 3.2 oz (124.4 kg)  07/03/21 274 lb 3.2 oz (124.4 kg)  03/17/21 262 lb 2 oz (118.9 kg)   Temp Readings from Last 3 Encounters:  07/16/21 (!) 97.4 F (36.3 C)  07/03/21 98.2 F (36.8 C) (Oral)  03/18/21 98 F (36.7 C) (Oral)   BP Readings from Last 3 Encounters:  07/16/21 (!) 114/48  07/03/21 (!) 156/74  03/18/21 (!) 143/120   Pulse Readings from Last 3 Encounters:  07/16/21 70  07/03/21  77  03/18/21 67    In general this is a well appearing caucasian female in no acute distress. She's alert and oriented x4 and appropriate throughout the examination. Cardiopulmonary assessment is negative for acute distress and she exhibits normal effort. Bilateral breast exam is deferred.    ECOG = ***  0 - Asymptomatic (Fully active, able to carry on all predisease activities without restriction)  1 - Symptomatic but completely ambulatory (Restricted in physically strenuous activity but ambulatory and able to carry out work of a light or sedentary nature. For example, light housework, office work)  2 - Symptomatic, <50% in bed during the day (Ambulatory and capable of all self care but  unable to carry out any work activities. Up and about more than 50% of waking hours)  3 - Symptomatic, >50% in bed, but not bedbound (Capable of only limited self-care, confined to bed or chair 50% or more of waking hours)  4 - Bedbound (Completely disabled. Cannot carry on any self-care. Totally confined to bed or chair)  5 - Death   Eustace Pen MM, Creech RH, Tormey DC, et al. [phone]). "Toxicity and response criteria of the [facility]". [place] Oncol. 5 (6): 649-55    LABORATORY DATA:  Lab Results  Component Value Date   WBC 6.2 03/09/2021   HGB 12.4 03/09/2021   HCT 40.7 03/09/2021   MCV 89.1 03/09/2021   PLT 366 03/09/2021   Lab Results  Component Value Date   NA 140 03/18/2021   K 4.1 03/18/2021   CL 106 03/18/2021   CO2 28 03/18/2021   Lab Results  Component Value Date   ALT 18 01/15/2020   AST 19 01/15/2020   ALKPHOS 111 01/15/2020   BILITOT 1.1 01/15/2020      RADIOGRAPHY: [place] RT BREAST BX W LOC DEV 1ST LESION IMG BX SPEC US GUIDE  Addendum Date: 10/11/2022   ADDENDUM REPORT: 10/11/2022 16:05 ADDENDUM: Pathology revealed GRADE 2/3 INVASIVE DUCTAL CARCINOMA of the RIGHT breast, 4 o'clock, (heart clip). This was found to be concordant by Dr. Lovey Newcomer.  Pathology results were discussed with the patient and her spouse by telephone. The patient reported doing well after the biopsy with tenderness at the site. Post biopsy instructions and care were reviewed and questions were answered. The patient was encouraged to call [facility] for any additional concerns. Per patient request, The patient was referred to [facility] on October 20, 2022- PM clinic. Pathology results reported by Stacie Acres RN on 10/11/2022. Electronically Signed   By: Lovey Newcomer M.D.   On: 10/11/2022 16:05   Result Date: 10/11/2022 CLINICAL DATA:  Patient with indeterminate right breast mass 4 o'clock position EXAM: ULTRASOUND GUIDED RIGHT BREAST CORE NEEDLE BIOPSY COMPARISON:  Previous exam(s). PROCEDURE: I met with the patient and we discussed the procedure of ultrasound-guided biopsy, including benefits and alternatives. We discussed the high likelihood of a successful procedure. We discussed the risks of the procedure, including infection, bleeding, tissue injury, clip migration, and inadequate sampling. Informed written consent was given. The usual time-out protocol was performed immediately prior to the procedure. Lesion quadrant: Lower inner quadrant Using sterile technique and 1% Lidocaine as local anesthetic, under direct ultrasound visualization, a 14 gauge spring-loaded device was used to perform biopsy of right breast mass 4 o'clock position using a lateral approach. At the conclusion of the procedure heart shaped tissue marker clip was deployed into the biopsy cavity. Follow up 2 view mammogram was performed and dictated separately. IMPRESSION: Ultrasound guided biopsy of right breast mass 4 o'clock position. No apparent complications. Electronically Signed: By: Lovey Newcomer M.D. On: 10/07/2022 13:45  MM CLIP PLACEMENT RIGHT  Result Date: 10/07/2022 CLINICAL DATA:  Status post  ultrasound biopsy right breast mass EXAM: 3D DIAGNOSTIC RIGHT MAMMOGRAM POST ULTRASOUND BIOPSY COMPARISON:  Previous exam(s). FINDINGS: 3D Mammographic images were obtained following ultrasound guided biopsy of right breast mass. The biopsy marking clip is in expected position at the site of biopsy. IMPRESSION: Appropriate positioning of the heart shaped biopsy marking clip at the site of biopsy in the lower inner right  breast. Final Assessment: Post Procedure Mammograms for Marker Placement Electronically Signed   By: Lovey Newcomer M.D.   On: 10/07/2022 14:00  MM DIAG BREAST TOMO UNI RIGHT  Result Date: 10/05/2022 CLINICAL DATA:  The patient was called back for a small spiculated mass in the inferior medial right breast. EXAM: DIGITAL DIAGNOSTIC UNILATERAL RIGHT MAMMOGRAM WITH TOMOSYNTHESIS; ULTRASOUND RIGHT BREAST LIMITED TECHNIQUE: Right digital diagnostic mammography and breast tomosynthesis was performed.; Targeted ultrasound examination of the right breast was performed COMPARISON:  Previous exam(s). ACR Breast Density Category b: There are scattered areas of fibroglandular density. FINDINGS: A small spiculated mass is identified at approximately 4 o'clock in the right breast. Targeted ultrasound is performed, showing an irregular mass at 4 o'clock, 7 cm from the nipple measuring 8 x 5 x 5 mm. The mass is irregular with no posterior shadowing or increased through transmission. Mild internal blood flow identified. Axillary lymph nodes are normal in appearance. IMPRESSION: New spiculated right breast mass at 4 o'clock. RECOMMENDATION: Recommend ultrasound-guided biopsy of the new spiculated right breast mass. I have discussed the findings and recommendations with the patient. If applicable, a reminder letter will be sent to the patient regarding the next appointment. BI-RADS CATEGORY  5: Highly suggestive of malignancy. Electronically Signed   By: Dorise Bullion III M.D.   On: 10/05/2022 15:37  US BREAST  LTD UNI RIGHT INC AXILLA  Result Date: 10/05/2022 CLINICAL DATA:  The patient was called back for a small spiculated mass in the inferior medial right breast. EXAM: DIGITAL DIAGNOSTIC UNILATERAL RIGHT MAMMOGRAM WITH TOMOSYNTHESIS; ULTRASOUND RIGHT BREAST LIMITED TECHNIQUE: Right digital diagnostic mammography and breast tomosynthesis was performed.; Targeted ultrasound examination of the right breast was performed COMPARISON:  Previous exam(s). ACR Breast Density Category b: There are scattered areas of fibroglandular density. FINDINGS: A small spiculated mass is identified at approximately 4 o'clock in the right breast. Targeted ultrasound is performed, showing an irregular mass at 4 o'clock, 7 cm from the nipple measuring 8 x 5 x 5 mm. The mass is irregular with no posterior shadowing or increased through transmission. Mild internal blood flow identified. Axillary lymph nodes are normal in appearance. IMPRESSION: New spiculated right breast mass at 4 o'clock. RECOMMENDATION: Recommend ultrasound-guided biopsy of the new spiculated right breast mass. I have discussed the findings and recommendations with the patient. If applicable, a reminder letter will be sent to the patient regarding the next appointment. BI-RADS CATEGORY  5: Highly suggestive of malignancy. Electronically Signed   By: Dorise Bullion III M.D.   On: 10/05/2022 15:37  MM 3D SCREEN BREAST BILATERAL  Result Date: 09/23/2022 CLINICAL DATA:  Screening. EXAM: DIGITAL SCREENING BILATERAL MAMMOGRAM WITH TOMOSYNTHESIS AND CAD TECHNIQUE: Bilateral screening digital craniocaudal and mediolateral oblique mammograms were obtained. Bilateral screening digital breast tomosynthesis was performed. The images were evaluated with computer-aided detection. COMPARISON:  Previous exam(s). ACR Breast Density Category b: There are scattered areas of fibroglandular density. FINDINGS: In the right breast, a possible mass warrants further evaluation. In the left  breast, no findings suspicious for malignancy. IMPRESSION: Further evaluation is suggested for a possible mass in the right breast. RECOMMENDATION: Diagnostic mammogram and possibly ultrasound of the right breast. (Code:[id number]) The patient will be contacted regarding the findings, and additional imaging will be scheduled. BI-RADS CATEGORY  0: Incomplete. Need additional imaging evaluation and/or prior mammograms for comparison. Electronically Signed   By: Nolon Nations M.D.   On: 09/23/2022 16:29       IMPRESSION/PLAN: 1. Stage IA,  [id number], grade 2 triple positive invasive ductal carcinoma of the right breast. Dr. Lisbeth Renshaw discusses the pathology findings and reviews the nature of early stage right breast disease. The consensus from the breast conference includes breast conservation with lumpectomy with *** sentinel node biopsy. Dr. Lindi Adie anticipates ***. Dr. Lisbeth Renshaw recommends external radiotherapy to the breast  to reduce risks of local recurrence followed by antiestrogen therapy. We discussed the risks, benefits, short, and long term effects of radiotherapy, as well as the curative intent, and the patient is interested in proceeding. Dr. Lisbeth Renshaw discusses the delivery and logistics of radiotherapy and anticipates a course of 4 or up to 6 1/2 weeks of radiotherapy to the right breast. We will see her back a few weeks after surgery to discuss the simulation process and anticipate we starting radiotherapy about 4-6 weeks after surgery.   2. Possible genetic predisposition to malignancy. The patient is a candidate for genetic testing given her personal and family history. She will meet with our geneticist today in clinic.   In a visit lasting *** minutes, greater than 50% of the time was spent face to face reviewing her case, as well as in preparation of, discussing, and coordinating the patient's care.  The above documentation reflects my direct findings during this shared patient visit. Please see the  separate note by Dr. Lisbeth Renshaw on this date for the remainder of the patient's plan of care.    Carola Rhine, [facility]    **Disclaimer: This note was dictated with voice recognition software. Similar sounding words can inadvertently be transcribed and this note may contain transcription errors which may not have been corrected upon publication of note.**

## 2022-10-20 ENCOUNTER — Other Ambulatory Visit: Payer: Self-pay

## 2022-10-20 ENCOUNTER — Ambulatory Visit: Payer: PPO | Attending: General Surgery | Admitting: Physical Therapy

## 2022-10-20 ENCOUNTER — Inpatient Hospital Stay (HOSPITAL_BASED_OUTPATIENT_CLINIC_OR_DEPARTMENT_OTHER): Payer: PPO | Admitting: Genetic Counselor

## 2022-10-20 ENCOUNTER — Inpatient Hospital Stay (HOSPITAL_BASED_OUTPATIENT_CLINIC_OR_DEPARTMENT_OTHER): Payer: PPO | Admitting: Hematology and Oncology

## 2022-10-20 ENCOUNTER — Other Ambulatory Visit: Payer: Self-pay | Admitting: General Surgery

## 2022-10-20 ENCOUNTER — Encounter: Payer: Self-pay | Admitting: *Deleted

## 2022-10-20 ENCOUNTER — Inpatient Hospital Stay: Payer: PPO | Attending: Nurse Practitioner

## 2022-10-20 ENCOUNTER — Encounter: Payer: Self-pay | Admitting: Physical Therapy

## 2022-10-20 ENCOUNTER — Ambulatory Visit
Admission: RE | Admit: 2022-10-20 | Discharge: 2022-10-20 | Disposition: A | Discharge status: Home / Self Care | Payer: PPO | Source: Ambulatory Visit | Attending: Radiation Oncology | Admitting: Radiation Oncology

## 2022-10-20 ENCOUNTER — Encounter: Payer: Self-pay | Admitting: Genetic Counselor

## 2022-10-20 ENCOUNTER — Encounter: Payer: Self-pay | Admitting: General Practice

## 2022-10-20 VITALS — BP 141/56 | HR 84 | Temp 97.3°F | Resp 18 | Ht 63.0 in | Wt 289.7 lb

## 2022-10-20 DIAGNOSIS — R293 Abnormal posture: Secondary | ICD-10-CM | POA: Insufficient documentation

## 2022-10-20 DIAGNOSIS — I10 Essential (primary) hypertension: Secondary | ICD-10-CM | POA: Insufficient documentation

## 2022-10-20 DIAGNOSIS — C50311 Malignant neoplasm of lower-inner quadrant of right female breast: Secondary | ICD-10-CM | POA: Diagnosis not present

## 2022-10-20 DIAGNOSIS — Z853 Personal history of malignant neoplasm of breast: Secondary | ICD-10-CM | POA: Diagnosis not present

## 2022-10-20 DIAGNOSIS — Z170 Estrogen receptor positive status [ER+]: Secondary | ICD-10-CM | POA: Insufficient documentation

## 2022-10-20 DIAGNOSIS — Z87891 Personal history of nicotine dependence: Secondary | ICD-10-CM | POA: Diagnosis not present

## 2022-10-20 DIAGNOSIS — E890 Postprocedural hypothyroidism: Secondary | ICD-10-CM

## 2022-10-20 DIAGNOSIS — Z803 Family history of malignant neoplasm of breast: Secondary | ICD-10-CM | POA: Diagnosis not present

## 2022-10-20 DIAGNOSIS — M25611 Stiffness of right shoulder, not elsewhere classified: Secondary | ICD-10-CM | POA: Insufficient documentation

## 2022-10-20 LAB — CBC WITH DIFFERENTIAL (CANCER CENTER ONLY)
Abs Immature Granulocytes: 0.03 10*3/uL (ref 0.00–0.07)
Basophils Absolute: 0.1 10*3/uL (ref 0.0–0.1)
Basophils Relative: 1 %
Eosinophils Absolute: 0.3 10*3/uL (ref 0.0–0.5)
Eosinophils Relative: 4 %
HCT: 39.6 % (ref 36.0–46.0)
Hemoglobin: 12.5 g/dL (ref 12.0–15.0)
Immature Granulocytes: 0 %
Lymphocytes Relative: 24 %
Lymphs Abs: 1.8 10*3/uL (ref 0.7–4.0)
MCH: 27.4 pg (ref 26.0–34.0)
MCHC: 31.6 g/dL (ref 30.0–36.0)
MCV: 86.7 fL (ref 80.0–100.0)
Monocytes Absolute: 0.8 10*3/uL (ref 0.1–1.0)
Monocytes Relative: 11 %
Neutro Abs: 4.5 10*3/uL (ref 1.7–7.7)
Neutrophils Relative %: 60 %
Platelet Count: 350 10*3/uL (ref 150–400)
RBC: 4.57 MIL/uL (ref 3.87–5.11)
RDW: 15.8 % — ABNORMAL HIGH (ref 11.5–15.5)
WBC Count: 7.6 10*3/uL (ref 4.0–10.5)
nRBC: 0 % (ref 0.0–0.2)

## 2022-10-20 LAB — GENETIC SCREENING ORDER

## 2022-10-20 LAB — CMP (CANCER CENTER ONLY)
ALT: 14 U/L (ref 0–44)
AST: 15 U/L (ref 15–41)
Albumin: 4 g/dL (ref 3.5–5.0)
Alkaline Phosphatase: 90 U/L (ref 38–126)
Anion gap: 7 (ref 5–15)
BUN: 17 mg/dL (ref 8–23)
CO2: 28 mmol/L (ref 22–32)
Calcium: 9 mg/dL (ref 8.9–10.3)
Chloride: 106 mmol/L (ref 98–111)
Creatinine: 0.94 mg/dL (ref 0.44–1.00)
GFR, Estimated: >60 mL/min (ref >60)
Glucose, Bld: 92 mg/dL (ref 70–99)
Potassium: 4 mmol/L (ref 3.5–5.1)
Sodium: 141 mmol/L (ref 135–145)
Total Bilirubin: 0.4 mg/dL (ref 0.3–1.2)
Total Protein: 7.1 g/dL (ref 6.5–8.1)

## 2022-10-20 NOTE — Progress Notes (Signed)
[place] NOTE  Patient Care Team: Lois Huxley, PA as PCP - General (Family Medicine) Rolm Bookbinder, MD as Consulting Physician (General Surgery) Nicholas Lose, MD as Consulting Physician (Hematology and Oncology) Kyung Rudd, MD as Consulting Physician (Radiation Oncology) Rockwell Germany, RN as Oncology Nurse Navigator Mauro Kaufmann, RN as Oncology Nurse Navigator  CHIEF COMPLAINTS/PURPOSE OF CONSULTATION:  Newly diagnosed breast cancer  HISTORY OF PRESENTING ILLNESS:  [name] [name] 72 y.o. female is here because of recent diagnosis of right breast cancer.  Patient had routine screening mammogram that detected right breast mass measuring 0.8 cm and axilla was negative.  Biopsy of it revealed grade 2 invasive ductal carcinoma that was ER/PR positive HER2 positive with a Ki-67 of 5%.  She was presented this morning to the multidisciplinary tumor board and she is here today to discuss her treatment plan.  I reviewed her records extensively and collaborated the history with the patient.  SUMMARY OF ONCOLOGIC HISTORY: Oncology History  Malignant neoplasm of lower-inner quadrant of right breast of female, estrogen receptor positive ([place])  10/07/2022 Initial Diagnosis   Screening mammogram detected right breast mass LIQ 0.8 cm, axilla negative, biopsy revealed grade 2 IDC ER 100%, PR 100%, Ki67 5%, HER2 3+ positive   10/18/2022 Cancer Staging   Staging form: Breast, AJCC 8th Edition - Clinical stage from 10/18/2022: Stage IA (cT1b, cN0, cM0, G2, ER+, PR+, HER2+) - Signed by Hayden Pedro, PA-C on 10/18/2022 Stage prefix: Initial diagnosis Method of lymph node assessment: Clinical Histologic grading system: 3 grade system      MEDICAL HISTORY:  Past Medical History:  Diagnosis Date   ADHD (attention deficit hyperactivity disorder)    Anemia    Anxiety    Arthritis    Depression    GERD (gastroesophageal reflux disease)    History of panic  attacks    Hyperlipidemia    Hypertension    Hypothyroidism, postsurgical    Pre-diabetes    SBO (small bowel obstruction) ([place]) 01/15/2020   SUI (stress urinary incontinence, female)    Wears glasses     SURGICAL HISTORY: Past Surgical History:  Procedure Laterality Date   BREAST BIOPSY Right 10/07/2022   BREAST BIOPSY Right 10/07/2022   [place] RT BREAST BX W LOC DEV 1ST LESION IMG BX SPEC US GUIDE 10/07/2022 [facility]   D & C HYSTEROSCOPY W/ RESECTION ENDOMETRIAL POLYP  04/15/2009   D & C HYSTEROSCOPY W/ RESECTOSCOPE AND THERMACHOICE ENDOMETRIAL ABLATION  12/09/2010   LAPAROSCOPIC ASSISTED VENTRAL HERNIA REPAIR Left 01/16/2020   Procedure: LAPAROSCOPIC ASSISTED HERNIA REPAIR;  Surgeon: Clovis Riley, MD;  Location: [facility];  Service: General;  Laterality: Left;   PUBOVAGINAL SLING N/A 07/26/2016   Procedure: PUBO-VAGINAL SLING ALTIS SLING;  Surgeon: Carolan Clines, MD;  Location: [facility];  Service: Urology;  Laterality: N/A;   REPAIR SPIGELIAN HERNIA     RESECTION GIANT CELL TUMOR RIGHT LONG FINGER  09/13/2008   REVERSE SHOULDER ARTHROPLASTY Right 07/16/2021   Procedure: REVERSE SHOULDER ARTHROPLASTY;  Surgeon: Tania Ade, MD;  [facility];  Service: Orthopedics;  Laterality: Right;   ROTATOR CUFF REPAIR Bilateral    THYROIDECTOMY  1979    [facility]   non-malignant diseased thyroid   THYROIDECTOMY Left 03/17/2021   Procedure: COMPLETION THYROIDECTOMY, LEFT LOBE;  Surgeon: Armandina Gemma, MD;  [facility];  Service: General;  Laterality: Left;   TOTAL KNEE ARTHROPLASTY Right 12/17/2019   Procedure: RIGHT TOTAL KNEE  ARTHROPLASTY;  Surgeon: Frederik Pear, MD;  [facility];  Service: Orthopedics;  Laterality: Right;   TOTAL KNEE ARTHROPLASTY Left 03/03/2020   Procedure: LEFT TOTAL KNEE ARTHROPLASTY;  Surgeon: Frederik Pear, MD;  [facility];  Service: Orthopedics;  Laterality: Left;   TRIPLE ARTHRODESIS LEFT FOOT  04/20/2007   VENTRAL  HERNIA REPAIR  2003   WRIST GANGLION EXCISION Right 1975    SOCIAL HISTORY: Social History   Socioeconomic History   Marital status: Married    Spouse name: Not on file   Number of children: Not on file   Years of education: Not on file   Highest education level: Not on file  Occupational History   Not on file  Tobacco Use   Smoking status: Former    Years: 6.00    Types: Cigarettes    Quit date: 09/21/1974    Years since quitting: 48.1   Smokeless tobacco: Never  Vaping Use   Vaping Use: Never used  Substance and Sexual Activity   Alcohol use: Not Currently    Comment: very rare   Drug use: No   Sexual activity: Yes    Birth control/protection: Post-menopausal  Other Topics Concern   Not on file  Social History Narrative   Not on file   Social Determinants of Health   Financial Resource Strain: Not on file  Food Insecurity: Not on file  Transportation Needs: Not on file  Physical Activity: Not on file  Stress: Not on file  Social Connections: Not on file  Intimate Partner Violence: Not on file    FAMILY HISTORY: Family History  Problem Relation Age of Onset   Breast cancer Mother 27   Uterine cancer Mother    Breast cancer Sister 23   Diabetes Maternal Grandfather    Heart disease Paternal Grandfather     ALLERGIES:  is allergic to ceclor [cefaclor], bactrim [sulfamethoxazole-trimethoprim], and gabapentin.  MEDICATIONS:  Current Outpatient Medications  Medication Sig Dispense Refill   ALPRAZolam (XANAX) 0.5 MG tablet Take 0.5 mg by mouth daily as needed for anxiety.     buPROPion (WELLBUTRIN XL) 150 MG 24 hr tablet Take 150 mg by mouth daily.     CALCIUM PO Take 250 mg by mouth daily. Calcium Citrate     Cholecalciferol (VITAMIN D3) 125 MCG (5000 UT) TABS Take 5,000 Units by mouth daily.     DULoxetine (CYMBALTA) 60 MG capsule Take 120 mg by mouth in the morning.  2   ferrous sulfate 325 (65 FE) MG tablet Take 325 mg by mouth.     irbesartan (AVAPRO)  150 MG tablet Take 75 mg by mouth in the morning.     lamoTRIgine (LAMICTAL) 25 MG tablet Take 25 mg by mouth daily.     levothyroxine (SYNTHROID) 137 MCG tablet Take 137 mcg by mouth daily before breakfast.     lisdexamfetamine (VYVANSE) 50 MG capsule Take 50 mg by mouth in the morning.     Multiple Vitamin (MULTIVITAMIN) capsule Take 1 capsule by mouth daily.     MYRBETRIQ 50 MG TB24 tablet Take 50 mg by mouth in the morning.     omeprazole (PRILOSEC) 20 MG capsule Take 20 mg by mouth daily before breakfast.      simvastatin (ZOCOR) 40 MG tablet Take 40 mg by mouth in the morning.  2   vitamin k 100 MCG tablet Take 100 mcg by mouth daily.     No current facility-administered medications for this visit.  REVIEW OF SYSTEMS:   Constitutional: Denies fevers, chills or abnormal night sweats Breast:  Denies any palpable lumps or discharge All other systems were reviewed with the patient and are negative.  PHYSICAL EXAMINATION: ECOG PERFORMANCE STATUS: 1 - Symptomatic but completely ambulatory  Vitals:   10/20/22 1240  BP: (!) 141/56  Pulse: 84  Resp: 18  Temp: (!) 97.3 F (36.3 C)  SpO2: 99%   Filed Weights   10/20/22 1240  Weight: 289 lb 11.2 oz (131.4 kg)    GENERAL:alert, no distress and comfortable     LABORATORY DATA:  I have reviewed the data as listed Lab Results  Component Value Date   WBC 7.6 10/20/2022   HGB 12.5 10/20/2022   HCT 39.6 10/20/2022   MCV 86.7 10/20/2022   PLT 350 10/20/2022   Lab Results  Component Value Date   NA 141 10/20/2022   K 4.0 10/20/2022   CL 106 10/20/2022   CO2 28 10/20/2022    RADIOGRAPHIC STUDIES: I have personally reviewed the radiological reports and agreed with the findings in the report.  ASSESSMENT AND PLAN:  Malignant neoplasm of lower-inner quadrant of right breast of female, estrogen receptor positive ([place]) 10/07/2022:Screening mammogram detected right breast mass LIQ 0.8 cm, axilla negative, biopsy revealed grade  2 IDC ER 100%, PR 100%, Ki67 5%, HER2 3+ positive  Pathology and radiology counseling: Discussed with the patient, the details of pathology including the type of breast cancer,the clinical staging, the significance of ER, PR and HER-2/neu receptors and the implications for treatment. After reviewing the pathology in detail, we proceeded to discuss the different treatment options between surgery, radiation, chemotherapy, antiestrogen therapies.  Treatment plan: Breast conserving surgery Repeat breast prognostic panel on the biopsy to determine if she would benefit from adjuvant chemo with Taxol Herceptin (if she is truly HER2 positive) Adjuvant radiation Adjuvant antiestrogen therapy Genetic counseling  I discussed the Taxol Herceptin chemotherapy followed by Herceptin maintenance regimen in great detail.  Return to clinic after surgery to discuss the pathology report.  All questions were answered. The patient knows to call the clinic with any problems, questions or concerns.    Harriette Ohara, MD 10/20/22

## 2022-10-20 NOTE — Progress Notes (Signed)
[place] Psychosocial Distress Screening Spiritual Care  Met with Zasha in [facility] to introduce [facility] team/resources, reviewing distress screen per protocol.  The patient scored a 6 on the Psychosocial Distress Thermometer which indicates moderate distress. Also assessed for distress and other psychosocial needs.      10/20/2022    5:10 PM  ONCBCN DISTRESS SCREENING  Screening Type Initial Screening  Distress experienced in past week (1-10) 6  Emotional problem type Depression;Nervousness/Anxiety  Physical Problem type Sleep/insomnia;Tingling hands/feet;Swollen arms/legs  Referral to support programs Yes    Chaplain and patient discussed common feelings and emotions when being diagnosed with cancer, and the importance of support during treatment.  Chaplain informed patient of the support team and support services at [facility].  Chaplain provided contact information and encouraged patient to call with any questions or concerns.  Ms Paduano reports depression since her son died in 79, with additional anxiety and depression related to her daughter's autoimmune disease and double lung transplant, including support from a psychologist and psychiatrist. She reports interest in exploring a new therapist, ideally one who practices EMDR.  Follow up needed: Yes.  Plan to research counselors who take her insurance and to phone her with suggestions.   [place], [place], [facility] Pager [phone] Voicemail [phone]

## 2022-10-20 NOTE — Progress Notes (Unsigned)
REFERRING PROVIDER: Nicholas Lose, MD  PRIMARY PROVIDER:  Lois Huxley, PA  PRIMARY REASON FOR VISIT:  Encounter Diagnoses  Name Primary?   Malignant neoplasm of lower-inner quadrant of right breast of female, estrogen receptor positive ([place]) Yes   Family history of breast cancer    HISTORY OF PRESENT ILLNESS:   Ms. Seiss, a 72 y.o. female, was seen for a [place] cancer genetics consultation during the breast multidisciplinary clinic at the request of Dr. Lindi Adie due to a personal and family history of cancer.  Ms. Kraft presents to clinic today to discuss the possibility of a hereditary predisposition to cancer, to discuss genetic testing, and to further clarify her future cancer risks, as well as potential cancer risks for family members.   In February 2024, at the age of 53, Ms. Kinsler was diagnosed with invasive ductal carcinoma of the right breast (ER/PR/HER2 positive).   CANCER HISTORY:  Oncology History  Malignant neoplasm of lower-inner quadrant of right breast of female, estrogen receptor positive ([place])  10/18/2022 Initial Diagnosis   Malignant neoplasm of lower-inner quadrant of right breast of female, estrogen receptor positive ([place])   10/18/2022 Cancer Staging   Staging form: Breast, AJCC 8th Edition - Clinical stage from 10/18/2022: Stage IA (cT1b, cN0, cM0, G2, ER+, PR+, HER2+) - Signed by Hayden Pedro, PA-C on 10/18/2022 Stage prefix: Initial diagnosis Method of lymph node assessment: Clinical Histologic grading system: 3 grade system      RISK FACTORS:  Menarche was at age 65.  First live birth at age 72.  OCP use for approximately  "several"  years.  Menopausal status: postmenopausal.  HRT use: 0 years. Colonoscopy: yes;  reports her last colonoscopy was in 2023 and "several small polyps" were identified . Mammogram within the last year: yes. Any excessive radiation exposure in the past: no  Past Medical History:  Diagnosis Date   ADHD (attention  deficit hyperactivity disorder)    Anemia    Anxiety    Arthritis    Depression    GERD (gastroesophageal reflux disease)    History of panic attacks    Hyperlipidemia    Hypertension    Hypothyroidism, postsurgical    Pre-diabetes    SBO (small bowel obstruction) ([place]) 01/15/2020   SUI (stress urinary incontinence, female)    Wears glasses     Past Surgical History:  Procedure Laterality Date   BREAST BIOPSY Right 10/07/2022   BREAST BIOPSY Right 10/07/2022   [place] RT BREAST BX W LOC DEV 1ST LESION IMG BX SPEC US GUIDE 10/07/2022 [facility]   D & C HYSTEROSCOPY W/ RESECTION ENDOMETRIAL POLYP  04/15/2009   D & C HYSTEROSCOPY W/ RESECTOSCOPE AND THERMACHOICE ENDOMETRIAL ABLATION  12/09/2010   LAPAROSCOPIC ASSISTED VENTRAL HERNIA REPAIR Left 01/16/2020   Procedure: LAPAROSCOPIC ASSISTED HERNIA REPAIR;  Surgeon: Clovis Riley, MD;  Location: [place];  Service: General;  Laterality: Left;   PUBOVAGINAL SLING N/A 07/26/2016   Procedure: PUBO-VAGINAL SLING ALTIS SLING;  Surgeon: Carolan Clines, MD;  Location: [place];  Service: Urology;  Laterality: N/A;   REPAIR SPIGELIAN HERNIA     RESECTION GIANT CELL TUMOR RIGHT LONG FINGER  09/13/2008   REVERSE SHOULDER ARTHROPLASTY Right 07/16/2021   Procedure: REVERSE SHOULDER ARTHROPLASTY;  Surgeon: Tania Ade, MD;  [facility];  Service: Orthopedics;  Laterality: Right;   ROTATOR CUFF REPAIR Bilateral    THYROIDECTOMY  1979    [facility]   non-malignant diseased thyroid   THYROIDECTOMY  Left 03/17/2021   Procedure: COMPLETION THYROIDECTOMY, LEFT LOBE;  Surgeon: Armandina Gemma, MD;  [facility];  Service: General;  Laterality: Left;   TOTAL KNEE ARTHROPLASTY Right 12/17/2019   Procedure: RIGHT TOTAL KNEE ARTHROPLASTY;  Surgeon: Frederik Pear, MD;  [facility];  Service: Orthopedics;  Laterality: Right;   TOTAL KNEE ARTHROPLASTY Left 03/03/2020   Procedure: LEFT TOTAL KNEE ARTHROPLASTY;  Surgeon: Frederik Pear, MD;  [facility];  Service: Orthopedics;  Laterality: Left;   TRIPLE ARTHRODESIS LEFT FOOT  04/20/2007   VENTRAL HERNIA REPAIR  2003   WRIST GANGLION EXCISION Right 1975    Social History   Socioeconomic History   Marital status: Married    Spouse name: Not on file   Number of children: Not on file   Years of education: Not on file   Highest education level: Not on file  Occupational History   Not on file  Tobacco Use   Smoking status: Former    Years: 6.00    Types: Cigarettes    Quit date: 09/21/1974    Years since quitting: 48.1   Smokeless tobacco: Never  Vaping Use   Vaping Use: Never used  Substance and Sexual Activity   Alcohol use: Not Currently    Comment: very rare   Drug use: No   Sexual activity: Yes    Birth control/protection: Post-menopausal  Other Topics Concern   Not on file  Social History Narrative   Not on file   Social Determinants of Health   Financial Resource Strain: Not on file  Food Insecurity: Not on file  Transportation Needs: Not on file  Physical Activity: Not on file  Stress: Not on file  Social Connections: Not on file     FAMILY HISTORY:  We obtained a detailed, 4-generation family history.  Significant diagnoses are listed below: Family History  Problem Relation Age of Onset   Breast cancer Mother 81   Uterine cancer Mother    Breast cancer Sister 60   Diabetes Maternal Grandfather    Heart disease Paternal Grandfather      Ms. Blizzard's sister was diagnosed with breast cancer at age 61, she had a mastectomy and chemotherapy as her treatment. Her mother was diagnosed with breast cancer at age 32, she had a mastectomy, radiation, and tamoxifen as her treatment. She was later diagnosed with uterine cancer, she died at age 7. Ms. Rittenberry is unaware of previous family history of genetic testing for hereditary cancer risks. There is no reported Ashkenazi Jewish ancestry.   GENETIC COUNSELING ASSESSMENT: Ms. Sharif is a 72  y.o. female with a personal and family history of cancer which is somewhat suggestive of a hereditary cancer syndrome and predisposition to cancer given 3 individuals in the family (including Ms. Banfield) with a history of breast cancer. We, therefore, discussed and recommended the following at today's visit.   DISCUSSION: We discussed that 5 - 10% of cancer is hereditary, with most cases of hereditary breast cancer associated with mutations in BRCA1/2.  There are other genes that can be associated with hereditary breast cancer syndromes. Type of cancer risk and level of risk are gene-specific. We discussed that testing is beneficial for several reasons including knowing how to follow individuals after completing their treatment, identifying whether potential treatment options would be beneficial, and understanding if other family members could be at risk for cancer and allowing them to undergo genetic testing.   We reviewed the characteristics, features and inheritance patterns of  hereditary cancer syndromes. We also discussed genetic testing, including the appropriate family members to test, the process of testing, insurance coverage and turn-around-time for results. We discussed the implications of a negative, positive, carrier and/or variant of uncertain significant result.   Ms. Kinslow elected to have [facility] Common Cancer Panel. The Common Hereditary Cancers Panel offered by [facility] includes sequencing and/or deletion duplication testing of the following 48 genes: APC, ATM, AXIN2, BAP1, BARD1, BMPR1A, BRCA1, BRCA2, BRIP1, CDH1, CDK4, CDKN2A (p14ARF and p16INK4a only), CHEK2, CTNNA1, DICER1, EPCAM (Deletion/duplication testing only), FH, GREM1 (promoter region duplication testing only), HOXB13, KIT, MBD4, MEN1, MLH1, MSH2, MSH3, MSH6, MUTYH, NF1, NHTL1, PALB2, PDGFRA, PMS2, POLD1, POLE, PTEN, RAD51C, RAD51D, SDHA (sequencing analysis only except exon 14), SDHB, SDHC, SDHD, SMAD4, SMARCA4. STK11, TP53, TSC1,  TSC2, and VHL.  Based on Ms. Demeter's personal and family history of cancer, she meets medical criteria for genetic testing. Despite that she meets criteria, she may still have an out of pocket cost. We discussed that if her out of pocket cost for testing is over $100, the laboratory should contact them to discuss self-pay prices, patient pay assistance programs, if applicable, and other billing options.   PLAN: After considering the risks, benefits, and limitations, Ms. Basnett provided informed consent to pursue genetic testing and the blood sample was sent to [facility] for analysis of the Common Cancer Panel. Results should be available within approximately 1-2 weeks' time, at which point they will be disclosed by telephone to Ms. Plantz, as will any additional recommendations warranted by these results. Ms. Ruggles will receive a summary of her genetic counseling visit and a copy of her results once available. This information will also be available in Epic.   Ms. Elliff's questions were answered to her satisfaction today. Our contact information was provided should additional questions or concerns arise. Thank you for the referral and allowing [place] to share in the care of your patient.   Lucille Passy, MS, [facility] Genetic Counselor [email or web address] (P) [phone]  The patient was seen for a total of 20 minutes in face-to-face genetic counseling.  The patient brought her husband.  Drs. Lindi Adie and/or Burr Medico were available to discuss this case as needed.  _______________________________________________________________________ For Office Staff:  Number of people involved in session: 2 Was an Intern/ student involved with case: no

## 2022-10-20 NOTE — Therapy (Signed)
OUTPATIENT PHYSICAL THERAPY BREAST CANCER BASELINE EVALUATION   Patient Name: [name] [name] MRN: [id number] DOB:12-Dec-1950, 72 y.o., female Today's Date: 10/20/2022  END OF SESSION:  PT End of Session - 10/20/22 1335     Visit Number 1    Number of Visits 2    Date for PT Re-Evaluation 12/15/22    PT Start Time 1430    PT Stop Time [id number]   Also saw pt from [id number] for a total of 53 min   PT Time Calculation (min) 35 min    Activity Tolerance Patient tolerated treatment well    Behavior During Therapy WFL for tasks assessed/performed             Past Medical History:  Diagnosis Date   ADHD (attention deficit hyperactivity disorder)    Anemia    Anxiety    Arthritis    Depression    GERD (gastroesophageal reflux disease)    History of panic attacks    Hyperlipidemia    Hypertension    Hypothyroidism, postsurgical    Pre-diabetes    SBO (small bowel obstruction) ([place]) 01/15/2020   SUI (stress urinary incontinence, female)    Wears glasses    Past Surgical History:  Procedure Laterality Date   BREAST BIOPSY Right 10/07/2022   BREAST BIOPSY Right 10/07/2022   [place] RT BREAST BX W LOC DEV 1ST LESION IMG BX SPEC US GUIDE 10/07/2022 [facility]   D & C HYSTEROSCOPY W/ RESECTION ENDOMETRIAL POLYP  04/15/2009   D & C HYSTEROSCOPY W/ RESECTOSCOPE AND THERMACHOICE ENDOMETRIAL ABLATION  12/09/2010   LAPAROSCOPIC ASSISTED VENTRAL HERNIA REPAIR Left 01/16/2020   Procedure: LAPAROSCOPIC ASSISTED HERNIA REPAIR;  Surgeon: Clovis Riley, MD;  Location: [facility];  Service: General;  Laterality: Left;   PUBOVAGINAL SLING N/A 07/26/2016   Procedure: PUBO-VAGINAL SLING ALTIS Lindley Magnus;  Surgeon: Carolan Clines, MD;  Location: [place];  Service: Urology;  Laterality: N/A;   REPAIR SPIGELIAN HERNIA     RESECTION GIANT CELL TUMOR RIGHT LONG FINGER  09/13/2008   REVERSE SHOULDER ARTHROPLASTY Right 07/16/2021   Procedure: REVERSE SHOULDER ARTHROPLASTY;  Surgeon:  Tania Ade, MD;  Location: [facility];  Service: Orthopedics;  Laterality: Right;   ROTATOR CUFF REPAIR Bilateral    THYROIDECTOMY  1979    [facility]   non-malignant diseased thyroid   THYROIDECTOMY Left 03/17/2021   Procedure: COMPLETION THYROIDECTOMY, LEFT LOBE;  Surgeon: Armandina Gemma, MD;  Location[facility];  Service: General;  Laterality: Left;   TOTAL KNEE ARTHROPLASTY Right 12/17/2019   Procedure: RIGHT TOTAL KNEE ARTHROPLASTY;  Surgeon: Frederik Pear, MD;  Location[facility];  Service: Orthopedics;  Laterality: Right;   TOTAL KNEE ARTHROPLASTY Left 03/03/2020   Procedure: LEFT TOTAL KNEE ARTHROPLASTY;  Surgeon: Frederik Pear, MD;  [facility];  Service: Orthopedics;  Laterality: Left;   TRIPLE ARTHRODESIS LEFT FOOT  04/20/2007   VENTRAL HERNIA REPAIR  2003   WRIST GANGLION EXCISION Right 1975   Patient Active Problem List   Diagnosis Date Noted   Malignant neoplasm of lower-inner quadrant of right breast of female, estrogen receptor positive ([place]) 10/18/2022   Neoplasm of uncertain behavior of thyroid gland 03/15/2021   Left thyroid nodule 03/15/2021   S/P TKR (total knee replacement) using cement, left 03/03/2020   Degenerative arthritis of left knee 02/29/2020   SBO (small bowel obstruction) ([place]) 01/15/2020   S/P TKR (total knee replacement), right 12/19/2019   Total knee replacement status, right 12/17/2019   Osteoarthritis of  right knee 12/14/2019   Obesity, morbid ([place]) 12/16/2016   Strain of iliopsoas muscle, initial encounter 07/15/2016   Elevated cholesterol    Urinary incontinence    Endometrial polyp    Primary osteoarthritis of knees, bilateral 05/15/2009   DERANGEMENT OF ANTERIOR HORN OF LATERAL MENISCUS 05/15/2009   KNEE PAIN 05/15/2009    REFERRING PROVIDER: Dr. Rolm Bookbinder  REFERRING DIAG: Right breast cancer  THERAPY DIAG:  Malignant neoplasm of lower-inner quadrant of right breast of female, estrogen receptor positive ([place])  Abnormal  posture  Stiffness of right shoulder, not elsewhere classified  Rationale for Evaluation and Treatment: Rehabilitation  ONSET DATE: 09/21/2022  SUBJECTIVE:                                                                                                                                                                                           SUBJECTIVE STATEMENT: Patient reports she is here today to be seen by her medical team for her newly diagnosed right breast cancer.   PERTINENT HISTORY:  Patient was diagnosed on 09/21/2022 with right grade 2 invasive ductal carcinoma breast cancer. It measures 8 mm and is located in the lower inner quadrant. It is triple positive (although her pathology is being verified by [facility]) with a Ki67 of 5%. Hx left rotator cuff repair 08/05/20. Pins placed in left foot 2003. Hx of right shoulder replacement 07/16/2021.  PATIENT GOALS:   reduce lymphedema risk and learn post op HEP.   PAIN:  Are you having pain? Yes: NPRS scale: 2/10 Pain location: Right shoulder Pain description: tight and achy Aggravating factors: reaching Relieving factors: Rest  PRECAUTIONS: Active CA Other: previous right shoulder replacement 07/16/2021  HAND DOMINANCE: right  WEIGHT BEARING RESTRICTIONS: No  FALLS:  Has patient fallen in last 6 months? No  LIVING ENVIRONMENT: Patient lives with: her husband Lives in: House/apartment Has following equipment at home: None  OCCUPATION: Retired  LEISURE: She does not exercise  PRIOR LEVEL OF FUNCTION: Independent   OBJECTIVE:  COGNITION: Overall cognitive status: Within functional limits for tasks assessed    POSTURE:  Forward head and rounded shoulders posture  UPPER EXTREMITY AROM/PROM:  A/PROM RIGHT   eval   Shoulder extension 41  Shoulder flexion 81  Shoulder abduction 60  Shoulder internal rotation NT due to limited abduction  Shoulder external rotation NT due to limited abduction    (Blank rows = not  tested)  A/PROM LEFT   eval  Shoulder extension 52  Shoulder flexion 131  Shoulder abduction 159  Shoulder internal rotation 70  Shoulder external rotation 80    (Blank rows = not tested)  CERVICAL AROM: All within normal limits:    Percent limited  Flexion WNL  Extension 75% limited  Right lateral flexion 75% limited  Left lateral flexion 75% limited  Right rotation 50% limited   Left rotation 50% limited    UPPER EXTREMITY STRENGTH: NT  LYMPHEDEMA ASSESSMENTS:   LANDMARK RIGHT   eval  10 cm proximal to olecranon process 41.3  Olecranon process 30.8  10 cm proximal to ulnar styloid process 25.6  Just proximal to ulnar styloid process 15.9  Across hand at thumb web space 19  At base of 2nd digit 5.8  (Blank rows = not tested)  LANDMARK LEFT   eval  10 cm proximal to olecranon process 40.8  Olecranon process 30.2  10 cm proximal to ulnar styloid process 23.1  Just proximal to ulnar styloid process 15  Across hand at thumb web space 18.6  At base of 2nd digit 5.6  (Blank rows = not tested)  L-DEX LYMPHEDEMA SCREENING:  The patient was assessed using the L-Dex machine today to produce a lymphedema index baseline score. The patient will be reassessed on a regular basis (typically every 3 months) to obtain new L-Dex scores. If the score is > 6.5 points away from his/her baseline score indicating onset of subclinical lymphedema, it will be recommended to wear a compression garment for 4 weeks, 12 hours per day and then be reassessed. If the score continues to be > 6.5 points from baseline at reassessment, we will initiate lymphedema treatment. Assessing in this manner has a 95% rate of preventing clinically significant lymphedema.   L-DEX FLOWSHEETS - 10/20/22 1500       L-DEX LYMPHEDEMA SCREENING   Measurement Type Unilateral    L-DEX MEASUREMENT EXTREMITY Upper Extremity    POSITION  Standing    DOMINANT SIDE Right    At Risk Side Right    BASELINE SCORE  (UNILATERAL) 3.7             QUICK DASH SURVEY:  Katina Dung - 10/20/22 0001     Open a tight or new jar No difficulty    Do heavy household chores (wash walls, wash floors) Mild difficulty    Carry a shopping bag or briefcase No difficulty    Wash your back Severe difficulty    Use a knife to cut food No difficulty    Recreational activities in which you take some force or impact through your arm, shoulder, or hand (golf, hammering, tennis) Mild difficulty    During the past week, to what extent has your arm, shoulder or hand problem interfered with your normal social activities with family, friends, neighbors, or groups? Not at all    During the past week, to what extent has your arm, shoulder or hand problem limited your work or other regular daily activities Not at all    Arm, shoulder, or hand pain. Mild    Tingling (pins and needles) in your arm, shoulder, or hand Mild    Difficulty Sleeping No difficulty    DASH Score 15.91 %              PATIENT EDUCATION:  Education details: Lymphedema risk reduction and post op shoulder/posture HEP Person educated: Patient Education method: Explanation, Demonstration, Handout Education comprehension: Patient verbalized understanding and returned demonstration  HOME EXERCISE PROGRAM: Patient was instructed today in a home exercise program today for post op shoulder range of motion. These included active assist shoulder flexion in sitting, scapular retraction, wall walking with  shoulder abduction, and hands behind head external rotation.  She was encouraged to do these twice a day, holding 3 seconds and repeating 5 times when permitted by her physician.   ASSESSMENT:  CLINICAL IMPRESSION: Patient was diagnosed on 09/21/2022 with right grade 2 invasive ductal carcinoma breast cancer. It measures 8 mm and is located in the lower inner quadrant. It is triple positive (although her pathology is being verified by [facility]) with a Ki67 of 5%.  Hx left rotator cuff repair 08/05/20. Pins placed in left foot 2003. Hx of right shoulder replacement 07/16/2021.Her multidisciplinary medical team met prior to her assessments to determine a recommended treatment plan. She is planning to have a right lumpectomy and sentinel node biopsy followed by possible chemotherapy, radiation, and anti-estrogen therapy. She will benefit from a post op PT reassessment to determine needs and from L-Dex screens every 3 months for 2 years to detect subclinical lymphedema.  Pt will benefit from skilled therapeutic intervention to improve on the following deficits: Decreased knowledge of precautions, impaired UE functional use, pain, decreased ROM, postural dysfunction.   PT treatment/interventions: ADL/self-care home management, pt/family education, therapeutic exercise  REHAB POTENTIAL: Excellent  CLINICAL DECISION MAKING: Stable/uncomplicated  EVALUATION COMPLEXITY: Low   GOALS: Goals reviewed with patient? YES  LONG TERM GOALS: (STG=LTG)    Name Target Date Goal status  1 Pt will be able to verbalize understanding of pertinent lymphedema risk reduction practices relevant to her dx specifically related to skin care.  Baseline:  No knowledge 10/20/2022 Achieved at eval  2 Pt will be able to return demo and/or verbalize understanding of the post op HEP related to regaining shoulder ROM. Baseline:  No knowledge 10/20/2022 Achieved at eval  3 Pt will be able to verbalize understanding of the importance of attending the post op After Breast CA Class for further lymphedema risk reduction education and therapeutic exercise.  Baseline:  No knowledge 10/20/2022 Achieved at eval  4 Pt will demo she has regained full shoulder ROM and function post operatively compared to baselines.  Baseline: See objective measurements taken today. 12/15/2022     PLAN:  PT FREQUENCY/DURATION: EVAL and 1 follow up appointment.   PLAN FOR NEXT SESSION: will reassess 3-4 weeks post op to  determine needs.   Patient will follow up at outpatient cancer rehab 3-4 weeks following surgery.  If the patient requires physical therapy at that time, a specific plan will be dictated and sent to the referring physician for approval. The patient was educated today on appropriate basic range of motion exercises to begin post operatively and the importance of attending the After Breast Cancer class following surgery.  Patient was educated today on lymphedema risk reduction practices as it pertains to recommendations that will benefit the patient immediately following surgery.  She verbalized good understanding.    Physical Therapy Information for After Breast Cancer Surgery/Treatment:  Lymphedema is a swelling condition that you may be at risk for in your arm if you have lymph nodes removed from the armpit area.  After a sentinel node biopsy, the risk is approximately 5-9% and is higher after an axillary node dissection.  There is treatment available for this condition and it is not life-threatening.  Contact your physician or physical therapist with concerns. You may begin the 4 shoulder/posture exercises (see additional sheet) when permitted by your physician (typically a week after surgery).  If you have drains, you may need to wait until those are removed before beginning range of motion exercises.  A general recommendation is to not lift your arms above shoulder height until drains are removed.  These exercises should be done to your tolerance and gently.  This is not a "no pain/no gain" type of recovery so listen to your body and stretch into the range of motion that you can tolerate, stopping if you have pain.  If you are having immediate reconstruction, ask your plastic surgeon about doing exercises as he or she may want you to wait. We encourage you to attend the free one time ABC (After Breast Cancer) class offered by [facility].  You will learn information related to  lymphedema risk, prevention and treatment and additional exercises to regain mobility following surgery.  You can call [phone] for more information.  This is offered the 1st and 3rd Monday of each month.  You only attend the class one time. While undergoing any medical procedure or treatment, try to avoid blood pressure being taken or needle sticks from occurring on the arm on the side of cancer.   This recommendation begins after surgery and continues for the rest of your life.  This may help reduce your risk of getting lymphedema (swelling in your arm). An excellent resource for those seeking information on lymphedema is the National Lymphedema Network's web site. It can be accessed at [place].org If you notice swelling in your hand, arm or breast at any time following surgery (even if it is many years from now), please contact your doctor or physical therapist to discuss this.  Lymphedema can be treated at any time but it is easier for you if it is treated early on.  If you feel like your shoulder motion is not returning to normal in a reasonable amount of time, please contact your surgeon or physical therapist.  [place] [phone]. [address], [place] [address]  ABC CLASS After Breast Cancer Class  After Breast Cancer Class is a specially designed exercise class to assist you in a safe recover after having breast cancer surgery.  In this class you will learn how to get back to full function whether your drains were just removed or if you had surgery a month ago.  This one-time class is held the 1st and 3rd Monday of every month from 11:00 a.m. until 12:00 noon virtually.  This class is FREE and space is limited. For more information or to register for the next available class, call [phone].  Class Goals  Understand specific stretches to improve the flexibility of you chest and shoulder. Learn ways to safely strengthen  your upper body and improve your posture. Understand the warning signs of infection and why you may be at risk for an arm infection. Learn about Lymphedema and prevention.  ** You do not attend this class until after surgery.  Drains must be removed to participate  Patient was instructed today in a home exercise program today for post op shoulder range of motion. These included active assist shoulder flexion in sitting, scapular retraction, wall walking with shoulder abduction, and hands behind head external rotation.  She was encouraged to do these twice a day, holding 3 seconds and repeating 5 times when permitted by her physician.   Annia Friendly, [place] 10/20/22 4:31 PM

## 2022-10-20 NOTE — Assessment & Plan Note (Signed)
10/07/2022:Screening mammogram detected right breast mass LIQ 0.8 cm, axilla negative, biopsy revealed grade 2 IDC ER 100%, PR 100%, Ki67 5%, HER2 3+ positive  Pathology and radiology counseling: Discussed with the patient, the details of pathology including the type of breast cancer,the clinical staging, the significance of ER, PR and HER-2/neu receptors and the implications for treatment. After reviewing the pathology in detail, we proceeded to discuss the different treatment options between surgery, radiation, chemotherapy, antiestrogen therapies.  Treatment plan: Breast conserving surgery Repeat breast prognostic panel on the biopsy to determine if she would benefit from adjuvant chemo with Taxol Herceptin (if she is truly HER2 positive) Adjuvant radiation Adjuvant antiestrogen therapy Genetic counseling  I discussed the Taxol Herceptin chemotherapy followed by Herceptin maintenance regimen in great detail.  Return to clinic after surgery to discuss the pathology report.

## 2022-10-21 DIAGNOSIS — Z803 Family history of malignant neoplasm of breast: Secondary | ICD-10-CM | POA: Insufficient documentation

## 2022-10-21 NOTE — Research (Signed)
Trial: [employer] 2021-05 - Specimen Collection Study to Evaluate Biomarkers in Subjects with Cancer   Patient [name] [name] was identified by Dr Lindi Adie as a potential candidate for the above listed study.  This Clinical Research Nurse met with CATLIN MCKIVER, [id number], on 10/21/22 in a manner and location that ensures patient privacy to discuss participation in the above listed research study.  Patient is Accompanied by her husband .  A copy of the informed consent document with embedded HIPAA language was provided to the patient.  Patient reads, speaks, and understands [place].    Patient was provided with the business card of this Nurse and encouraged to contact the research team with any questions.  Patient was provided the option of taking informed consent documents home to review and was encouraged to review at their convenience with their support network, including other care providers. Patient is comfortable with making a decision regarding study participation today.  As outlined in the informed consent form, this Nurse and [name] discussed the purpose of the research study, the investigational nature of the study, study procedures and requirements for study participation, potential risks and benefits of study participation, as well as alternatives to participation. This study is not blinded. The patient understands participation is voluntary and they may withdraw from study participation at any time.  This study does not involve randomization.  This study does not involve an investigational drug or device. This study does not involve a placebo. Patient understands enrollment is pending full eligibility review.   Confidentiality and how the patient's information will be used as part of study participation were discussed.  Patient was informed there is reimbursement provided for their time and effort spent on trial participation.  The patient is encouraged to discuss research study  participation with their insurance provider to determine what costs they may incur as part of study participation, including research related injury.    All questions were answered to patient's satisfaction.  The informed consent with embedded HIPAA language was reviewed page by page.  The patient's mental and emotional status is appropriate to provide informed consent, and the patient verbalizes an understanding of study participation.  Patient has agreed to participate in the above listed research study and has voluntarily signed the informed consent version 14 Sep 2021 with embedded HIPAA language, version 14 Sep 2021  on 10/20/22 at 4:50 PM.  The patient was provided with a copy of the signed informed consent form with embedded HIPAA language for their reference.  No study specific procedures were obtained prior to the signing of the informed consent document.  Approximately 15 minutes were spent with the patient reviewing the informed consent documents.  Patient was not requested to complete a Release of Information form.  Plan made for research team to follow patient's progression through the cancer center to coordinate a date/time for blood draw. Patient is aware, and she prefers calls in the afternoon.  Marjie Skiff Meili Kleckley, RN, BSN, [facility] She  Her  Hers Clinical Research Nurse [facility] Direct Dial [phone]  Pager [phone] 10/21/2022 10:09 AM

## 2022-10-22 ENCOUNTER — Encounter: Payer: Self-pay | Admitting: General Practice

## 2022-10-22 ENCOUNTER — Other Ambulatory Visit: Payer: Self-pay | Admitting: General Surgery

## 2022-10-22 DIAGNOSIS — Z170 Estrogen receptor positive status [ER+]: Secondary | ICD-10-CM

## 2022-10-22 NOTE — Progress Notes (Signed)
[facility] Spiritual Care Note  Followed up with [name] [name] by phone to address her question of a counseling referral for an EMDR provider. Suggested a search by insurance and intervention type via psychologytoday.com Find a Optician, dispensing. Provided name and contact information for specific providers as requested. [name] [name] also plans to contact a previous counselor for suggestions.  She is aware of ongoing Spiritual Care availability and plans to contact chaplain as needed/desired.   [place], [place], [facility] Pager [phone] Voicemail [phone]

## 2022-10-25 ENCOUNTER — Telehealth: Payer: Self-pay

## 2022-10-25 NOTE — Telephone Encounter (Signed)
[employer] 2021-05 - Specimen Collection Study to Evaluate Biomarkers in Subjects with Cancer   Called Mrs Hessel to follow up on study interest after consent signing 10/20/22; no answer; no voicemail option.  Marjie Skiff Tyronza Happe, RN, BSN, [facility] She  Her  Hers Clinical Research Nurse [place] [phone]  Pager [phone] 10/25/2022 4:30 PM

## 2022-10-27 ENCOUNTER — Telehealth: Payer: Self-pay | Admitting: Hematology and Oncology

## 2022-10-27 DIAGNOSIS — F4310 Post-traumatic stress disorder, unspecified: Secondary | ICD-10-CM | POA: Diagnosis not present

## 2022-10-27 NOTE — Telephone Encounter (Signed)
Per 3/13 IB reached out to patient to schedule post op, patient aware of date and time of appointment.

## 2022-10-28 ENCOUNTER — Telehealth: Payer: Self-pay | Admitting: *Deleted

## 2022-10-28 ENCOUNTER — Encounter: Payer: Self-pay | Admitting: *Deleted

## 2022-10-28 NOTE — Telephone Encounter (Signed)
Left message for a return phone call to follow up from [facility] 3/6

## 2022-10-29 DIAGNOSIS — R400 Somnolence: Secondary | ICD-10-CM | POA: Diagnosis not present

## 2022-10-29 DIAGNOSIS — G4733 Obstructive sleep apnea (adult) (pediatric): Secondary | ICD-10-CM | POA: Diagnosis not present

## 2022-11-01 ENCOUNTER — Telehealth: Payer: Self-pay | Admitting: Genetic Counselor

## 2022-11-01 ENCOUNTER — Encounter: Payer: Self-pay | Admitting: Genetic Counselor

## 2022-11-01 DIAGNOSIS — Z1379 Encounter for other screening for genetic and chromosomal anomalies: Secondary | ICD-10-CM | POA: Insufficient documentation

## 2022-11-01 NOTE — Telephone Encounter (Signed)
I contacted [name] [name] to discuss her genetic testing results. No pathogenic variants were identified in the 48 genes analyzed. Of note, a variant of uncertain significance was identified in the AXIN2 and TSC2 genes. Detailed clinic note to follow.  The test report has been scanned into EPIC and is located under the Molecular Pathology section of the Results Review tab.  A portion of the result report is included below for reference.   [name] Passy, MS, [facility] Genetic Counselor [name] (P) [phone]

## 2022-11-04 ENCOUNTER — Telehealth: Payer: Self-pay

## 2022-11-04 NOTE — Telephone Encounter (Signed)
[employer] 2021-05 - Specimen Collection Study to Evaluate Biomarkers in Subjects with Cancer   Called patient to schedule blood draw and re-consent (has been over 14 days). No answer, left VM.  Marjie Skiff Daviona Herbert, RN, BSN, [facility] She  Her  Hers Clinical Research Nurse [facility] Direct Dial [phone]  Pager [phone] 11/04/2022 2:12 PM

## 2022-11-05 ENCOUNTER — Telehealth: Payer: Self-pay

## 2022-11-05 ENCOUNTER — Other Ambulatory Visit: Payer: Self-pay

## 2022-11-05 DIAGNOSIS — Z170 Estrogen receptor positive status [ER+]: Secondary | ICD-10-CM

## 2022-11-05 NOTE — Telephone Encounter (Signed)
[employer] 2021-05 - Specimen Collection Study to Evaluate Biomarkers in Subjects with Cancer   Patient returned my call to schedule lab draw/re-consent. Appointment set for Monday 11/08/22.  Marjie Skiff Brieanna Nau, RN, BSN, [facility] She  Her  Hers Clinical Research Nurse [facility] Direct Dial [phone]  Pager [phone] 11/05/2022 1:54 PM

## 2022-11-08 ENCOUNTER — Inpatient Hospital Stay: Payer: PPO

## 2022-11-08 ENCOUNTER — Ambulatory Visit: Payer: Self-pay | Admitting: Genetic Counselor

## 2022-11-08 ENCOUNTER — Encounter: Payer: Self-pay | Admitting: Genetic Counselor

## 2022-11-08 DIAGNOSIS — Z1379 Encounter for other screening for genetic and chromosomal anomalies: Secondary | ICD-10-CM

## 2022-11-08 DIAGNOSIS — Z170 Estrogen receptor positive status [ER+]: Secondary | ICD-10-CM

## 2022-11-08 LAB — RESEARCH LABS

## 2022-11-08 NOTE — Research (Signed)
Trial Name:  [employer] 2021-05 - Specimen Collection Study to Evaluate Biomarkers in Subjects with Cancer   Patient had previously signed consents; reconsented today due to now being past cut off time for previous consent.  Patient [name] [name] was identified by Dr Lindi Adie as a potential candidate for the above listed study.  This Clinical Research Nurse met with [name] [name], [id number] on 11/08/22 in a manner and location that ensures patient privacy to discuss participation in the above listed research study.  Patient is Unaccompanied.  Patient was previously provided with informed consent documents.  Patient confirmed they have read the informed consent documents.  As outlined in the informed consent form, this Nurse and [name] discussed the purpose of the research study, the investigational nature of the study, study procedures and requirements for study participation, potential risks and benefits of study participation, as well as alternatives to participation.  This study is not blinded or double-blinded. The patient understands participation is voluntary and they may withdraw from study participation at any time.  This study does not involve randomization.  This study does not involve an investigational drug or device. This study does not involve a placebo. Patient understands enrollment is pending full eligibility review.   Confidentiality and how the patient's information will be used as part of study participation were discussed.  Patient was informed there is reimbursement provided for their time and effort spent on trial participation.  The patient is encouraged to discuss research study participation with their insurance provider to determine what costs they may incur as part of study participation, including research related injury.    All questions were answered to patient's satisfaction.  The informed consent with embedded HIPAA language was reviewed page by page.  The  patient's mental and emotional status is appropriate to provide informed consent, and the patient verbalizes an understanding of study participation.  Patient has agreed to participate in the above listed research study and has voluntarily signed the informed consent version date 14 Sep 2021 with embedded HIPAA language, version date 14 Sep 2021  on 11/08/22 at 1205PM.  The patient was provided with a copy of the signed informed consent form with embedded HIPAA language for their reference.  No study specific procedures were obtained prior to the signing of the informed consent document.  Approximately 10 minutes were spent with the patient reviewing the informed consent documents.  Patient was not requested to complete a Release of Information form.  Patient was escorted to lab. Once labs were drawn, gift card was provided and signed for.  Marjie Skiff Noelani Harbach, RN, BSN, [facility] She  Her  Hers Clinical Research Nurse [facility] Direct Dial [phone]  Pager [phone] 11/08/2022 12:29 PM

## 2022-11-08 NOTE — Research (Signed)
[employer] 2021-05 - Specimen Collection Study to Evaluate Biomarkers in Subjects with Cancer     This Coordinator has reviewed this patient's inclusion and exclusion criteria as a second review and confirms [name] [name] is eligible for study participation.  Patient may continue with enrollment.  [name], [facility] 11/08/2022 12:36 PM

## 2022-11-08 NOTE — Research (Addendum)
Medical History:  High Blood Pressure  Yes Coronary Artery Disease No Lupus    No Rheumatoid Arthritis  No Diabetes   No      Lynch Syndrome  No  Is the patient currently taking a magnesium supplement?   Yes If yes, dose and frequency? 400mg  q night Indication? Health maintenance  Start date? 11/05/22  Does the patient have a personal history of cancer (greater than 5 years ago)?  No  Does the patient have a family history of cancer in 1st or 2nd degree relatives? Yes If yes, Relationship(s) and Cancer type(s)? Sister: breast; mother: breast & uterine  Does the patient have history of alcohol consumption? Yes   If yes, current or former? Current  If former, year stopped? N/a Number of years? 50 Drinks per week? 3/year  Does the patient have history of cigarette, cigar, pipe, or chewing tobacco use?  Yes  If yes, current for former? former If yes, type (Cigarette, cigar, pipe, and/or chewing tobacco)? Cigarettes   If former, year stopped? 1976 Number of years? 4 Packs/number/containers per day? 2   This Nurse has reviewed this patient's inclusion and exclusion criteria and confirmed ONEIKA EXNER is eligible for study participation.  Patient will continue with enrollment.  Eligibility confirmed by treating investigator, who also agrees that patient should proceed with enrollment.  Marjie Skiff Charrisse Masley, RN, BSN, [facility] She  Her  Hers Clinical Research Nurse [facility] Direct Dial [phone]  Pager [phone] 11/08/2022 12:32 PM

## 2022-11-08 NOTE — Progress Notes (Signed)
HPI:   [name] [name] was previously seen in the [facility] clinic due to a personal and family history of cancer and concerns regarding a hereditary predisposition to cancer. Please refer to our prior cancer genetics clinic note for more information regarding our discussion, assessment and recommendations, at the time. [name] [name]'s recent genetic test results were disclosed to her, as were recommendations warranted by these results. These results and recommendations are discussed in more detail below.  CANCER HISTORY:  Oncology History  Malignant neoplasm of lower-inner quadrant of right breast of female, estrogen receptor positive ([place])  10/07/2022 Initial Diagnosis   Screening mammogram detected right breast mass LIQ 0.8 cm, axilla negative, biopsy revealed grade 2 IDC ER 100%, PR 100%, Ki67 5%, HER2 3+ positive   10/18/2022 Cancer Staging   Staging form: Breast, AJCC 8th Edition - Clinical stage from 10/18/2022: Stage IA (cT1b, cN0, cM0, G2, ER+, PR+, HER2+) - Signed by Hayden Pedro, PA-C on 10/18/2022 Stage prefix: Initial diagnosis Method of lymph node assessment: Clinical Histologic grading system: 3 grade system    Genetic Testing   [facility] Common Cancer Panel+RNA was Negative. Of note, a variant of uncertain significance was detected in the AXIN2 gene (c.1481C>T) and the TSC2 gene (c.4794C>A). Report date is 10/28/2022.  The Common Hereditary Cancers Panel offered by [facility] includes sequencing and/or deletion duplication testing of the following 48 genes: APC, ATM, AXIN2, BAP1, BARD1, BMPR1A, BRCA1, BRCA2, BRIP1, CDH1, CDK4, CDKN2A (p14ARF and p16INK4a only), CHEK2, CTNNA1, DICER1, EPCAM (Deletion/duplication testing only), FH, GREM1 (promoter region duplication testing only), HOXB13, KIT, MBD4, MEN1, MLH1, MSH2, MSH3, MSH6, MUTYH, NF1, NHTL1, PALB2, PDGFRA, PMS2, POLD1, POLE, PTEN, RAD51C, RAD51D, SDHA (sequencing analysis only except exon 14), SDHB, SDHC, SDHD, SMAD4,  SMARCA4. STK11, TP53, TSC1, TSC2, and VHL.     FAMILY HISTORY:  We obtained a detailed, 4-generation family history.  Significant diagnoses are listed below:      Family History  Problem Relation Age of Onset   Breast cancer Mother 25   Uterine cancer Mother     Breast cancer Sister 54   Diabetes Maternal Grandfather     Heart disease Paternal Grandfather         [name] [name]'s sister was diagnosed with breast cancer at age 29, [name] [name] had a mastectomy and chemotherapy as her treatment. Her mother was diagnosed with breast cancer at age 59, [name] [name] had a mastectomy, radiation, and tamoxifen as her treatment. [name] [name] was later diagnosed with uterine cancer, [name] [name] died at age 70. [name] [name] is unaware of previous family history of genetic testing for hereditary cancer risks. There is no reported Ashkenazi Jewish ancestry.   GENETIC TEST RESULTS:  The [employer] Common Cancer Panel found no pathogenic mutations.  The Common Hereditary Cancers Panel offered by [facility] includes sequencing and/or deletion duplication testing of the following 48 genes: APC, ATM, AXIN2, BAP1, BARD1, BMPR1A, BRCA1, BRCA2, BRIP1, CDH1, CDK4, CDKN2A (p14ARF and p16INK4a only), CHEK2, CTNNA1, DICER1, EPCAM (Deletion/duplication testing only), FH, GREM1 (promoter region duplication testing only), HOXB13, KIT, MBD4, MEN1, MLH1, MSH2, MSH3, MSH6, MUTYH, NF1, NHTL1, PALB2, PDGFRA, PMS2, POLD1, POLE, PTEN, RAD51C, RAD51D, SDHA (sequencing analysis only except exon 14), SDHB, SDHC, SDHD, SMAD4, SMARCA4. STK11, TP53, TSC1, TSC2, and VHL.  The test report has been scanned into EPIC and is located under the Molecular Pathology section of the Results Review tab.  A portion of the result report is included below for reference. Genetic testing reported out on 10/28/2022.  Genetic testing identified a variant of uncertain significance (VUS) in the AXIN2 gene and the TSC2 gene. At this time, it is unknown if the variants are associated with an  increased risk for cancer or if they are benign, but most uncertain variants are reclassified to benign. It should not be used to make medical management decisions. With time, we suspect the laboratory will determine the significance of the variants, if any. If the laboratory reclassifies the variants, we will attempt to contact [name] [name] to discuss it further.   Even though a pathogenic variant was not identified, possible explanations for the cancer in the family may include: There may be no hereditary risk for cancer in the family. The cancers in [name] [name] and/or her family may be due to other genetic or environmental factors. There may be a gene mutation in one of these genes that current testing methods cannot detect, but that chance is small. There could be another gene that has not yet been discovered, or that we have not yet tested, that is responsible for the cancer diagnoses in the family.  It is also possible there is a hereditary cause for the cancer in the family that [name] [name] did not inherit.  Therefore, it is important to remain in touch with cancer genetics in the future so that we can continue to offer [name] [name] the most up to date genetic testing.   ADDITIONAL GENETIC TESTING:  We discussed with [name] [name] that her genetic testing was fairly extensive.  If there are genes identified to increase cancer risk that can be analyzed in the future, we would be happy to discuss and coordinate this testing at that time.    CANCER SCREENING RECOMMENDATIONS:  [name] [name]'s test result is considered negative (normal).  This means that we have not identified a hereditary cause for her personal and family history of cancer at this time.   An individual's cancer risk and medical management are not determined by genetic test results alone. Overall cancer risk assessment incorporates additional factors, including personal medical history, family history, and any available genetic information that may  result in a personalized plan for cancer prevention and surveillance. Therefore, it is recommended [name] [name] continue to follow the cancer management and screening guidelines provided by her oncology and primary healthcare provider.  RECOMMENDATIONS FOR FAMILY MEMBERS:   Since [name] [name] did not inherit a mutation in a cancer predisposition gene included on this panel, her children could not have inherited a mutation from her in one of these genes. Individuals in this family might be at some increased risk of developing cancer, over the general population risk, due to the family history of cancer. We recommend women in this family have a yearly mammogram beginning at age 85, or 45 years younger than the earliest onset of cancer, an annual clinical breast exam, and perform monthly breast self-exams. We do not recommend familial testing for the variants of uncertain significance (VUS).  FOLLOW-UP:  Cancer genetics is a rapidly advancing field and it is possible that new genetic tests will be appropriate for her and/or her family members in the future. We encouraged her to remain in contact with cancer genetics on an annual basis so we can update her personal and family histories and let her know of advances in cancer genetics that may benefit this family.   Our contact number was provided. [name] [name]'s questions were answered to her satisfaction, and [name] [name] knows [name] [name] is welcome to call us at anytime with  additional questions or concerns.   Lucille Passy, MS, [facility] Genetic Counselor [email or web address] (P) [phone]

## 2022-11-09 ENCOUNTER — Telehealth: Payer: Self-pay | Admitting: *Deleted

## 2022-11-09 NOTE — Progress Notes (Signed)
Surgical Instructions    Your procedure is scheduled on Friday, 11/19/22.  Report to [facility] Main Entrance "A" at 5:30 A.M., then check in with the Admitting office.  Call this number if you have problems the morning of surgery:  [phone]   If you have any questions prior to your surgery date call [phone]: Open Monday-Friday 8am-4pm If you experience any cold or flu symptoms such as cough, fever, chills, shortness of breath, etc. between now and your scheduled surgery, please notify us at the above number     Remember:  Do not eat after midnight the night before your surgery  You may drink clear liquids until 4:30am the morning of your surgery.   Clear liquids allowed are: Water, Non-Citrus Juices (without pulp), Carbonated Beverages, Clear Tea, Black Coffee ONLY (NO MILK, CREAM OR POWDERED CREAMER of any kind), and Gatorade  Patient Instructions  The night before surgery:  No food after midnight. ONLY clear liquids after midnight  The day of surgery (if you do NOT have diabetes):  Drink ONE (1) Pre-Surgery Clear Ensure by 4:30am the morning of surgery. Drink in one sitting. Do not sip.  This drink was given to you during your hospital  pre-op appointment visit. Nothing else to drink after completing the  Pre-Surgery Clear Ensure.           If you have questions, please contact your surgeon's office.     Take these medicines the morning of surgery with A SIP OF WATER:  buPROPion (WELLBUTRIN XL)  DULoxetine (CYMBALTA)  lamoTRIgine (LAMICTAL)  levothyroxine (SYNTHROID)  MYRBETRIQ  omeprazole (PRILOSEC)  simvastatin (ZOCOR)   IF NEEDED: ALPRAZolam (XANAX)   As of today, STOP taking any Aspirin (unless otherwise instructed by your surgeon) Aleve, Naproxen, Ibuprofen, Motrin, Advil, Goody's, BC's, all herbal medications, fish oil, and all vitamins.           Do not wear jewelry or makeup. Do not wear lotions, powders, perfumes or deodorant. Do not shave 48  hours prior to surgery.   Do not bring valuables to the hospital. Do not wear nail polish, gel polish, artificial nails, or any other type of covering on natural nails (fingers and toes) If you have artificial nails or gel coating that need to be removed by a nail salon, please have this removed prior to surgery. Artificial nails or gel coating may interfere with anesthesia's ability to adequately monitor your vital signs.  [place] [facility] is not responsible for any belongings or valuables.    Do NOT Smoke (Tobacco/Vaping)  24 hours prior to your procedure  If you use a CPAP at night, you may bring your mask for your overnight stay.   Contacts, glasses, hearing aids, dentures or partials may not be worn into surgery, please bring cases for these belongings   For patients admitted to the hospital, discharge time will be determined by your treatment team.   Patients discharged the day of surgery will not be allowed to drive home, and someone needs to stay with them for 24 hours.   SURGICAL WAITING ROOM VISITATION Patients having surgery or a procedure may have no more than 2 support people in the waiting area - these visitors may rotate.   Children under the age of 44 must have an adult with them who is not the patient. If the patient needs to stay at the hospital during part of their recovery, the visitor guidelines for inpatient rooms apply. Pre-op nurse will coordinate an appropriate time for  1 support person to accompany patient in pre-op.  This support person may not rotate.   Please refer to [email or web address] for the visitor guidelines for Inpatients (after your surgery is over and you are in a regular room).    Special instructions:    Oral Hygiene is also important to reduce your risk of infection.  Remember - BRUSH YOUR TEETH THE MORNING OF SURGERY WITH YOUR REGULAR TOOTHPASTE   [place]- Preparing For Surgery  Before  surgery, you can play an important role. Because skin is not sterile, your skin needs to be as free of germs as possible. You can reduce the number of germs on your skin by washing with CHG (chlorahexidine gluconate) Soap before surgery.  CHG is an antiseptic cleaner which kills germs and bonds with the skin to continue killing germs even after washing.     Please do not use if you have an allergy to CHG or antibacterial soaps. If your skin becomes reddened/irritated stop using the CHG.  Do not shave (including legs and underarms) for at least 48 hours prior to first CHG shower. It is OK to shave your face.  Please follow these instructions carefully.     Shower the NIGHT BEFORE SURGERY and the MORNING OF SURGERY with CHG Soap.   If you chose to wash your hair, wash your hair first as usual with your normal shampoo. After you shampoo, rinse your hair and body thoroughly to remove the shampoo.  Then [employer] and genitals (private parts) with your normal soap and rinse thoroughly to remove soap.  After that Use CHG Soap as you would any other liquid soap. You can apply CHG directly to the skin and wash gently with a scrungie or a clean washcloth.   Apply the CHG Soap to your body ONLY FROM THE NECK DOWN.  Do not use on open wounds or open sores. Avoid contact with your eyes, ears, mouth and genitals (private parts). Wash Face and genitals (private parts)  with your normal soap.   Wash thoroughly, paying special attention to the area where your surgery will be performed.  Thoroughly rinse your body with warm water from the neck down.  DO NOT shower/wash with your normal soap after using and rinsing off the CHG Soap.  Pat yourself dry with a CLEAN TOWEL.  Wear CLEAN PAJAMAS to bed the night before surgery  Place CLEAN SHEETS on your bed the night before your surgery  DO NOT SLEEP WITH PETS.   Day of Surgery: Take a shower with CHG soap. Wear Clean/Comfortable clothing the morning of  surgery Do not apply any deodorants/lotions.   Remember to brush your teeth WITH YOUR REGULAR TOOTHPASTE.    If you received a COVID test during your pre-op visit, it is requested that you wear a mask when out in public, stay away from anyone that may not be feeling well, and notify your surgeon if you develop symptoms. If you have been in contact with anyone that has tested positive in the last 10 days, please notify your surgeon.    Please read over the following fact sheets that you were given.

## 2022-11-09 NOTE — Telephone Encounter (Signed)
Spoke with patient to let her know that there was not enough tissue to perform repeat prognostics and will have to wait till after surgery and final pathology to repeat prognostics. Patient verbalized understanding.

## 2022-11-10 ENCOUNTER — Encounter (HOSPITAL_COMMUNITY)
Admission: RE | Admit: 2022-11-10 | Discharge: 2022-11-10 | Disposition: A | Discharge status: Home / Self Care | Payer: PPO | Source: Ambulatory Visit | Attending: General Surgery | Admitting: General Surgery

## 2022-11-10 ENCOUNTER — Other Ambulatory Visit: Payer: Self-pay

## 2022-11-10 ENCOUNTER — Encounter (HOSPITAL_COMMUNITY): Payer: Self-pay

## 2022-11-10 VITALS — BP 142/68 | HR 74 | Temp 98.2°F | Resp 18 | Ht 63.0 in | Wt 284.2 lb

## 2022-11-10 DIAGNOSIS — Z01810 Encounter for preprocedural cardiovascular examination: Secondary | ICD-10-CM | POA: Diagnosis not present

## 2022-11-10 DIAGNOSIS — I2510 Atherosclerotic heart disease of native coronary artery without angina pectoris: Secondary | ICD-10-CM

## 2022-11-10 DIAGNOSIS — F4310 Post-traumatic stress disorder, unspecified: Secondary | ICD-10-CM | POA: Diagnosis not present

## 2022-11-10 HISTORY — DX: Sleep apnea, unspecified: G47.30

## 2022-11-10 NOTE — Progress Notes (Signed)
PCP - Marilynne Drivers Cardiologist - denies  PPM/ICD - denies   Chest x-ray - n/a EKG - 11/10/22 Stress Test - 04/14/18 ECHO - denies Cardiac Cath - denies  Sleep Study - +OSA CPAP - wears nightly   ERAS Protcol -yes PRE-SURGERY Ensure or G2- ensure ordered and given  COVID TEST- not needed   Anesthesia review: no  Patient denies shortness of breath, fever, cough and chest pain at PAT appointment   All instructions explained to the patient, with a verbal understanding of the material. Patient agrees to go over the instructions while at home for a better understanding. Patient also instructed to self quarantine after being tested for COVID-19. The opportunity to ask questions was provided.

## 2022-11-11 DIAGNOSIS — Z1331 Encounter for screening for depression: Secondary | ICD-10-CM | POA: Diagnosis not present

## 2022-11-11 DIAGNOSIS — F900 Attention-deficit hyperactivity disorder, predominantly inattentive type: Secondary | ICD-10-CM | POA: Diagnosis not present

## 2022-11-11 DIAGNOSIS — F419 Anxiety disorder, unspecified: Secondary | ICD-10-CM | POA: Diagnosis not present

## 2022-11-11 DIAGNOSIS — F330 Major depressive disorder, recurrent, mild: Secondary | ICD-10-CM | POA: Diagnosis not present

## 2022-11-18 ENCOUNTER — Ambulatory Visit
Admission: RE | Admit: 2022-11-18 | Discharge: 2022-11-18 | Disposition: A | Discharge status: Home / Self Care | Payer: PPO | Source: Ambulatory Visit | Attending: General Surgery | Admitting: General Surgery

## 2022-11-18 DIAGNOSIS — C50311 Malignant neoplasm of lower-inner quadrant of right female breast: Secondary | ICD-10-CM

## 2022-11-18 DIAGNOSIS — C50911 Malignant neoplasm of unspecified site of right female breast: Secondary | ICD-10-CM | POA: Diagnosis not present

## 2022-11-18 HISTORY — PX: BREAST BIOPSY: SHX20

## 2022-11-19 ENCOUNTER — Ambulatory Visit (HOSPITAL_COMMUNITY): Payer: PPO | Admitting: General Practice

## 2022-11-19 ENCOUNTER — Encounter (HOSPITAL_COMMUNITY)
Admission: RE | Disposition: A | Discharge status: Home / Self Care | Payer: Self-pay | Source: Home / Self Care | Attending: General Surgery

## 2022-11-19 ENCOUNTER — Ambulatory Visit (HOSPITAL_COMMUNITY)
Admission: RE | Admit: 2022-11-19 | Discharge: 2022-11-19 | Disposition: A | Discharge status: Home / Self Care | Payer: PPO | Attending: General Surgery | Admitting: General Surgery

## 2022-11-19 ENCOUNTER — Other Ambulatory Visit: Payer: Self-pay

## 2022-11-19 ENCOUNTER — Ambulatory Visit
Admission: RE | Admit: 2022-11-19 | Discharge: 2022-11-19 | Disposition: A | Discharge status: Home / Self Care | Payer: PPO | Source: Ambulatory Visit | Attending: General Surgery | Admitting: General Surgery

## 2022-11-19 ENCOUNTER — Ambulatory Visit (HOSPITAL_BASED_OUTPATIENT_CLINIC_OR_DEPARTMENT_OTHER): Payer: PPO | Admitting: General Practice

## 2022-11-19 ENCOUNTER — Encounter (HOSPITAL_COMMUNITY): Payer: Self-pay | Admitting: General Surgery

## 2022-11-19 DIAGNOSIS — E039 Hypothyroidism, unspecified: Secondary | ICD-10-CM

## 2022-11-19 DIAGNOSIS — F32A Depression, unspecified: Secondary | ICD-10-CM | POA: Diagnosis not present

## 2022-11-19 DIAGNOSIS — Z170 Estrogen receptor positive status [ER+]: Secondary | ICD-10-CM | POA: Insufficient documentation

## 2022-11-19 DIAGNOSIS — E785 Hyperlipidemia, unspecified: Secondary | ICD-10-CM | POA: Diagnosis not present

## 2022-11-19 DIAGNOSIS — C50311 Malignant neoplasm of lower-inner quadrant of right female breast: Secondary | ICD-10-CM | POA: Diagnosis not present

## 2022-11-19 DIAGNOSIS — I10 Essential (primary) hypertension: Secondary | ICD-10-CM | POA: Diagnosis not present

## 2022-11-19 DIAGNOSIS — D649 Anemia, unspecified: Secondary | ICD-10-CM | POA: Diagnosis not present

## 2022-11-19 DIAGNOSIS — K219 Gastro-esophageal reflux disease without esophagitis: Secondary | ICD-10-CM | POA: Insufficient documentation

## 2022-11-19 DIAGNOSIS — Z87891 Personal history of nicotine dependence: Secondary | ICD-10-CM

## 2022-11-19 DIAGNOSIS — R7303 Prediabetes: Secondary | ICD-10-CM | POA: Diagnosis not present

## 2022-11-19 DIAGNOSIS — Z803 Family history of malignant neoplasm of breast: Secondary | ICD-10-CM | POA: Insufficient documentation

## 2022-11-19 DIAGNOSIS — F909 Attention-deficit hyperactivity disorder, unspecified type: Secondary | ICD-10-CM | POA: Insufficient documentation

## 2022-11-19 DIAGNOSIS — G4730 Sleep apnea, unspecified: Secondary | ICD-10-CM | POA: Diagnosis not present

## 2022-11-19 DIAGNOSIS — F419 Anxiety disorder, unspecified: Secondary | ICD-10-CM | POA: Diagnosis not present

## 2022-11-19 DIAGNOSIS — R928 Other abnormal and inconclusive findings on diagnostic imaging of breast: Secondary | ICD-10-CM | POA: Diagnosis not present

## 2022-11-19 DIAGNOSIS — G8918 Other acute postprocedural pain: Secondary | ICD-10-CM | POA: Diagnosis not present

## 2022-11-19 DIAGNOSIS — Z6841 Body Mass Index (BMI) 40.0 and over, adult: Secondary | ICD-10-CM | POA: Insufficient documentation

## 2022-11-19 DIAGNOSIS — D630 Anemia in neoplastic disease: Secondary | ICD-10-CM | POA: Diagnosis not present

## 2022-11-19 DIAGNOSIS — D638 Anemia in other chronic diseases classified elsewhere: Secondary | ICD-10-CM | POA: Diagnosis not present

## 2022-11-19 DIAGNOSIS — C50911 Malignant neoplasm of unspecified site of right female breast: Secondary | ICD-10-CM | POA: Diagnosis not present

## 2022-11-19 HISTORY — PX: EXCISION MELANOMA WITH SENTINEL LYMPH NODE BIOPSY: SHX5628

## 2022-11-19 HISTORY — PX: BREAST LUMPECTOMY WITH RADIOACTIVE SEED AND SENTINEL LYMPH NODE BIOPSY: SHX6550

## 2022-11-19 SURGERY — BREAST LUMPECTOMY WITH RADIOACTIVE SEED AND SENTINEL LYMPH NODE BIOPSY
Anesthesia: General | Site: Breast | Laterality: Right

## 2022-11-19 MED ORDER — DEXAMETHASONE SODIUM PHOSPHATE 10 MG/ML IJ SOLN
INTRAMUSCULAR | Status: DC | PRN
  Administered 2022-11-19: 10 mg via INTRAVENOUS

## 2022-11-19 MED ORDER — MAGTRACE LYMPHATIC TRACER
INTRAMUSCULAR | Status: DC | PRN
  Administered 2022-11-19: 2 mL via INTRAMUSCULAR

## 2022-11-19 MED ORDER — ONDANSETRON HCL 4 MG/2ML IJ SOLN
INTRAMUSCULAR | Status: DC | PRN
  Administered 2022-11-19: 4 mg via INTRAVENOUS

## 2022-11-19 MED ORDER — CHLORHEXIDINE GLUCONATE 0.12 % MT SOLN
OROMUCOSAL | Status: AC
  Administered 2022-11-19: 15 mL via OROMUCOSAL
  Filled 2022-11-19: qty 15

## 2022-11-19 MED ORDER — FENTANYL CITRATE (PF) 100 MCG/2ML IJ SOLN
INTRAMUSCULAR | Status: AC
  Administered 2022-11-19: 100 ug via INTRAVENOUS
  Filled 2022-11-19: qty 2

## 2022-11-19 MED ORDER — EPHEDRINE SULFATE-NACL 50-0.9 MG/10ML-% IV SOSY
PREFILLED_SYRINGE | INTRAVENOUS | Status: DC | PRN
  Administered 2022-11-19: 5 mg via INTRAVENOUS
  Administered 2022-11-19: 7.5 mg via INTRAVENOUS
  Administered 2022-11-19: 5 mg via INTRAVENOUS
  Administered 2022-11-19: 7.5 mg via INTRAVENOUS

## 2022-11-19 MED ORDER — HEMOSTATIC AGENTS (NO CHARGE) OPTIME
TOPICAL | Status: DC | PRN
  Administered 2022-11-19: 1 via TOPICAL

## 2022-11-19 MED ORDER — CHLORHEXIDINE GLUCONATE 0.12 % MT SOLN: 15.0000 mL | Freq: Once | OROMUCOSAL | Status: AC

## 2022-11-19 MED ORDER — TRAMADOL HCL 50 MG PO TABS: 50.0000 mg | ORAL_TABLET | Freq: Four times a day (QID) | ORAL | 0 refills | Status: DC | PRN

## 2022-11-19 MED ORDER — PHENYLEPHRINE 80 MCG/ML (10ML) SYRINGE FOR IV PUSH (FOR BLOOD PRESSURE SUPPORT)
PREFILLED_SYRINGE | INTRAVENOUS | Status: DC | PRN
  Administered 2022-11-19: 80 ug via INTRAVENOUS
  Administered 2022-11-19: 160 ug via INTRAVENOUS
  Administered 2022-11-19: 120 ug via INTRAVENOUS
  Administered 2022-11-19: 160 ug via INTRAVENOUS
  Administered 2022-11-19: 80 ug via INTRAVENOUS
  Administered 2022-11-19 (×3): 120 ug via INTRAVENOUS

## 2022-11-19 MED ORDER — PROPOFOL 10 MG/ML IV BOLUS
INTRAVENOUS | Status: AC
  Filled 2022-11-19: qty 20

## 2022-11-19 MED ORDER — LACTATED RINGERS IV SOLN: INTRAVENOUS | Status: DC

## 2022-11-19 MED ORDER — 0.9 % SODIUM CHLORIDE (POUR BTL) OPTIME
TOPICAL | Status: DC | PRN
  Administered 2022-11-19: 1000 mL

## 2022-11-19 MED ORDER — ENSURE PRE-SURGERY PO LIQD: 296.0000 mL | Freq: Once | ORAL | Status: DC

## 2022-11-19 MED ORDER — MIDAZOLAM HCL 2 MG/2ML IJ SOLN
INTRAMUSCULAR | Status: AC
  Administered 2022-11-19: 1 mg via INTRAVENOUS
  Filled 2022-11-19: qty 2

## 2022-11-19 MED ORDER — CIPROFLOXACIN IN D5W 400 MG/200ML IV SOLN
400.0000 mg | INTRAVENOUS | Status: AC
  Administered 2022-11-19: 400 mg via INTRAVENOUS

## 2022-11-19 MED ORDER — BUPIVACAINE HCL (PF) 0.25 % IJ SOLN
INTRAMUSCULAR | Status: AC
  Filled 2022-11-19: qty 30

## 2022-11-19 MED ORDER — ACETAMINOPHEN 500 MG PO TABS: 1000.0000 mg | ORAL_TABLET | ORAL | Status: AC

## 2022-11-19 MED ORDER — FENTANYL CITRATE (PF) 250 MCG/5ML IJ SOLN
INTRAMUSCULAR | Status: DC | PRN
  Administered 2022-11-19 (×3): 25 ug via INTRAVENOUS

## 2022-11-19 MED ORDER — LIDOCAINE 2% (20 MG/ML) 5 ML SYRINGE
INTRAMUSCULAR | Status: DC | PRN
  Administered 2022-11-19: 40 mg via INTRAVENOUS

## 2022-11-19 MED ORDER — PHENYLEPHRINE 80 MCG/ML (10ML) SYRINGE FOR IV PUSH (FOR BLOOD PRESSURE SUPPORT)
PREFILLED_SYRINGE | INTRAVENOUS | Status: AC
  Filled 2022-11-19: qty 10

## 2022-11-19 MED ORDER — MIDAZOLAM HCL 2 MG/2ML IJ SOLN: 1.0000 mg | Freq: Once | INTRAMUSCULAR | Status: AC

## 2022-11-19 MED ORDER — ACETAMINOPHEN 500 MG PO TABS
ORAL_TABLET | ORAL | Status: AC
  Administered 2022-11-19: 1000 mg via ORAL
  Filled 2022-11-19: qty 2

## 2022-11-19 MED ORDER — FENTANYL CITRATE (PF) 100 MCG/2ML IJ SOLN: 100.0000 ug | Freq: Once | INTRAMUSCULAR | Status: AC

## 2022-11-19 MED ORDER — CHLORHEXIDINE GLUCONATE CLOTH 2 % EX PADS: 6.0000 | MEDICATED_PAD | Freq: Once | CUTANEOUS | Status: DC

## 2022-11-19 MED ORDER — PROPOFOL 10 MG/ML IV BOLUS
INTRAVENOUS | Status: DC | PRN
  Administered 2022-11-19: 200 mg via INTRAVENOUS

## 2022-11-19 MED ORDER — BUPIVACAINE HCL 0.25 % IJ SOLN
INTRAMUSCULAR | Status: DC | PRN
  Administered 2022-11-19: 5 mL

## 2022-11-19 MED ORDER — ROPIVACAINE HCL 5 MG/ML IJ SOLN
INTRAMUSCULAR | Status: DC | PRN
  Administered 2022-11-19: 30 mL via PERINEURAL

## 2022-11-19 MED ORDER — FENTANYL CITRATE (PF) 250 MCG/5ML IJ SOLN
INTRAMUSCULAR | Status: AC
  Filled 2022-11-19: qty 5

## 2022-11-19 MED ORDER — CIPROFLOXACIN IN D5W 400 MG/200ML IV SOLN
INTRAVENOUS | Status: AC
  Filled 2022-11-19: qty 200

## 2022-11-19 MED ORDER — ORAL CARE MOUTH RINSE: 15.0000 mL | Freq: Once | OROMUCOSAL | Status: AC

## 2022-11-19 SURGICAL SUPPLY — 51 items
ADH SKN CLS APL DERMABOND .7 (GAUZE/BANDAGES/DRESSINGS) ×2
APL PRP STRL LF DISP 70% ISPRP (MISCELLANEOUS) ×2
APPLIER CLIP 9.375 MED OPEN (MISCELLANEOUS) ×2
APR CLP MED 9.3 20 MLT OPN (MISCELLANEOUS) ×2
BAG COUNTER SPONGE SURGICOUNT (BAG) ×3 IMPLANT
BAG SPNG CNTER NS LX DISP (BAG)
BINDER BREAST LRG (GAUZE/BANDAGES/DRESSINGS) IMPLANT
BINDER BREAST XLRG (GAUZE/BANDAGES/DRESSINGS) IMPLANT
CANISTER SUCT 3000ML PPV (MISCELLANEOUS) ×3 IMPLANT
CHLORAPREP W/TINT 26 (MISCELLANEOUS) ×3 IMPLANT
CLIP APPLIE 9.375 MED OPEN (MISCELLANEOUS) IMPLANT
CLIP TI MEDIUM 6 (CLIP) ×3 IMPLANT
CNTNR URN SCR LID CUP LEK RST (MISCELLANEOUS) IMPLANT
CONT SPEC 4OZ STRL OR WHT (MISCELLANEOUS) ×2
COVER PROBE W GEL 5X96 (DRAPES) ×6 IMPLANT
COVER SURGICAL LIGHT HANDLE (MISCELLANEOUS) ×3 IMPLANT
DERMABOND ADVANCED .7 DNX12 (GAUZE/BANDAGES/DRESSINGS) ×3 IMPLANT
DEVICE DUBIN SPECIMEN MAMMOGRA (MISCELLANEOUS) ×3 IMPLANT
DRAPE CHEST BREAST 15X10 FENES (DRAPES) ×3 IMPLANT
ELECT COATED BLADE 2.86 ST (ELECTRODE) ×3 IMPLANT
ELECT REM PT RETURN 9FT ADLT (ELECTROSURGICAL) ×2
ELECTRODE REM PT RTRN 9FT ADLT (ELECTROSURGICAL) ×3 IMPLANT
GLOVE BIO SURGEON STRL SZ7 (GLOVE) ×3 IMPLANT
GLOVE BIOGEL PI IND STRL 7.5 (GLOVE) ×3 IMPLANT
GOWN STRL REUS W/ TWL LRG LVL3 (GOWN DISPOSABLE) ×6 IMPLANT
GOWN STRL REUS W/TWL LRG LVL3 (GOWN DISPOSABLE) ×4
HEMOSTAT ARISTA ABSORB 3G PWDR (HEMOSTASIS) IMPLANT
ILLUMINATOR WAVEGUIDE N/F (MISCELLANEOUS) IMPLANT
KIT BASIN OR (CUSTOM PROCEDURE TRAY) ×3 IMPLANT
KIT MARKER MARGIN INK (KITS) ×3 IMPLANT
NDL 18GX1X1/2 (RX/OR ONLY) (NEEDLE) IMPLANT
NDL FILTER BLUNT 18X1 1/2 (NEEDLE) IMPLANT
NDL HYPO 25GX1X1/2 BEV (NEEDLE) ×3 IMPLANT
NEEDLE 18GX1X1/2 (RX/OR ONLY) (NEEDLE) IMPLANT
NEEDLE FILTER BLUNT 18X1 1/2 (NEEDLE) ×2 IMPLANT
NEEDLE HYPO 25GX1X1/2 BEV (NEEDLE) ×2 IMPLANT
NS IRRIG 1000ML POUR BTL (IV SOLUTION) ×3 IMPLANT
PACK GENERAL/GYN (CUSTOM PROCEDURE TRAY) ×3 IMPLANT
RETRACTOR ONETRAX LX 90X20 (MISCELLANEOUS) IMPLANT
STRIP CLOSURE SKIN 1/2X4 (GAUZE/BANDAGES/DRESSINGS) ×3 IMPLANT
SUT MNCRL AB 4-0 PS2 18 (SUTURE) ×6 IMPLANT
SUT MON AB 5-0 PS2 18 (SUTURE) IMPLANT
SUT SILK 2 0 SH (SUTURE) IMPLANT
SUT VIC AB 2-0 SH 27 (SUTURE) ×4
SUT VIC AB 2-0 SH 27XBRD (SUTURE) ×6 IMPLANT
SUT VIC AB 3-0 SH 27 (SUTURE) ×4
SUT VIC AB 3-0 SH 27X BRD (SUTURE) ×6 IMPLANT
SYR CONTROL 10ML LL (SYRINGE) ×3 IMPLANT
TOWEL GREEN STERILE (TOWEL DISPOSABLE) ×3 IMPLANT
TOWEL GREEN STERILE FF (TOWEL DISPOSABLE) ×3 IMPLANT
TRACER MAGTRACE VIAL (MISCELLANEOUS) IMPLANT

## 2022-11-19 NOTE — Anesthesia Procedure Notes (Signed)
Procedure Name: LMA Insertion Date/Time: 11/19/2022 9:21 AM  Performed by: Maxine Glenn, CRNAPre-anesthesia Checklist: Patient identified, Emergency Drugs available, Suction available and Patient being monitored Patient Re-evaluated:Patient Re-evaluated prior to induction Oxygen Delivery Method: Circle System Utilized Preoxygenation: Pre-oxygenation with 100% oxygen Induction Type: IV induction Ventilation: Mask ventilation without difficulty LMA: LMA inserted LMA Size: 4.0 Number of attempts: 1 Airway Equipment and Method: Bite block Placement Confirmation: positive ETCO2 Tube secured with: Tape Dental Injury: Teeth and Oropharynx as per pre-operative assessment

## 2022-11-19 NOTE — H&P (Signed)
60 yof with no prior breast history, she has fh mom 60s/sister at 55 had a screening mm. She has a lower inner quadrant mass that is 8x5x5 mm on [place]. Her axillary [place] is negative. Biopsy of the mass is grade II IDC that is 100% er/pr pos, her 2 positive and Ki is 5%. She is here with her husband to discuss options  Review of Systems: A complete review of systems was obtained from the patient. I have reviewed this information and discussed as appropriate with the patient. See HPI as well for other ROS.  Review of Systems  All other systems reviewed and are negative.   Medical History: Past Medical History:  Diagnosis Date  ADHD  ADHD  Ankle dislocation 1992  Anxiety  Depression  Essential hypertension  GERD (gastroesophageal reflux disease)  Hyperlipidemia  Hypothyroidism (acquired)  Knee dislocation 1972, 1982  Prediabetes   Patient Active Problem List  Diagnosis  Essential hypertension  Hyperlipidemia  Hypothyroidism (acquired)  Knee dislocation  Ankle dislocation  ADHD  Depression  GERD (gastroesophageal reflux disease)  Prediabetes  Anxiety  ADHD  Primary osteoarthritis of knees, bilateral  History of lobectomy of thyroid  Malignant neoplasm of lower-inner quadrant of right breast of female, estrogen receptor positive   Past Surgical History:  Procedure Laterality Date  THYROIDECTOMY TOTAL 1979  REPAIR INCISIONAL/VENTRAL HERNIA 2003  FOOT ARTHRODESIS, TRIPLE Left 2008  PUBOVAGINAL SLING SYNTHETIC 2017  ARTHROSCOPIC ROTATOR CUFF REPAIR Bilateral  HERNIA REPAIR  JOINT REPLACEMENT  thyroidectomy    Allergies  Allergen Reactions  Bactrim [Sulfamethoxazole-Trimethoprim] Rash  Ceclor [Cefaclor] Rash   Current Outpatient Medications on File Prior to Visit  Medication Sig Dispense Refill  ALPRAZolam (XANAX) 0.5 MG tablet Take 0.5 mg by mouth nightly as needed for Sleep  aspirin 81 MG EC tablet Take 81 mg by mouth once daily  buPROPion (WELLBUTRIN XL) 300 MG XL  tablet Take 300 mg by mouth once daily  DULoxetine (CYMBALTA) 60 MG DR capsule Take 120 mg by mouth once daily  ibuprofen (ADVIL,MOTRIN) 200 MG tablet Take 200 mg by mouth every 6 (six) hours as needed for Pain  irbesartan (AVAPRO) 150 MG tablet Take 150 mg by mouth once daily  levothyroxine (SYNTHROID, LEVOTHROID) 125 MCG tablet Take 125 mcg by mouth once daily Take on an empty stomach with a glass of water at least 30-60 minutes before breakfast.  lisdexamfetamine (VYVANSE) 50 MG capsule Take 50 mg by mouth every morning  omeprazole (PRILOSEC) 20 MG DR capsule Take 20 mg by mouth once daily  ranitidine (ZANTAC) 75 MG tablet Take 75 mg by mouth 2 (two) times daily  simvastatin (ZOCOR) 40 MG tablet Take 40 mg by mouth nightly    Family History  Problem Relation Age of Onset  Breast cancer Mother  Kidney disease Mother  Depression Father  Depression Sister  Obesity Sister  Lung disease Daughter    Social History   Tobacco Use  Smoking Status Former  Packs/day: 2.00  Years: 6.00  Additional pack years: 0.00  Total pack years: 12.00  Types: Cigarettes  Quit date: 10/09/1974  Years since quitting: 48.0  Smokeless Tobacco Never  Marital status: Married  Tobacco Use  Smoking status: Former  Packs/day: 2.00  Years: 6.00  Additional pack years: 0.00  Total pack years: 12.00  Types: Cigarettes  Quit date: 10/09/1974  Years since quitting: 48.0  Smokeless tobacco: Never  Substance and Sexual Activity  Alcohol use: Not Currently  Drug use: Not Currently  Objective:   Physical Exam Vitals reviewed.  Constitutional:  Appearance: Normal appearance.  Chest:  Breasts: Right: No inverted nipple, mass or nipple discharge.  Left: No inverted nipple, mass or nipple discharge.  Lymphadenopathy:  Upper Body:  Right upper body: No supraclavicular or axillary adenopathy.  Left upper body: No supraclavicular or axillary adenopathy.  Neurological:  Mental Status: She is alert.    Assessment and Plan:   Right breast cancer  Right breast seed guided lumpectomy, right ax sn biopsy  We discussed the staging and pathophysiology of breast cancer. We discussed all of the different options for treatment for breast cancer including surgery, chemotherapy, radiation therapy, Herceptin, and antiestrogen therapy. Will hold on port until final pathology is back from surgery.   We discussed a sentinel lymph node biopsy as she does not appear to having lymph node involvement right now. We discussed the performance of that with injection of radioactive tracer. We discussed that there is a chance of having a positive node with a sentinel lymph node biopsy and we will await the permanent pathology to make any other first further decisions in terms of her treatment. We discussed up to a 5% risk lifetime of chronic shoulder pain as well as lymphedema associated with a sentinel lymph node biopsy.  We discussed the options for treatment of the breast cancer which included lumpectomy versus a mastectomy. We discussed the performance of the lumpectomy with radioactive seed placement. We discussed a 5-10% chance of a positive margin requiring reexcision in the operating room. We also discussed that she will likely need radiation therapy if she undergoes lumpectomy. We discussed mastectomy and the postoperative care for that as well. Mastectomy can be followed by reconstruction. The decision for lumpectomy vs mastectomy has no impact on decision for chemotherapy. Most mastectomy patients will not need radiation therapy. We discussed that there is no difference in her survival whether she undergoes lumpectomy with radiation therapy or antiestrogen therapy versus a mastectomy. There is also no real difference between her recurrence in the breast.  We discussed the risks of operation including bleeding, infection, possible reoperation. She understands her further therapy will be based on what her stages  at the time of her operation.

## 2022-11-19 NOTE — Anesthesia Procedure Notes (Signed)
Anesthesia Regional Block: Pectoralis block   Pre-Anesthetic Checklist: , timeout performed,  Correct Patient, Correct Site, Correct Laterality,  Correct Procedure, Correct Position, site marked,  Risks and benefits discussed,  Surgical consent,  Post-op pain management  Laterality: Right  Prep: chloraprep       Needles:  Injection technique: Single-shot  Needle Type: Stimiplex     Needle Length: 9cm  Needle Gauge: 21     Additional Needles:   Procedures:,,,, ultrasound used (permanent image in chart),,    Narrative:  Start time: 11/19/2022 8:41 AM End time: 11/19/2022 8:46 AM Injection made incrementally with aspirations every 5 mL.  Performed by: Personally  Anesthesiologist: Lowella Curb, MD

## 2022-11-19 NOTE — Interval H&P Note (Signed)
History and Physical Interval Note:  11/19/2022 8:35 AM  [name] [name]  has presented today for surgery, with the diagnosis of RIGHT BREAST CANCER.  The various methods of treatment have been discussed with the patient and family. After consideration of risks, benefits and other options for treatment, the patient has consented to  Procedure(s): RIGHT BREAST LUMPECTOMY WITH RADIOACTIVE SEED AND AXILLARY SENTINEL LYMPH NODE BIOPSY (Right) as a surgical intervention.  The patient's history has been reviewed, patient examined, no change in status, stable for surgery.  I have reviewed the patient's chart and labs.  Questions were answered to the patient's satisfaction.     [name]

## 2022-11-19 NOTE — Anesthesia Preprocedure Evaluation (Signed)
Anesthesia Evaluation  Patient identified by MRN, date of birth, ID band Patient awake    Reviewed: Allergy & Precautions, H&P , NPO status , Patient's Chart, lab work & pertinent test results  Airway Mallampati: II  TM Distance: >3 FB Neck ROM: Full    Dental no notable dental hx.    Pulmonary sleep apnea , former smoker   Pulmonary exam normal breath sounds clear to auscultation       Cardiovascular hypertension, Pt. on medications negative cardio ROS Normal cardiovascular exam Rhythm:Regular Rate:Normal     Neuro/Psych   Anxiety Depression    negative neurological ROS  negative psych ROS   GI/Hepatic Neg liver ROS,GERD  ,,  Endo/Other  Hypothyroidism  Morbid obesity  Renal/GU negative Renal ROS  negative genitourinary   Musculoskeletal  (+) Arthritis , Osteoarthritis,    Abdominal  (+) + obese  Peds negative pediatric ROS (+)  Hematology  (+) Blood dyscrasia, anemia   Anesthesia Other Findings   Reproductive/Obstetrics negative OB ROS                             Anesthesia Physical Anesthesia Plan  ASA: 3  Anesthesia Plan: General   Post-op Pain Management: Regional block* and Tylenol PO (pre-op)*   Induction: Intravenous  PONV Risk Score and Plan: 3 and Ondansetron, Dexamethasone, Midazolam and Treatment may vary due to age or medical condition  Airway Management Planned: LMA  Additional Equipment:   Intra-op Plan:   Post-operative Plan: Extubation in OR  Informed Consent: I have reviewed the patients History and Physical, chart, labs and discussed the procedure including the risks, benefits and alternatives for the proposed anesthesia with the patient or authorized representative who has indicated his/her understanding and acceptance.     Dental advisory given  Plan Discussed with: CRNA  Anesthesia Plan Comments:        Anesthesia Quick Evaluation

## 2022-11-19 NOTE — Transfer of Care (Signed)
Immediate Anesthesia Transfer of Care Note  Patient: [name] [name]  Procedure(s) Performed: RIGHT BREAST LUMPECTOMY WITH RADIOACTIVE SEED (Right: Breast) SENTINEL LYMPH NODE BIOPSY (Right: Axilla)  Patient Location: PACU  Anesthesia Type:General and Regional  Level of Consciousness: awake and alert   Airway & Oxygen Therapy: Patient Spontanous Breathing  Post-op Assessment: Report given to RN and Post -op Vital signs reviewed and stable  Post vital signs: Reviewed and stable  Last Vitals:  Vitals Value Taken Time  BP    Temp    Pulse 76 11/19/22 1030  Resp 18 11/19/22 1031  SpO2 95 % 11/19/22 1030  Vitals shown include unvalidated device data.  Last Pain:  Vitals:   11/19/22 0840  TempSrc:   PainSc: 0-No pain         Complications: No notable events documented.

## 2022-11-19 NOTE — Discharge Instructions (Signed)
[facility] Office Phone Number [phone]  POST OP INSTRUCTIONS Take 400 mg of ibuprofen every 8 hours or 650 mg tylenol every 6 hours for next 72 hours then as needed. Use ice several times daily also.  A prescription for pain medication may be given to you upon discharge.  Take your pain medication as prescribed, if needed.  If narcotic pain medicine is not needed, then you may take acetaminophen (Tylenol), naprosyn (Alleve) or ibuprofen (Advil) as needed. Take your usually prescribed medications unless otherwise directed If you need a refill on your pain medication, please contact your pharmacy.  They will contact our office to request authorization.  Prescriptions will not be filled after 5pm or on week-ends. You should eat very light the first 24 hours after surgery, such as soup, crackers, pudding, etc.  Resume your normal diet the day after surgery. Most patients will experience some swelling and bruising in the breast.  Ice packs and a good support bra will help.  Wear the breast binder provided or a sports bra for 72 hours day and night.  After that wear a sports bra during the day until you return to the office. Swelling and bruising can take several days to resolve.  It is common to experience some constipation if taking pain medication after surgery.  Increasing fluid intake and taking a stool softener will usually help or prevent this problem from occurring.  A mild laxative (Milk of Magnesia or Miralax) should be taken according to package directions if there are no bowel movements after 48 hours. I used skin glue on the incision, you may shower in 24 hours.  The glue will flake off over the next 2-3 weeks.  Any sutures or staples will be removed at the office during your follow-up visit. ACTIVITIES:  You may resume regular daily activities (gradually increasing) beginning the next day.  Wearing a good support bra or sports bra minimizes pain and swelling.  You may have  sexual intercourse when it is comfortable. You may drive when you no longer are taking prescription pain medication, you can comfortably wear a seatbelt, and you can safely maneuver your car and apply brakes. RETURN TO WORK:  ______________________________________________________________________________________ You should see your doctor in the office for a follow-up appointment approximately two weeks after your surgery.  Your doctor's nurse will typically make your follow-up appointment when she calls you with your pathology report.  Expect your pathology report 3-4 business days after your surgery.  You may call to check if you do not hear from us after three days. OTHER INSTRUCTIONS: _______________________________________________________________________________________________ _____________________________________________________________________________________________________________________________________ _____________________________________________________________________________________________________________________________________ _____________________________________________________________________________________________________________________________________  WHEN TO CALL DR Happy Begeman: Fever over 101.0 Nausea and/or vomiting. Extreme swelling or bruising. Continued bleeding from incision. Increased pain, redness, or drainage from the incision.  The clinic staff is available to answer your questions during regular business hours.  Please don't hesitate to call and ask to speak to one of the nurses for clinical concerns.  If you have a medical emergency, go to the nearest emergency room or call 911.  A surgeon from [facility] is always on call at the hospital.  For further questions, please visit [email or web address]  

## 2022-11-19 NOTE — Op Note (Signed)
Preoperative diagnosis:  Clinical stage I right breast cancer Postoperative diagnosis: Same as above Procedure: 1.  Injection of mag trace for sentinel lymph node identification 2.  Right breast radioactive seed guided lumpectomy 3.  Right deep axillary sentinel lymph node biopsy Surgeon: Dr. Harden Mo Anesthesia: General with  pec block Estimated blood loss: 25 cc Drains: None Specimens: 1.  Right breast tissue marked with paint containing seed and clip 2.  Right deep axillary sentinel lymph nodes Complications: None Sponge needle count was correct completion Disposition recovery stable addition   Indications: 72 yof with no prior breast history, she has fh mom 60s/sister at 76 had a screening mm. She has a lower inner quadrant mass that is 8x5x5 mm on [place]. Her axillary [place] is negative. Biopsy of the mass is grade II IDC that is 100% er/pr pos, her 2 positive and Ki is 5%. We discussed lumpectomy, sn biopsy and will wait on port to see final pathology.  Procedure: After informed sent was obtained she first underwent a pectoral block on the right side.  She had a seed placed.  I had these mammograms in the operating room.  She was then placed under general esthesia without complication.  She was prepped and draped in standard sterile surgical fashion.  Surgical timeout was performed.   I first injected the mag trace in the subareolar position.  I injected 2 cc of mag trace in the subareolar position.  This was massaged for about 5 minutes.   I first did the  lumpectomy.  I made a curvilinear incision overlying the mass in the lower inner quadrant.  I then used the neoprobe again to locate the seed and removed the seed in the surrounding tissue with an attempt to get a clear margin.  Mammogram confirmed removal of the clip and the seed.  3D imaging showed that it look liked like all my margins were clear.  Clips were placed in the cavity.  Hemostasis was obtained.  I closed down the breast  tissue with 2-0 Vicryl.  The skin was closed with 3-0 Vicryl and 4 Monocryl.   I then made an incision right below the axillary hairline.  I carried this to the axillary fascia into the axilla.  I was able to identify the sentinel node and removed this.  I did remove some additional tissue around this as well.    There was no additional activity or any abnormal nodes present.  I obtained hemostasis.  I did place some Arista in the axilla.  I closed the axillary fascia with 2-0 Vicryl.  Skin was closed with 3-0 Vicryl and 4-0 Monocryl.  Glue insertion was applied.  She tolerated all this well was extubated transferred recovery stable.

## 2022-11-22 ENCOUNTER — Encounter (HOSPITAL_COMMUNITY): Payer: Self-pay | Admitting: General Surgery

## 2022-11-22 ENCOUNTER — Telehealth: Payer: Self-pay | Admitting: *Deleted

## 2022-11-22 NOTE — Telephone Encounter (Signed)
Request sent to repeat prognostic panel on final surgical specimen from 11/19/22 per Dr. Dwain Sarna.

## 2022-11-22 NOTE — Anesthesia Postprocedure Evaluation (Signed)
Anesthesia Post Note  Patient: [name] [name]  Procedure(s) Performed: RIGHT BREAST LUMPECTOMY WITH RADIOACTIVE SEED (Right: Breast) SENTINEL LYMPH NODE BIOPSY (Right: Axilla)     Patient location during evaluation: PACU Anesthesia Type: General Level of consciousness: awake and alert Pain management: pain level controlled Vital Signs Assessment: post-procedure vital signs reviewed and stable Respiratory status: spontaneous breathing, nonlabored ventilation and respiratory function stable Cardiovascular status: blood pressure returned to baseline and stable Postop Assessment: no apparent nausea or vomiting Anesthetic complications: no   No notable events documented.  Last Vitals:  Vitals:   11/19/22 1048 11/19/22 1100  BP: (!) 112/47 (!) 107/54  Pulse: 73 69  Resp: 17 15  Temp:  36.6 C  SpO2: 93% 95%    Last Pain:  Vitals:   11/19/22 1100  TempSrc:   PainSc: 0-No pain                 Lowella Curb

## 2022-11-24 LAB — SURGICAL PATHOLOGY

## 2022-11-25 ENCOUNTER — Telehealth: Payer: Self-pay | Admitting: *Deleted

## 2022-11-25 ENCOUNTER — Encounter: Payer: Self-pay | Admitting: *Deleted

## 2022-11-25 NOTE — Telephone Encounter (Signed)
Spoke with patient to inform her that HER2 was negative. She was happy to hear this news.  Ordered oncotype and requisition was sent to pathology and exact sciences.

## 2022-11-29 DIAGNOSIS — G4733 Obstructive sleep apnea (adult) (pediatric): Secondary | ICD-10-CM | POA: Diagnosis not present

## 2022-11-29 DIAGNOSIS — R400 Somnolence: Secondary | ICD-10-CM | POA: Diagnosis not present

## 2022-11-29 NOTE — Progress Notes (Signed)
Patient Care Team: Wilfrid Lund, PA as PCP - General (Family Medicine) [name], MD as Consulting Physician (General Surgery) Serena Croissant, MD as Consulting Physician (Hematology and Oncology) Dorothy Puffer, MD as Consulting Physician (Radiation Oncology) Donnelly Angelica, RN as Oncology Nurse Navigator Pershing Proud, RN as Oncology Nurse Navigator  DIAGNOSIS: No diagnosis found.  SUMMARY OF ONCOLOGIC HISTORY: Oncology History  Malignant neoplasm of lower-inner quadrant of right breast of female, estrogen receptor positive  10/07/2022 Initial Diagnosis   Screening mammogram detected right breast mass LIQ 0.8 cm, axilla negative, biopsy revealed grade 2 IDC ER 100%, PR 100%, Ki67 5%, HER2 3+ positive   10/18/2022 Cancer Staging   Staging form: Breast, AJCC 8th Edition - Clinical stage from 10/18/2022: Stage IA (cT1b, cN0, cM0, G2, ER+, PR+, HER2+) - Signed by Ronny Bacon, PA-C on 10/18/2022 Stage prefix: Initial diagnosis Method of lymph node assessment: Clinical Histologic grading system: 3 grade system    Genetic Testing   [facility] Common Cancer Panel+RNA was Negative. Of note, a variant of uncertain significance was detected in the AXIN2 gene (c.1481C>T) and the TSC2 gene (c.4794C>A). Report date is 10/28/2022.  The Common Hereditary Cancers Panel offered by [facility] includes sequencing and/or deletion duplication testing of the following 48 genes: APC, ATM, AXIN2, BAP1, BARD1, BMPR1A, BRCA1, BRCA2, BRIP1, CDH1, CDK4, CDKN2A (p14ARF and p16INK4a only), CHEK2, CTNNA1, DICER1, EPCAM (Deletion/duplication testing only), FH, GREM1 (promoter region duplication testing only), HOXB13, KIT, MBD4, MEN1, MLH1, MSH2, MSH3, MSH6, MUTYH, NF1, NHTL1, PALB2, PDGFRA, PMS2, POLD1, POLE, PTEN, RAD51C, RAD51D, SDHA (sequencing analysis only except exon 14), SDHB, SDHC, SDHD, SMAD4, SMARCA4. STK11, TP53, TSC1, TSC2, and VHL.     CHIEF COMPLIANT:   INTERVAL HISTORY: [name] [name] is  a   ALLERGIES:  is allergic to ceclor [cefaclor], bactrim [sulfamethoxazole-trimethoprim], and gabapentin.  MEDICATIONS:  Current Outpatient Medications  Medication Sig Dispense Refill   ALPRAZolam (XANAX) 0.5 MG tablet Take 0.5 mg by mouth daily as needed for anxiety.     buPROPion (WELLBUTRIN XL) 150 MG 24 hr tablet Take 150 mg by mouth daily.     CALCIUM PO Take 4 tablets by mouth daily. Mini Calcium Total dose 1200 mg     Cholecalciferol (VITAMIN D3) 125 MCG (5000 UT) TABS Take 5,000 Units by mouth daily.     DULoxetine (CYMBALTA) 30 MG capsule Take 30 mg by mouth daily.     DULoxetine (CYMBALTA) 60 MG capsule Take 60 mg by mouth in the morning.  2   ferrous sulfate 325 (65 FE) MG tablet Take 325 mg by mouth.     ibuprofen (ADVIL) 200 MG tablet Take 400-600 mg by mouth every 8 (eight) hours as needed for moderate pain.     irbesartan (AVAPRO) 75 MG tablet Take 75 mg by mouth in the morning.     lamoTRIgine (LAMICTAL) 25 MG tablet Take 25 mg by mouth 2 (two) times daily.     levothyroxine (SYNTHROID) 137 MCG tablet Take 137 mcg by mouth daily before breakfast.     lisdexamfetamine (VYVANSE) 50 MG capsule Take 50 mg by mouth in the morning.     MAGNESIUM GLYCINATE PO Take 400 mg by mouth daily.     Multiple Vitamin (MULTIVITAMIN) capsule Take 2 capsules by mouth daily.     MYRBETRIQ 50 MG TB24 tablet Take 50 mg by mouth in the morning.     omeprazole (PRILOSEC) 40 MG capsule Take 20 mg by mouth daily.  simvastatin (ZOCOR) 40 MG tablet Take 40 mg by mouth in the morning.  2   traMADol (ULTRAM) 50 MG tablet Take 1 tablet (50 mg total) by mouth every 6 (six) hours as needed. 10 tablet 0   vitamin k 100 MCG tablet Take 100 mcg by mouth daily.     No current facility-administered medications for this visit.    PHYSICAL EXAMINATION: ECOG PERFORMANCE STATUS: {CHL ONC ECOG [phone]}  There were no vitals filed for this visit. There were no vitals filed for this  visit.  BREAST:*** No palpable masses or nodules in either right or left breasts. No palpable axillary supraclavicular or infraclavicular adenopathy no breast tenderness or nipple discharge. (exam performed in the presence of a chaperone)  LABORATORY DATA:  I have reviewed the data as listed    Latest Ref Rng & Units 10/20/2022   12:18 PM 03/18/2021    4:14 AM 03/09/2021   11:55 AM  CMP  Glucose 70 - 99 mg/dL 92  562  92   BUN 8 - 23 mg/dL 17  16  15    Creatinine 0.44 - 1.00 mg/dL 5.63  8.93  7.34   Sodium 135 - 145 mmol/L 141  140  140   Potassium 3.5 - 5.1 mmol/L 4.0  4.1  4.3   Chloride 98 - 111 mmol/L 106  106  106   CO2 22 - 32 mmol/L 28  28  25    Calcium 8.9 - 10.3 mg/dL 9.0  8.0  9.5   Total Protein 6.5 - 8.1 g/dL 7.1     Total Bilirubin 0.3 - 1.2 mg/dL 0.4     Alkaline Phos 38 - 126 U/L 90     AST 15 - 41 U/L 15     ALT 0 - 44 U/L 14       Lab Results  Component Value Date   WBC 7.6 10/20/2022   HGB 12.5 10/20/2022   HCT 39.6 10/20/2022   MCV 86.7 10/20/2022   PLT 350 10/20/2022   NEUTROABS 4.5 10/20/2022    ASSESSMENT & PLAN:  No problem-specific Assessment & Plan notes found for this encounter.    No orders of the defined types were placed in this encounter.  The patient has a good understanding of the overall plan. she agrees with it. she will call with any problems that may develop before the next visit here. Total time spent: 30 mins including face to face time and time spent for planning, charting and co-ordination of care   [name] [name], CMA 11/29/22    I [name] [name] am acting as a Neurosurgeon for [employer]  ***

## 2022-11-30 ENCOUNTER — Inpatient Hospital Stay: Payer: PPO | Attending: Nurse Practitioner | Admitting: Hematology and Oncology

## 2022-11-30 VITALS — BP 140/61 | HR 89 | Temp 97.5°F | Resp 18 | Ht 63.0 in | Wt 290.0 lb

## 2022-11-30 DIAGNOSIS — F900 Attention-deficit hyperactivity disorder, predominantly inattentive type: Secondary | ICD-10-CM | POA: Diagnosis not present

## 2022-11-30 DIAGNOSIS — C50911 Malignant neoplasm of unspecified site of right female breast: Secondary | ICD-10-CM | POA: Diagnosis not present

## 2022-11-30 DIAGNOSIS — C50311 Malignant neoplasm of lower-inner quadrant of right female breast: Secondary | ICD-10-CM | POA: Insufficient documentation

## 2022-11-30 DIAGNOSIS — R7303 Prediabetes: Secondary | ICD-10-CM | POA: Diagnosis not present

## 2022-11-30 DIAGNOSIS — Z1331 Encounter for screening for depression: Secondary | ICD-10-CM | POA: Diagnosis not present

## 2022-11-30 DIAGNOSIS — E7800 Pure hypercholesterolemia, unspecified: Secondary | ICD-10-CM | POA: Diagnosis not present

## 2022-11-30 DIAGNOSIS — F331 Major depressive disorder, recurrent, moderate: Secondary | ICD-10-CM | POA: Diagnosis not present

## 2022-11-30 DIAGNOSIS — Z0000 Encounter for general adult medical examination without abnormal findings: Secondary | ICD-10-CM | POA: Diagnosis not present

## 2022-11-30 DIAGNOSIS — E039 Hypothyroidism, unspecified: Secondary | ICD-10-CM | POA: Diagnosis not present

## 2022-11-30 DIAGNOSIS — I10 Essential (primary) hypertension: Secondary | ICD-10-CM | POA: Diagnosis not present

## 2022-11-30 DIAGNOSIS — K219 Gastro-esophageal reflux disease without esophagitis: Secondary | ICD-10-CM | POA: Diagnosis not present

## 2022-11-30 DIAGNOSIS — Z170 Estrogen receptor positive status [ER+]: Secondary | ICD-10-CM | POA: Insufficient documentation

## 2022-11-30 DIAGNOSIS — N3281 Overactive bladder: Secondary | ICD-10-CM | POA: Diagnosis not present

## 2022-11-30 NOTE — Assessment & Plan Note (Addendum)
11/19/2022: Right lumpectomy: Grade 1 IDC 1.9 cm with intermediate grade DCIS, margins negative, ALH, sentinel lymph node: Benign fibroconnective tissue no lymph node identified.  ER 100%, PR 100%, HER2 negative, Ki-67 5%  Pathology counseling: I discussed the final pathology report of the patient provided  a copy of this report. I discussed the margins as well as lymph node surgeries. We also discussed the final staging along with the final biomarker results ER/PR and HER-2/neu testing. We also discussed that the axillary lymph node was not detected.  We will discuss her case in our tumor conference.  Given the favorable nature of her breast cancer, I do not suggest doing axillary lymph node dissection to get the lymph nodes.   Treatment plan: Oncotype testing to determine if she would benefit from chemo Adjuvant radiation Adjuvant antiestrogen therapy  Return to clinic based upon Oncotype DX test result

## 2022-12-01 ENCOUNTER — Encounter: Payer: Self-pay | Admitting: *Deleted

## 2022-12-02 DIAGNOSIS — C50311 Malignant neoplasm of lower-inner quadrant of right female breast: Secondary | ICD-10-CM | POA: Diagnosis not present

## 2022-12-02 DIAGNOSIS — Z170 Estrogen receptor positive status [ER+]: Secondary | ICD-10-CM | POA: Diagnosis not present

## 2022-12-04 IMAGING — RF DG ESOPHAGUS
9 of 11 series · 14 of 24 positions shown · non-contrast
Comparison: NONE.

CLINICAL DATA: Dysphagia. Patient describes solid food getting
stuck in mid chest. History of dilitation.

EXAM:
ESOPHAGUS/BARIUM SWALLOW/TABLET STUDY
TECHNIQUE: Single contrast examination was performed using thin liquid barium.
This exam was performed by Maira Schutte, and was supervised and
interpreted by Concepcion, Kenji.
FLUOROSCOPY TIME:  Radiation Exposure Index (as provided by the
fluoroscopic device): 58.45 mGy

[Series 1: sequence · 2 of 35 frames shown (1 of 8)]
[frame 1/35]
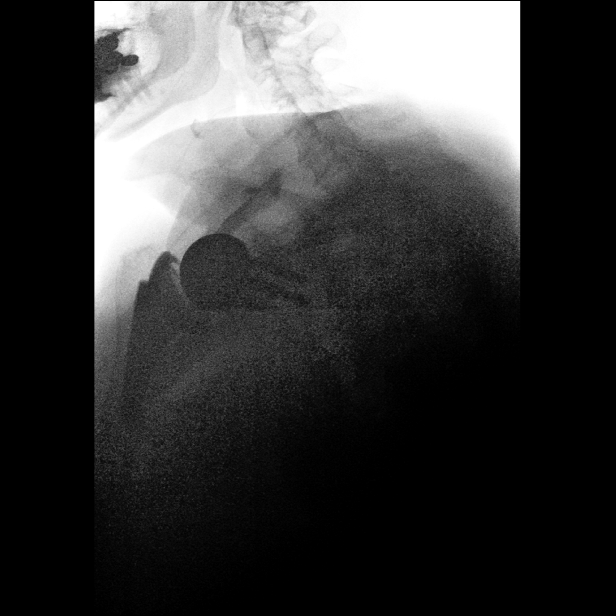
[frame 30/35]
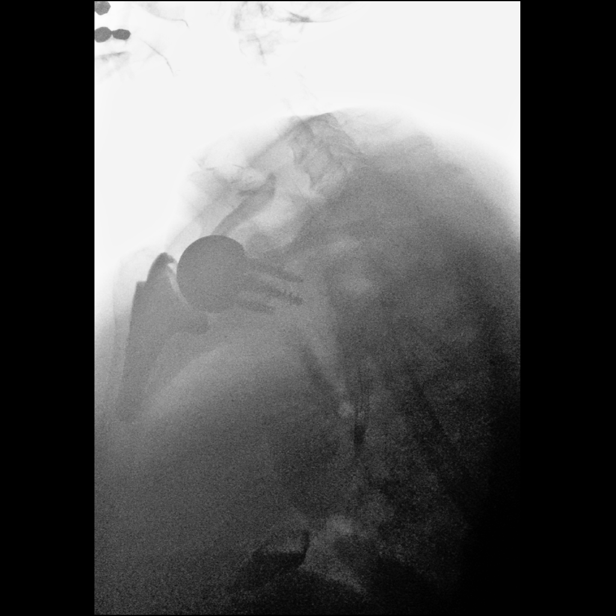

[Series 2: sequence · 1 of 41 frames shown (2 of 8)]
[frame 21/41]
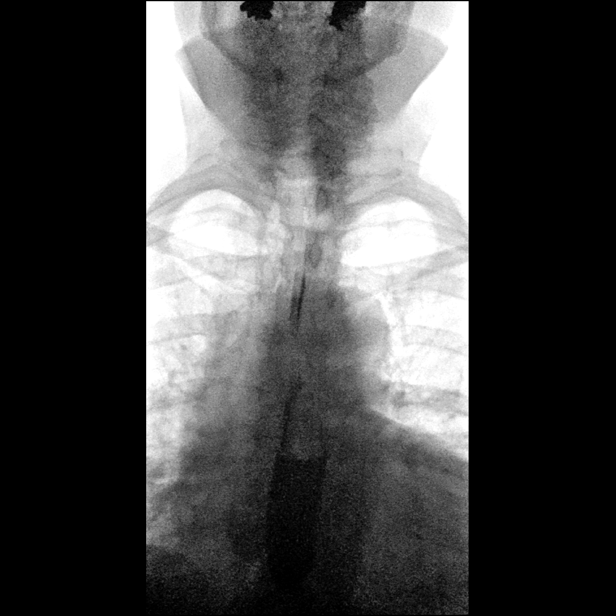

[Series 3: sequence · 0.31mm/px · 2 of 4 frames shown (3 of 8)]
[frame 1/4]
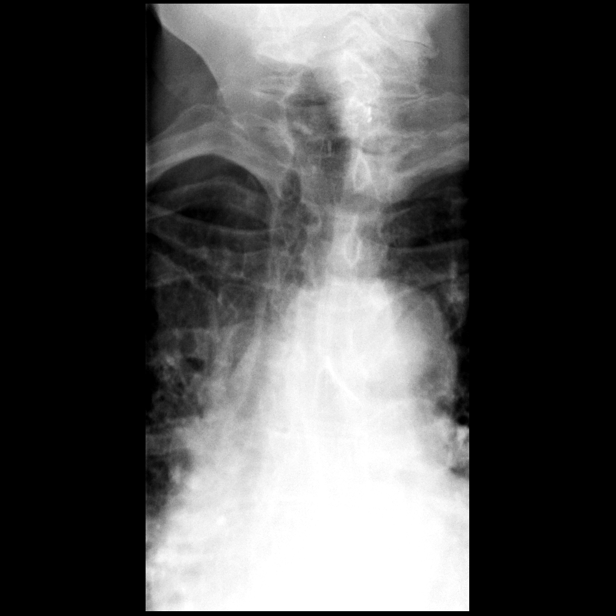
[frame 3/4]
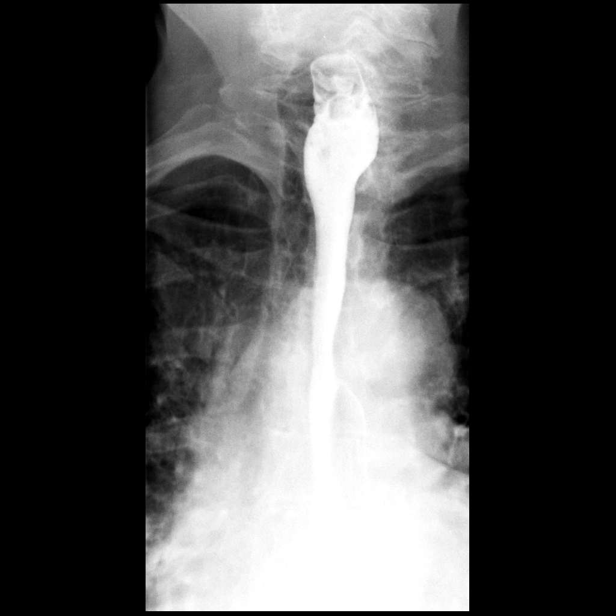

[Series 4: sequence · 0.31mm/px · 1 of 2 frames shown (4 of 8)]
[frame 2/2]
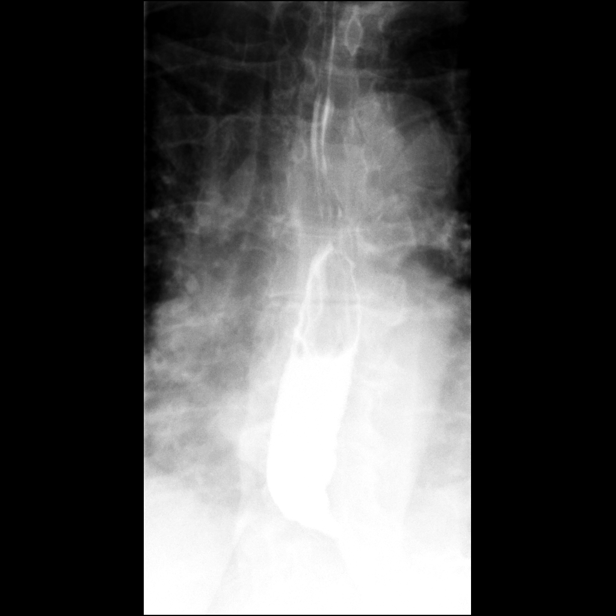

[Series 6: sequence · 0.31mm/px · 2 of 20 frames shown (5 of 8)]
[frame 4/20]
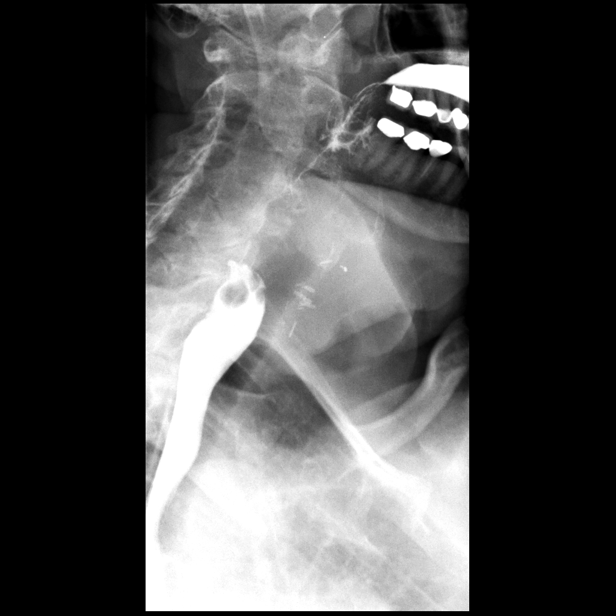
[frame 11/20]
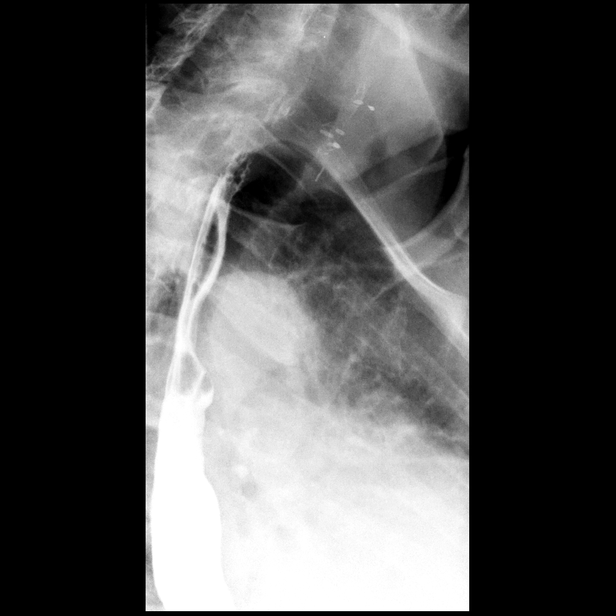

[Series 8: sequence · 0.31mm/px · 1 of 2 frames shown (6 of 8)]
[frame 1/2]
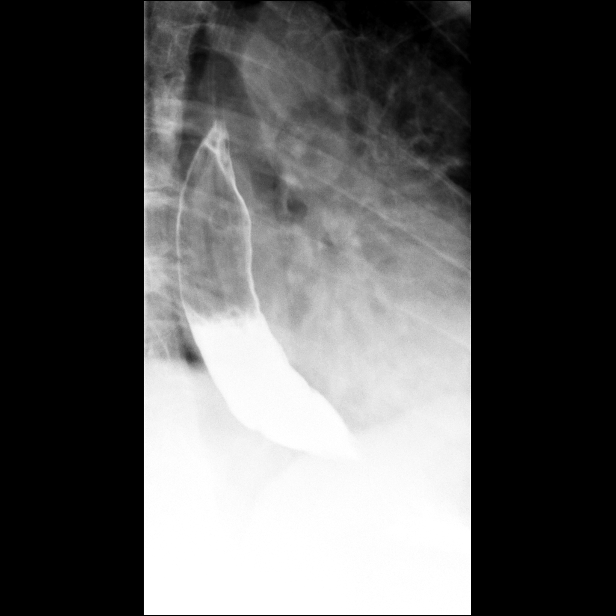

[Series 10: sequence · 2 of 38 frames shown (7 of 8)]
[frame 6/38]
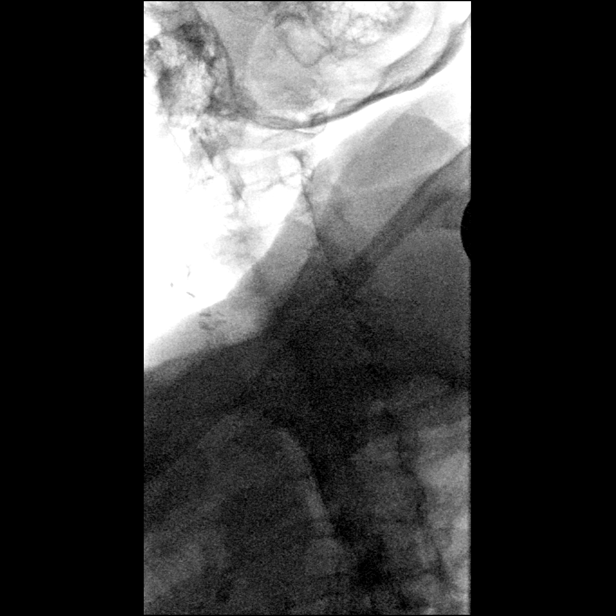
[frame 20/38]
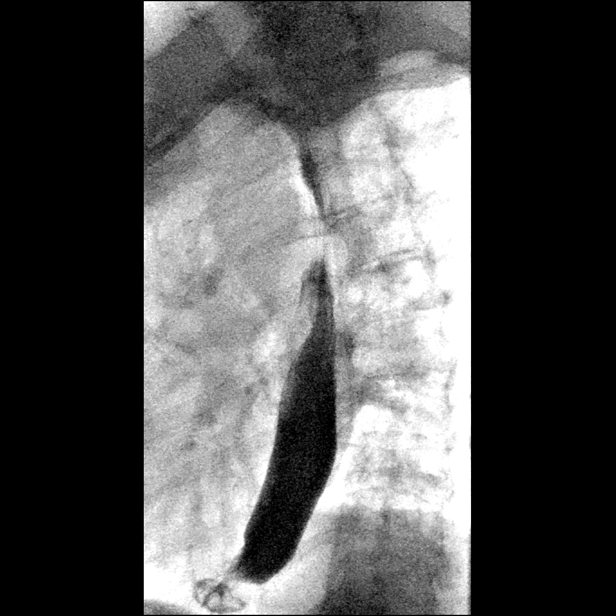

[Series 11: one shot · 2 of 3 slices shown]
[im 1/3]
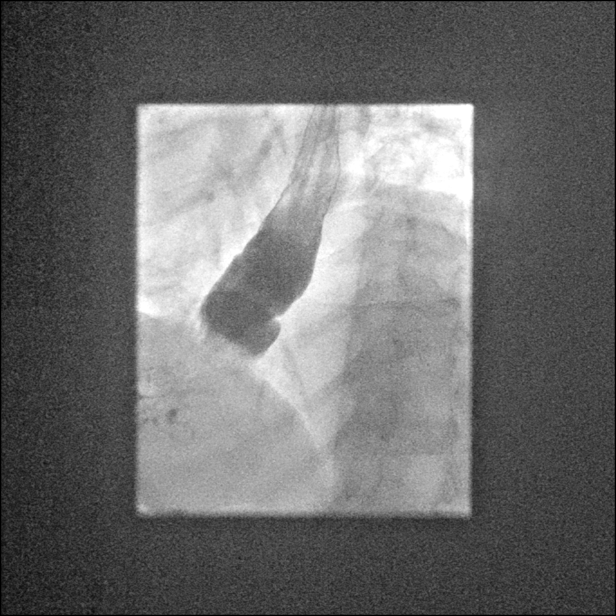
[im 3/3]
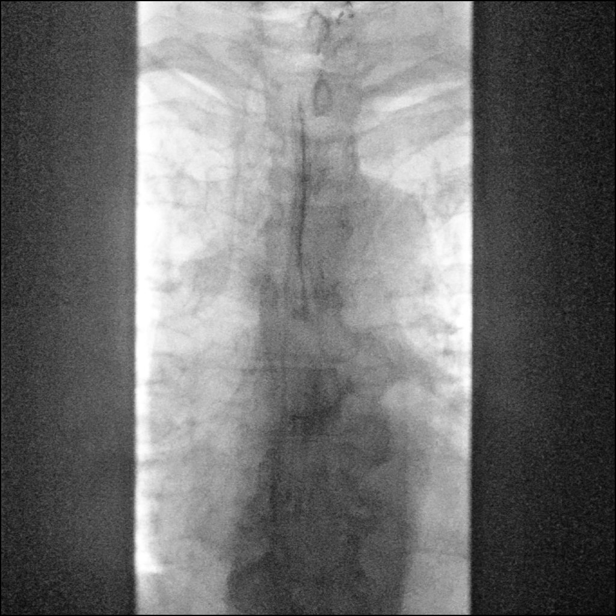

[Series 12: sequence · 1 of 6 frames shown (8 of 8)]
[frame 6/6]
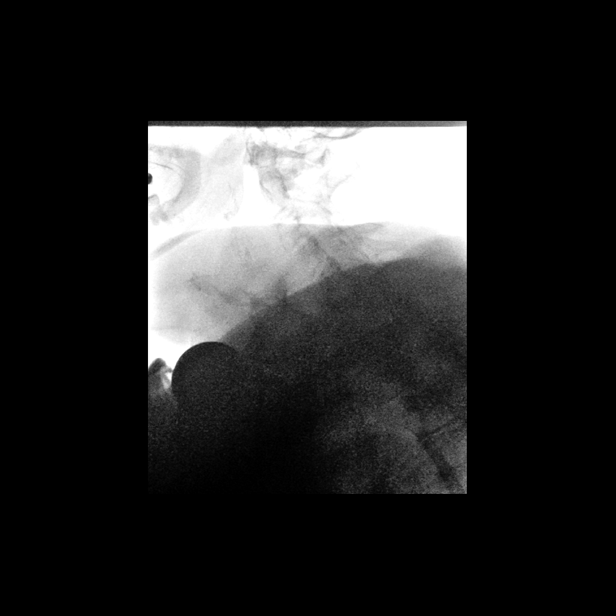

[14 of 24 positions shown; findings below may reference images not displayed]

FINDINGS: Swallowing: Appears normal. No vestibular penetration or aspiration
seen.

Pharynx: Prominent cricopharyngeus at C4-C5, non-obstructive to
liquid or tablet.

Esophagus: Normal appearance.

Esophageal motility: Within normal limits.

Hiatal Hernia: Small, sliding-type hiatal hernia.

Gastroesophageal reflux: Occurred to the level of the upper
esophagus.

Ingested 13mm barium tablet: Passed normally

Other: None.
IMPRESSION: 1.  Hypertrophic cricopharyngeus muscle, non-obstructive.

2.  Small, sliding-type hiatal hernia

3. Gastroesophageal reflux occurred to the level of the proximal
esophagus.

## 2022-12-08 DIAGNOSIS — E039 Hypothyroidism, unspecified: Secondary | ICD-10-CM | POA: Diagnosis not present

## 2022-12-08 DIAGNOSIS — F4310 Post-traumatic stress disorder, unspecified: Secondary | ICD-10-CM | POA: Diagnosis not present

## 2022-12-09 ENCOUNTER — Encounter: Payer: Self-pay | Admitting: *Deleted

## 2022-12-09 ENCOUNTER — Telehealth: Payer: Self-pay | Admitting: *Deleted

## 2022-12-09 DIAGNOSIS — Z170 Estrogen receptor positive status [ER+]: Secondary | ICD-10-CM

## 2022-12-09 NOTE — Telephone Encounter (Signed)
Received oncotype results 15/4%. Spoke to patient and she is aware. Referral placed for [facility].

## 2022-12-13 ENCOUNTER — Encounter: Payer: Self-pay | Admitting: Physical Therapy

## 2022-12-13 ENCOUNTER — Other Ambulatory Visit: Payer: Self-pay | Admitting: *Deleted

## 2022-12-13 ENCOUNTER — Encounter: Payer: Self-pay | Admitting: *Deleted

## 2022-12-13 ENCOUNTER — Ambulatory Visit: Payer: PPO | Attending: General Surgery | Admitting: Physical Therapy

## 2022-12-13 DIAGNOSIS — C50311 Malignant neoplasm of lower-inner quadrant of right female breast: Secondary | ICD-10-CM | POA: Diagnosis not present

## 2022-12-13 DIAGNOSIS — R293 Abnormal posture: Secondary | ICD-10-CM | POA: Diagnosis not present

## 2022-12-13 DIAGNOSIS — Z170 Estrogen receptor positive status [ER+]: Secondary | ICD-10-CM

## 2022-12-13 DIAGNOSIS — M25611 Stiffness of right shoulder, not elsewhere classified: Secondary | ICD-10-CM | POA: Insufficient documentation

## 2022-12-13 DIAGNOSIS — Z483 Aftercare following surgery for neoplasm: Secondary | ICD-10-CM | POA: Diagnosis not present

## 2022-12-13 DIAGNOSIS — R32 Unspecified urinary incontinence: Secondary | ICD-10-CM

## 2022-12-13 DIAGNOSIS — M25511 Pain in right shoulder: Secondary | ICD-10-CM

## 2022-12-13 NOTE — Patient Instructions (Signed)
[facility]  [address]  [place] [address]  [phone]  After Breast Cancer Class It is recommended you attend the ABC class to be educated on lymphedema risk reduction. This class is free of charge and lasts for 1 hour. It is a 1-time class. You will need to download the TEAMS app either on your phone or computer. We will send you a link the night before or the morning of the class. You should be able to click on that link to join the class. This is not a confidential class. You don't have to turn your camera on, but other participants may be able to see your email address. You are scheduled for May 20th at 12:00.  Scar massage You can begin gentle scar massage to you incision site in your armpit. Don't do it to the breast incision until it's healed. Gently place one hand on the incision and move the skin (without sliding on the skin) in various directions. Do this for a few minutes and then you can gently massage either coconut oil or vitamin E cream into the scars.  Compression garment You should continue wearing your compression bra until you feel like you no longer have swelling.  Home exercise Program Continue doing the exercises you were given until you feel like you can do them without feeling any tightness at the end. Do the first and last one on the paper laying flat on your back.  Walking Program Studies show that 30 minutes of walking per day (fast enough to elevate your heart rate) can significantly reduce the risk of a cancer recurrence. If you can't walk due to other medical reasons, we encourage you to find another activity you could do (like a stationary bike or water exercise).  Posture After breast cancer surgery, people frequently sit with rounded shoulders posture because it puts their incisions on slack and feels better. If you sit like this and scar tissue forms in that position, you can become very tight and have pain sitting or  standing with good posture. Try to be aware of your posture and sit and stand up tall to heal properly.  Follow up PT: It is recommended you return every 3 months for the first 3 years following surgery to be assessed on the SOZO machine for an L-Dex score. This helps prevent clinically significant lymphedema in 95% of patients. These follow up screens are 10 minute appointments that you are not billed for.

## 2022-12-13 NOTE — Therapy (Signed)
OUTPATIENT PHYSICAL THERAPY BREAST CANCER POST OP FOLLOW UP   Patient Name: [name] [name] MRN: [id number] DOB:[birth year], 72 y.o., female Today's Date: 12/13/2022  END OF SESSION:  PT End of Session - 12/13/22 1208     Visit Number 2    Number of Visits 2    PT Start Time 1205    PT Stop Time 1310    PT Time Calculation (min) 65 min    Activity Tolerance Patient tolerated treatment well    Behavior During Therapy WFL for tasks assessed/performed             Past Medical History:  Diagnosis Date   ADHD (attention deficit hyperactivity disorder)    Anemia    Anxiety    Arthritis    Depression    GERD (gastroesophageal reflux disease)    History of panic attacks    Hyperlipidemia    Hypertension    Hypothyroidism, postsurgical    Pre-diabetes    SBO (small bowel obstruction) (HCC) 01/15/2020   Sleep apnea    SUI (stress urinary incontinence, female)    Wears glasses    Past Surgical History:  Procedure Laterality Date   BREAST BIOPSY Right 10/07/2022   BREAST BIOPSY Right 10/07/2022   [place] RT BREAST BX W LOC DEV 1ST LESION IMG BX SPEC US GUIDE 10/07/2022 [facility]   BREAST BIOPSY  11/18/2022   MM RT RADIOACTIVE SEED LOC MAMMO GUIDE 11/18/2022 [facility]   BREAST LUMPECTOMY WITH RADIOACTIVE SEED AND SENTINEL LYMPH NODE BIOPSY Right 11/19/2022   Procedure: RIGHT BREAST LUMPECTOMY WITH RADIOACTIVE SEED;  Surgeon: [name], MD;  Location: [facility] OR;  Service: General;  Laterality: Right;   CATARACT EXTRACTION Bilateral    2023   D & C HYSTEROSCOPY W/ RESECTION ENDOMETRIAL POLYP  04/15/2009   D & C HYSTEROSCOPY W/ RESECTOSCOPE AND THERMACHOICE ENDOMETRIAL ABLATION  12/09/2010   EXCISION MELANOMA WITH SENTINEL LYMPH NODE BIOPSY Right 11/19/2022   Procedure: SENTINEL LYMPH NODE BIOPSY;  Surgeon: [name], MD;  Location: [facility] OR;  Service: General;  Laterality: Right;   LAPAROSCOPIC ASSISTED VENTRAL HERNIA REPAIR Left 01/16/2020   Procedure:  LAPAROSCOPIC ASSISTED HERNIA REPAIR;  Surgeon: Berna Bue, MD;  Location: [facility] OR;  Service: General;  Laterality: Left;   PUBOVAGINAL SLING N/A 07/26/2016   Procedure: PUBO-VAGINAL SLING ALTIS SLING;  Surgeon: Jethro Bolus, MD;  Location: [facility];  Service: Urology;  Laterality: N/A;   REPAIR SPIGELIAN HERNIA     RESECTION GIANT CELL TUMOR RIGHT LONG FINGER  09/13/2008   REVERSE SHOULDER ARTHROPLASTY Right 07/16/2021   Procedure: REVERSE SHOULDER ARTHROPLASTY;  Surgeon: Jones Broom, MD;  Location: [facility] ORS;  Service: Orthopedics;  Laterality: Right;   ROTATOR CUFF REPAIR Bilateral    THYROIDECTOMY  1979    [facility]   non-malignant diseased thyroid   THYROIDECTOMY Left 03/17/2021   Procedure: COMPLETION THYROIDECTOMY, LEFT LOBE;  Surgeon: Darnell Level, MD;  Location: [facility];  Service: General;  Laterality: Left;   TOTAL KNEE ARTHROPLASTY Right 12/17/2019   Procedure: RIGHT TOTAL KNEE ARTHROPLASTY;  Surgeon: Gean Birchwood, MD;  Location: [facility];  Service: Orthopedics;  Laterality: Right;   TOTAL KNEE ARTHROPLASTY Left 03/03/2020   Procedure: LEFT TOTAL KNEE ARTHROPLASTY;  Surgeon: Gean Birchwood, MD;  Location: [facility];  Service: Orthopedics;  Laterality: Left;   TRIPLE ARTHRODESIS LEFT FOOT  04/20/2007   VENTRAL HERNIA REPAIR  2003   WRIST GANGLION EXCISION Right 1975   Patient Active Problem List  Diagnosis Date Noted   Genetic testing 11/01/2022   Family history of breast cancer 10/21/2022   Malignant neoplasm of lower-inner quadrant of right breast of female, estrogen receptor positive (HCC) 10/18/2022   Neoplasm of uncertain behavior of thyroid gland 03/15/2021   Left thyroid nodule 03/15/2021   S/P TKR (total knee replacement) using cement, left 03/03/2020   Degenerative arthritis of left knee 02/29/2020   SBO (small bowel obstruction) (HCC) 01/15/2020   S/P TKR (total knee replacement), right 12/19/2019   Total knee replacement status, right 12/17/2019    Osteoarthritis of right knee 12/14/2019   Obesity, morbid (HCC) 12/16/2016   Strain of iliopsoas muscle, initial encounter 07/15/2016   Elevated cholesterol    Urinary incontinence    Endometrial polyp    Primary osteoarthritis of knees, bilateral 05/15/2009   DERANGEMENT OF ANTERIOR HORN OF LATERAL MENISCUS 05/15/2009   KNEE PAIN 05/15/2009    REFERRING PROVIDER: Dr. [name]  REFERRING DIAG: Right breast cancer  THERAPY DIAG:  Abnormal posture  Stiffness of right shoulder, not elsewhere classified  Aftercare following surgery for neoplasm  Malignant neoplasm of lower-inner quadrant of right breast of female, estrogen receptor positive (HCC)  Rationale for Evaluation and Treatment: Rehabilitation  ONSET DATE: 11/19/2022  SUBJECTIVE:                                                                                                                                                                                           SUBJECTIVE STATEMENT: Patient reports she underwent a right lumpectomy and sentinel node biopsy on 11/19/2022. The pathology showed the sentinel node biopsy did not contain any lymph node tissue and the tissue was benign. Her final pathology differed from her original biopsy results which showed it to be ER/PR positive and HER2 negative with a Ki67 of 2%. Initially her biopsy stated she was triple positive. Her Oncotype score is low but she will proceed to radiation and anti-estrogen therapy. She was diagnosed with an infection in her right breast and has been on antibiotics which ended 12/12/2022. She reports right shoulder dysfunction since her shoulder replacement 07/16/2021.  PERTINENT HISTORY:  Patient was diagnosed on 09/21/2022 with right grade 2 invasive ductal carcinoma breast cancer. She underwent a right lumpectomy and sentinel node biopsy on 11/19/2022 but there as not nodal tissue in the sentinel node biopsy. Final pathology was ER/PR positive and HER2  negative with a Ki67 of 2%. Hx left rotator cuff repair 08/05/20. Pins placed in left foot 2003. Hx of right shoulder replacement 07/16/2021.   PATIENT GOALS:  Reassess how my recovery is going related to arm function, pain, and swelling.  PAIN:  Are you having pain? No  PRECAUTIONS: Recent Surgery, right UE Lymphedema risk, Other: previous right shoulder replacement 07/16/2021  ACTIVITY LEVEL / LEISURE: Not currently exercising but wants to get back to water exercise when her incision heals.   OBJECTIVE:   PATIENT SURVEYS:  QUICK DASH:  Quick Dash - 12/13/22 0001     Open a tight or new jar Mild difficulty    Do heavy household chores (wash walls, wash floors) Severe difficulty    Carry a shopping bag or briefcase No difficulty    Wash your back Moderate difficulty    Use a knife to cut food No difficulty    Recreational activities in which you take some force or impact through your arm, shoulder, or hand (golf, hammering, tennis) Unable    During the past week, to what extent has your arm, shoulder or hand problem interfered with your normal social activities with family, friends, neighbors, or groups? Slightly    During the past week, to what extent has your arm, shoulder or hand problem limited your work or other regular daily activities Slightly    Arm, shoulder, or hand pain. Mild    Tingling (pins and needles) in your arm, shoulder, or hand Moderate    Difficulty Sleeping No difficulty    DASH Score 34.09 %              OBSERVATIONS: Right axillary incision and breast incisions are healing well. There is a slightly open area on her right breast incision (see photo in media). Messaged MD to make sure she does not need another antibiotics. No significant edema present in her breast. Right shoulder with significant compensatory techniques noted with flexion and abduction. Poor scapular mobility and shoulder hiking present.  POSTURE:  Forward head and rounded shoulders  posture  LYMPHEDEMA ASSESSMENT:   UPPER EXTREMITY AROM/PROM:   A/PROM RIGHT   eval   RIGHT 12/13/2022  Shoulder extension 41 48  Shoulder flexion 81 75  Shoulder abduction 60 63  Shoulder internal rotation NT due to limited abduction NT due to limited abduction  Shoulder external rotation NT due to limited abduction NT due to limited abduction                          (Blank rows = not tested)   A/PROM LEFT   eval  Shoulder extension 52  Shoulder flexion 131  Shoulder abduction 159  Shoulder internal rotation 70  Shoulder external rotation 80                          (Blank rows = not tested)   CERVICAL AROM:     Percent limited at eval 10/20/2022 12/13/2022  Flexion WNL WNL  Extension 75% limited 75% limited  Right lateral flexion 75% limited 50% limited  Left lateral flexion 75% limited 50% limited  Right rotation 50% limited  50% limited  Left rotation 50% limited 50% limited      UPPER EXTREMITY STRENGTH: NT   LYMPHEDEMA ASSESSMENTS:    LANDMARK RIGHT   eval RIGHT 12/13/2022  10 cm proximal to olecranon process 41.3 43.2  Olecranon process 30.8 31.5  10 cm proximal to ulnar styloid process 25.6 25.1  Just proximal to ulnar styloid process 15.9 15.6  Across hand at thumb web space 19 19.5  At base of 2nd digit 5.8 5.8  (Blank rows = not tested)   [facility]  LEFT   eval LEFT 12/13/2022  10 cm proximal to olecranon process 40.8 41.3  Olecranon process 30.2 29  10  cm proximal to ulnar styloid process 23.1 24.2  Just proximal to ulnar styloid process 15 15.4  Across hand at thumb web space 18.6 18.8  At base of 2nd digit 5.6 5.8  (Blank rows = not tested)    Surgery type/Date: 11/19/2022 Right lumpectomy and attempted sentinel node biopsy but no nodal tissue identified Number of lymph nodes removed: 0 Current/past treatment (chemo, radiation, hormone therapy): none Other symptoms:  Heaviness/tightness No Pain No Pitting edema No Infections Yes Decreased  scar mobility Yes Stemmer sign No  PATIENT EDUCATION:  Education details: Reviewed HEP Person educated: Patient Education method: Explanation, Demonstration, and Verbal cues Education comprehension: verbalized understanding and returned demonstration  HOME EXERCISE PROGRAM: Reviewed previously given post op HEP.  ASSESSMENT:  CLINICAL IMPRESSION: Patient is doing well s/p her lumpectomy but has concerns about other medical issues including her right shoulder ROM and function and bladder control issues. She is back to baseline since surgery related to her shoulder and breast cancer. Her breast incision does not appear to be infected to me but I sent a photo to her surgeon to be sure that doesn't need further attention. Her right shoulder is still very limited functionally but this is due to her previous shoulder replacement. She is agreeable to trying PT for her shoulder but knows we will stop after 2-3 weeks if there is no progress. She will also benefit from the ABC class for lymphedema education and from a pelvic floor PT eval for bladder leakage complaints.  Pt will benefit from skilled therapeutic intervention to improve on the following deficits: Decreased knowledge of precautions, impaired UE functional use, pain, decreased ROM, postural dysfunction.   PT treatment/interventions: ADL/Self care home management, Therapeutic exercises, Therapeutic activity, Patient/Family education, Self Care, and Re-evaluation   GOALS: Goals reviewed with patient? Yes  LONG TERM GOALS:  (STG=LTG)  GOALS Name Target Date  Goal status  1 Pt will demonstrate she has regained full shoulder ROM and function post operatively compared to baselines.  Baseline: 12/26/2022 MET  2 Patient will improve shoulder flexion to be able to reach into a shoulder height cabinet. 01/31/2023 (This is 4 weeks from beginning of treatment for shoulder issue) INITIAL  3 Patient will improve shoulder abduction to reach out to  the side out of her car window. 01/31/2023 (This is 4 weeks from beginning of treatment for shoulder issue) INITIAL  4 Patient will improve her DASH score to be </= 15 for improved overall function. 01/31/2023 (This is 4 weeks from beginning of treatment for shoulder issue) INITIAL     PLAN:  PT FREQUENCY/DURATION: 2x/week for 4 weeks beginning 01/03/2023  PLAN FOR NEXT SESSION: Messaged with Dr. Dwain Sarna to determine the risk of lymphedema since her sentinel node biopsy did not have nodal tissue. He recommended surveillance for lymphedema as the tracer did trace to the axilla. We will continue with SOZO every 3 months. Will begin ortho PT for right shoulder in a few weeks to see if that can improve and will eval for pelvic floor weakness in August.   [facility]  [address], [address]  [place] [address]  [phone]  After Breast Cancer Class It is recommended you attend the ABC class to be educated on lymphedema risk reduction. This class is free of charge and lasts for 1 hour. It is a 1-time class.  You will need to download the TEAMS app either on your phone or computer. We will send you a link the night before or the morning of the class. You should be able to click on that link to join the class. This is not a confidential class. You don't have to turn your camera on, but other participants may be able to see your email address. You are scheduled for May 20th at 12:00.  Scar massage You can begin gentle scar massage to you incision site in your armpit. Don't do it to the breast incision until it's healed. Gently place one hand on the incision and move the skin (without sliding on the skin) in various directions. Do this for a few minutes and then you can gently massage either coconut oil or vitamin E cream into the scars.  Compression garment You should continue wearing your compression bra until you feel like you no longer have swelling.  Home exercise  Program Continue doing the exercises you were given until you feel like you can do them without feeling any tightness at the end. Do the first and last one on the paper laying flat on your back.  Walking Program Studies show that 30 minutes of walking per day (fast enough to elevate your heart rate) can significantly reduce the risk of a cancer recurrence. If you can't walk due to other medical reasons, we encourage you to find another activity you could do (like a stationary bike or water exercise).  Posture After breast cancer surgery, people frequently sit with rounded shoulders posture because it puts their incisions on slack and feels better. If you sit like this and scar tissue forms in that position, you can become very tight and have pain sitting or standing with good posture. Try to be aware of your posture and sit and stand up tall to heal properly.  Follow up PT: It is recommended you return every 3 months for the first 3 years following surgery to be assessed on the SOZO machine for an L-Dex score. This helps prevent clinically significant lymphedema in 95% of patients. These follow up screens are 10 minute appointments that you are not billed for.  Bethann Punches, [place] 12/13/22 2:05 PM

## 2022-12-14 ENCOUNTER — Encounter: Payer: Self-pay | Admitting: Physical Therapy

## 2022-12-14 DIAGNOSIS — G4733 Obstructive sleep apnea (adult) (pediatric): Secondary | ICD-10-CM | POA: Diagnosis not present

## 2022-12-15 DIAGNOSIS — C50311 Malignant neoplasm of lower-inner quadrant of right female breast: Secondary | ICD-10-CM | POA: Diagnosis not present

## 2022-12-15 DIAGNOSIS — Z170 Estrogen receptor positive status [ER+]: Secondary | ICD-10-CM | POA: Diagnosis not present

## 2022-12-16 ENCOUNTER — Ambulatory Visit
Admission: RE | Admit: 2022-12-16 | Discharge: 2022-12-16 | Disposition: A | Discharge status: Home / Self Care | Payer: PPO | Source: Ambulatory Visit | Attending: Radiation Oncology | Admitting: Radiation Oncology

## 2022-12-16 ENCOUNTER — Encounter: Payer: Self-pay | Admitting: Radiation Oncology

## 2022-12-16 VITALS — BP 146/65 | HR 72 | Temp 96.8°F | Resp 18 | Ht 63.0 in | Wt 289.2 lb

## 2022-12-16 DIAGNOSIS — Z79899 Other long term (current) drug therapy: Secondary | ICD-10-CM | POA: Diagnosis not present

## 2022-12-16 DIAGNOSIS — E039 Hypothyroidism, unspecified: Secondary | ICD-10-CM | POA: Insufficient documentation

## 2022-12-16 DIAGNOSIS — I10 Essential (primary) hypertension: Secondary | ICD-10-CM | POA: Insufficient documentation

## 2022-12-16 DIAGNOSIS — C50311 Malignant neoplasm of lower-inner quadrant of right female breast: Secondary | ICD-10-CM | POA: Insufficient documentation

## 2022-12-16 DIAGNOSIS — F909 Attention-deficit hyperactivity disorder, unspecified type: Secondary | ICD-10-CM | POA: Diagnosis not present

## 2022-12-16 DIAGNOSIS — Z510 Encounter for antineoplastic radiation therapy: Secondary | ICD-10-CM | POA: Insufficient documentation

## 2022-12-16 DIAGNOSIS — E785 Hyperlipidemia, unspecified: Secondary | ICD-10-CM | POA: Insufficient documentation

## 2022-12-16 DIAGNOSIS — Z87891 Personal history of nicotine dependence: Secondary | ICD-10-CM | POA: Diagnosis not present

## 2022-12-16 DIAGNOSIS — Z170 Estrogen receptor positive status [ER+]: Secondary | ICD-10-CM | POA: Insufficient documentation

## 2022-12-16 DIAGNOSIS — Z803 Family history of malignant neoplasm of breast: Secondary | ICD-10-CM | POA: Insufficient documentation

## 2022-12-16 DIAGNOSIS — G4730 Sleep apnea, unspecified: Secondary | ICD-10-CM | POA: Insufficient documentation

## 2022-12-16 DIAGNOSIS — Z79890 Hormone replacement therapy (postmenopausal): Secondary | ICD-10-CM | POA: Insufficient documentation

## 2022-12-16 DIAGNOSIS — K219 Gastro-esophageal reflux disease without esophagitis: Secondary | ICD-10-CM | POA: Insufficient documentation

## 2022-12-16 NOTE — Progress Notes (Signed)
Nursing interview for a 72 yr old female w/ Malignant neoplasm of lower-inner quadrant of right breast of female, estrogen receptor positive (HCC).  Patient identity verified x2.  Patient reports itching/ tenderness of the RT breast at incision  line w/o pain. No other issues reported at this time.  Meaningful use complete. Postmenopausal  Vitals- BP (!) 146/65 (BP Location: Left Arm, Patient Position: Sitting, Cuff Size: Large)   Pulse 72   Temp (!) 96.8 F (36 C) (Temporal)   Resp 18   Ht 5\' 3"  (1.6 m)   Wt 289 lb 4 oz (131.2 kg)   SpO2 98%   BMI 51.24 kg/m    This concludes the interview.   Ruel Favors, LPN

## 2022-12-16 NOTE — Progress Notes (Signed)
Radiation Oncology         [phone] ________________________________  Name: [name] [name]        MRN: [id number]  Date of Service: 12/16/2022 DOB: 22-Jul-1951  WU:[id number], Ann Maki, PA  Serena Croissant, MD     REFERRING PHYSICIAN: Serena Croissant, MD   DIAGNOSIS: The encounter diagnosis was Malignant neoplasm of lower-inner quadrant of right breast of female, estrogen receptor positive (HCC).   HISTORY OF PRESENT ILLNESS: [name] [name] is a 72 y.o. female originally seen in the multidisciplinary breast clinic for a new diagnosis of right breast cancer. The patient was noted to have a screening detected mass in the right breast.  Further diagnostic imaging identified this in the 4 o'clock position.  By ultrasound this measured 8 mm in greatest dimension in the 4 o'clock position.  Mild internal blood flow was identified.  Her right axillary lymph nodes were normal in appearance.  She underwent a biopsy on 10/07/2022 that showed grade 2 invasive ductal carcinoma that was ER/PR positive, HER2/neu amplified with a Ki-67 of 5%.   Since her last visit, she underwent a right lumpectomy and sentinel node biopsy with Dr. Dwain Sarna on 11/19/22. This revealed a 1.9 cm grade 1 invasive ductal carcinoma with associated intermediate grade DCIS. Margins were negative, the closest, the anterior margin at 5 mm. The sentinel specimen was negative for carcinoma but no lymphatic tissue was noted. Her surgical specimen was retested for prognostics and this showed that the tumor was ER/PR positive, but now HER2 was NEGATIVE, and Ki 67 was 2%. She has an Oncotype Dx score of 15, and no chemotherapy is recommended. She's seen today to discuss radiotherapy.    PREVIOUS RADIATION THERAPY: No   PAST MEDICAL HISTORY:  Past Medical History:  Diagnosis Date   ADHD (attention deficit hyperactivity disorder)    Anemia    Anxiety    Arthritis    Depression    GERD (gastroesophageal reflux disease)    History of panic  attacks    Hyperlipidemia    Hypertension    Hypothyroidism, postsurgical    Pre-diabetes    SBO (small bowel obstruction) (HCC) 01/15/2020   Sleep apnea    SUI (stress urinary incontinence, female)    Wears glasses        PAST SURGICAL HISTORY: Past Surgical History:  Procedure Laterality Date   BREAST BIOPSY Right 10/07/2022   BREAST BIOPSY Right 10/07/2022   [place] RT BREAST BX W LOC DEV 1ST LESION IMG BX SPEC US GUIDE 10/07/2022 [facility]   BREAST BIOPSY  11/18/2022   MM RT RADIOACTIVE SEED LOC MAMMO GUIDE 11/18/2022 [facility]   BREAST LUMPECTOMY WITH RADIOACTIVE SEED AND SENTINEL LYMPH NODE BIOPSY Right 11/19/2022   Procedure: RIGHT BREAST LUMPECTOMY WITH RADIOACTIVE SEED;  Surgeon: [name], MD;  Location: [facility] OR;  Service: General;  Laterality: Right;   CATARACT EXTRACTION Bilateral    2023   D & C HYSTEROSCOPY W/ RESECTION ENDOMETRIAL POLYP  04/15/2009   D & C HYSTEROSCOPY W/ RESECTOSCOPE AND THERMACHOICE ENDOMETRIAL ABLATION  12/09/2010   EXCISION MELANOMA WITH SENTINEL LYMPH NODE BIOPSY Right 11/19/2022   Procedure: SENTINEL LYMPH NODE BIOPSY;  Surgeon: [name], MD;  Location: [facility] OR;  Service: General;  Laterality: Right;   LAPAROSCOPIC ASSISTED VENTRAL HERNIA REPAIR Left 01/16/2020   Procedure: LAPAROSCOPIC ASSISTED HERNIA REPAIR;  Surgeon: Berna Bue, MD;  Location: [facility] OR;  Service: General;  Laterality: Left;   PUBOVAGINAL SLING N/A 07/26/2016  Procedure: PUBO-VAGINAL SLING ALTIS SLING;  Surgeon: Jethro Bolus, MD;  Location: [facility];  Service: Urology;  Laterality: N/A;   REPAIR SPIGELIAN HERNIA     RESECTION GIANT CELL TUMOR RIGHT LONG FINGER  09/13/2008   REVERSE SHOULDER ARTHROPLASTY Right 07/16/2021   Procedure: REVERSE SHOULDER ARTHROPLASTY;  Surgeon: Jones Broom, MD;  Location: [facility] ORS;  Service: Orthopedics;  Laterality: Right;   ROTATOR CUFF REPAIR Bilateral    THYROIDECTOMY  1979    [facility]    non-malignant diseased thyroid   THYROIDECTOMY Left 03/17/2021   Procedure: COMPLETION THYROIDECTOMY, LEFT LOBE;  Surgeon: Darnell Level, MD;  Location: [facility];  Service: General;  Laterality: Left;   TOTAL KNEE ARTHROPLASTY Right 12/17/2019   Procedure: RIGHT TOTAL KNEE ARTHROPLASTY;  Surgeon: Gean Birchwood, MD;  Location: [facility];  Service: Orthopedics;  Laterality: Right;   TOTAL KNEE ARTHROPLASTY Left 03/03/2020   Procedure: LEFT TOTAL KNEE ARTHROPLASTY;  Surgeon: Gean Birchwood, MD;  Location: [facility];  Service: Orthopedics;  Laterality: Left;   TRIPLE ARTHRODESIS LEFT FOOT  04/20/2007   VENTRAL HERNIA REPAIR  2003   WRIST GANGLION EXCISION Right 1975     FAMILY HISTORY:  Family History  Problem Relation Age of Onset   Breast cancer Mother 46   Uterine cancer Mother    Breast cancer Sister 88   Diabetes Maternal Grandfather    Heart disease Paternal Grandfather      SOCIAL HISTORY:  reports that she quit smoking about 48 years ago. Her smoking use included cigarettes. She has never used smokeless tobacco. She reports that she does not currently use alcohol. She reports that she does not use drugs.  The patient is married and lives in [place].  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 She has twin grandchildren she enjoys spending time with.    ALLERGIES: Ceclor [cefaclor], Bactrim [sulfamethoxazole-trimethoprim], and Gabapentin   MEDICATIONS:  Current Outpatient Medications  Medication Sig Dispense Refill   ALPRAZolam (XANAX) 0.5 MG tablet Take 0.5 mg by mouth daily as needed for anxiety.     buPROPion (WELLBUTRIN XL) 150 MG 24 hr tablet Take 150 mg by mouth daily.     CALCIUM PO Take 4 tablets by mouth daily. Mini Calcium Total dose 1200 mg     Cholecalciferol (VITAMIN D3) 125 MCG (5000 UT) TABS Take 5,000 Units by mouth daily.     DULoxetine  (CYMBALTA) 30 MG capsule Take 30 mg by mouth daily.     DULoxetine (CYMBALTA) 60 MG capsule Take 60 mg by mouth in the morning.  2   ferrous sulfate 325 (65 FE) MG tablet Take 325 mg by mouth.     ibuprofen (ADVIL) 200 MG tablet Take 400-600 mg by mouth every 8 (eight) hours as needed for moderate pain.     irbesartan (AVAPRO) 75 MG tablet Take 75 mg by mouth in the morning.     lamoTRIgine (LAMICTAL) 25 MG tablet Take 25 mg by mouth 2 (two) times daily.     levothyroxine (SYNTHROID) 137 MCG tablet Take 137 mcg by mouth daily before breakfast.     lisdexamfetamine (VYVANSE) 50 MG capsule Take 50 mg by mouth in the morning.     MAGNESIUM GLYCINATE PO Take 400 mg by mouth daily.     Multiple Vitamin (MULTIVITAMIN) capsule Take 2 capsules by mouth daily.  MYRBETRIQ 50 MG TB24 tablet Take 50 mg by mouth in the morning.     omeprazole (PRILOSEC) 40 MG capsule Take 20 mg by mouth daily.     simvastatin (ZOCOR) 40 MG tablet Take 40 mg by mouth in the morning.  2   traMADol (ULTRAM) 50 MG tablet Take 1 tablet (50 mg total) by mouth every 6 (six) hours as needed. 10 tablet 0   vitamin k 100 MCG tablet Take 100 mcg by mouth daily.     No current facility-administered medications for this encounter.     REVIEW OF SYSTEMS: On review of systems, the patient reports that she is doing well. She will be working with PT regarding her range of motion given a prior shoulder replacement. She feels like she is healing well.      PHYSICAL EXAM:  Wt Readings from Last 3 Encounters:  11/30/22 290 lb (131.5 kg)  11/19/22 280 lb (127 kg)  11/10/22 284 lb 3.2 oz (128.9 kg)   Temp Readings from Last 3 Encounters:  11/30/22 (!) 97.5 F (36.4 C) (Temporal)  11/19/22 97.9 F (36.6 C)  11/10/22 98.2 F (36.8 C)   BP Readings from Last 3 Encounters:  11/30/22 (!) 140/61  11/19/22 (!) 107/54  11/10/22 (!) 142/68   Pulse Readings from Last 3 Encounters:  11/30/22 89  11/19/22 69  11/10/22 74    In  general this is a well appearing caucasian female in no acute distress. She's alert and oriented x4 and appropriate throughout the examination. Cardiopulmonary assessment is negative for acute distress and she exhibits normal effort. The right breast incision site is healing well, with minimal separation medially with fibrin. Her axillary site is well healed. Neither show erythema, or drainage.     ECOG = 1  0 - Asymptomatic (Fully active, able to carry on all predisease activities without restriction)  1 - Symptomatic but completely ambulatory (Restricted in physically strenuous activity but ambulatory and able to carry out work of a light or sedentary nature. For example, light housework, office work)  2 - Symptomatic, <50% in bed during the day (Ambulatory and capable of all self care but unable to carry out any work activities. Up and about more than 50% of waking hours)  3 - Symptomatic, >50% in bed, but not bedbound (Capable of only limited self-care, confined to bed or chair 50% or more of waking hours)  4 - Bedbound (Completely disabled. Cannot carry on any self-care. Totally confined to bed or chair)  5 - Death   Santiago Glad MM, Creech RH, Tormey DC, et al. [phone]). "Toxicity and response criteria of the [facility]". Am. Evlyn Clines. Oncol. 5 (6): 649-55    LABORATORY DATA:  Lab Results  Component Value Date   WBC 7.6 10/20/2022   HGB 12.5 10/20/2022   HCT 39.6 10/20/2022   MCV 86.7 10/20/2022   PLT 350 10/20/2022   Lab Results  Component Value Date   NA 141 10/20/2022   K 4.0 10/20/2022   CL 106 10/20/2022   CO2 28 10/20/2022   Lab Results  Component Value Date   ALT 14 10/20/2022   AST 15 10/20/2022   ALKPHOS 90 10/20/2022   BILITOT 0.4 10/20/2022      RADIOGRAPHY: MM Breast Surgical Specimen  Result Date: 11/19/2022 CLINICAL DATA:  Status post lumpectomy today after earlier radioactive seed localization. EXAM: SPECIMEN RADIOGRAPH OF THE RIGHT  BREAST COMPARISON:  Previous exam(s). FINDINGS: Status post excision of the  right breast. The radioactive seed and biopsy marker clip are present and appear completely intact within the specimen. IMPRESSION: Specimen radiograph of the right breast. Electronically Signed   By: Bary Richard M.D.   On: 11/19/2022 09:49  MM RT RADIOACTIVE SEED LOC MAMMO GUIDE  Result Date: 11/18/2022 CLINICAL DATA:  72 year old female presenting for seed localization of the right breast. Patient has newly diagnosed invasive ductal carcinoma. EXAM: MAMMOGRAPHIC GUIDED RADIOACTIVE SEED LOCALIZATION OF THE RIGHT BREAST COMPARISON:  Previous exam(s). FINDINGS: Patient presents for radioactive seed localization prior to right breast lumpectomy. I met with the patient and we discussed the procedure of seed localization including benefits and alternatives. We discussed the high likelihood of a successful procedure. We discussed the risks of the procedure including infection, bleeding, tissue injury and further surgery. We discussed the low dose of radioactivity involved in the procedure. Informed, written consent was given. The usual time-out protocol was performed immediately prior to the procedure. Using mammographic guidance, sterile technique, 1% lidocaine and an I-125 radioactive seed, the heart shaped biopsy marking clip in the right breast was localized using a medial approach. The follow-up mammogram images confirm the seed in the expected location and were marked for Dr. Dwain Sarna. Follow-up survey of the patient confirms presence of the radioactive seed. Order number of I-125 seed:  [id number]. Total activity: 0.254 mCi reference Date: October 22, 2022 The patient tolerated the procedure well and was [facility] from the [facility]. She was given instructions regarding seed removal. IMPRESSION: Radioactive seed localization right breast. No apparent complications. Electronically Signed   By: Emmaline Kluver M.D.   On: 11/18/2022  14:53      IMPRESSION/PLAN: 1. Stage IA, pT1cN0M0, grade 1, ER/PR positive invasive ductal carcinoma of the right breast. Dr. Mitzi Hansen has reviewed the patient's final pathology findings and today we discussed the findings. Fortunately her repeat prognostics indicate that her cancer is NOT triple positive, but rather ER/PR positive, and HER2 negative. She has done well since surgery and she does not need chemotherapy based on prognostics and by Oncotype Dx.  We discussed the rationale for external radiotherapy to the breast  to reduce risks of local recurrence followed by antiestrogen therapy. While she does meet criteria to consider avoiding radiation, given the tumor measurement is a large pT1c, and based on the initial concerns about HER2 amplification, and her performance status, radiation is recommended which she is motivated for as well. We discussed the risks, benefits, short, and long term effects of radiotherapy, as well as the curative intent, and the patient is interested in proceeding. We reviewed the delivery and logistics of radiotherapy and Dr. Mitzi Hansen recommends 4 weeks of radiotherapy to the right breast. Written consent is obtained and placed in the chart, a copy was provided to the patient. She will simulate today.    In a visit lasting 45 minutes, greater than 50% of the time was spent face to face reviewing her case, as well as in preparation of, discussing, and coordinating the patient's care.    Osker Mason, [facility]    **Disclaimer: This note was dictated with voice recognition software. Similar sounding words can inadvertently be transcribed and this note may contain transcription errors which may not have been corrected upon publication of note.**

## 2022-12-16 NOTE — Addendum Note (Signed)
Encounter addended by: Ronny Bacon, PA-C on: 12/16/2022 9:27 AM  Actions taken: Level of Service modified

## 2022-12-22 DIAGNOSIS — N3946 Mixed incontinence: Secondary | ICD-10-CM | POA: Diagnosis not present

## 2022-12-22 DIAGNOSIS — R350 Frequency of micturition: Secondary | ICD-10-CM | POA: Diagnosis not present

## 2022-12-22 DIAGNOSIS — F4310 Post-traumatic stress disorder, unspecified: Secondary | ICD-10-CM | POA: Diagnosis not present

## 2022-12-28 ENCOUNTER — Encounter: Payer: Self-pay | Admitting: *Deleted

## 2022-12-28 DIAGNOSIS — C50311 Malignant neoplasm of lower-inner quadrant of right female breast: Secondary | ICD-10-CM | POA: Diagnosis not present

## 2022-12-28 DIAGNOSIS — Z510 Encounter for antineoplastic radiation therapy: Secondary | ICD-10-CM | POA: Diagnosis not present

## 2022-12-28 DIAGNOSIS — Z170 Estrogen receptor positive status [ER+]: Secondary | ICD-10-CM | POA: Diagnosis not present

## 2022-12-29 ENCOUNTER — Other Ambulatory Visit: Payer: Self-pay

## 2022-12-29 ENCOUNTER — Ambulatory Visit
Admission: RE | Admit: 2022-12-29 | Discharge: 2022-12-29 | Disposition: A | Discharge status: Home / Self Care | Payer: PPO | Source: Ambulatory Visit | Attending: Radiation Oncology | Admitting: Radiation Oncology

## 2022-12-29 DIAGNOSIS — G4733 Obstructive sleep apnea (adult) (pediatric): Secondary | ICD-10-CM | POA: Diagnosis not present

## 2022-12-29 DIAGNOSIS — Z510 Encounter for antineoplastic radiation therapy: Secondary | ICD-10-CM | POA: Diagnosis not present

## 2022-12-29 DIAGNOSIS — R400 Somnolence: Secondary | ICD-10-CM | POA: Diagnosis not present

## 2022-12-29 DIAGNOSIS — Z170 Estrogen receptor positive status [ER+]: Secondary | ICD-10-CM | POA: Diagnosis not present

## 2022-12-29 DIAGNOSIS — C50311 Malignant neoplasm of lower-inner quadrant of right female breast: Secondary | ICD-10-CM | POA: Diagnosis not present

## 2022-12-29 LAB — RAD ONC ARIA SESSION SUMMARY
Course Elapsed Days: 0
Plan Fractions Treated to Date: 1
Plan Prescribed Dose Per Fraction: 2.66 Gy
Plan Total Fractions Prescribed: 16
Plan Total Prescribed Dose: 42.56 Gy
Reference Point Dosage Given to Date: 2.66 Gy
Reference Point Session Dosage Given: 2.66 Gy
Session Number: 1

## 2022-12-30 ENCOUNTER — Ambulatory Visit: Payer: PPO

## 2022-12-30 ENCOUNTER — Encounter: Payer: Self-pay | Admitting: Hematology and Oncology

## 2022-12-30 ENCOUNTER — Other Ambulatory Visit: Payer: Self-pay

## 2022-12-30 ENCOUNTER — Ambulatory Visit
Admission: RE | Admit: 2022-12-30 | Discharge: 2022-12-30 | Disposition: A | Discharge status: Home / Self Care | Payer: PPO | Source: Ambulatory Visit | Attending: Radiation Oncology | Admitting: Radiation Oncology

## 2022-12-30 ENCOUNTER — Telehealth: Payer: Self-pay | Admitting: Hematology and Oncology

## 2022-12-30 DIAGNOSIS — C50311 Malignant neoplasm of lower-inner quadrant of right female breast: Secondary | ICD-10-CM | POA: Diagnosis not present

## 2022-12-30 DIAGNOSIS — Z510 Encounter for antineoplastic radiation therapy: Secondary | ICD-10-CM | POA: Diagnosis not present

## 2022-12-30 DIAGNOSIS — G4733 Obstructive sleep apnea (adult) (pediatric): Secondary | ICD-10-CM | POA: Diagnosis not present

## 2022-12-30 DIAGNOSIS — R400 Somnolence: Secondary | ICD-10-CM | POA: Diagnosis not present

## 2022-12-30 DIAGNOSIS — Z170 Estrogen receptor positive status [ER+]: Secondary | ICD-10-CM | POA: Diagnosis not present

## 2022-12-30 LAB — RAD ONC ARIA SESSION SUMMARY
Course Elapsed Days: 1
Plan Fractions Treated to Date: 2
Plan Prescribed Dose Per Fraction: 2.66 Gy
Plan Total Fractions Prescribed: 16
Plan Total Prescribed Dose: 42.56 Gy
Reference Point Dosage Given to Date: 5.32 Gy
Reference Point Session Dosage Given: 2.66 Gy
Session Number: 2

## 2022-12-30 NOTE — Telephone Encounter (Signed)
Left patient a vm regarding upcoming appointment  

## 2022-12-31 ENCOUNTER — Ambulatory Visit
Admission: RE | Admit: 2022-12-31 | Discharge: 2022-12-31 | Disposition: A | Discharge status: Home / Self Care | Payer: PPO | Source: Ambulatory Visit | Attending: Radiation Oncology | Admitting: Radiation Oncology

## 2022-12-31 ENCOUNTER — Other Ambulatory Visit: Payer: Self-pay

## 2022-12-31 DIAGNOSIS — Z510 Encounter for antineoplastic radiation therapy: Secondary | ICD-10-CM | POA: Diagnosis not present

## 2022-12-31 DIAGNOSIS — Z170 Estrogen receptor positive status [ER+]: Secondary | ICD-10-CM | POA: Diagnosis not present

## 2022-12-31 DIAGNOSIS — C50311 Malignant neoplasm of lower-inner quadrant of right female breast: Secondary | ICD-10-CM | POA: Diagnosis not present

## 2022-12-31 DIAGNOSIS — Z803 Family history of malignant neoplasm of breast: Secondary | ICD-10-CM

## 2022-12-31 LAB — RAD ONC ARIA SESSION SUMMARY
Course Elapsed Days: 2
Plan Fractions Treated to Date: 3
Plan Prescribed Dose Per Fraction: 2.66 Gy
Plan Total Fractions Prescribed: 16
Plan Total Prescribed Dose: 42.56 Gy
Reference Point Dosage Given to Date: 7.98 Gy
Reference Point Session Dosage Given: 2.66 Gy
Session Number: 3

## 2022-12-31 MED ORDER — ALRA NON-METALLIC DEODORANT (RAD-ONC)
1.0000 | Freq: Once | TOPICAL | Status: AC
  Administered 2022-12-31: 1 via TOPICAL

## 2022-12-31 MED ORDER — RADIAPLEXRX EX GEL: Freq: Once | CUTANEOUS | Status: AC

## 2023-01-03 ENCOUNTER — Ambulatory Visit: Payer: PPO

## 2023-01-03 ENCOUNTER — Ambulatory Visit
Admission: RE | Admit: 2023-01-03 | Discharge: 2023-01-03 | Disposition: A | Discharge status: Home / Self Care | Payer: PPO | Source: Ambulatory Visit | Attending: Radiation Oncology | Admitting: Radiation Oncology

## 2023-01-03 ENCOUNTER — Other Ambulatory Visit: Payer: Self-pay

## 2023-01-03 DIAGNOSIS — C50311 Malignant neoplasm of lower-inner quadrant of right female breast: Secondary | ICD-10-CM | POA: Diagnosis not present

## 2023-01-03 DIAGNOSIS — Z510 Encounter for antineoplastic radiation therapy: Secondary | ICD-10-CM | POA: Diagnosis not present

## 2023-01-03 DIAGNOSIS — Z170 Estrogen receptor positive status [ER+]: Secondary | ICD-10-CM | POA: Diagnosis not present

## 2023-01-03 LAB — RAD ONC ARIA SESSION SUMMARY
Course Elapsed Days: 5
Plan Fractions Treated to Date: 4
Plan Prescribed Dose Per Fraction: 2.66 Gy
Plan Total Fractions Prescribed: 16
Plan Total Prescribed Dose: 42.56 Gy
Reference Point Dosage Given to Date: 10.64 Gy
Reference Point Session Dosage Given: 2.66 Gy
Session Number: 4

## 2023-01-04 ENCOUNTER — Other Ambulatory Visit: Payer: Self-pay

## 2023-01-04 ENCOUNTER — Ambulatory Visit
Admission: RE | Admit: 2023-01-04 | Discharge: 2023-01-04 | Disposition: A | Discharge status: Home / Self Care | Payer: PPO | Source: Ambulatory Visit | Attending: Radiation Oncology | Admitting: Radiation Oncology

## 2023-01-04 DIAGNOSIS — C50311 Malignant neoplasm of lower-inner quadrant of right female breast: Secondary | ICD-10-CM | POA: Diagnosis not present

## 2023-01-04 DIAGNOSIS — Z170 Estrogen receptor positive status [ER+]: Secondary | ICD-10-CM | POA: Diagnosis not present

## 2023-01-04 DIAGNOSIS — Z510 Encounter for antineoplastic radiation therapy: Secondary | ICD-10-CM | POA: Diagnosis not present

## 2023-01-04 LAB — RAD ONC ARIA SESSION SUMMARY
Course Elapsed Days: 6
Plan Fractions Treated to Date: 5
Plan Prescribed Dose Per Fraction: 2.66 Gy
Plan Total Fractions Prescribed: 16
Plan Total Prescribed Dose: 42.56 Gy
Reference Point Dosage Given to Date: 13.3 Gy
Reference Point Session Dosage Given: 2.66 Gy
Session Number: 5

## 2023-01-05 ENCOUNTER — Ambulatory Visit
Admission: RE | Admit: 2023-01-05 | Discharge: 2023-01-05 | Disposition: A | Discharge status: Home / Self Care | Payer: PPO | Source: Ambulatory Visit | Attending: Radiation Oncology | Admitting: Radiation Oncology

## 2023-01-05 ENCOUNTER — Other Ambulatory Visit: Payer: Self-pay

## 2023-01-05 ENCOUNTER — Ambulatory Visit: Payer: PPO | Attending: General Surgery

## 2023-01-05 DIAGNOSIS — R293 Abnormal posture: Secondary | ICD-10-CM | POA: Diagnosis not present

## 2023-01-05 DIAGNOSIS — Z170 Estrogen receptor positive status [ER+]: Secondary | ICD-10-CM | POA: Diagnosis not present

## 2023-01-05 DIAGNOSIS — M25611 Stiffness of right shoulder, not elsewhere classified: Secondary | ICD-10-CM | POA: Diagnosis not present

## 2023-01-05 DIAGNOSIS — Z483 Aftercare following surgery for neoplasm: Secondary | ICD-10-CM | POA: Diagnosis not present

## 2023-01-05 DIAGNOSIS — C50311 Malignant neoplasm of lower-inner quadrant of right female breast: Secondary | ICD-10-CM | POA: Diagnosis not present

## 2023-01-05 DIAGNOSIS — Z510 Encounter for antineoplastic radiation therapy: Secondary | ICD-10-CM | POA: Diagnosis not present

## 2023-01-05 LAB — RAD ONC ARIA SESSION SUMMARY
Course Elapsed Days: 7
Plan Fractions Treated to Date: 6
Plan Prescribed Dose Per Fraction: 2.66 Gy
Plan Total Fractions Prescribed: 16
Plan Total Prescribed Dose: 42.56 Gy
Reference Point Dosage Given to Date: 15.96 Gy
Reference Point Session Dosage Given: 2.66 Gy
Session Number: 6

## 2023-01-05 NOTE — Therapy (Signed)
OUTPATIENT PHYSICAL THERAPY BREAST TREATMENT   Patient Name: [name] [name] MRN: [id number] DOB:12-29-1950, 72 y.o., female Today's Date: 01/05/2023  END OF SESSION:  PT End of Session - 01/05/23 1230     Visit Number 3    Date for PT Re-Evaluation 01/31/23    PT Start Time 1147    PT Stop Time 1229    PT Time Calculation (min) 42 min    Activity Tolerance Patient tolerated treatment well    Behavior During Therapy WFL for tasks assessed/performed              Past Medical History:  Diagnosis Date   ADHD (attention deficit hyperactivity disorder)    Anemia    Anxiety    Arthritis    Depression    GERD (gastroesophageal reflux disease)    History of panic attacks    Hyperlipidemia    Hypertension    Hypothyroidism, postsurgical    Pre-diabetes    SBO (small bowel obstruction) (HCC) 01/15/2020   Sleep apnea    SUI (stress urinary incontinence, female)    Wears glasses    Past Surgical History:  Procedure Laterality Date   BREAST BIOPSY Right 10/07/2022   BREAST BIOPSY Right 10/07/2022   [place] RT BREAST BX W LOC DEV 1ST LESION IMG BX SPEC US GUIDE 10/07/2022 [facility]   BREAST BIOPSY  11/18/2022   MM RT RADIOACTIVE SEED LOC MAMMO GUIDE 11/18/2022 [facility]   BREAST LUMPECTOMY WITH RADIOACTIVE SEED AND SENTINEL LYMPH NODE BIOPSY Right 11/19/2022   Procedure: RIGHT BREAST LUMPECTOMY WITH RADIOACTIVE SEED;  Surgeon: [name], MD;  Location: [facility] OR;  Service: General;  Laterality: Right;   CATARACT EXTRACTION Bilateral    2023   D & C HYSTEROSCOPY W/ RESECTION ENDOMETRIAL POLYP  04/15/2009   D & C HYSTEROSCOPY W/ RESECTOSCOPE AND THERMACHOICE ENDOMETRIAL ABLATION  12/09/2010   EXCISION MELANOMA WITH SENTINEL LYMPH NODE BIOPSY Right 11/19/2022   Procedure: SENTINEL LYMPH NODE BIOPSY;  Surgeon: [name], MD;  Location: [facility] OR;  Service: General;  Laterality: Right;   LAPAROSCOPIC ASSISTED VENTRAL HERNIA REPAIR Left 01/16/2020   Procedure:  LAPAROSCOPIC ASSISTED HERNIA REPAIR;  Surgeon: Berna Bue, MD;  Location: [facility] OR;  Service: General;  Laterality: Left;   PUBOVAGINAL SLING N/A 07/26/2016   Procedure: PUBO-VAGINAL SLING ALTIS SLING;  Surgeon: Jethro Bolus, MD;  Location: [facility];  Service: Urology;  Laterality: N/A;   REPAIR SPIGELIAN HERNIA     RESECTION GIANT CELL TUMOR RIGHT LONG FINGER  09/13/2008   REVERSE SHOULDER ARTHROPLASTY Right 07/16/2021   Procedure: REVERSE SHOULDER ARTHROPLASTY;  Surgeon: Jones Broom, MD;  Location: [facility] OR[facility];  Service: Orthopedics;  Laterality: Right;   ROTATOR CUFF REPAIR Bilateral    THYROIDECTOMY  1979    [facility]   non-malignant diseased thyroid   THYROIDECTOMY Left 03/17/2021   Procedure: COMPLETION THYROIDECTOMY, LEFT LOBE;  Surgeon: Darnell Level, MD;  Location: [facility];  Service: General;  Laterality: Left;   TOTAL KNEE ARTHROPLASTY Right 12/17/2019   Procedure: RIGHT TOTAL KNEE ARTHROPLASTY;  Surgeon: Gean Birchwood, MD;  Location: [facility];  Service: Orthopedics;  Laterality: Right;   TOTAL KNEE ARTHROPLASTY Left 03/03/2020   Procedure: LEFT TOTAL KNEE ARTHROPLASTY;  Surgeon: Gean Birchwood, MD;  Location: [facility];  Service: Orthopedics;  Laterality: Left;   TRIPLE ARTHRODESIS LEFT FOOT  04/20/2007   VENTRAL HERNIA REPAIR  2003   WRIST GANGLION EXCISION Right 1975   Patient Active Problem List   Diagnosis  Date Noted   Genetic testing 11/01/2022   Family history of breast cancer 10/21/2022   Malignant neoplasm of lower-inner quadrant of right breast of female, estrogen receptor positive (HCC) 10/18/2022   Neoplasm of uncertain behavior of thyroid gland 03/15/2021   Left thyroid nodule 03/15/2021   S/P TKR (total knee replacement) using cement, left 03/03/2020   Degenerative arthritis of left knee 02/29/2020   SBO (small bowel obstruction) (HCC) 01/15/2020   S/P TKR (total knee replacement), right 12/19/2019   Total knee replacement status, right 12/17/2019    Osteoarthritis of right knee 12/14/2019   Obesity, morbid (HCC) 12/16/2016   Strain of iliopsoas muscle, initial encounter 07/15/2016   Elevated cholesterol    Urinary incontinence    Endometrial polyp    Primary osteoarthritis of knees, bilateral 05/15/2009   DERANGEMENT OF ANTERIOR HORN OF LATERAL MENISCUS 05/15/2009   KNEE PAIN 05/15/2009    REFERRING PROVIDER: Dr. [name]  REFERRING DIAG: Right breast cancer  THERAPY DIAG:  Abnormal posture  Stiffness of right shoulder, not elsewhere classified  Aftercare following surgery for neoplasm  Rationale for Evaluation and Treatment: Rehabilitation  ONSET DATE: 11/19/2022  SUBJECTIVE:                                                                                                                                                                                           SUBJECTIVE STATEMENT: I've been doing the exercises that I was given and it has helped to loosen me up.   From evaluation: Patient reports she underwent a right lumpectomy and sentinel node biopsy on 11/19/2022. The pathology showed the sentinel node biopsy did not contain any lymph node tissue and the tissue was benign. Her final pathology differed from her original biopsy results which showed it to be ER/PR positive and HER2 negative with a Ki67 of 2%. Initially her biopsy stated she was triple positive. Her Oncotype score is low but she will proceed to radiation and anti-estrogen therapy. She was diagnosed with an infection in her right breast and has been on antibiotics which ended 12/12/2022. She reports right shoulder dysfunction since her shoulder replacement 07/16/2021.  PERTINENT HISTORY:  Patient was diagnosed on 09/21/2022 with right grade 2 invasive ductal carcinoma breast cancer. She underwent a right lumpectomy and sentinel node biopsy on 11/19/2022 but there as not nodal tissue in the sentinel node biopsy. Final pathology was ER/PR positive and HER2  negative with a Ki67 of 2%. Hx left rotator cuff repair 08/05/20. Pins placed in left foot 2003. Hx of right shoulder replacement 07/16/2021.   PATIENT GOALS:  Reassess how my recovery is going related to  arm function, pain, and swelling.  PAIN:  Are you having pain? No  PRECAUTIONS: Recent Surgery, right UE Lymphedema risk, Other: previous right shoulder replacement 07/16/2021  ACTIVITY LEVEL / LEISURE: Not currently exercising but wants to get back to water exercise when her incision heals.   OBJECTIVE:   PATIENT SURVEYS:  QUICK DASH:   OBSERVATIONS: Right axillary incision and breast incisions are healing well. There is a slightly open area on her right breast incision (see photo in media). Messaged MD to make sure she does not need another antibiotics. No significant edema present in her breast. Right shoulder with significant compensatory techniques noted with flexion and abduction. Poor scapular mobility and shoulder hiking present.  POSTURE:  Forward head and rounded shoulders posture  LYMPHEDEMA ASSESSMENT:   UPPER EXTREMITY AROM/PROM:   A/PROM RIGHT   eval   RIGHT 12/13/2022  Shoulder extension 41 48  Shoulder flexion 81 75  Shoulder abduction 60 63  Shoulder internal rotation NT due to limited abduction NT due to limited abduction  Shoulder external rotation NT due to limited abduction NT due to limited abduction                          (Blank rows = not tested)   A/PROM LEFT   eval  Shoulder extension 52  Shoulder flexion 131  Shoulder abduction 159  Shoulder internal rotation 70  Shoulder external rotation 80                          (Blank rows = not tested)   CERVICAL AROM:     Percent limited at eval 10/20/2022 12/13/2022  Flexion WNL WNL  Extension 75% limited 75% limited  Right lateral flexion 75% limited 50% limited  Left lateral flexion 75% limited 50% limited  Right rotation 50% limited  50% limited  Left rotation 50% limited 50% limited       UPPER EXTREMITY STRENGTH: NT   LYMPHEDEMA ASSESSMENTS:    LANDMARK RIGHT   eval RIGHT 12/13/2022  10 cm proximal to olecranon process 41.3 43.2  Olecranon process 30.8 31.5  10 cm proximal to ulnar styloid process 25.6 25.1  Just proximal to ulnar styloid process 15.9 15.6  Across hand at thumb web space 19 19.5  At base of 2nd digit 5.8 5.8  (Blank rows = not tested)   LANDMARK LEFT   eval LEFT 12/13/2022  10 cm proximal to olecranon process 40.8 41.3  Olecranon process 30.2 29  10  cm proximal to ulnar styloid process 23.1 24.2  Just proximal to ulnar styloid process 15 15.4  Across hand at thumb web space 18.6 18.8  At base of 2nd digit 5.6 5.8  (Blank rows = not tested)    Surgery type/Date: 11/19/2022 Right lumpectomy and attempted sentinel node biopsy but no nodal tissue identified Number of lymph nodes removed: 0 Current/past treatment (chemo, radiation, hormone therapy): none Other symptoms:  Heaviness/tightness No Pain No Pitting edema No Infections Yes Decreased scar mobility Yes Stemmer sign No  Treatment:  Date: 01/05/23:  Overhead pulleys: flexion and scaption x 2-3 min each Ball rolls forward x10 3 way raises x10 each ER with red band x20 Doorway pec stretch x3 Education regarding HEP and importance of consistency  PATIENT EDUCATION:  Education details: Access Code: [email or web address] Person educated: Patient Education method: Explanation, Demonstration, and Verbal cues Education comprehension: verbalized understanding and returned demonstration  HOME EXERCISE PROGRAM:  Access Code: [email or web address] URL: [email or web address] Date: 01/05/2023 Prepared by: Tresa Endo  Exercises - Supine Chest Stretch with Elbows Bent  - 2 x daily - 7 x weekly - 5 reps - 5 hold - Standing Scapular Retraction  - 2 x daily - 7 x weekly - 10 reps - 2-3 hold - Standing Backward Shoulder Rolls  - 2 x daily - 7 x weekly - 10 reps - Doorway Pec Stretch at 90 Degrees Abduction  -  1 x daily - 7 x weekly - 2-3 reps - 20-30 hold - Standing Shoulder Flexion to 90 Degrees  - 1-2 x daily - 3 x weekly - 1 sets - 10 reps - 3 sec hold - Standing Shoulder Scaption  - 1-2 x daily - 3 x weekly - 1 sets - 10 reps - 3 sec hold - Shoulder Abduction - Thumbs Up  - 1-2 x daily - 3 x weekly - 1 sets - 10 reps - 3 sec hold - Seated Shoulder Flexion AAROM with Pulley Behind  - 2 x daily - 7 x weekly - 3 sets - 10 reps - Seated Shoulder Scaption AAROM with Pulley at Side  - 2 x daily - 7 x weekly - 3 sets - 10 reps - Standing Shoulder External Rotation with Resistance  - 2 x daily - 7 x weekly - 2 sets - 10 reps   ASSESSMENT:  CLINICAL IMPRESSION: First time follow-up for Rt shoulder treatment.  Pt had exercises from ABC class and has been consistent with a few of them.  PT reviewed all and updated program with new access code.  Pt was challenged by movement against gravity with Rt UE and required scapular cueing to reduce activation.  Pt will consider pulleys for home. Patient will benefit from skilled PT to address the below impairments and improve overall function.   Pt will benefit from skilled therapeutic intervention to improve on the following deficits: Decreased knowledge of precautions, impaired UE functional use, pain, decreased ROM, postural dysfunction.   PT treatment/interventions: ADL/Self care home management, Therapeutic exercises, Therapeutic activity, Patient/Family education, Self Care, and Re-evaluation   GOALS: Goals reviewed with patient? Yes  LONG TERM GOALS:  (STG=LTG)  GOALS Name Target Date  Goal status  1 Pt will demonstrate she has regained full shoulder ROM and function post operatively compared to baselines.  Baseline: 12/26/2022 MET  2 Patient will improve shoulder flexion to be able to reach into a shoulder height cabinet. 01/31/2023 (This is 4 weeks from beginning of treatment for shoulder issue) INITIAL  3 Patient will improve shoulder abduction to  reach out to the side out of her car window. 01/31/2023 (This is 4 weeks from beginning of treatment for shoulder issue) INITIAL  4 Patient will improve her DASH score to be </= 15 for improved overall function. 01/31/2023 (This is 4 weeks from beginning of treatment for shoulder issue) INITIAL     PLAN:  PT FREQUENCY/DURATION: 2x/week for 4 weeks beginning 01/03/2023  PLAN FOR NEXT SESSION: Rt shoulder strength, flexibility and endurance.  Measure ROM   Lorrene Reid, [place] 01/05/23 12:44 PM

## 2023-01-06 ENCOUNTER — Other Ambulatory Visit: Payer: Self-pay

## 2023-01-06 ENCOUNTER — Ambulatory Visit
Admission: RE | Admit: 2023-01-06 | Discharge: 2023-01-06 | Disposition: A | Discharge status: Home / Self Care | Payer: PPO | Source: Ambulatory Visit | Attending: Radiation Oncology | Admitting: Radiation Oncology

## 2023-01-06 DIAGNOSIS — Z170 Estrogen receptor positive status [ER+]: Secondary | ICD-10-CM | POA: Diagnosis not present

## 2023-01-06 DIAGNOSIS — Z510 Encounter for antineoplastic radiation therapy: Secondary | ICD-10-CM | POA: Diagnosis not present

## 2023-01-06 DIAGNOSIS — C50311 Malignant neoplasm of lower-inner quadrant of right female breast: Secondary | ICD-10-CM | POA: Diagnosis not present

## 2023-01-06 LAB — RAD ONC ARIA SESSION SUMMARY
Course Elapsed Days: 8
Plan Fractions Treated to Date: 7
Plan Prescribed Dose Per Fraction: 2.66 Gy
Plan Total Fractions Prescribed: 16
Plan Total Prescribed Dose: 42.56 Gy
Reference Point Dosage Given to Date: 18.62 Gy
Reference Point Session Dosage Given: 2.66 Gy
Session Number: 7

## 2023-01-07 ENCOUNTER — Ambulatory Visit
Admission: RE | Admit: 2023-01-07 | Discharge: 2023-01-07 | Disposition: A | Discharge status: Home / Self Care | Payer: PPO | Source: Ambulatory Visit | Attending: Radiation Oncology | Admitting: Radiation Oncology

## 2023-01-07 ENCOUNTER — Other Ambulatory Visit: Payer: Self-pay

## 2023-01-07 ENCOUNTER — Telehealth: Payer: Self-pay | Admitting: Hematology and Oncology

## 2023-01-07 DIAGNOSIS — Z170 Estrogen receptor positive status [ER+]: Secondary | ICD-10-CM | POA: Diagnosis not present

## 2023-01-07 DIAGNOSIS — Z510 Encounter for antineoplastic radiation therapy: Secondary | ICD-10-CM | POA: Diagnosis not present

## 2023-01-07 DIAGNOSIS — C50311 Malignant neoplasm of lower-inner quadrant of right female breast: Secondary | ICD-10-CM | POA: Diagnosis not present

## 2023-01-07 LAB — RAD ONC ARIA SESSION SUMMARY
Course Elapsed Days: 9
Plan Fractions Treated to Date: 8
Plan Prescribed Dose Per Fraction: 2.66 Gy
Plan Total Fractions Prescribed: 16
Plan Total Prescribed Dose: 42.56 Gy
Reference Point Dosage Given to Date: 21.28 Gy
Reference Point Session Dosage Given: 2.66 Gy
Session Number: 8

## 2023-01-07 NOTE — Telephone Encounter (Signed)
Rescheduled appointment per room resource. Left voicemail.  

## 2023-01-11 ENCOUNTER — Ambulatory Visit
Admission: RE | Admit: 2023-01-11 | Discharge: 2023-01-11 | Disposition: A | Discharge status: Home / Self Care | Payer: PPO | Source: Ambulatory Visit | Attending: Radiation Oncology | Admitting: Radiation Oncology

## 2023-01-11 ENCOUNTER — Other Ambulatory Visit: Payer: Self-pay

## 2023-01-11 DIAGNOSIS — Z510 Encounter for antineoplastic radiation therapy: Secondary | ICD-10-CM | POA: Diagnosis not present

## 2023-01-11 DIAGNOSIS — Z170 Estrogen receptor positive status [ER+]: Secondary | ICD-10-CM | POA: Diagnosis not present

## 2023-01-11 DIAGNOSIS — C50311 Malignant neoplasm of lower-inner quadrant of right female breast: Secondary | ICD-10-CM | POA: Diagnosis not present

## 2023-01-11 LAB — RAD ONC ARIA SESSION SUMMARY
Course Elapsed Days: 13
Plan Fractions Treated to Date: 9
Plan Prescribed Dose Per Fraction: 2.66 Gy
Plan Total Fractions Prescribed: 16
Plan Total Prescribed Dose: 42.56 Gy
Reference Point Dosage Given to Date: 23.94 Gy
Reference Point Session Dosage Given: 2.66 Gy
Session Number: 9

## 2023-01-12 ENCOUNTER — Ambulatory Visit: Payer: PPO

## 2023-01-12 DIAGNOSIS — Z483 Aftercare following surgery for neoplasm: Secondary | ICD-10-CM

## 2023-01-12 DIAGNOSIS — M25611 Stiffness of right shoulder, not elsewhere classified: Secondary | ICD-10-CM

## 2023-01-12 DIAGNOSIS — R293 Abnormal posture: Secondary | ICD-10-CM | POA: Diagnosis not present

## 2023-01-12 NOTE — Therapy (Signed)
OUTPATIENT PHYSICAL THERAPY BREAST TREATMENT   Patient Name: [name] [name] MRN: [id number] DOB:February 21, 1951, 72 y.o., female Today's Date: 01/12/2023  END OF SESSION:  PT End of Session - 01/12/23 1309     Visit Number 4    Date for PT Re-Evaluation 01/31/23    PT Start Time 1232    PT Stop Time 1311    PT Time Calculation (min) 39 min    Activity Tolerance Patient tolerated treatment well    Behavior During Therapy WFL for tasks assessed/performed               Past Medical History:  Diagnosis Date   ADHD (attention deficit hyperactivity disorder)    Anemia    Anxiety    Arthritis    Depression    GERD (gastroesophageal reflux disease)    History of panic attacks    Hyperlipidemia    Hypertension    Hypothyroidism, postsurgical    Pre-diabetes    SBO (small bowel obstruction) (HCC) 01/15/2020   Sleep apnea    SUI (stress urinary incontinence, female)    Wears glasses    Past Surgical History:  Procedure Laterality Date   BREAST BIOPSY Right 10/07/2022   BREAST BIOPSY Right 10/07/2022   [place] RT BREAST BX W LOC DEV 1ST LESION IMG BX SPEC US GUIDE 10/07/2022 [facility]   BREAST BIOPSY  11/18/2022   MM RT RADIOACTIVE SEED LOC MAMMO GUIDE 11/18/2022 [facility]   BREAST LUMPECTOMY WITH RADIOACTIVE SEED AND SENTINEL LYMPH NODE BIOPSY Right 11/19/2022   Procedure: RIGHT BREAST LUMPECTOMY WITH RADIOACTIVE SEED;  Surgeon: [name], MD;  Location: [facility] OR;  Service: General;  Laterality: Right;   CATARACT EXTRACTION Bilateral    2023   D & C HYSTEROSCOPY W/ RESECTION ENDOMETRIAL POLYP  04/15/2009   D & C HYSTEROSCOPY W/ RESECTOSCOPE AND THERMACHOICE ENDOMETRIAL ABLATION  12/09/2010   EXCISION MELANOMA WITH SENTINEL LYMPH NODE BIOPSY Right 11/19/2022   Procedure: SENTINEL LYMPH NODE BIOPSY;  Surgeon: [name], MD;  Location: [facility] OR;  Service: General;  Laterality: Right;   LAPAROSCOPIC ASSISTED VENTRAL HERNIA REPAIR Left 01/16/2020   Procedure:  LAPAROSCOPIC ASSISTED HERNIA REPAIR;  Surgeon: Berna Bue, MD;  Location: [facility] OR;  Service: General;  Laterality: Left;   PUBOVAGINAL SLING N/A 07/26/2016   Procedure: PUBO-VAGINAL SLING ALTIS SLING;  Surgeon: Jethro Bolus, MD;  Location: [facility];  Service: Urology;  Laterality: N/A;   REPAIR SPIGELIAN HERNIA     RESECTION GIANT CELL TUMOR RIGHT LONG FINGER  09/13/2008   REVERSE SHOULDER ARTHROPLASTY Right 07/16/2021   Procedure: REVERSE SHOULDER ARTHROPLASTY;  Surgeon: Jones Broom, MD;  Location: [facility];  Service: Orthopedics;  Laterality: Right;   ROTATOR CUFF REPAIR Bilateral    THYROIDECTOMY  1979    [facility]   non-malignant diseased thyroid   THYROIDECTOMY Left 03/17/2021   Procedure: COMPLETION THYROIDECTOMY, LEFT LOBE;  Surgeon: Darnell Level, MD;  Location: [facility];  Service: General;  Laterality: Left;   TOTAL KNEE ARTHROPLASTY Right 12/17/2019   Procedure: RIGHT TOTAL KNEE ARTHROPLASTY;  Surgeon: Gean Birchwood, MD;  Location: [facility];  Service: Orthopedics;  Laterality: Right;   TOTAL KNEE ARTHROPLASTY Left 03/03/2020   Procedure: LEFT TOTAL KNEE ARTHROPLASTY;  Surgeon: Gean Birchwood, MD;  Location: [facility];  Service: Orthopedics;  Laterality: Left;   TRIPLE ARTHRODESIS LEFT FOOT  04/20/2007   VENTRAL HERNIA REPAIR  2003   WRIST GANGLION EXCISION Right 1975   Patient Active Problem List  Diagnosis Date Noted   Genetic testing 11/01/2022   Family history of breast cancer 10/21/2022   Malignant neoplasm of lower-inner quadrant of right breast of female, estrogen receptor positive (HCC) 10/18/2022   Neoplasm of uncertain behavior of thyroid gland 03/15/2021   Left thyroid nodule 03/15/2021   S/P TKR (total knee replacement) using cement, left 03/03/2020   Degenerative arthritis of left knee 02/29/2020   SBO (small bowel obstruction) (HCC) 01/15/2020   S/P TKR (total knee replacement), right 12/19/2019   Total knee replacement status, right 12/17/2019    Osteoarthritis of right knee 12/14/2019   Obesity, morbid (HCC) 12/16/2016   Strain of iliopsoas muscle, initial encounter 07/15/2016   Elevated cholesterol    Urinary incontinence    Endometrial polyp    Primary osteoarthritis of knees, bilateral 05/15/2009   DERANGEMENT OF ANTERIOR HORN OF LATERAL MENISCUS 05/15/2009   KNEE PAIN 05/15/2009    REFERRING PROVIDER: Dr. [name]  REFERRING DIAG: Right breast cancer  THERAPY DIAG:  Abnormal posture  Stiffness of right shoulder, not elsewhere classified  Aftercare following surgery for neoplasm  Rationale for Evaluation and Treatment: Rehabilitation  ONSET DATE: 11/19/2022  SUBJECTIVE:                                                                                                                                                                                           SUBJECTIVE STATEMENT: I have pulleys but not sure I have the right door for it.  I haven't done the doorway stretch because I don't think about.  From evaluation: Patient reports she underwent a right lumpectomy and sentinel node biopsy on 11/19/2022. The pathology showed the sentinel node biopsy did not contain any lymph node tissue and the tissue was benign. Her final pathology differed from her original biopsy results which showed it to be ER/PR positive and HER2 negative with a Ki67 of 2%. Initially her biopsy stated she was triple positive. Her Oncotype score is low but she will proceed to radiation and anti-estrogen therapy. She was diagnosed with an infection in her right breast and has been on antibiotics which ended 12/12/2022. She reports right shoulder dysfunction since her shoulder replacement 07/16/2021.  PERTINENT HISTORY:  Patient was diagnosed on 09/21/2022 with right grade 2 invasive ductal carcinoma breast cancer. She underwent a right lumpectomy and sentinel node biopsy on 11/19/2022 but there as not nodal tissue in the sentinel node biopsy. Final  pathology was ER/PR positive and HER2 negative with a Ki67 of 2%. Hx left rotator cuff repair 08/05/20. Pins placed in left foot 2003. Hx of right shoulder replacement 07/16/2021.   PATIENT GOALS:  Reassess how my recovery is going related to arm function, pain, and swelling.  PAIN:  Are you having pain? No  PRECAUTIONS: Recent Surgery, right UE Lymphedema risk, Other: previous right shoulder replacement 07/16/2021  ACTIVITY LEVEL / LEISURE: Not currently exercising but wants to get back to water exercise when her incision heals.   OBJECTIVE:   PATIENT SURVEYS:  QUICK DASH:  OBSERVATIONS: Right axillary incision and breast incisions are healing well. There is a slightly open area on her right breast incision (see photo in media). Messaged MD to make sure she does not need another antibiotics. No significant edema present in her breast. Right shoulder with significant compensatory techniques noted with flexion and abduction. Poor scapular mobility and shoulder hiking present.  POSTURE:  Forward head and rounded shoulders posture  LYMPHEDEMA ASSESSMENT:   UPPER EXTREMITY AROM/PROM:   A/PROM RIGHT   eval   RIGHT 12/13/2022  Shoulder extension 41 48  Shoulder flexion 81 75  Shoulder abduction 60 63  Shoulder internal rotation NT due to limited abduction NT due to limited abduction  Shoulder external rotation NT due to limited abduction NT due to limited abduction                          (Blank rows = not tested)   A/PROM LEFT   eval  Shoulder extension 52  Shoulder flexion 131  Shoulder abduction 159  Shoulder internal rotation 70  Shoulder external rotation 80                          (Blank rows = not tested)   CERVICAL AROM:     Percent limited at eval 10/20/2022 12/13/2022  Flexion WNL WNL  Extension 75% limited 75% limited  Right lateral flexion 75% limited 50% limited  Left lateral flexion 75% limited 50% limited  Right rotation 50% limited  50% limited  Left  rotation 50% limited 50% limited      UPPER EXTREMITY STRENGTH: NT   LYMPHEDEMA ASSESSMENTS:    LANDMARK RIGHT   eval RIGHT 12/13/2022  10 cm proximal to olecranon process 41.3 43.2  Olecranon process 30.8 31.5  10 cm proximal to ulnar styloid process 25.6 25.1  Just proximal to ulnar styloid process 15.9 15.6  Across hand at thumb web space 19 19.5  At base of 2nd digit 5.8 5.8  (Blank rows = not tested)   LANDMARK LEFT   eval LEFT 12/13/2022  10 cm proximal to olecranon process 40.8 41.3  Olecranon process 30.2 29  10  cm proximal to ulnar styloid process 23.1 24.2  Just proximal to ulnar styloid process 15 15.4  Across hand at thumb web space 18.6 18.8  At base of 2nd digit 5.6 5.8  (Blank rows = not tested)    Surgery type/Date: 11/19/2022 Right lumpectomy and attempted sentinel node biopsy but no nodal tissue identified Number of lymph nodes removed: 0 Current/past treatment (chemo, radiation, hormone therapy): none Other symptoms:  Heaviness/tightness No Pain No Pitting edema No Infections Yes Decreased scar mobility Yes Stemmer sign No  Treatment:  Date: 01/12/23:  Overhead pulleys: flexion and scaption x 3 min each Ball rolls forward x10, lateral x5 each 3 way raises x10 each bil  ER with red band x20 Doorway pec stretch x3 Sink lat stretch 2x20    Date: 01/05/23:  Overhead pulleys: flexion and scaption x 2-3 min each Ball rolls forward x10 3 way  raises x10 each ER with red band x20 Doorway pec stretch x3 Education regarding HEP and importance of consistency  PATIENT EDUCATION:  Education details: Access Code: [email or web address] Person educated: Patient Education method: Explanation, Demonstration, and Verbal cues Education comprehension: verbalized understanding and returned demonstration  HOME EXERCISE PROGRAM: Access Code: [email or web address] URL: [email or web address] Date: 01/05/2023 Prepared by: Tresa Endo  Exercises - Supine Chest Stretch with  Elbows Bent  - 2 x daily - 7 x weekly - 5 reps - 5 hold - Standing Scapular Retraction  - 2 x daily - 7 x weekly - 10 reps - 2-3 hold - Standing Backward Shoulder Rolls  - 2 x daily - 7 x weekly - 10 reps - Doorway Pec Stretch at 90 Degrees Abduction  - 1 x daily - 7 x weekly - 2-3 reps - 20-30 hold - Standing Shoulder Flexion to 90 Degrees  - 1-2 x daily - 3 x weekly - 1 sets - 10 reps - 3 sec hold - Standing Shoulder Scaption  - 1-2 x daily - 3 x weekly - 1 sets - 10 reps - 3 sec hold - Shoulder Abduction - Thumbs Up  - 1-2 x daily - 3 x weekly - 1 sets - 10 reps - 3 sec hold - Seated Shoulder Flexion AAROM with Pulley Behind  - 2 x daily - 7 x weekly - 3 sets - 10 reps - Seated Shoulder Scaption AAROM with Pulley at Side  - 2 x daily - 7 x weekly - 3 sets - 10 reps - Standing Shoulder External Rotation with Resistance  - 2 x daily - 7 x weekly - 2 sets - 10 reps   ASSESSMENT:  CLINICAL IMPRESSION: Pt reports moderate compliance with HEP.  Pt continues to require tactile cues for scapular depression and retraction and pt was challenged by movement against gravity with Rt UE and required scapular cueing to reduce activation.  PT cued pt to get scapula set before moving arm and she was able to do this independently. Pt has home pulleys and is working to fine the right door for this. Patient will benefit from skilled PT to address the below impairments and improve overall function.   Pt will benefit from skilled therapeutic intervention to improve on the following deficits: Decreased knowledge of precautions, impaired UE functional use, pain, decreased ROM, postural dysfunction.   PT treatment/interventions: ADL/Self care home management, Therapeutic exercises, Therapeutic activity, Patient/Family education, Self Care, and Re-evaluation   GOALS: Goals reviewed with patient? Yes  LONG TERM GOALS:  (STG=LTG)  GOALS Name Target Date  Goal status  1 Pt will demonstrate she has regained full  shoulder ROM and function post operatively compared to baselines.  Baseline: 12/26/2022 MET  2 Patient will improve shoulder flexion to be able to reach into a shoulder height cabinet. 01/31/2023 (This is 4 weeks from beginning of treatment for shoulder issue) INITIAL  3 Patient will improve shoulder abduction to reach out to the side out of her car window. 01/31/2023 (This is 4 weeks from beginning of treatment for shoulder issue) INITIAL  4 Patient will improve her DASH score to be </= 15 for improved overall function. 01/31/2023 (This is 4 weeks from beginning of treatment for shoulder issue) INITIAL     PLAN:  PT FREQUENCY/DURATION: 2x/week for 4 weeks beginning 01/03/2023  PLAN FOR NEXT SESSION: Rt shoulder strength, flexibility and endurance.  Measure ROM   Lorrene Reid, PT 01/12/23 1:17 PM

## 2023-01-13 ENCOUNTER — Ambulatory Visit
Admission: RE | Admit: 2023-01-13 | Discharge: 2023-01-13 | Disposition: A | Discharge status: Home / Self Care | Payer: PPO | Source: Ambulatory Visit | Attending: Radiation Oncology | Admitting: Radiation Oncology

## 2023-01-13 ENCOUNTER — Other Ambulatory Visit: Payer: Self-pay

## 2023-01-13 ENCOUNTER — Ambulatory Visit: Payer: PPO

## 2023-01-13 DIAGNOSIS — R293 Abnormal posture: Secondary | ICD-10-CM | POA: Diagnosis not present

## 2023-01-13 DIAGNOSIS — Z170 Estrogen receptor positive status [ER+]: Secondary | ICD-10-CM | POA: Diagnosis not present

## 2023-01-13 DIAGNOSIS — M25611 Stiffness of right shoulder, not elsewhere classified: Secondary | ICD-10-CM

## 2023-01-13 DIAGNOSIS — Z510 Encounter for antineoplastic radiation therapy: Secondary | ICD-10-CM | POA: Diagnosis not present

## 2023-01-13 DIAGNOSIS — C50311 Malignant neoplasm of lower-inner quadrant of right female breast: Secondary | ICD-10-CM | POA: Diagnosis not present

## 2023-01-13 LAB — RAD ONC ARIA SESSION SUMMARY
Course Elapsed Days: 15
Plan Fractions Treated to Date: 10
Plan Prescribed Dose Per Fraction: 2.66 Gy
Plan Total Fractions Prescribed: 16
Plan Total Prescribed Dose: 42.56 Gy
Reference Point Dosage Given to Date: 26.6 Gy
Reference Point Session Dosage Given: 2.66 Gy
Session Number: 10

## 2023-01-13 NOTE — Therapy (Signed)
OUTPATIENT PHYSICAL THERAPY BREAST TREATMENT   Patient Name: [name] [name] MRN: [id number] DOB:1950/10/15, 72 y.o., female Today's Date: 01/13/2023  END OF SESSION:  PT End of Session - 01/13/23 1240     Visit Number 5    Date for PT Re-Evaluation 01/31/23    PT Start Time 1145    PT Stop Time 1224    PT Time Calculation (min) 39 min    Activity Tolerance Patient tolerated treatment well    Behavior During Therapy WFL for tasks assessed/performed                Past Medical History:  Diagnosis Date   ADHD (attention deficit hyperactivity disorder)    Anemia    Anxiety    Arthritis    Depression    GERD (gastroesophageal reflux disease)    History of panic attacks    Hyperlipidemia    Hypertension    Hypothyroidism, postsurgical    Pre-diabetes    SBO (small bowel obstruction) (HCC) 01/15/2020   Sleep apnea    SUI (stress urinary incontinence, female)    Wears glasses    Past Surgical History:  Procedure Laterality Date   BREAST BIOPSY Right 10/07/2022   BREAST BIOPSY Right 10/07/2022   [place] RT BREAST BX W LOC DEV 1ST LESION IMG BX SPEC US GUIDE 10/07/2022 [facility]   BREAST BIOPSY  11/18/2022   MM RT RADIOACTIVE SEED LOC MAMMO GUIDE 11/18/2022 [facility]   BREAST LUMPECTOMY WITH RADIOACTIVE SEED AND SENTINEL LYMPH NODE BIOPSY Right 11/19/2022   Procedure: RIGHT BREAST LUMPECTOMY WITH RADIOACTIVE SEED;  Surgeon: [name], MD;  Location: [facility];  Service: General;  Laterality: Right;   CATARACT EXTRACTION Bilateral    2023   D & C HYSTEROSCOPY W/ RESECTION ENDOMETRIAL POLYP  04/15/2009   D & C HYSTEROSCOPY W/ RESECTOSCOPE AND THERMACHOICE ENDOMETRIAL ABLATION  12/09/2010   EXCISION MELANOMA WITH SENTINEL LYMPH NODE BIOPSY Right 11/19/2022   Procedure: SENTINEL LYMPH NODE BIOPSY;  Surgeon: [name], MD;  Location: [facility] OR;  Service: General;  Laterality: Right;   LAPAROSCOPIC ASSISTED VENTRAL HERNIA REPAIR Left 01/16/2020   Procedure:  LAPAROSCOPIC ASSISTED HERNIA REPAIR;  Surgeon: Berna Bue, MD;  Location: [facility] OR;  Service: General;  Laterality: Left;   PUBOVAGINAL SLING N/A 07/26/2016   Procedure: PUBO-VAGINAL SLING ALTIS SLING;  Surgeon: Jethro Bolus, MD;  Location: [facility];  Service: Urology;  Laterality: N/A;   REPAIR SPIGELIAN HERNIA     RESECTION GIANT CELL TUMOR RIGHT LONG FINGER  09/13/2008   REVERSE SHOULDER ARTHROPLASTY Right 07/16/2021   Procedure: REVERSE SHOULDER ARTHROPLASTY;  Surgeon: Jones Broom, MD;  Location: [facility];  Service: Orthopedics;  Laterality: Right;   ROTATOR CUFF REPAIR Bilateral    THYROIDECTOMY  1979    [facility]   non-malignant diseased thyroid   THYROIDECTOMY Left 03/17/2021   Procedure: COMPLETION THYROIDECTOMY, LEFT LOBE;  Surgeon: Darnell Level, MD;  Location: [facility];  Service: General;  Laterality: Left;   TOTAL KNEE ARTHROPLASTY Right 12/17/2019   Procedure: RIGHT TOTAL KNEE ARTHROPLASTY;  Surgeon: Gean Birchwood, MD;  Location: [facility];  Service: Orthopedics;  Laterality: Right;   TOTAL KNEE ARTHROPLASTY Left 03/03/2020   Procedure: LEFT TOTAL KNEE ARTHROPLASTY;  Surgeon: Gean Birchwood, MD;  Location: [facility];  Service: Orthopedics;  Laterality: Left;   TRIPLE ARTHRODESIS LEFT FOOT  04/20/2007   VENTRAL HERNIA REPAIR  2003   WRIST GANGLION EXCISION Right 1975   Patient Active Problem List  Diagnosis Date Noted   Genetic testing 11/01/2022   Family history of breast cancer 10/21/2022   Malignant neoplasm of lower-inner quadrant of right breast of female, estrogen receptor positive (HCC) 10/18/2022   Neoplasm of uncertain behavior of thyroid gland 03/15/2021   Left thyroid nodule 03/15/2021   S/P TKR (total knee replacement) using cement, left 03/03/2020   Degenerative arthritis of left knee 02/29/2020   SBO (small bowel obstruction) (HCC) 01/15/2020   S/P TKR (total knee replacement), right 12/19/2019   Total knee replacement status, right 12/17/2019    Osteoarthritis of right knee 12/14/2019   Obesity, morbid (HCC) 12/16/2016   Strain of iliopsoas muscle, initial encounter 07/15/2016   Elevated cholesterol    Urinary incontinence    Endometrial polyp    Primary osteoarthritis of knees, bilateral 05/15/2009   DERANGEMENT OF ANTERIOR HORN OF LATERAL MENISCUS 05/15/2009   KNEE PAIN 05/15/2009    REFERRING PROVIDER: Dr. [name]  REFERRING DIAG: Right breast cancer  THERAPY DIAG:  Abnormal posture  Stiffness of right shoulder, not elsewhere classified  Rationale for Evaluation and Treatment: Rehabilitation  ONSET DATE: 11/19/2022  SUBJECTIVE:                                                                                                                                                                                           SUBJECTIVE STATEMENT: I am doing pulleys at home now.  I was exhausted yesterday after treatment yesterday.    From evaluation: Patient reports she underwent a right lumpectomy and sentinel node biopsy on 11/19/2022. The pathology showed the sentinel node biopsy did not contain any lymph node tissue and the tissue was benign. Her final pathology differed from her original biopsy results which showed it to be ER/PR positive and HER2 negative with a Ki67 of 2%. Initially her biopsy stated she was triple positive. Her Oncotype score is low but she will proceed to radiation and anti-estrogen therapy. She was diagnosed with an infection in her right breast and has been on antibiotics which ended 12/12/2022. She reports right shoulder dysfunction since her shoulder replacement 07/16/2021.  PERTINENT HISTORY:  Patient was diagnosed on 09/21/2022 with right grade 2 invasive ductal carcinoma breast cancer. She underwent a right lumpectomy and sentinel node biopsy on 11/19/2022 but there as not nodal tissue in the sentinel node biopsy. Final pathology was ER/PR positive and HER2 negative with a Ki67 of 2%. Hx left rotator  cuff repair 08/05/20. Pins placed in left foot 2003. Hx of right shoulder replacement 07/16/2021.   PATIENT GOALS:  Reassess how my recovery is going related to arm function, pain, and swelling.  PAIN:  Are you having pain? No  PRECAUTIONS: Recent Surgery, right UE Lymphedema risk, Other: previous right shoulder replacement 07/16/2021  ACTIVITY LEVEL / LEISURE: Not currently exercising but wants to get back to water exercise when her incision heals.   OBJECTIVE:   PATIENT SURVEYS:  QUICK DASH:  OBSERVATIONS: Right axillary incision and breast incisions are healing well. There is a slightly open area on her right breast incision (see photo in media). Messaged MD to make sure she does not need another antibiotics. No significant edema present in her breast. Right shoulder with significant compensatory techniques noted with flexion and abduction. Poor scapular mobility and shoulder hiking present.  POSTURE:  Forward head and rounded shoulders posture  LYMPHEDEMA ASSESSMENT:   UPPER EXTREMITY AROM/PROM:   A/PROM RIGHT   eval   Right 01/13/23 RIGHT 12/13/2022  Shoulder extension 41  48  Shoulder flexion 81 91 75  Shoulder abduction 60 85 63  Shoulder internal rotation NT due to limited abduction To L1 NT due to limited abduction  Shoulder external rotation NT due to limited abduction  NT due to limited abduction                          (Blank rows = not tested)   A/PROM LEFT   eval  Shoulder extension 52  Shoulder flexion 131  Shoulder abduction 159  Shoulder internal rotation 70  Shoulder external rotation 80                          (Blank rows = not tested)   CERVICAL AROM:     Percent limited at eval 10/20/2022 12/13/2022  Flexion WNL WNL  Extension 75% limited 75% limited  Right lateral flexion 75% limited 50% limited  Left lateral flexion 75% limited 50% limited  Right rotation 50% limited  50% limited  Left rotation 50% limited 50% limited      UPPER EXTREMITY  STRENGTH: NT   LYMPHEDEMA ASSESSMENTS:    LANDMARK RIGHT   eval RIGHT 12/13/2022  10 cm proximal to olecranon process 41.3 43.2  Olecranon process 30.8 31.5  10 cm proximal to ulnar styloid process 25.6 25.1  Just proximal to ulnar styloid process 15.9 15.6  Across hand at thumb web space 19 19.5  At base of 2nd digit 5.8 5.8  (Blank rows = not tested)   LANDMARK LEFT   eval LEFT 12/13/2022  10 cm proximal to olecranon process 40.8 41.3  Olecranon process 30.2 29  10  cm proximal to ulnar styloid process 23.1 24.2  Just proximal to ulnar styloid process 15 15.4  Across hand at thumb web space 18.6 18.8  At base of 2nd digit 5.6 5.8  (Blank rows = not tested)    Surgery type/Date: 11/19/2022 Right lumpectomy and attempted sentinel node biopsy but no nodal tissue identified Number of lymph nodes removed: 0 Current/past treatment (chemo, radiation, hormone therapy): none Other symptoms:  Heaviness/tightness No Pain No Pitting edema No Infections Yes Decreased scar mobility Yes Stemmer sign No  Treatment:  Date: 01/13/23:  Overhead pulleys: flexion and scaption x 3 min each, IR with pulleys x2 minutes Ball rolls forward x10, lateral x5 each Supine chest press: 3# with cane  Overhead flexion with cane with 3# added 2x10 Supine: narrow grip chest press: 3# 2x10  Date: 01/12/23:  Overhead pulleys: flexion and scaption x 3 min each Ball rolls forward x10, lateral x5 each  3 way raises x10 each bil  ER with red band x20 Doorway pec stretch x3 Sink lat stretch 2x20    Date: 01/05/23:  Overhead pulleys: flexion and scaption x 2-3 min each Ball rolls forward x10 3 way raises x10 each ER with red band x20 Doorway pec stretch x3 Education regarding HEP and importance of consistency  PATIENT EDUCATION:  Education details: Access Code: [email or web address] Person educated: Patient Education method: Explanation, Demonstration, and Verbal cues Education comprehension: verbalized  understanding and returned demonstration  HOME EXERCISE PROGRAM: Access Code: [email or web address] URL: [email or web address] Date: 01/13/2023 Prepared by: Tresa Endo  Exercises - Supine Chest Stretch with Elbows Bent  - 2 x daily - 7 x weekly - 5 reps - 5 hold - Standing Scapular Retraction  - 2 x daily - 7 x weekly - 10 reps - 2-3 hold - Standing Backward Shoulder Rolls  - 2 x daily - 7 x weekly - 10 reps - Doorway Pec Stretch at 90 Degrees Abduction  - 1 x daily - 7 x weekly - 2-3 reps - 20-30 hold - Standing Shoulder Flexion to 90 Degrees  - 1-2 x daily - 3 x weekly - 1 sets - 10 reps - 3 sec hold - Standing Shoulder Scaption  - 1-2 x daily - 3 x weekly - 1 sets - 10 reps - 3 sec hold - Shoulder Abduction - Thumbs Up  - 1-2 x daily - 3 x weekly - 1 sets - 10 reps - 3 sec hold - Seated Shoulder Flexion AAROM with Pulley Behind  - 2 x daily - 7 x weekly - 3 sets - 10 reps - Seated Shoulder Scaption AAROM with Pulley at Side  - 2 x daily - 7 x weekly - 3 sets - 10 reps - Standing Shoulder External Rotation with Resistance  - 2 x daily - 7 x weekly - 2 sets - 10 reps - Supine Shoulder Press AAROM in Abduction with Dowel  - 2 x daily - 7 x weekly - 1-2 sets - 10 reps - Supine Shoulder Flexion with Dowel  - 2 x daily - 7 x weekly - 1-2 sets - 10 reps - 5-10 hold   ASSESSMENT:  CLINICAL IMPRESSION: Pt was here yesterday so limited progress since then. Pt continues to require tactile cues for scapular depression and retraction and pt was challenged by movement against gravity with Rt UE and required scapular cueing to reduce activation. Pt with improved ROM in all directions.  Pt was able to perform IR with pulleys.  Patient will benefit from skilled PT to address the below impairments and improve overall function.   Pt will benefit from skilled therapeutic intervention to improve on the following deficits: Decreased knowledge of precautions, impaired UE functional use, pain, decreased ROM,  postural dysfunction.   PT treatment/interventions: ADL/Self care home management, Therapeutic exercises, Therapeutic activity, Patient/Family education, Self Care, and Re-evaluation   GOALS: Goals reviewed with patient? Yes  LONG TERM GOALS:  (STG=LTG)  GOALS Name Target Date  Goal status  1 Pt will demonstrate she has regained full shoulder ROM and function post operatively compared to baselines.  Baseline: 12/26/2022 IN PROGRESS  2 Patient will improve shoulder flexion to be able to reach into a shoulder height cabinet. 01/31/2023 (This is 4 weeks from beginning of treatment for shoulder issue) In progress  91 degrees flexion with scapular elevation (01/13/23)  3 Patient will improve shoulder abduction to reach out to the side out of her car window.  01/31/2023 (This is 4 weeks from beginning of treatment for shoulder issue) In progress  81 (01/13/23)  4 Patient will improve her DASH score to be </= 15 for improved overall function. 01/31/2023 (This is 4 weeks from beginning of treatment for shoulder issue) INITIAL     PLAN:  PT FREQUENCY/DURATION: 2x/week for 4 weeks beginning 01/03/2023  PLAN FOR NEXT SESSION: Rt shoulder strength, flexibility and endurance.   Lorrene Reid, PT 01/13/23 12:42 PM

## 2023-01-14 ENCOUNTER — Ambulatory Visit: Payer: PPO

## 2023-01-14 ENCOUNTER — Other Ambulatory Visit: Payer: Self-pay

## 2023-01-14 ENCOUNTER — Ambulatory Visit
Admission: RE | Admit: 2023-01-14 | Discharge: 2023-01-14 | Disposition: A | Discharge status: Home / Self Care | Payer: PPO | Source: Ambulatory Visit | Attending: Radiation Oncology | Admitting: Radiation Oncology

## 2023-01-14 DIAGNOSIS — C50311 Malignant neoplasm of lower-inner quadrant of right female breast: Secondary | ICD-10-CM | POA: Diagnosis not present

## 2023-01-14 DIAGNOSIS — Z170 Estrogen receptor positive status [ER+]: Secondary | ICD-10-CM | POA: Diagnosis not present

## 2023-01-14 DIAGNOSIS — Z510 Encounter for antineoplastic radiation therapy: Secondary | ICD-10-CM | POA: Diagnosis not present

## 2023-01-14 LAB — RAD ONC ARIA SESSION SUMMARY
Course Elapsed Days: 16
Plan Fractions Treated to Date: 11
Plan Prescribed Dose Per Fraction: 2.66 Gy
Plan Total Fractions Prescribed: 16
Plan Total Prescribed Dose: 42.56 Gy
Reference Point Dosage Given to Date: 29.26 Gy
Reference Point Session Dosage Given: 2.66 Gy
Session Number: 11

## 2023-01-17 ENCOUNTER — Ambulatory Visit
Admission: RE | Admit: 2023-01-17 | Discharge: 2023-01-17 | Disposition: A | Discharge status: Home / Self Care | Payer: PPO | Source: Ambulatory Visit | Attending: Radiation Oncology | Admitting: Radiation Oncology

## 2023-01-17 ENCOUNTER — Ambulatory Visit: Payer: PPO | Attending: General Surgery

## 2023-01-17 ENCOUNTER — Other Ambulatory Visit: Payer: Self-pay

## 2023-01-17 DIAGNOSIS — C50311 Malignant neoplasm of lower-inner quadrant of right female breast: Secondary | ICD-10-CM | POA: Diagnosis not present

## 2023-01-17 DIAGNOSIS — R293 Abnormal posture: Secondary | ICD-10-CM | POA: Insufficient documentation

## 2023-01-17 DIAGNOSIS — M25611 Stiffness of right shoulder, not elsewhere classified: Secondary | ICD-10-CM | POA: Diagnosis not present

## 2023-01-17 DIAGNOSIS — Z510 Encounter for antineoplastic radiation therapy: Secondary | ICD-10-CM | POA: Diagnosis not present

## 2023-01-17 DIAGNOSIS — Z170 Estrogen receptor positive status [ER+]: Secondary | ICD-10-CM | POA: Diagnosis not present

## 2023-01-17 DIAGNOSIS — Z483 Aftercare following surgery for neoplasm: Secondary | ICD-10-CM | POA: Diagnosis not present

## 2023-01-17 LAB — RAD ONC ARIA SESSION SUMMARY
Course Elapsed Days: 19
Plan Fractions Treated to Date: 12
Plan Prescribed Dose Per Fraction: 2.66 Gy
Plan Total Fractions Prescribed: 16
Plan Total Prescribed Dose: 42.56 Gy
Reference Point Dosage Given to Date: 31.92 Gy
Reference Point Session Dosage Given: 2.66 Gy
Session Number: 12

## 2023-01-17 NOTE — Therapy (Signed)
OUTPATIENT PHYSICAL THERAPY BREAST TREATMENT   Patient Name: [name] [name] MRN: [id number] DOB:1950-09-04, 72 y.o., female Today's Date: 01/17/2023  END OF SESSION:  PT End of Session - 01/17/23 1328     Visit Number 6    Date for PT Re-Evaluation 01/31/23    PT Start Time 1231    PT Stop Time 1314    PT Time Calculation (min) 43 min    Activity Tolerance Patient tolerated treatment well    Behavior During Therapy WFL for tasks assessed/performed                 Past Medical History:  Diagnosis Date   ADHD (attention deficit hyperactivity disorder)    Anemia    Anxiety    Arthritis    Depression    GERD (gastroesophageal reflux disease)    History of panic attacks    Hyperlipidemia    Hypertension    Hypothyroidism, postsurgical    Pre-diabetes    SBO (small bowel obstruction) (HCC) 01/15/2020   Sleep apnea    SUI (stress urinary incontinence, female)    Wears glasses    Past Surgical History:  Procedure Laterality Date   BREAST BIOPSY Right 10/07/2022   BREAST BIOPSY Right 10/07/2022   [place] RT BREAST BX W LOC DEV 1ST LESION IMG BX SPEC US GUIDE 10/07/2022 [facility]   BREAST BIOPSY  11/18/2022   MM RT RADIOACTIVE SEED LOC MAMMO GUIDE 11/18/2022 [facility]   BREAST LUMPECTOMY WITH RADIOACTIVE SEED AND SENTINEL LYMPH NODE BIOPSY Right 11/19/2022   Procedure: RIGHT BREAST LUMPECTOMY WITH RADIOACTIVE SEED;  Surgeon: [name], MD;  Location: [facility];  Service: General;  Laterality: Right;   CATARACT EXTRACTION Bilateral    2023   D & C HYSTEROSCOPY W/ RESECTION ENDOMETRIAL POLYP  04/15/2009   D & C HYSTEROSCOPY W/ RESECTOSCOPE AND THERMACHOICE ENDOMETRIAL ABLATION  12/09/2010   EXCISION MELANOMA WITH SENTINEL LYMPH NODE BIOPSY Right 11/19/2022   Procedure: SENTINEL LYMPH NODE BIOPSY;  Surgeon: [name], MD;  Location: [facility] OR;  Service: General;  Laterality: Right;   LAPAROSCOPIC ASSISTED VENTRAL HERNIA REPAIR Left 01/16/2020   Procedure:  LAPAROSCOPIC ASSISTED HERNIA REPAIR;  Surgeon: Berna Bue, MD;  Location: [facility] OR;  Service: General;  Laterality: Left;   PUBOVAGINAL SLING N/A 07/26/2016   Procedure: PUBO-VAGINAL SLING ALTIS SLING;  Surgeon: Jethro Bolus, MD;  Location: [facility];  Service: Urology;  Laterality: N/A;   REPAIR SPIGELIAN HERNIA     RESECTION GIANT CELL TUMOR RIGHT LONG FINGER  09/13/2008   REVERSE SHOULDER ARTHROPLASTY Right 07/16/2021   Procedure: REVERSE SHOULDER ARTHROPLASTY;  Surgeon: Jones Broom, MD;  Location: [facility];  Service: Orthopedics;  Laterality: Right;   ROTATOR CUFF REPAIR Bilateral    THYROIDECTOMY  1979    [facility]   non-malignant diseased thyroid   THYROIDECTOMY Left 03/17/2021   Procedure: COMPLETION THYROIDECTOMY, LEFT LOBE;  Surgeon: Darnell Level, MD;  Location: [facility];  Service: General;  Laterality: Left;   TOTAL KNEE ARTHROPLASTY Right 12/17/2019   Procedure: RIGHT TOTAL KNEE ARTHROPLASTY;  Surgeon: Gean Birchwood, MD;  Location: [facility];  Service: Orthopedics;  Laterality: Right;   TOTAL KNEE ARTHROPLASTY Left 03/03/2020   Procedure: LEFT TOTAL KNEE ARTHROPLASTY;  Surgeon: Gean Birchwood, MD;  Location: [facility];  Service: Orthopedics;  Laterality: Left;   TRIPLE ARTHRODESIS LEFT FOOT  04/20/2007   VENTRAL HERNIA REPAIR  2003   WRIST GANGLION EXCISION Right 1975   Patient Active Problem List  Diagnosis Date Noted   Genetic testing 11/01/2022   Family history of breast cancer 10/21/2022   Malignant neoplasm of lower-inner quadrant of right breast of female, estrogen receptor positive (HCC) 10/18/2022   Neoplasm of uncertain behavior of thyroid gland 03/15/2021   Left thyroid nodule 03/15/2021   S/P TKR (total knee replacement) using cement, left 03/03/2020   Degenerative arthritis of left knee 02/29/2020   SBO (small bowel obstruction) (HCC) 01/15/2020   S/P TKR (total knee replacement), right 12/19/2019   Total knee replacement status, right 12/17/2019    Osteoarthritis of right knee 12/14/2019   Obesity, morbid (HCC) 12/16/2016   Strain of iliopsoas muscle, initial encounter 07/15/2016   Elevated cholesterol    Urinary incontinence    Endometrial polyp    Primary osteoarthritis of knees, bilateral 05/15/2009   DERANGEMENT OF ANTERIOR HORN OF LATERAL MENISCUS 05/15/2009   KNEE PAIN 05/15/2009    REFERRING PROVIDER: Dr. [name]  REFERRING DIAG: Right breast cancer  THERAPY DIAG:  Abnormal posture  Stiffness of right shoulder, not elsewhere classified  Aftercare following surgery for neoplasm  Rationale for Evaluation and Treatment: Rehabilitation  ONSET DATE: 11/19/2022  SUBJECTIVE:                                                                                                                                                                                           SUBJECTIVE STATEMENT: I am working on my posture and keeping my shoulder down.   From evaluation: Patient reports she underwent a right lumpectomy and sentinel node biopsy on 11/19/2022. The pathology showed the sentinel node biopsy did not contain any lymph node tissue and the tissue was benign. Her final pathology differed from her original biopsy results which showed it to be ER/PR positive and HER2 negative with a Ki67 of 2%. Initially her biopsy stated she was triple positive. Her Oncotype score is low but she will proceed to radiation and anti-estrogen therapy. She was diagnosed with an infection in her right breast and has been on antibiotics which ended 12/12/2022. She reports right shoulder dysfunction since her shoulder replacement 07/16/2021.  PERTINENT HISTORY:  Patient was diagnosed on 09/21/2022 with right grade 2 invasive ductal carcinoma breast cancer. She underwent a right lumpectomy and sentinel node biopsy on 11/19/2022 but there as not nodal tissue in the sentinel node biopsy. Final pathology was ER/PR positive and HER2 negative with a Ki67 of 2%. Hx  left rotator cuff repair 08/05/20. Pins placed in left foot 2003. Hx of right shoulder replacement 07/16/2021.   PATIENT GOALS:  Reassess how my recovery is going related to arm function, pain, and swelling.  PAIN:  Are you having pain? No  PRECAUTIONS: Recent Surgery, right UE Lymphedema risk, Other: previous right shoulder replacement 07/16/2021  ACTIVITY LEVEL / LEISURE: Not currently exercising but wants to get back to water exercise when her incision heals.   OBJECTIVE:   PATIENT SURVEYS:  QUICK DASH:  OBSERVATIONS: Right axillary incision and breast incisions are healing well. There is a slightly open area on her right breast incision (see photo in media). Messaged MD to make sure she does not need another antibiotics. No significant edema present in her breast. Right shoulder with significant compensatory techniques noted with flexion and abduction. Poor scapular mobility and shoulder hiking present.  POSTURE:  Forward head and rounded shoulders posture  LYMPHEDEMA ASSESSMENT:   UPPER EXTREMITY AROM/PROM:   A/PROM RIGHT   eval   Right 01/13/23 RIGHT 12/13/2022  Shoulder extension 41  48  Shoulder flexion 81 91 75  Shoulder abduction 60 85 63  Shoulder internal rotation NT due to limited abduction To L1 NT due to limited abduction  Shoulder external rotation NT due to limited abduction  NT due to limited abduction                          (Blank rows = not tested)   A/PROM LEFT   eval  Shoulder extension 52  Shoulder flexion 131  Shoulder abduction 159  Shoulder internal rotation 70  Shoulder external rotation 80                          (Blank rows = not tested)   CERVICAL AROM:     Percent limited at eval 10/20/2022 12/13/2022  Flexion WNL WNL  Extension 75% limited 75% limited  Right lateral flexion 75% limited 50% limited  Left lateral flexion 75% limited 50% limited  Right rotation 50% limited  50% limited  Left rotation 50% limited 50% limited       UPPER EXTREMITY STRENGTH: NT   LYMPHEDEMA ASSESSMENTS:    LANDMARK RIGHT   eval RIGHT 12/13/2022  10 cm proximal to olecranon process 41.3 43.2  Olecranon process 30.8 31.5  10 cm proximal to ulnar styloid process 25.6 25.1  Just proximal to ulnar styloid process 15.9 15.6  Across hand at thumb web space 19 19.5  At base of 2nd digit 5.8 5.8  (Blank rows = not tested)   LANDMARK LEFT   eval LEFT 12/13/2022  10 cm proximal to olecranon process 40.8 41.3  Olecranon process 30.2 29  10  cm proximal to ulnar styloid process 23.1 24.2  Just proximal to ulnar styloid process 15 15.4  Across hand at thumb web space 18.6 18.8  At base of 2nd digit 5.6 5.8  (Blank rows = not tested)    Surgery type/Date: 11/19/2022 Right lumpectomy and attempted sentinel node biopsy but no nodal tissue identified Number of lymph nodes removed: 0 Current/past treatment (chemo, radiation, hormone therapy): none Other symptoms:  Heaviness/tightness No Pain No Pitting edema No Infections Yes Decreased scar mobility Yes Stemmer sign No  Treatment:  Date: 01/17/23:  Discussion regarding exercises, expected progress and realistic expectations  UE ranger flexion on wall x10 Ball rolls forward x10, lateral x5 each Supine chest press: 3# with cane  Overhead flexion with cane with 3# added 2x10 Supine: narrow grip chest press: 3# 2x10 Finger ladder: flexion x 10  Date: 01/13/23:  Overhead pulleys: flexion and scaption x 3 min each, IR  with pulleys x2 minutes Ball rolls forward x10, lateral x5 each Supine chest press: 3# with cane  Overhead flexion with cane with 3# added 2x10 Supine: narrow grip chest press: 3# 2x10  Date: 01/12/23:  Overhead pulleys: flexion and scaption x 3 min each Ball rolls forward x10, lateral x5 each 3 way raises x10 each bil  ER with red band x20 Doorway pec stretch x3 Sink lat stretch 2x20    PATIENT EDUCATION:  Education details: Access Code: [email or web address] Person educated:  Patient Education method: Explanation, Demonstration, and Verbal cues Education comprehension: verbalized understanding and returned demonstration  HOME EXERCISE PROGRAM: Access Code: [email or web address] URL: [email or web address] Date: 01/13/2023 Prepared by: Tresa Endo  Exercises - Supine Chest Stretch with Elbows Bent  - 2 x daily - 7 x weekly - 5 reps - 5 hold - Standing Scapular Retraction  - 2 x daily - 7 x weekly - 10 reps - 2-3 hold - Standing Backward Shoulder Rolls  - 2 x daily - 7 x weekly - 10 reps - Doorway Pec Stretch at 90 Degrees Abduction  - 1 x daily - 7 x weekly - 2-3 reps - 20-30 hold - Standing Shoulder Flexion to 90 Degrees  - 1-2 x daily - 3 x weekly - 1 sets - 10 reps - 3 sec hold - Standing Shoulder Scaption  - 1-2 x daily - 3 x weekly - 1 sets - 10 reps - 3 sec hold - Shoulder Abduction - Thumbs Up  - 1-2 x daily - 3 x weekly - 1 sets - 10 reps - 3 sec hold - Seated Shoulder Flexion AAROM with Pulley Behind  - 2 x daily - 7 x weekly - 3 sets - 10 reps - Seated Shoulder Scaption AAROM with Pulley at Side  - 2 x daily - 7 x weekly - 3 sets - 10 reps - Standing Shoulder External Rotation with Resistance  - 2 x daily - 7 x weekly - 2 sets - 10 reps - Supine Shoulder Press AAROM in Abduction with Dowel  - 2 x daily - 7 x weekly - 1-2 sets - 10 reps - Supine Shoulder Flexion with Dowel  - 2 x daily - 7 x weekly - 1-2 sets - 10 reps - 5-10 hold   ASSESSMENT:  CLINICAL IMPRESSION: Early part of session spent discussion expected progress for Rt shoulder s/p replacement surgery.  Pt with improved ROM in all directions last week.  Pt tolerated activity today without increased pain and required fewer tactile cues for scapular depression.   Patient will benefit from skilled PT to address the below impairments and improve overall function.   Pt will benefit from skilled therapeutic intervention to improve on the following deficits: Decreased knowledge of precautions, impaired  UE functional use, pain, decreased ROM, postural dysfunction.   PT treatment/interventions: ADL/Self care home management, Therapeutic exercises, Therapeutic activity, Patient/Family education, Self Care, and Re-evaluation   GOALS: Goals reviewed with patient? Yes  LONG TERM GOALS:  (STG=LTG)  GOALS Name Target Date  Goal status  1 Pt will demonstrate she has regained full shoulder ROM and function post operatively compared to baselines.  Baseline: 12/26/2022 IN PROGRESS  2 Patient will improve shoulder flexion to be able to reach into a shoulder height cabinet. 01/31/2023 (This is 4 weeks from beginning of treatment for shoulder issue) In progress  91 degrees flexion with scapular elevation (01/13/23)  3 Patient will improve shoulder abduction to reach out to the side out of her  car window. 01/31/2023 (This is 4 weeks from beginning of treatment for shoulder issue) In progress  81 (01/13/23)  4 Patient will improve her DASH score to be </= 15 for improved overall function. 01/31/2023 (This is 4 weeks from beginning of treatment for shoulder issue) INITIAL     PLAN:  PT FREQUENCY/DURATION: 2x/week for 4 weeks beginning 01/03/2023  PLAN FOR NEXT SESSION: Rt shoulder strength, flexibility and endurance.   Lorrene Reid, PT 01/17/23 1:28 PM

## 2023-01-18 ENCOUNTER — Other Ambulatory Visit: Payer: Self-pay

## 2023-01-18 ENCOUNTER — Ambulatory Visit
Admission: RE | Admit: 2023-01-18 | Discharge: 2023-01-18 | Disposition: A | Discharge status: Home / Self Care | Payer: PPO | Source: Ambulatory Visit | Attending: Radiation Oncology | Admitting: Radiation Oncology

## 2023-01-18 DIAGNOSIS — Z170 Estrogen receptor positive status [ER+]: Secondary | ICD-10-CM | POA: Diagnosis not present

## 2023-01-18 DIAGNOSIS — C50311 Malignant neoplasm of lower-inner quadrant of right female breast: Secondary | ICD-10-CM | POA: Diagnosis not present

## 2023-01-18 DIAGNOSIS — Z510 Encounter for antineoplastic radiation therapy: Secondary | ICD-10-CM | POA: Diagnosis not present

## 2023-01-18 LAB — RAD ONC ARIA SESSION SUMMARY
Course Elapsed Days: 20
Plan Fractions Treated to Date: 13
Plan Prescribed Dose Per Fraction: 2.66 Gy
Plan Total Fractions Prescribed: 16
Plan Total Prescribed Dose: 42.56 Gy
Reference Point Dosage Given to Date: 34.58 Gy
Reference Point Session Dosage Given: 2.66 Gy
Session Number: 13

## 2023-01-19 ENCOUNTER — Ambulatory Visit
Admission: RE | Admit: 2023-01-19 | Discharge: 2023-01-19 | Disposition: A | Discharge status: Home / Self Care | Payer: PPO | Source: Ambulatory Visit | Attending: Radiation Oncology | Admitting: Radiation Oncology

## 2023-01-19 ENCOUNTER — Other Ambulatory Visit: Payer: Self-pay

## 2023-01-19 DIAGNOSIS — Z170 Estrogen receptor positive status [ER+]: Secondary | ICD-10-CM | POA: Diagnosis not present

## 2023-01-19 DIAGNOSIS — C50311 Malignant neoplasm of lower-inner quadrant of right female breast: Secondary | ICD-10-CM | POA: Diagnosis not present

## 2023-01-19 DIAGNOSIS — Z510 Encounter for antineoplastic radiation therapy: Secondary | ICD-10-CM | POA: Diagnosis not present

## 2023-01-19 LAB — RAD ONC ARIA SESSION SUMMARY
Course Elapsed Days: 21
Plan Fractions Treated to Date: 14
Plan Prescribed Dose Per Fraction: 2.66 Gy
Plan Total Fractions Prescribed: 16
Plan Total Prescribed Dose: 42.56 Gy
Reference Point Dosage Given to Date: 37.24 Gy
Reference Point Session Dosage Given: 2.66 Gy
Session Number: 14

## 2023-01-20 ENCOUNTER — Ambulatory Visit
Admission: RE | Admit: 2023-01-20 | Discharge: 2023-01-20 | Disposition: A | Discharge status: Home / Self Care | Payer: PPO | Source: Ambulatory Visit | Attending: Radiation Oncology | Admitting: Radiation Oncology

## 2023-01-20 ENCOUNTER — Other Ambulatory Visit: Payer: Self-pay

## 2023-01-20 DIAGNOSIS — Z170 Estrogen receptor positive status [ER+]: Secondary | ICD-10-CM | POA: Diagnosis not present

## 2023-01-20 DIAGNOSIS — Z510 Encounter for antineoplastic radiation therapy: Secondary | ICD-10-CM | POA: Diagnosis not present

## 2023-01-20 DIAGNOSIS — C50311 Malignant neoplasm of lower-inner quadrant of right female breast: Secondary | ICD-10-CM | POA: Diagnosis not present

## 2023-01-20 LAB — RAD ONC ARIA SESSION SUMMARY
Course Elapsed Days: 22
Plan Fractions Treated to Date: 15
Plan Prescribed Dose Per Fraction: 2.66 Gy
Plan Total Fractions Prescribed: 16
Plan Total Prescribed Dose: 42.56 Gy
Reference Point Dosage Given to Date: 39.9 Gy
Reference Point Session Dosage Given: 2.66 Gy
Session Number: 15

## 2023-01-21 ENCOUNTER — Ambulatory Visit
Admission: RE | Admit: 2023-01-21 | Discharge: 2023-01-21 | Disposition: A | Discharge status: Home / Self Care | Payer: PPO | Source: Ambulatory Visit | Attending: Radiation Oncology | Admitting: Radiation Oncology

## 2023-01-21 ENCOUNTER — Other Ambulatory Visit: Payer: Self-pay

## 2023-01-21 DIAGNOSIS — C50311 Malignant neoplasm of lower-inner quadrant of right female breast: Secondary | ICD-10-CM | POA: Diagnosis not present

## 2023-01-21 DIAGNOSIS — Z510 Encounter for antineoplastic radiation therapy: Secondary | ICD-10-CM | POA: Diagnosis not present

## 2023-01-21 DIAGNOSIS — Z170 Estrogen receptor positive status [ER+]: Secondary | ICD-10-CM | POA: Diagnosis not present

## 2023-01-21 LAB — RAD ONC ARIA SESSION SUMMARY
Course Elapsed Days: 23
Plan Fractions Treated to Date: 16
Plan Prescribed Dose Per Fraction: 2.66 Gy
Plan Total Fractions Prescribed: 16
Plan Total Prescribed Dose: 42.56 Gy
Reference Point Dosage Given to Date: 42.56 Gy
Reference Point Session Dosage Given: 2.66 Gy
Session Number: 16

## 2023-01-24 ENCOUNTER — Other Ambulatory Visit: Payer: Self-pay

## 2023-01-24 ENCOUNTER — Ambulatory Visit: Payer: PPO

## 2023-01-24 DIAGNOSIS — Z170 Estrogen receptor positive status [ER+]: Secondary | ICD-10-CM | POA: Diagnosis not present

## 2023-01-24 DIAGNOSIS — R293 Abnormal posture: Secondary | ICD-10-CM | POA: Diagnosis not present

## 2023-01-24 DIAGNOSIS — M25611 Stiffness of right shoulder, not elsewhere classified: Secondary | ICD-10-CM

## 2023-01-24 DIAGNOSIS — C50311 Malignant neoplasm of lower-inner quadrant of right female breast: Secondary | ICD-10-CM | POA: Diagnosis not present

## 2023-01-24 DIAGNOSIS — Z510 Encounter for antineoplastic radiation therapy: Secondary | ICD-10-CM | POA: Diagnosis not present

## 2023-01-24 DIAGNOSIS — Z483 Aftercare following surgery for neoplasm: Secondary | ICD-10-CM

## 2023-01-24 LAB — RAD ONC ARIA SESSION SUMMARY
Course Elapsed Days: 26
Plan Fractions Treated to Date: 1
Plan Prescribed Dose Per Fraction: 2 Gy
Plan Total Fractions Prescribed: 4
Plan Total Prescribed Dose: 8 Gy
Reference Point Dosage Given to Date: 2 Gy
Reference Point Session Dosage Given: 2 Gy
Session Number: 17

## 2023-01-24 NOTE — Therapy (Signed)
OUTPATIENT PHYSICAL THERAPY BREAST TREATMENT   Patient Name: [name] [name] MRN: [id number] DOB:04/21/1951, 72 y.o., female Today's Date: 01/24/2023  END OF SESSION:  PT End of Session - 01/24/23 1321     Visit Number 7    PT Start Time 1232    PT Stop Time 1315    PT Time Calculation (min) 43 min    Activity Tolerance Patient tolerated treatment well    Behavior During Therapy WFL for tasks assessed/performed                  Past Medical History:  Diagnosis Date   ADHD (attention deficit hyperactivity disorder)    Anemia    Anxiety    Arthritis    Depression    GERD (gastroesophageal reflux disease)    History of panic attacks    Hyperlipidemia    Hypertension    Hypothyroidism, postsurgical    Pre-diabetes    SBO (small bowel obstruction) (HCC) 01/15/2020   Sleep apnea    SUI (stress urinary incontinence, female)    Wears glasses    Past Surgical History:  Procedure Laterality Date   BREAST BIOPSY Right 10/07/2022   BREAST BIOPSY Right 10/07/2022   [place] RT BREAST BX W LOC DEV 1ST LESION IMG BX SPEC US GUIDE 10/07/2022 [facility]   BREAST BIOPSY  11/18/2022   MM RT RADIOACTIVE SEED LOC MAMMO GUIDE 11/18/2022 [facility]   BREAST LUMPECTOMY WITH RADIOACTIVE SEED AND SENTINEL LYMPH NODE BIOPSY Right 11/19/2022   Procedure: RIGHT BREAST LUMPECTOMY WITH RADIOACTIVE SEED;  Surgeon: [name], MD;  Location: [facility] OR;  Service: General;  Laterality: Right;   CATARACT EXTRACTION Bilateral    2023   D & C HYSTEROSCOPY W/ RESECTION ENDOMETRIAL POLYP  04/15/2009   D & C HYSTEROSCOPY W/ RESECTOSCOPE AND THERMACHOICE ENDOMETRIAL ABLATION  12/09/2010   EXCISION MELANOMA WITH SENTINEL LYMPH NODE BIOPSY Right 11/19/2022   Procedure: SENTINEL LYMPH NODE BIOPSY;  Surgeon: [name], MD;  Location: [facility] OR;  Service: General;  Laterality: Right;   LAPAROSCOPIC ASSISTED VENTRAL HERNIA REPAIR Left 01/16/2020   Procedure: LAPAROSCOPIC ASSISTED HERNIA REPAIR;   Surgeon: Berna Bue, MD;  Location: [facility] OR;  Service: General;  Laterality: Left;   PUBOVAGINAL SLING N/A 07/26/2016   Procedure: PUBO-VAGINAL SLING ALTIS SLING;  Surgeon: Jethro Bolus, MD;  Location: [facility] [place];  Service: Urology;  Laterality: N/A;   REPAIR SPIGELIAN HERNIA     RESECTION GIANT CELL TUMOR RIGHT LONG FINGER  09/13/2008   REVERSE SHOULDER ARTHROPLASTY Right 07/16/2021   Procedure: REVERSE SHOULDER ARTHROPLASTY;  Surgeon: Jones Broom, MD;  Location: [facility] OR[facility];  Service: Orthopedics;  Laterality: Right;   ROTATOR CUFF REPAIR Bilateral    THYROIDECTOMY  1979    [facility]   non-malignant diseased thyroid   THYROIDECTOMY Left 03/17/2021   Procedure: COMPLETION THYROIDECTOMY, LEFT LOBE;  Surgeon: Darnell Level, MD;  Location: [facility];  Service: General;  Laterality: Left;   TOTAL KNEE ARTHROPLASTY Right 12/17/2019   Procedure: RIGHT TOTAL KNEE ARTHROPLASTY;  Surgeon: Gean Birchwood, MD;  Location: [facility];  Service: Orthopedics;  Laterality: Right;   TOTAL KNEE ARTHROPLASTY Left 03/03/2020   Procedure: LEFT TOTAL KNEE ARTHROPLASTY;  Surgeon: Gean Birchwood, MD;  Location: [facility];  Service: Orthopedics;  Laterality: Left;   TRIPLE ARTHRODESIS LEFT FOOT  04/20/2007   VENTRAL HERNIA REPAIR  2003   WRIST GANGLION EXCISION Right 1975   Patient Active Problem List   Diagnosis Date Noted  Genetic testing 11/01/2022   Family history of breast cancer 10/21/2022   Malignant neoplasm of lower-inner quadrant of right breast of female, estrogen receptor positive (HCC) 10/18/2022   Neoplasm of uncertain behavior of thyroid gland 03/15/2021   Left thyroid nodule 03/15/2021   S/P TKR (total knee replacement) using cement, left 03/03/2020   Degenerative arthritis of left knee 02/29/2020   SBO (small bowel obstruction) (HCC) 01/15/2020   S/P TKR (total knee replacement), right 12/19/2019   Total knee replacement status, right 12/17/2019   Osteoarthritis of right knee  12/14/2019   Obesity, morbid (HCC) 12/16/2016   Strain of iliopsoas muscle, initial encounter 07/15/2016   Elevated cholesterol    Urinary incontinence    Endometrial polyp    Primary osteoarthritis of knees, bilateral 05/15/2009   DERANGEMENT OF ANTERIOR HORN OF LATERAL MENISCUS 05/15/2009   KNEE PAIN 05/15/2009    REFERRING PROVIDER: Dr. [name]  REFERRING DIAG: Right breast cancer  THERAPY DIAG:  Abnormal posture  Stiffness of right shoulder, not elsewhere classified  Aftercare following surgery for neoplasm  Rationale for Evaluation and Treatment: Rehabilitation  ONSET DATE: 11/19/2022  SUBJECTIVE:                                                                                                                                                                                           SUBJECTIVE STATEMENT: I am doing my exercises as I am able. It is painful to do the pulleys, my armpit is red.   From evaluation: Patient reports she underwent a right lumpectomy and sentinel node biopsy on 11/19/2022. The pathology showed the sentinel node biopsy did not contain any lymph node tissue and the tissue was benign. Her final pathology differed from her original biopsy results which showed it to be ER/PR positive and HER2 negative with a Ki67 of 2%. Initially her biopsy stated she was triple positive. Her Oncotype score is low but she will proceed to radiation and anti-estrogen therapy. She was diagnosed with an infection in her right breast and has been on antibiotics which ended 12/12/2022. She reports right shoulder dysfunction since her shoulder replacement 07/16/2021.  PERTINENT HISTORY:  Patient was diagnosed on 09/21/2022 with right grade 2 invasive ductal carcinoma breast cancer. She underwent a right lumpectomy and sentinel node biopsy on 11/19/2022 but there as not nodal tissue in the sentinel node biopsy. Final pathology was ER/PR positive and HER2 negative with a Ki67 of 2%. Hx  left rotator cuff repair 08/05/20. Pins placed in left foot 2003. Hx of right shoulder replacement 07/16/2021.   PATIENT GOALS:  Reassess how my recovery is going related to arm  function, pain, and swelling.  PAIN:  Are you having pain? No  PRECAUTIONS: Recent Surgery, right UE Lymphedema risk, Other: previous right shoulder replacement 07/16/2021  ACTIVITY LEVEL / LEISURE: Not currently exercising but wants to get back to water exercise when her incision heals.   OBJECTIVE:   PATIENT SURVEYS:  QUICK DASH:  OBSERVATIONS: Right axillary incision and breast incisions are healing well. There is a slightly open area on her right breast incision (see photo in media). Messaged MD to make sure she does not need another antibiotics. No significant edema present in her breast. Right shoulder with significant compensatory techniques noted with flexion and abduction. Poor scapular mobility and shoulder hiking present.  POSTURE:  Forward head and rounded shoulders posture  LYMPHEDEMA ASSESSMENT:   UPPER EXTREMITY AROM/PROM:   A/PROM RIGHT   eval   Right 01/13/23 RIGHT 12/13/2022  Shoulder extension 41  48  Shoulder flexion 81 91 75  Shoulder abduction 60 85 63  Shoulder internal rotation NT due to limited abduction To L1 NT due to limited abduction  Shoulder external rotation NT due to limited abduction  NT due to limited abduction                          (Blank rows = not tested)   A/PROM LEFT   eval  Shoulder extension 52  Shoulder flexion 131  Shoulder abduction 159  Shoulder internal rotation 70  Shoulder external rotation 80                          (Blank rows = not tested)   CERVICAL AROM:     Percent limited at eval 10/20/2022 12/13/2022  Flexion WNL WNL  Extension 75% limited 75% limited  Right lateral flexion 75% limited 50% limited  Left lateral flexion 75% limited 50% limited  Right rotation 50% limited  50% limited  Left rotation 50% limited 50% limited       UPPER EXTREMITY STRENGTH: NT   LYMPHEDEMA ASSESSMENTS:    LANDMARK RIGHT   eval RIGHT 12/13/2022  10 cm proximal to olecranon process 41.3 43.2  Olecranon process 30.8 31.5  10 cm proximal to ulnar styloid process 25.6 25.1  Just proximal to ulnar styloid process 15.9 15.6  Across hand at thumb web space 19 19.5  At base of 2nd digit 5.8 5.8  (Blank rows = not tested)   LANDMARK LEFT   eval LEFT 12/13/2022  10 cm proximal to olecranon process 40.8 41.3  Olecranon process 30.2 29  10  cm proximal to ulnar styloid process 23.1 24.2  Just proximal to ulnar styloid process 15 15.4  Across hand at thumb web space 18.6 18.8  At base of 2nd digit 5.6 5.8  (Blank rows = not tested)    Surgery type/Date: 11/19/2022 Right lumpectomy and attempted sentinel node biopsy but no nodal tissue identified Number of lymph nodes removed: 0 Current/past treatment (chemo, radiation, hormone therapy): none Other symptoms:  Heaviness/tightness No Pain No Pitting edema No Infections Yes Decreased scar mobility Yes Stemmer sign No  Treatment:  Date: 01/24/23:  Rt axilla and breast assessed by Gwenevere Abbot, PT.  Pt had report of redness in her armpit and pain with ROM exercises.  Pt with inflamed skin and open blisters PT provided recommendations on how to manage this.  This included: no aggressive stretching, avoid pool until wounds closed and to follow MD recommendations on topical treatments.  Verbal review of all HEP and how to safely progress   Date: 01/17/23:  Discussion regarding exercises, expected progress and realistic expectations  UE ranger flexion on wall x10 Ball rolls forward x10, lateral x5 each Supine chest press: 3# with cane  Overhead flexion with cane with 3# added 2x10 Supine: narrow grip chest press: 3# 2x10 Finger ladder: flexion x 10  Date: 01/13/23:  Overhead pulleys: flexion and scaption x 3 min each, IR with pulleys x2 minutes Ball rolls forward x10, lateral x5  each Supine chest press: 3# with cane  Overhead flexion with cane with 3# added 2x10 Supine: narrow grip chest press: 3# 2x10  Date: 01/12/23:  Overhead pulleys: flexion and scaption x 3 min each Ball rolls forward x10, lateral x5 each 3 way raises x10 each bil  ER with red band x20 Doorway pec stretch x3 Sink lat stretch 2x20    PATIENT EDUCATION:  Education details: Access Code: [email or web address] Person educated: Patient Education method: Explanation, Demonstration, and Verbal cues Education comprehension: verbalized understanding and returned demonstration  HOME EXERCISE PROGRAM: Access Code: [email or web address] URL: [email or web address] Date: 01/13/2023 Prepared by: Tresa Endo  Exercises - Supine Chest Stretch with Elbows Bent  - 2 x daily - 7 x weekly - 5 reps - 5 hold - Standing Scapular Retraction  - 2 x daily - 7 x weekly - 10 reps - 2-3 hold - Standing Backward Shoulder Rolls  - 2 x daily - 7 x weekly - 10 reps - Doorway Pec Stretch at 90 Degrees Abduction  - 1 x daily - 7 x weekly - 2-3 reps - 20-30 hold - Standing Shoulder Flexion to 90 Degrees  - 1-2 x daily - 3 x weekly - 1 sets - 10 reps - 3 sec hold - Standing Shoulder Scaption  - 1-2 x daily - 3 x weekly - 1 sets - 10 reps - 3 sec hold - Shoulder Abduction - Thumbs Up  - 1-2 x daily - 3 x weekly - 1 sets - 10 reps - 3 sec hold - Seated Shoulder Flexion AAROM with Pulley Behind  - 2 x daily - 7 x weekly - 3 sets - 10 reps - Seated Shoulder Scaption AAROM with Pulley at Side  - 2 x daily - 7 x weekly - 3 sets - 10 reps - Standing Shoulder External Rotation with Resistance  - 2 x daily - 7 x weekly - 2 sets - 10 reps - Supine Shoulder Press AAROM in Abduction with Dowel  - 2 x daily - 7 x weekly - 1-2 sets - 10 reps - Supine Shoulder Flexion with Dowel  - 2 x daily - 7 x weekly - 1-2 sets - 10 reps - 5-10 hold   ASSESSMENT:  CLINICAL IMPRESSION: Session spent with assessment of red and blistered area of Rt axilla related  to radiation treatment.  Pt with 2 open areas and advised to reduce intensity of stretching to avoid further opening.  She was also advised to avoid the pool until wounds are closed.  PT reviewed all HEP with pt with instructions with how to modify given her axilla irritation and how to safely progress. A/ROM of Rt shoulder is improved since start of care. Pt verbalized understanding.   Pt will D/C for shoulder treatment today.    Pt will benefit from skilled therapeutic intervention to improve on the following deficits: Decreased knowledge of precautions, impaired UE functional use, pain, decreased ROM, postural dysfunction.   PT treatment/interventions: ADL/Self care  home management, Therapeutic exercises, Therapeutic activity, Patient/Family education, Self Care, and Re-evaluation   GOALS: Goals reviewed with patient? Yes  LONG TERM GOALS:  (STG=LTG)  GOALS Name Target Date  Goal status  1 Pt will demonstrate she has regained full shoulder ROM and function post operatively compared to baselines.  Baseline: 12/26/2022 IN PROGRESS  2 Patient will improve shoulder flexion to be able to reach into a shoulder height cabinet. 01/31/2023 (This is 4 weeks from beginning of treatment for shoulder issue) In progress  91 degrees flexion with scapular elevation (01/13/23)  3 Patient will improve shoulder abduction to reach out to the side out of her car window. 01/31/2023 (This is 4 weeks from beginning of treatment for shoulder issue) In progress  81 (01/13/23)  4 Patient will improve her DASH score to be </= 15 for improved overall function. 01/31/2023 (This is 4 weeks from beginning of treatment for shoulder issue) Not met     PLAN:  PT FREQUENCY/DURATION: 2x/week for 4 weeks beginning 01/03/2023  PLAN FOR NEXT SESSION: Rt shoulder strength, flexibility and endurance. PHYSICAL THERAPY DISCHARGE SUMMARY  Visits from Start of Care: 7  Current functional level related to goals / functional  outcomes: See above   Remaining deficits: Pt with limited Rt shoulder strength and A/ROM s/p total shoulder replacement.  Limited progress due to other co morbidities and current radiation treatment.  Pt should make progress with HEP advancement as able.     Education / Equipment: HEP, skin care in Rt axilla    Patient agrees to discharge. Patient goals were partially met. Patient is being discharged due to being pleased with the current functional level.   Lorrene Reid, PT 01/24/23 2:00 PM

## 2023-01-25 ENCOUNTER — Other Ambulatory Visit: Payer: Self-pay

## 2023-01-25 ENCOUNTER — Ambulatory Visit
Admission: RE | Admit: 2023-01-25 | Discharge: 2023-01-25 | Disposition: A | Discharge status: Home / Self Care | Payer: PPO | Source: Ambulatory Visit | Attending: Radiation Oncology | Admitting: Radiation Oncology

## 2023-01-25 DIAGNOSIS — Z510 Encounter for antineoplastic radiation therapy: Secondary | ICD-10-CM | POA: Diagnosis not present

## 2023-01-25 LAB — RAD ONC ARIA SESSION SUMMARY
Course Elapsed Days: 27
Plan Fractions Treated to Date: 2
Plan Prescribed Dose Per Fraction: 2 Gy
Plan Total Fractions Prescribed: 4
Plan Total Prescribed Dose: 8 Gy
Reference Point Dosage Given to Date: 4 Gy
Reference Point Session Dosage Given: 2 Gy
Session Number: 18

## 2023-01-26 ENCOUNTER — Ambulatory Visit: Payer: PPO

## 2023-01-26 ENCOUNTER — Ambulatory Visit
Admission: RE | Admit: 2023-01-26 | Discharge: 2023-01-26 | Disposition: A | Discharge status: Home / Self Care | Payer: PPO | Source: Ambulatory Visit | Attending: Radiation Oncology | Admitting: Radiation Oncology

## 2023-01-26 ENCOUNTER — Other Ambulatory Visit: Payer: Self-pay

## 2023-01-26 DIAGNOSIS — Z510 Encounter for antineoplastic radiation therapy: Secondary | ICD-10-CM | POA: Diagnosis not present

## 2023-01-26 LAB — RAD ONC ARIA SESSION SUMMARY
Course Elapsed Days: 28
Plan Fractions Treated to Date: 3
Plan Prescribed Dose Per Fraction: 2 Gy
Plan Total Fractions Prescribed: 4
Plan Total Prescribed Dose: 8 Gy
Reference Point Dosage Given to Date: 6 Gy
Reference Point Session Dosage Given: 2 Gy
Session Number: 19

## 2023-01-26 NOTE — Progress Notes (Signed)
Patient Care Team: Wilfrid Lund, PA as PCP - General (Family Medicine) [name], MD as Consulting Physician (General Surgery) Serena Croissant, MD as Consulting Physician (Hematology and Oncology) Dorothy Puffer, MD as Consulting Physician (Radiation Oncology) Donnelly Angelica, RN as Oncology Nurse Navigator Pershing Proud, RN as Oncology Nurse Navigator  DIAGNOSIS:  Encounter Diagnosis  Name Primary?   Malignant neoplasm of lower-inner quadrant of right breast of female, estrogen receptor positive (HCC) Yes    SUMMARY OF ONCOLOGIC HISTORY: Oncology History  Malignant neoplasm of lower-inner quadrant of right breast of female, estrogen receptor positive (HCC)  10/07/2022 Initial Diagnosis   Screening mammogram detected right breast mass LIQ 0.8 cm, axilla negative, biopsy revealed grade 2 IDC ER 100%, PR 100%, Ki67 5%, HER2 3+ positive   10/18/2022 Cancer Staging   Staging form: Breast, AJCC 8th Edition - Clinical stage from 10/18/2022: Stage IA (cT1b, cN0, cM0, G2, ER+, PR+, HER2+) - Signed by Ronny Bacon, PA-C on 10/18/2022 Stage prefix: Initial diagnosis Method of lymph node assessment: Clinical Histologic grading system: 3 grade system    Genetic Testing   [facility] Common Cancer Panel+RNA was Negative. Of note, a variant of uncertain significance was detected in the AXIN2 gene (c.1481C>T) and the TSC2 gene (c.4794C>A). Report date is 10/28/2022.  The Common Hereditary Cancers Panel offered by [facility] includes sequencing and/or deletion duplication testing of the following 48 genes: APC, ATM, AXIN2, BAP1, BARD1, BMPR1A, BRCA1, BRCA2, BRIP1, CDH1, CDK4, CDKN2A (p14ARF and p16INK4a only), CHEK2, CTNNA1, DICER1, EPCAM (Deletion/duplication testing only), FH, GREM1 (promoter region duplication testing only), HOXB13, KIT, MBD4, MEN1, MLH1, MSH2, MSH3, MSH6, MUTYH, NF1, NHTL1, PALB2, PDGFRA, PMS2, POLD1, POLE, PTEN, RAD51C, RAD51D, SDHA (sequencing analysis only except exon  14), SDHB, SDHC, SDHD, SMAD4, SMARCA4. STK11, TP53, TSC1, TSC2, and VHL.   11/19/2022 Surgery   Right lumpectomy: Grade 1 IDC 1.9 cm with intermediate grade DCIS, margins negative, ALH, sentinel lymph node: Benign fibroconnective tissue no lymph node identified.  ER 100%, PR 100%, HER2 3+ positive, Ki-67 5%   12/16/2022 Cancer Staging   Staging form: Breast, AJCC 8th Edition - Pathologic stage from 12/16/2022: Stage IA (pT1c, pN0(sn), cM0, G1, ER+, PR+, HER2-, Oncotype DX score: 15) - Signed by Ronny Bacon, PA-C on 12/16/2022 Stage prefix: Initial diagnosis Method of lymph node assessment: Sentinel lymph node biopsy Multigene prognostic tests performed: Oncotype DX Recurrence score range: Greater than or equal to 11 Histologic grading system: 3 grade system     CHIEF COMPLIANT: Follow-up after radiation  INTERVAL HISTORY: [name] [name] is a 72 y.o. female is here because of recent diagnosis of right breast cancer. She presents to the clinic for a follow-up. She reports that she is having it rough due to the radiation dermatitis. She says she is in a lot of pain from it. She states that she is sleeping a lot. Tenderness in the nipple.   ALLERGIES:  is allergic to ceclor [cefaclor], bactrim [sulfamethoxazole-trimethoprim], and gabapentin.  MEDICATIONS:  Current Outpatient Medications  Medication Sig Dispense Refill   ALPRAZolam (XANAX) 0.5 MG tablet Take 0.5 mg by mouth daily as needed for anxiety.     anastrozole (ARIMIDEX) 1 MG tablet Take 1 tablet (1 mg total) by mouth daily. 90 tablet 3   buPROPion (WELLBUTRIN XL) 150 MG 24 hr tablet Take 150 mg by mouth daily.     CALCIUM PO Take 4 tablets by mouth daily. Mini Calcium Total dose 1200 mg     Cholecalciferol (  VITAMIN D3) 125 MCG (5000 UT) TABS Take 5,000 Units by mouth daily.     DULoxetine (CYMBALTA) 30 MG capsule Take 30 mg by mouth daily.     DULoxetine (CYMBALTA) 60 MG capsule Take 60 mg by mouth in the morning.  2   ferrous  sulfate 325 (65 FE) MG tablet Take 325 mg by mouth.     ibuprofen (ADVIL) 200 MG tablet Take 400-600 mg by mouth every 8 (eight) hours as needed for moderate pain.     irbesartan (AVAPRO) 75 MG tablet Take 75 mg by mouth in the morning.     lamoTRIgine (LAMICTAL) 25 MG tablet Take 25 mg by mouth 2 (two) times daily.     levothyroxine (SYNTHROID) 137 MCG tablet Take 137 mcg by mouth daily before breakfast.     lisdexamfetamine (VYVANSE) 50 MG capsule Take 50 mg by mouth in the morning.     Multiple Vitamin (MULTIVITAMIN) capsule Take 2 capsules by mouth daily.     MYRBETRIQ 50 MG TB24 tablet Take 50 mg by mouth in the morning.     nystatin cream (MYCOSTATIN) Apply 1/2" cream to base of left base of neck and in the fold below the breast until rash resolves 30 g 0   omeprazole (PRILOSEC) 40 MG capsule Take 20 mg by mouth daily.     oxyCODONE-acetaminophen (PERCOCET/ROXICET) 5-325 MG tablet Take 1 tablet by mouth every 8 (eight) hours as needed for severe pain. 20 tablet 0   silver sulfADIAZINE (SILVADENE) 1 % cream Apply 1 Application topically daily. 50 g 0   simvastatin (ZOCOR) 40 MG tablet Take 40 mg by mouth in the morning.  2   vitamin k 100 MCG tablet Take 100 mcg by mouth daily.     Zoster Vaccine Adjuvanted [facility]) injection      No current facility-administered medications for this visit.    PHYSICAL EXAMINATION: ECOG PERFORMANCE STATUS: 1 - Symptomatic but completely ambulatory  Vitals:   02/03/23 1107  BP: (!) 146/116  Pulse: 87  Resp: 18  Temp: (!) 97.5 F (36.4 C)  SpO2: 96%   Filed Weights   02/03/23 1107  Weight: 294 lb 9.6 oz (133.6 kg)      LABORATORY DATA:  I have reviewed the data as listed    Latest Ref Rng & Units 10/20/2022   12:18 PM 03/18/2021    4:14 AM 03/09/2021   11:55 AM  CMP  Glucose 70 - 99 mg/dL 92  161  92   BUN 8 - 23 mg/dL 17  16  15    Creatinine 0.44 - 1.00 mg/dL 0.96  0.45  4.09   Sodium 135 - 145 mmol/L 141  140  140   Potassium 3.5 -  5.1 mmol/L 4.0  4.1  4.3   Chloride 98 - 111 mmol/L 106  106  106   CO2 22 - 32 mmol/L 28  28  25    Calcium 8.9 - 10.3 mg/dL 9.0  8.0  9.5   Total Protein 6.5 - 8.1 g/dL 7.1     Total Bilirubin 0.3 - 1.2 mg/dL 0.4     Alkaline Phos 38 - 126 U/L 90     AST 15 - 41 U/L 15     ALT 0 - 44 U/L 14       Lab Results  Component Value Date   WBC 7.6 10/20/2022   HGB 12.5 10/20/2022   HCT 39.6 10/20/2022   MCV 86.7 10/20/2022   PLT  350 10/20/2022   NEUTROABS 4.5 10/20/2022    ASSESSMENT & PLAN:  Malignant neoplasm of lower-inner quadrant of right breast of female, estrogen receptor positive (HCC) 11/19/2022: Right lumpectomy: Grade 1 IDC 1.9 cm with intermediate grade DCIS, margins negative, ALH, sentinel lymph node: Benign fibroconnective tissue no lymph node identified.  ER 100%, PR 100%, HER2 negative, Ki-67 5%  Oncotype DX score 15 (risk of distant recurrence 4%)  01/27/2023: Completed radiation  Current treatment: Anastrozole 1 mg daily started 02/03/2023  Profound radiation dermatitis with skin excoriation: She is in a lot of pain.  I sent a prescription for Percocets to be taken as needed. She is going to a beach vacation starting tomorrow.  Anastrozole counseling: We discussed the risks and benefits of anti-estrogen therapy with aromatase inhibitors. These include but not limited to insomnia, hot flashes, mood changes, vaginal dryness, bone density loss, and weight gain. We strongly believe that the benefits far outweigh the risks. Patient understands these risks and consented to starting treatment. Planned treatment duration is 5-7 years.  Return to clinic in 3 months for survivorship care plan visit    No orders of the defined types were placed in this encounter.  The patient has a good understanding of the overall plan. she agrees with it. she will call with any problems that may develop before the next visit here. Total time spent: 30 mins including face to face time and time  spent for planning, charting and co-ordination of care   Tamsen Meek, MD 02/03/23    I Janan Ridge am acting as a Neurosurgeon for [employer]  I have reviewed the above documentation for accuracy and completeness, and I agree with the above.

## 2023-01-27 ENCOUNTER — Other Ambulatory Visit: Payer: Self-pay

## 2023-01-27 ENCOUNTER — Ambulatory Visit
Admission: RE | Admit: 2023-01-27 | Discharge: 2023-01-27 | Disposition: A | Discharge status: Home / Self Care | Payer: PPO | Source: Ambulatory Visit | Attending: Radiation Oncology | Admitting: Radiation Oncology

## 2023-01-27 ENCOUNTER — Ambulatory Visit: Payer: PPO

## 2023-01-27 DIAGNOSIS — Z170 Estrogen receptor positive status [ER+]: Secondary | ICD-10-CM | POA: Diagnosis not present

## 2023-01-27 DIAGNOSIS — C50311 Malignant neoplasm of lower-inner quadrant of right female breast: Secondary | ICD-10-CM | POA: Diagnosis not present

## 2023-01-27 DIAGNOSIS — Z510 Encounter for antineoplastic radiation therapy: Secondary | ICD-10-CM | POA: Diagnosis not present

## 2023-01-27 LAB — RAD ONC ARIA SESSION SUMMARY
Course Elapsed Days: 29
Plan Fractions Treated to Date: 4
Plan Prescribed Dose Per Fraction: 2 Gy
Plan Total Fractions Prescribed: 4
Plan Total Prescribed Dose: 8 Gy
Reference Point Dosage Given to Date: 8 Gy
Reference Point Session Dosage Given: 2 Gy
Session Number: 20

## 2023-01-29 DIAGNOSIS — G4733 Obstructive sleep apnea (adult) (pediatric): Secondary | ICD-10-CM | POA: Diagnosis not present

## 2023-01-29 DIAGNOSIS — R400 Somnolence: Secondary | ICD-10-CM | POA: Diagnosis not present

## 2023-01-31 ENCOUNTER — Encounter: Payer: Self-pay | Admitting: *Deleted

## 2023-01-31 ENCOUNTER — Telehealth: Payer: Self-pay | Admitting: Radiation Oncology

## 2023-01-31 MED ORDER — SILVER SULFADIAZINE 1 % EX CREA: 1.0000 | TOPICAL_CREAM | Freq: Every day | CUTANEOUS | 0 refills | Status: DC

## 2023-01-31 NOTE — Telephone Encounter (Signed)
I left a voicemail in response to pictures the pt sent in showing what appears to be dry desquamation in her right underarm and dermatiits along the upper and lower breast. She has a sulfa allergy to bactrim and the description is swelling. I left a long detailed message about how if her prior history of swelling was related to her airway that she should NOT fill the prescription for silvadene that I'd be willing to send in. If her reaction was a mild, local change, it might be worth considering silvadene. I asked her to let us know how she'd like to proceed, but gave her the choice to reconsider using a sulfa based cream.

## 2023-02-01 ENCOUNTER — Ambulatory Visit
Admission: RE | Admit: 2023-02-01 | Discharge: 2023-02-01 | Disposition: A | Discharge status: Home / Self Care | Payer: PPO | Source: Ambulatory Visit | Attending: Radiation Oncology | Admitting: Radiation Oncology

## 2023-02-01 ENCOUNTER — Telehealth: Payer: Self-pay | Admitting: Adult Health

## 2023-02-01 ENCOUNTER — Encounter: Payer: Self-pay | Admitting: Radiation Oncology

## 2023-02-01 VITALS — BP 98/47 | HR 75 | Temp 97.9°F | Resp 20 | Ht 63.0 in | Wt 298.0 lb

## 2023-02-01 DIAGNOSIS — Z923 Personal history of irradiation: Secondary | ICD-10-CM | POA: Insufficient documentation

## 2023-02-01 DIAGNOSIS — Z170 Estrogen receptor positive status [ER+]: Secondary | ICD-10-CM | POA: Diagnosis not present

## 2023-02-01 DIAGNOSIS — B372 Candidiasis of skin and nail: Secondary | ICD-10-CM | POA: Insufficient documentation

## 2023-02-01 DIAGNOSIS — C50311 Malignant neoplasm of lower-inner quadrant of right female breast: Secondary | ICD-10-CM | POA: Diagnosis not present

## 2023-02-01 DIAGNOSIS — L309 Dermatitis, unspecified: Secondary | ICD-10-CM | POA: Insufficient documentation

## 2023-02-01 MED ORDER — NYSTATIN 100000 UNIT/GM EX CREA: TOPICAL_CREAM | CUTANEOUS | 0 refills | Status: AC

## 2023-02-01 MED ORDER — SILVER SULFADIAZINE 1 % EX CREA: TOPICAL_CREAM | Freq: Once | CUTANEOUS | Status: AC

## 2023-02-01 NOTE — Addendum Note (Signed)
Encounter addended by: Sondra Come, RN on: 02/01/2023 3:31 PM  Actions taken: Order list changed, Diagnosis association updated

## 2023-02-01 NOTE — Progress Notes (Signed)
  Radiation Oncology         [phone] ________________________________  Name: [name] [name] MRN: [id number]  Date of Service: 02/01/2023  DOB: 06-13-1951  Post Treatment Telephone Note  Diagnosis:    Stage IA, pT1cN0M0, grade 1, ER/PR positive invasive ductal carcinoma of the right breast   The patient completed right whole breast radiation on 01/27/23. She received 50.56 Gy to the breast including her boost.   She developed progressive skin irritation in the last week since completing her treatment in the area of the axilla.   Exam: Wt Readings from Last 3 Encounters:  02/01/23 298 lb (135.2 kg)  12/16/22 289 lb 4 oz (131.2 kg)  11/30/22 290 lb (131.5 kg)   Temp Readings from Last 3 Encounters:  02/01/23 97.9 F (36.6 C)  12/16/22 (!) 96.8 F (36 C) (Temporal)  11/30/22 (!) 97.5 F (36.4 C) (Temporal)   BP Readings from Last 3 Encounters:  02/01/23 (!) 98/47  12/16/22 (!) 146/65  11/30/22 (!) 140/61   Pulse Readings from Last 3 Encounters:  02/01/23 75  12/16/22 72  11/30/22 89  She has erythema of the upper central right breast consistent with dermatitis. She has candida like changes at the base of the left neck fold, and right inframammary fold. In the right axilla, there is evidence of dry desquamation with cell islands in the sites as seen below.     Impression/Plan: 1.  Stage IA, pT1cN0M0, grade 1, ER/PR positive invasive ductal carcinoma of the right breast . The patient has been doing well since completion of radiotherapy but we discussed the use of hydrogel and provided these sheets to her. We again revisited the options of silvadene as long as she is able to tolerate beginning this. She would place this twice daily in the desquamation sites of the right axilla. She will also use the hydrogel throughout the day when she does not have the silvadene in that area. She also will use nystatin cream at the base of her left neck, and right inframammary fold. She will  send in another picture of the area via mychart on Thursday.    We discussed that we would be happy to continue to follow her as needed, but she will also continue to follow up with Dr. Pamelia Hoit in medical oncology. She was counseled on skin care as well as measures to avoid sun exposure to this area.         Osker Mason, PAC

## 2023-02-01 NOTE — Radiation Completion Notes (Signed)
  Radiation Oncology         [phone] ________________________________  Name: [name] [name] MRN: [id number]  Date of Service: 02/01/2023  DOB: Oct 11, 1950  End of Treatment Note   Diagnosis: Stage IA, pT1cN0M0, grade 1, ER/PR positive invasive ductal carcinoma of the right breast   Intent: Curative     ==========DELIVERED PLANS==========  First Treatment Date: 2022-12-29 - Last Treatment Date: 2023-01-27   Plan Name: Breast_R Site: Breast, Right Technique: 3D Mode: Photon Dose Per Fraction: 2.66 Gy Prescribed Dose (Delivered / Prescribed): 42.56 Gy / 42.56 Gy Prescribed Fxs (Delivered / Prescribed): 16 / 16   Plan Name: Breast_R_Bst Site: Breast, Right Technique: 3D Mode: Photon Dose Per Fraction: 2 Gy Prescribed Dose (Delivered / Prescribed): 8 Gy / 8 Gy Prescribed Fxs (Delivered / Prescribed): 4 / 4     ==========ON TREATMENT VISIT DATES========== 2022-12-31, 2023-01-07, 2023-01-14, 2023-01-21    See weekly On Treatment Notes in Epic for details. The patient tolerated radiation. She developed fatigue and anticipated skin changes in the treatment field.   The patient will receive a call in about one month from the radiation oncology department. She will continue follow up with Dr. Pamelia Hoit as well.      Osker Mason, PAC

## 2023-02-01 NOTE — Addendum Note (Signed)
Encounter addended by: Sondra Come, RN on: 02/01/2023 3:32 PM  Actions taken: MAR administration accepted

## 2023-02-01 NOTE — Telephone Encounter (Signed)
Scheduled appointment per 6/18 los. Patient is aware.

## 2023-02-02 NOTE — Addendum Note (Signed)
Encounter addended by: Ronny Bacon, PA-C on: 02/02/2023 11:48 AM  Actions taken: Clinical Note Signed

## 2023-02-03 ENCOUNTER — Ambulatory Visit: Payer: PPO | Admitting: Hematology and Oncology

## 2023-02-03 ENCOUNTER — Inpatient Hospital Stay: Payer: PPO | Attending: Nurse Practitioner | Admitting: Hematology and Oncology

## 2023-02-03 ENCOUNTER — Other Ambulatory Visit: Payer: Self-pay

## 2023-02-03 VITALS — BP 146/116 | HR 87 | Temp 97.5°F | Resp 18 | Ht 63.0 in | Wt 294.6 lb

## 2023-02-03 DIAGNOSIS — Z79811 Long term (current) use of aromatase inhibitors: Secondary | ICD-10-CM | POA: Diagnosis not present

## 2023-02-03 DIAGNOSIS — Z170 Estrogen receptor positive status [ER+]: Secondary | ICD-10-CM | POA: Diagnosis not present

## 2023-02-03 DIAGNOSIS — C50311 Malignant neoplasm of lower-inner quadrant of right female breast: Secondary | ICD-10-CM | POA: Diagnosis not present

## 2023-02-03 MED ORDER — OXYCODONE-ACETAMINOPHEN 5-325 MG PO TABS: 1.0000 | ORAL_TABLET | Freq: Three times a day (TID) | ORAL | 0 refills | Status: DC | PRN

## 2023-02-03 MED ORDER — ANASTROZOLE 1 MG PO TABS: 1.0000 mg | ORAL_TABLET | Freq: Every day | ORAL | 3 refills | Status: DC

## 2023-02-03 NOTE — Assessment & Plan Note (Addendum)
11/19/2022: Right lumpectomy: Grade 1 IDC 1.9 cm with intermediate grade DCIS, margins negative, ALH, sentinel lymph node: Benign fibroconnective tissue no lymph node identified.  ER 100%, PR 100%, HER2 negative, Ki-67 5%  Oncotype DX score 15 (risk of distant recurrence 4%)  01/27/2023: Completed radiation  Current treatment: Anastrozole 1 mg daily started 02/03/2023  Profound radiation dermatitis with skin excoriation: She is in a lot of pain.  I sent a prescription for Percocets to be taken as needed. She is going to a beach vacation starting tomorrow.  Anastrozole counseling: We discussed the risks and benefits of anti-estrogen therapy with aromatase inhibitors. These include but not limited to insomnia, hot flashes, mood changes, vaginal dryness, bone density loss, and weight gain. We strongly believe that the benefits far outweigh the risks. Patient understands these risks and consented to starting treatment. Planned treatment duration is 5-7 years.  Return to clinic in 3 months for survivorship care plan visit

## 2023-02-04 DIAGNOSIS — R400 Somnolence: Secondary | ICD-10-CM | POA: Diagnosis not present

## 2023-02-04 DIAGNOSIS — G4733 Obstructive sleep apnea (adult) (pediatric): Secondary | ICD-10-CM | POA: Diagnosis not present

## 2023-02-07 ENCOUNTER — Telehealth: Payer: Self-pay | Admitting: *Deleted

## 2023-02-07 ENCOUNTER — Encounter: Payer: Self-pay | Admitting: *Deleted

## 2023-02-07 NOTE — Telephone Encounter (Signed)
Spoke with the patient to see how her skin was since we saw her last week.  She reports her skin is doing so much better and is well on its way to being healed.  She reports a fresh layer of skin has started to form and she is pleased with this. She states she will upload a photo to her mychart for our review.  She was very appreciative of the call and will reach out with any other concerns.  Lind Covert RN, BSN

## 2023-02-14 DIAGNOSIS — F330 Major depressive disorder, recurrent, mild: Secondary | ICD-10-CM | POA: Diagnosis not present

## 2023-02-14 DIAGNOSIS — F419 Anxiety disorder, unspecified: Secondary | ICD-10-CM | POA: Diagnosis not present

## 2023-02-14 DIAGNOSIS — F900 Attention-deficit hyperactivity disorder, predominantly inattentive type: Secondary | ICD-10-CM | POA: Diagnosis not present

## 2023-02-28 DIAGNOSIS — G4733 Obstructive sleep apnea (adult) (pediatric): Secondary | ICD-10-CM | POA: Diagnosis not present

## 2023-02-28 DIAGNOSIS — R400 Somnolence: Secondary | ICD-10-CM | POA: Diagnosis not present

## 2023-03-07 DIAGNOSIS — E559 Vitamin D deficiency, unspecified: Secondary | ICD-10-CM | POA: Diagnosis not present

## 2023-03-07 DIAGNOSIS — F419 Anxiety disorder, unspecified: Secondary | ICD-10-CM | POA: Diagnosis not present

## 2023-03-07 DIAGNOSIS — G8929 Other chronic pain: Secondary | ICD-10-CM | POA: Diagnosis not present

## 2023-03-07 DIAGNOSIS — G4733 Obstructive sleep apnea (adult) (pediatric): Secondary | ICD-10-CM | POA: Diagnosis not present

## 2023-03-07 DIAGNOSIS — H9193 Unspecified hearing loss, bilateral: Secondary | ICD-10-CM | POA: Diagnosis not present

## 2023-03-07 DIAGNOSIS — E039 Hypothyroidism, unspecified: Secondary | ICD-10-CM | POA: Diagnosis not present

## 2023-03-07 DIAGNOSIS — F909 Attention-deficit hyperactivity disorder, unspecified type: Secondary | ICD-10-CM | POA: Diagnosis not present

## 2023-03-07 DIAGNOSIS — F330 Major depressive disorder, recurrent, mild: Secondary | ICD-10-CM | POA: Diagnosis not present

## 2023-03-07 DIAGNOSIS — E785 Hyperlipidemia, unspecified: Secondary | ICD-10-CM | POA: Diagnosis not present

## 2023-03-07 DIAGNOSIS — F4310 Post-traumatic stress disorder, unspecified: Secondary | ICD-10-CM | POA: Diagnosis not present

## 2023-03-07 DIAGNOSIS — C50919 Malignant neoplasm of unspecified site of unspecified female breast: Secondary | ICD-10-CM | POA: Diagnosis not present

## 2023-03-14 ENCOUNTER — Ambulatory Visit: Payer: PPO | Attending: General Surgery | Admitting: Physical Therapy

## 2023-03-14 ENCOUNTER — Ambulatory Visit: Payer: PPO | Attending: Hematology and Oncology

## 2023-03-14 ENCOUNTER — Other Ambulatory Visit: Payer: Self-pay

## 2023-03-14 DIAGNOSIS — Z170 Estrogen receptor positive status [ER+]: Secondary | ICD-10-CM | POA: Insufficient documentation

## 2023-03-14 DIAGNOSIS — R3915 Urgency of urination: Secondary | ICD-10-CM | POA: Insufficient documentation

## 2023-03-14 DIAGNOSIS — M25511 Pain in right shoulder: Secondary | ICD-10-CM | POA: Insufficient documentation

## 2023-03-14 DIAGNOSIS — N3946 Mixed incontinence: Secondary | ICD-10-CM | POA: Insufficient documentation

## 2023-03-14 DIAGNOSIS — Z483 Aftercare following surgery for neoplasm: Secondary | ICD-10-CM | POA: Insufficient documentation

## 2023-03-14 DIAGNOSIS — C50311 Malignant neoplasm of lower-inner quadrant of right female breast: Secondary | ICD-10-CM | POA: Insufficient documentation

## 2023-03-14 DIAGNOSIS — R279 Unspecified lack of coordination: Secondary | ICD-10-CM | POA: Insufficient documentation

## 2023-03-14 DIAGNOSIS — R32 Unspecified urinary incontinence: Secondary | ICD-10-CM | POA: Diagnosis not present

## 2023-03-14 DIAGNOSIS — M6281 Muscle weakness (generalized): Secondary | ICD-10-CM | POA: Insufficient documentation

## 2023-03-14 DIAGNOSIS — M25552 Pain in left hip: Secondary | ICD-10-CM | POA: Insufficient documentation

## 2023-03-14 NOTE — Patient Instructions (Addendum)
Double-voiding:  This technique is to help with post-void dribbling, or leaking a little bit when you stand up right after urinating. Use relaxed toileting mechanics to urinate as much as you feel like you have to without straining. Sit back upright from leaning forward and relax this way for 10-20 seconds. Lean forward again to finish voiding any amount more.   Urge Incontinence  Ideal urination frequency is every 2-4 wakeful hours, which equates to 5-8 times within a 24-hour period.   Urge incontinence is leakage that occurs when the bladder muscle contracts, creating a sudden need to go before getting to the bathroom.   Going too often when your bladder isn't actually full can disrupt the body's automatic signals to store and hold urine longer, which will increase urgency/frequency.  In this case, the bladder "is running the show" and strategies can be learned to retrain this pattern.   One should be able to control the first urge to urinate, at around .  The bladder can hold up to a "grande latte," or . To help you gain control, practice the Urge Drill below when urgency strikes.  This drill will help retrain your bladder signals and allow you to store and hold urine longer.  The overall goal is to stretch out your time between voids to reach a more manageable voiding schedule.    Practice your "quick flicks" often throughout the day (each waking hour) even when you don't need feel the urge to go.  This will help strengthen your pelvic floor muscles, making them more effective in controlling leakage.  Urge Drill  When you feel an urge to go, follow these steps to regain control: Stop what you are doing and be still Take one deep breath, directing your air into your abdomen Think an affirming thought, such as "I've got this." Do 5 quick flicks of your pelvic floor Walk with control to the bathroom to void, or delay voiding    [facility] [address] [place], [place] [address] Phone # [phone] Fax [phone]

## 2023-03-14 NOTE — Therapy (Signed)
OUTPATIENT PHYSICAL THERAPY FEMALE PELVIC EVALUATION   Patient Name: [name] [name] MRN: [id number] DOB:[birth year], 72 y.o., female Today's Date: 03/14/2023  END OF SESSION:  PT End of Session - 03/14/23 1341     Visit Number 1   pelvic floor   Date for PT Re-Evaluation 05/09/23    Authorization Type HTA    PT Start Time 1230    PT Stop Time 1315    PT Time Calculation (min) 45 min    Activity Tolerance Patient tolerated treatment well    Behavior During Therapy WFL for tasks assessed/performed             Past Medical History:  Diagnosis Date   ADHD (attention deficit hyperactivity disorder)    Anemia    Anxiety    Arthritis    Depression    GERD (gastroesophageal reflux disease)    History of panic attacks    Hyperlipidemia    Hypertension    Hypothyroidism, postsurgical    Pre-diabetes    SBO (small bowel obstruction) (HCC) 01/15/2020   Sleep apnea    SUI (stress urinary incontinence, female)    Wears glasses    Past Surgical History:  Procedure Laterality Date   BREAST BIOPSY Right 10/07/2022   BREAST BIOPSY Right 10/07/2022   [place] RT BREAST BX W LOC DEV 1ST LESION IMG BX SPEC US GUIDE 10/07/2022 [facility]   BREAST BIOPSY  11/18/2022   MM RT RADIOACTIVE SEED LOC MAMMO GUIDE 11/18/2022 [facility]   BREAST LUMPECTOMY WITH RADIOACTIVE SEED AND SENTINEL LYMPH NODE BIOPSY Right 11/19/2022   Procedure: RIGHT BREAST LUMPECTOMY WITH RADIOACTIVE SEED;  Surgeon: [name], MD;  Location: [facility] OR;  Service: General;  Laterality: Right;   CATARACT EXTRACTION Bilateral    2023   D & C HYSTEROSCOPY W/ RESECTION ENDOMETRIAL POLYP  04/15/2009   D & C HYSTEROSCOPY W/ RESECTOSCOPE AND THERMACHOICE ENDOMETRIAL ABLATION  12/09/2010   EXCISION MELANOMA WITH SENTINEL LYMPH NODE BIOPSY Right 11/19/2022   Procedure: SENTINEL LYMPH NODE BIOPSY;  Surgeon: [name], MD;  Location: [facility] OR;  Service: General;  Laterality: Right;   LAPAROSCOPIC ASSISTED VENTRAL  HERNIA REPAIR Left 01/16/2020   Procedure: LAPAROSCOPIC ASSISTED HERNIA REPAIR;  Surgeon: Berna Bue, MD;  Location: [facility] OR;  Service: General;  Laterality: Left;   PUBOVAGINAL SLING N/A 07/26/2016   Procedure: PUBO-VAGINAL SLING ALTIS SLING;  Surgeon: Jethro Bolus, MD;  Location[facility];  Service: Urology;  Laterality: N/A;   REPAIR SPIGELIAN HERNIA     RESECTION GIANT CELL TUMOR RIGHT LONG FINGER  09/13/2008   REVERSE SHOULDER ARTHROPLASTY Right 07/16/2021   Procedure: REVERSE SHOULDER ARTHROPLASTY;  Surgeon: Jones Broom, MD;  Location: [facility] ORS;  Service: Orthopedics;  Laterality: Right;   ROTATOR CUFF REPAIR Bilateral    THYROIDECTOMY  1979    [facility]   non-malignant diseased thyroid   THYROIDECTOMY Left 03/17/2021   Procedure: COMPLETION THYROIDECTOMY, LEFT LOBE;  Surgeon: Darnell Level, MD;  Location: [facility];  Service: General;  Laterality: Left;   TOTAL KNEE ARTHROPLASTY Right 12/17/2019   Procedure: RIGHT TOTAL KNEE ARTHROPLASTY;  Surgeon: Gean Birchwood, MD;  Location: [facility];  Service: Orthopedics;  Laterality: Right;   TOTAL KNEE ARTHROPLASTY Left 03/03/2020   Procedure: LEFT TOTAL KNEE ARTHROPLASTY;  Surgeon: Gean Birchwood, MD;  Location: [facility];  Service: Orthopedics;  Laterality: Left;   TRIPLE ARTHRODESIS LEFT FOOT  04/20/2007   VENTRAL HERNIA REPAIR  2003   WRIST GANGLION EXCISION Right 1975  Patient Active Problem List   Diagnosis Date Noted   Genetic testing 11/01/2022   Family history of breast cancer 10/21/2022   Malignant neoplasm of lower-inner quadrant of right breast of female, estrogen receptor positive (HCC) 10/18/2022   Neoplasm of uncertain behavior of thyroid gland 03/15/2021   Left thyroid nodule 03/15/2021   S/P TKR (total knee replacement) using cement, left 03/03/2020   Degenerative arthritis of left knee 02/29/2020   SBO (small bowel obstruction) (HCC) 01/15/2020   S/P TKR (total knee replacement), right 12/19/2019   Total  knee replacement status, right 12/17/2019   Osteoarthritis of right knee 12/14/2019   Obesity, morbid (HCC) 12/16/2016   Strain of iliopsoas muscle, initial encounter 07/15/2016   Elevated cholesterol    Urinary incontinence    Endometrial polyp    Primary osteoarthritis of knees, bilateral 05/15/2009   DERANGEMENT OF ANTERIOR HORN OF LATERAL MENISCUS 05/15/2009   KNEE PAIN 05/15/2009    PCP: Wilfrid Lund, PA  REFERRING PROVIDER: Serena Croissant, MD  REFERRING DIAG: C50.311,Z17.0 (ICD-10-CM) - Malignant neoplasm of lower-inner quadrant of right breast of female, estrogen receptor positive (HCC) M25.511 (ICD-10-CM) - Pain in joint of right shoulder R32 (ICD-10-CM) - Urinary incontinence, unspecified type   THERAPY DIAG:  Urinary urgency  Mixed incontinence  Muscle weakness (generalized)  Pain in left hip  Unspecified lack of coordination  Rationale for Evaluation and Treatment: Rehabilitation  ONSET DATE: chronic   SUBJECTIVE:                                                                                                                                                                                           SUBJECTIVE STATEMENT: Pt states that she has been wearing pads for years for small leaks. She had a vaginal sling performed 06/27/2016 and she had pelvic floor PT performed and states that it was very helpful.  Fluid intake: Yes: one diet coke and a lot of water     PAIN:  Are you having pain? Yes NPRS scale: 3/10 Pain location:  Lt hip pain  Pain type: discomfort, tight Pain description: intermittent   Aggravating factors: prolonged sitting Relieving factors: movement, lying down   PRECAUTIONS: None  RED FLAGS: None   WEIGHT BEARING RESTRICTIONS: No  FALLS:  Has patient fallen in last 6 months? No  LIVING ENVIRONMENT: Lives with: lives with their family Lives in: House/apartment  OCCUPATION: not discussed  PLOF: Independent  PATIENT GOALS:  improve bladder control   PERTINENT HISTORY:  Breast cancer, small bowel obstruction, anxiety, ADHD, hernia repair Lt x 2, spigelian hernia, triple arthrodesis Lt foot Sexual abuse: No  BOWEL  MOVEMENT: Pain with bowel movement: No Type of bowel movement:Frequency generally every day or every other day and Strain No Fully empty rectum: Yes: - Leakage: Yes: in the last 2 months feels like it is related to water - has trouble reaching Pads: Yes: see below Fiber supplement: No Uses water to cleanse after bowel movement  URINATION: Pain with urination: No Fully empty bladder: Yes: - Stream: Strong Urgency: Yes: will fill pad on the way to the bathroom in the morning Frequency: will get distracted when she is needle pointing or working on other things and suppresses urge too long; only gets up 1x/night, every 3 hours during the day, before she goes anywhere Leakage: Urge to void, Coughing, Sneezing, and any jerking motion like in the car Pads: Yes: pad 100% of the time and brief if she is going out  INTERCOURSE: Pain with intercourse: no pain with intimate activities   PREGNANCY: Vaginal deliveries 2 Tearing Yes: unsure  PROLAPSE: none  OBJECTIVE:  03/14/23:  COGNITION: Overall cognitive status: Within functional limits for tasks assessed     SENSATION: Light touch: Appears intact Proprioception: Appears intact  FUNCTIONAL TESTS:  Breath holding with transitions BUE assist for sit<>stand  GAIT: Comments: mild compensated trendelenburg   POSTURE: rounded shoulders, forward head, increased lumbar lordosis, increased thoracic kyphosis, anterior pelvic tilt, and flexed trunk    PALPATION:   General  No tenderness                External Perineal Exam increased redness                             Internal Pelvic Floor no tenderness, WNL  Patient confirms identification and approves PT to assess internal pelvic floor and treatment Yes  PELVIC MMT:   MMT eval   Vaginal 1/5, 4 second endurance, 5 repeat contractions   (Blank rows = not tested)        TONE: low  PROLAPSE: Mild anterior vaginal wall laxity   TODAY'S TREATMENT:                                                                                                                              DATE:  03/14/23  EVAL  Neuromuscular re-education: Pelvic floor muscle contraction training Quick flicks Long holds Urge drill Double-voiding   PATIENT EDUCATION:  Education details: See above Person educated: Patient Education method: Programmer, multimedia, Demonstration, Tactile cues, Verbal cues, and Handouts Education comprehension: verbalized understanding  HOME EXERCISE PROGRAM: [id number]  ASSESSMENT:  CLINICAL IMPRESSION: Patient is a 72 y.o. female who was seen today for physical therapy evaluation and treatment for mixed urinary incontinence, urinary urgency, fecal incontinence (just started in last 2 months), and Lt hip pain. Exam findings notable for decreased pelvic floor muscle strength, decreased pelvic floor muscle endurance, difficulty with coordination of pelvic floor contraction and repetitions, and mild anterior vaginal wall laxity. Signs and  symptoms are most consistent with pelvic floor muscle weakness and poor pressure management. Initial treatment consisted of urge drill, double voiding, and pelvic floor contraction training (strengthening program given). She will continue to benefit from skilled PT intervention in order to improve bladder/bowel control and begin functional strengthening program in order to improve quality of life.   OBJECTIVE IMPAIRMENTS: decreased activity tolerance, decreased coordination, decreased endurance, decreased strength, increased fascial restrictions, increased muscle spasms, impaired tone, postural dysfunction, and pain.   ACTIVITY LIMITATIONS: continence  PARTICIPATION LIMITATIONS: community activity  PERSONAL FACTORS: 1 comorbidity: medical  history  are also affecting patient's functional outcome.   REHAB POTENTIAL: Good  CLINICAL DECISION MAKING: Stable/uncomplicated  EVALUATION COMPLEXITY: Low   GOALS: Goals reviewed with patient? Yes  SHORT TERM GOALS: Target date: 04/11/23  Pt will be independent with HEP.   Baseline: Goal status: INITIAL  2.  Pt will be independent with the knack, urge suppression technique, and double voiding in order to improve bladder habits and decrease urinary incontinence.   Baseline:  Goal status: INITIAL  3.  Pt will be independent with use of squatty potty, relaxed toileting mechanics, and improved bowel movement techniques in order to increase ease of bowel movements and complete evacuation.   Baseline:  Goal status: INITIAL  4.  Pt will be able to correctly perform diaphragmatic breathing and appropriate pressure management in order to prevent worsening vaginal wall laxity and improve pelvic floor A/ROM.   Baseline:  Goal status: INITIAL   LONG TERM GOALS: Target date: 05/09/23  Pt will be independent with advanced HEP.   Baseline:  Goal status: INITIAL  2.  Pt will demonstrate normal pelvic floor muscle tone and A/ROM, able to achieve 4/5 strength with contractions and 10 sec endurance, in order to provide appropriate lumbopelvic support in functional activities.   Baseline:  Goal status: INITIAL  3.  Pt will be able to go 2-3 hours in between voids without urgency or incontinence in order to improve QOL and perform all functional activities with less difficulty.   Baseline:  Goal status: INITIAL  4.  Pt will report no episodes of urinary or fecal incontinence in order to improve confidence in community activities and personal hygiene.   Baseline:  Goal status: INITIAL  5.  Pt will report no greater than 1/10 Lt hip pain.  Baseline:  Goal status: INITIAL   PLAN:  PT FREQUENCY: 1-2x/week  PT DURATION: 6 months  PLANNED INTERVENTIONS: Therapeutic  exercises, Therapeutic activity, Neuromuscular re-education, Balance training, Gait training, Patient/Family education, Self Care, Joint mobilization, Dry Needling, Biofeedback, and Manual therapy  PLAN FOR NEXT SESSION: Review pelvic floor strengthening program and begin core training.    Julio Alm, PT, DPT07/29/241:43 PM

## 2023-03-14 NOTE — Therapy (Signed)
OUTPATIENT PHYSICAL THERAPY SOZO SCREENING NOTE   Patient Name: [name] [name] MRN: [id number] DOB:1951/01/12, 72 y.o., female Today's Date: 03/14/2023  PCP: Wilfrid Lund, [place] REFERRING PROVIDER: [name], MD   PT End of Session - 03/14/23 1235     Visit Number 7    PT Start Time 1215    PT Stop Time 1225    PT Time Calculation (min) 10 min    Activity Tolerance Patient tolerated treatment well    Behavior During Therapy WFL for tasks assessed/performed             Past Medical History:  Diagnosis Date   ADHD (attention deficit hyperactivity disorder)    Anemia    Anxiety    Arthritis    Depression    GERD (gastroesophageal reflux disease)    History of panic attacks    Hyperlipidemia    Hypertension    Hypothyroidism, postsurgical    Pre-diabetes    SBO (small bowel obstruction) (HCC) 01/15/2020   Sleep apnea    SUI (stress urinary incontinence, female)    Wears glasses    Past Surgical History:  Procedure Laterality Date   BREAST BIOPSY Right 10/07/2022   BREAST BIOPSY Right 10/07/2022   [place] RT BREAST BX W LOC DEV 1ST LESION IMG BX SPEC US GUIDE 10/07/2022 [facility]   BREAST BIOPSY  11/18/2022   MM RT RADIOACTIVE SEED LOC MAMMO GUIDE 11/18/2022 [facility]   BREAST LUMPECTOMY WITH RADIOACTIVE SEED AND SENTINEL LYMPH NODE BIOPSY Right 11/19/2022   Procedure: RIGHT BREAST LUMPECTOMY WITH RADIOACTIVE SEED;  Surgeon: [name], MD;  Location: [facility] OR;  Service: General;  Laterality: Right;   CATARACT EXTRACTION Bilateral    2023   D & C HYSTEROSCOPY W/ RESECTION ENDOMETRIAL POLYP  04/15/2009   D & C HYSTEROSCOPY W/ RESECTOSCOPE AND THERMACHOICE ENDOMETRIAL ABLATION  12/09/2010   EXCISION MELANOMA WITH SENTINEL LYMPH NODE BIOPSY Right 11/19/2022   Procedure: SENTINEL LYMPH NODE BIOPSY;  Surgeon: [name], MD;  Location: [facility] OR;  Service: General;  Laterality: Right;   LAPAROSCOPIC ASSISTED VENTRAL HERNIA REPAIR Left 01/16/2020    Procedure: LAPAROSCOPIC ASSISTED HERNIA REPAIR;  Surgeon: Berna Bue, MD;  Location: [facility] OR;  Service: General;  Laterality: Left;   PUBOVAGINAL SLING N/A 07/26/2016   Procedure: PUBO-VAGINAL SLING ALTIS SLING;  Surgeon: Jethro Bolus, MD;  Location: [facility];  Service: Urology;  Laterality: N/A;   REPAIR SPIGELIAN HERNIA     RESECTION GIANT CELL TUMOR RIGHT LONG FINGER  09/13/2008   REVERSE SHOULDER ARTHROPLASTY Right 07/16/2021   Procedure: REVERSE SHOULDER ARTHROPLASTY;  Surgeon: Jones Broom, MD;  Location: [facility];  Service: Orthopedics;  Laterality: Right;   ROTATOR CUFF REPAIR Bilateral    THYROIDECTOMY  1979    [facility]   non-malignant diseased thyroid   THYROIDECTOMY Left 03/17/2021   Procedure: COMPLETION THYROIDECTOMY, LEFT LOBE;  Surgeon: Darnell Level, MD;  Location: [facility];  Service: General;  Laterality: Left;   TOTAL KNEE ARTHROPLASTY Right 12/17/2019   Procedure: RIGHT TOTAL KNEE ARTHROPLASTY;  Surgeon: Gean Birchwood, MD;  Location: [facility];  Service: Orthopedics;  Laterality: Right;   TOTAL KNEE ARTHROPLASTY Left 03/03/2020   Procedure: LEFT TOTAL KNEE ARTHROPLASTY;  Surgeon: Gean Birchwood, MD;  Location: [facility];  Service: Orthopedics;  Laterality: Left;   TRIPLE ARTHRODESIS LEFT FOOT  04/20/2007   VENTRAL HERNIA REPAIR  2003   WRIST GANGLION EXCISION Right 1975   Patient Active Problem List   Diagnosis  Date Noted   Genetic testing 11/01/2022   Family history of breast cancer 10/21/2022   Malignant neoplasm of lower-inner quadrant of right breast of female, estrogen receptor positive (HCC) 10/18/2022   Neoplasm of uncertain behavior of thyroid gland 03/15/2021   Left thyroid nodule 03/15/2021   S/P TKR (total knee replacement) using cement, left 03/03/2020   Degenerative arthritis of left knee 02/29/2020   SBO (small bowel obstruction) (HCC) 01/15/2020   S/P TKR (total knee replacement), right 12/19/2019   Total knee replacement status, right  12/17/2019   Osteoarthritis of right knee 12/14/2019   Obesity, morbid (HCC) 12/16/2016   Strain of iliopsoas muscle, initial encounter 07/15/2016   Elevated cholesterol    Urinary incontinence    Endometrial polyp    Primary osteoarthritis of knees, bilateral 05/15/2009   DERANGEMENT OF ANTERIOR HORN OF LATERAL MENISCUS 05/15/2009   KNEE PAIN 05/15/2009    REFERRING DIAG: right breast cancer at risk for lymphedema  THERAPY DIAG:  Aftercare following surgery for neoplasm  PERTINENT HISTORY: Patient was diagnosed on 09/21/2022 with right grade 2 invasive ductal carcinoma breast cancer. She underwent a right lumpectomy and sentinel node biopsy on 11/19/2022 but there as not nodal tissue in the sentinel node biopsy. Final pathology was ER/PR positive and HER2 negative with a Ki67 of 2%. Hx left rotator cuff repair 08/05/20. Pins placed in left foot 2003. Hx of right shoulder replacement 07/16/2021.   PRECAUTIONS: right UE Lymphedema risk  SUBJECTIVE: Here for SOZO screen  PAIN:  Are you having pain? No  SOZO SCREENING: Patient was assessed today using the SOZO machine to determine the lymphedema index score. This was compared to her baseline score. It was determined that she is within the recommended range when compared to her baseline and no further action is needed at this time. She will continue SOZO screenings. These are done every 3 months for 2 years post operatively followed by every 6 months for 2 years, and then annually.   L-DEX FLOWSHEETS - 03/14/23 1200       L-DEX LYMPHEDEMA SCREENING   Measurement Type Unilateral    L-DEX MEASUREMENT EXTREMITY Upper Extremity    POSITION  Standing    DOMINANT SIDE Right    At Risk Side Right    BASELINE SCORE (UNILATERAL) 3.7    L-DEX SCORE (UNILATERAL) -1.1    VALUE CHANGE (UNILAT) -4.8             Bethann Punches, [place] 03/14/23 12:37 PM

## 2023-03-21 DIAGNOSIS — F4310 Post-traumatic stress disorder, unspecified: Secondary | ICD-10-CM | POA: Diagnosis not present

## 2023-03-23 ENCOUNTER — Ambulatory Visit: Payer: PPO | Attending: Hematology and Oncology

## 2023-03-23 DIAGNOSIS — R279 Unspecified lack of coordination: Secondary | ICD-10-CM | POA: Insufficient documentation

## 2023-03-23 DIAGNOSIS — N3946 Mixed incontinence: Secondary | ICD-10-CM | POA: Insufficient documentation

## 2023-03-23 DIAGNOSIS — M6281 Muscle weakness (generalized): Secondary | ICD-10-CM | POA: Diagnosis not present

## 2023-03-23 DIAGNOSIS — M25552 Pain in left hip: Secondary | ICD-10-CM | POA: Insufficient documentation

## 2023-03-23 DIAGNOSIS — R3915 Urgency of urination: Secondary | ICD-10-CM | POA: Diagnosis not present

## 2023-03-23 NOTE — Therapy (Signed)
OUTPATIENT PHYSICAL THERAPY FEMALE PELVIC EVALUATION   Patient Name: [name] [name] MRN: [id number] DOB:04/13/1951, 72 y.o., female Today's Date: 03/23/2023  END OF SESSION:  PT End of Session - 03/23/23 1446     Visit Number 2    Date for PT Re-Evaluation 05/09/23    Authorization Type HTA    Progress Note Due on Visit 10    PT Start Time 1445    PT Stop Time 1530    PT Time Calculation (min) 45 min    Activity Tolerance Patient tolerated treatment well    Behavior During Therapy WFL for tasks assessed/performed             Past Medical History:  Diagnosis Date   ADHD (attention deficit hyperactivity disorder)    Anemia    Anxiety    Arthritis    Depression    GERD (gastroesophageal reflux disease)    History of panic attacks    Hyperlipidemia    Hypertension    Hypothyroidism, postsurgical    Pre-diabetes    SBO (small bowel obstruction) (HCC) 01/15/2020   Sleep apnea    SUI (stress urinary incontinence, female)    Wears glasses    Past Surgical History:  Procedure Laterality Date   BREAST BIOPSY Right 10/07/2022   BREAST BIOPSY Right 10/07/2022   [place] RT BREAST BX W LOC DEV 1ST LESION IMG BX SPEC US GUIDE 10/07/2022 [facility]   BREAST BIOPSY  11/18/2022   MM RT RADIOACTIVE SEED LOC MAMMO GUIDE 11/18/2022 [facility]   BREAST LUMPECTOMY WITH RADIOACTIVE SEED AND SENTINEL LYMPH NODE BIOPSY Right 11/19/2022   Procedure: RIGHT BREAST LUMPECTOMY WITH RADIOACTIVE SEED;  Surgeon: [name], MD;  [facility] OR;  Service: General;  Laterality: Right;   CATARACT EXTRACTION Bilateral    2023   D & C HYSTEROSCOPY W/ RESECTION ENDOMETRIAL POLYP  04/15/2009   D & C HYSTEROSCOPY W/ RESECTOSCOPE AND THERMACHOICE ENDOMETRIAL ABLATION  12/09/2010   EXCISION MELANOMA WITH SENTINEL LYMPH NODE BIOPSY Right 11/19/2022   Procedure: SENTINEL LYMPH NODE BIOPSY;  Surgeon: [name], MD;  Location: [facility] OR;  Service: General;  Laterality: Right;   LAPAROSCOPIC  ASSISTED VENTRAL HERNIA REPAIR Left 01/16/2020   Procedure: LAPAROSCOPIC ASSISTED HERNIA REPAIR;  Surgeon: Berna Bue, MD;  Location: [facility] OR;  Service: General;  Laterality: Left;   PUBOVAGINAL SLING N/A 07/26/2016   Procedure: PUBO-VAGINAL SLING ALTIS SLING;  Surgeon: Jethro Bolus, MD;  Location: [facility] [place];  Service: Urology;  Laterality: N/A;   REPAIR SPIGELIAN HERNIA     RESECTION GIANT CELL TUMOR RIGHT LONG FINGER  09/13/2008   REVERSE SHOULDER ARTHROPLASTY Right 07/16/2021   Procedure: REVERSE SHOULDER ARTHROPLASTY;  Surgeon: Jones Broom, MD;  Location: [facility];  Service: Orthopedics;  Laterality: Right;   ROTATOR CUFF REPAIR Bilateral    THYROIDECTOMY  1979    [facility]   non-malignant diseased thyroid   THYROIDECTOMY Left 03/17/2021   Procedure: COMPLETION THYROIDECTOMY, LEFT LOBE;  Surgeon: Darnell Level, MD;  Location: [facility];  Service: General;  Laterality: Left;   TOTAL KNEE ARTHROPLASTY Right 12/17/2019   Procedure: RIGHT TOTAL KNEE ARTHROPLASTY;  Surgeon: Gean Birchwood, MD;  Location: [facility];  Service: Orthopedics;  Laterality: Right;   TOTAL KNEE ARTHROPLASTY Left 03/03/2020   Procedure: LEFT TOTAL KNEE ARTHROPLASTY;  Surgeon: Gean Birchwood, MD;  Location: [facility];  Service: Orthopedics;  Laterality: Left;   TRIPLE ARTHRODESIS LEFT FOOT  04/20/2007   VENTRAL HERNIA REPAIR  2003  WRIST GANGLION EXCISION Right 1975   Patient Active Problem List   Diagnosis Date Noted   Genetic testing 11/01/2022   Family history of breast cancer 10/21/2022   Malignant neoplasm of lower-inner quadrant of right breast of female, estrogen receptor positive (HCC) 10/18/2022   Neoplasm of uncertain behavior of thyroid gland 03/15/2021   Left thyroid nodule 03/15/2021   S/P TKR (total knee replacement) using cement, left 03/03/2020   Degenerative arthritis of left knee 02/29/2020   SBO (small bowel obstruction) (HCC) 01/15/2020   S/P TKR (total knee replacement), right  12/19/2019   Total knee replacement status, right 12/17/2019   Osteoarthritis of right knee 12/14/2019   Obesity, morbid (HCC) 12/16/2016   Strain of iliopsoas muscle, initial encounter 07/15/2016   Elevated cholesterol    Urinary incontinence    Endometrial polyp    Primary osteoarthritis of knees, bilateral 05/15/2009   DERANGEMENT OF ANTERIOR HORN OF LATERAL MENISCUS 05/15/2009   KNEE PAIN 05/15/2009    PCP: Wilfrid Lund, PA  REFERRING PROVIDER: Serena Croissant, MD  REFERRING DIAG: C50.311,Z17.0 (ICD-10-CM) - Malignant neoplasm of lower-inner quadrant of right breast of female, estrogen receptor positive (HCC) M25.511 (ICD-10-CM) - Pain in joint of right shoulder R32 (ICD-10-CM) - Urinary incontinence, unspecified type   THERAPY DIAG:  Urinary urgency  Muscle weakness (generalized)  Pain in left hip  Unspecified lack of coordination  Mixed incontinence  Rationale for Evaluation and Treatment: Rehabilitation  ONSET DATE: chronic   SUBJECTIVE:                                                                                                                                                                                           SUBJECTIVE STATEMENT: Pt feels like urge drill is very helpful and she is using regularly. She has been practicing double voiding. She has also been working on pelvic floor contractions. She has noticed that her pads have been dry. She did have one episode   PAIN:  Are you having pain? Yes NPRS scale: 3/10 Pain location:  Lt hip pain  Pain type: discomfort, tight Pain description: intermittent   Aggravating factors: prolonged sitting Relieving factors: movement, lying down   PRECAUTIONS: None  RED FLAGS: None   WEIGHT BEARING RESTRICTIONS: No  FALLS:  Has patient fallen in last 6 months? No  LIVING ENVIRONMENT: Lives with: lives with their family Lives in: House/apartment  OCCUPATION: not discussed  PLOF:  Independent  PATIENT GOALS: improve bladder control   PERTINENT HISTORY:  Breast cancer, small bowel obstruction, anxiety, ADHD, hernia repair Lt x 2, spigelian hernia, triple arthrodesis Lt foot Sexual abuse: No  BOWEL MOVEMENT: Pain with bowel movement: No Type of bowel movement:Frequency generally every day or every other day and Strain No Fully empty rectum: Yes: - Leakage: Yes: in the last 2 months feels like it is related to water - has trouble reaching Pads: Yes: see below Fiber supplement: No Uses water to cleanse after bowel movement  URINATION: Pain with urination: No Fully empty bladder: Yes: - Stream: Strong Urgency: Yes: will fill pad on the way to the bathroom in the morning Frequency: will get distracted when she is needle pointing or working on other things and suppresses urge too long; only gets up 1x/night, every 3 hours during the day, before she goes anywhere Leakage: Urge to void, Coughing, Sneezing, and any jerking motion like in the car Pads: Yes: pad 100% of the time and brief if she is going out  INTERCOURSE: Pain with intercourse: no pain with intimate activities   PREGNANCY: Vaginal deliveries 2 Tearing Yes: unsure  PROLAPSE: none  OBJECTIVE:  03/14/23:  COGNITION: Overall cognitive status: Within functional limits for tasks assessed     SENSATION: Light touch: Appears intact Proprioception: Appears intact  FUNCTIONAL TESTS:  Breath holding with transitions BUE assist for sit<>stand  GAIT: Comments: mild compensated trendelenburg   POSTURE: rounded shoulders, forward head, increased lumbar lordosis, increased thoracic kyphosis, anterior pelvic tilt, and flexed trunk    PALPATION:   General  No tenderness                External Perineal Exam increased redness                             Internal Pelvic Floor no tenderness, WNL  Patient confirms identification and approves PT to assess internal pelvic floor and treatment  Yes  PELVIC MMT:   MMT eval  Vaginal 1/5, 4 second endurance, 5 repeat contractions   (Blank rows = not tested)        TONE: low  PROLAPSE: Mild anterior vaginal wall laxity   TODAY'S TREATMENT:                                                                                                                              DATE:  03/23/23 Neuromuscular re-education: Diaphragmatic breathing training Pelvic floor contraction training review Transversus abdominus training with multimodal cues for improved motor control and breath coordination Seated core facilitation exercises 2 x 10 Mindfulness (information given)   03/14/23  EVAL  Neuromuscular re-education: Pelvic floor muscle contraction training Quick flicks Long holds Urge drill Double-voiding   PATIENT EDUCATION:  Education details: See above Person educated: Patient Education method: Explanation, Demonstration, Tactile cues, Verbal cues, and Handouts Education comprehension: verbalized understanding  HOME EXERCISE PROGRAM: [id number]  ASSESSMENT:  CLINICAL IMPRESSION: Pt has made  great initial progress after first session with good decrease in urinary incontinence and urgency. We worked on diaphragmatic breathing and core training today in  seated. She was able to achieve good coordinated core contraction with breathing. Believe that mindfulness will be very important for patient to help manage abdominal pressure and allow for good pelvic floor movement. She will continue to benefit from skilled PT intervention in order to improve bladder/bowel control and begin functional strengthening program in order to improve quality of life.   OBJECTIVE IMPAIRMENTS: decreased activity tolerance, decreased coordination, decreased endurance, decreased strength, increased fascial restrictions, increased muscle spasms, impaired tone, postural dysfunction, and pain.   ACTIVITY LIMITATIONS: continence  PARTICIPATION LIMITATIONS:  community activity  PERSONAL FACTORS: 1 comorbidity: medical history  are also affecting patient's functional outcome.   REHAB POTENTIAL: Good  CLINICAL DECISION MAKING: Stable/uncomplicated  EVALUATION COMPLEXITY: Low   GOALS: Goals reviewed with patient? Yes  SHORT TERM GOALS: Target date: 04/11/23  Pt will be independent with HEP.   Baseline: Goal status: INITIAL  2.  Pt will be independent with the knack, urge suppression technique, and double voiding in order to improve bladder habits and decrease urinary incontinence.   Baseline:  Goal status: INITIAL  3.  Pt will be independent with use of squatty potty, relaxed toileting mechanics, and improved bowel movement techniques in order to increase ease of bowel movements and complete evacuation.   Baseline:  Goal status: INITIAL  4.  Pt will be able to correctly perform diaphragmatic breathing and appropriate pressure management in order to prevent worsening vaginal wall laxity and improve pelvic floor A/ROM.   Baseline:  Goal status: INITIAL   LONG TERM GOALS: Target date: 05/09/23  Pt will be independent with advanced HEP.   Baseline:  Goal status: INITIAL  2.  Pt will demonstrate normal pelvic floor muscle tone and A/ROM, able to achieve 4/5 strength with contractions and 10 sec endurance, in order to provide appropriate lumbopelvic support in functional activities.   Baseline:  Goal status: INITIAL  3.  Pt will be able to go 2-3 hours in between voids without urgency or incontinence in order to improve QOL and perform all functional activities with less difficulty.   Baseline:  Goal status: INITIAL  4.  Pt will report no episodes of urinary or fecal incontinence in order to improve confidence in community activities and personal hygiene.   Baseline:  Goal status: INITIAL  5.  Pt will report no greater than 1/10 Lt hip pain.  Baseline:  Goal status: INITIAL   PLAN:  PT FREQUENCY: 1-2x/week  PT  DURATION: 6 months  PLANNED INTERVENTIONS: Therapeutic exercises, Therapeutic activity, Neuromuscular re-education, Balance training, Gait training, Patient/Family education, Self Care, Joint mobilization, Dry Needling, Biofeedback, and Manual therapy  PLAN FOR NEXT SESSION: Progress core training; bladder retraining?   Julio Alm, PT, DPT08/07/242:47 PM

## 2023-03-31 DIAGNOSIS — R400 Somnolence: Secondary | ICD-10-CM | POA: Diagnosis not present

## 2023-03-31 DIAGNOSIS — G4733 Obstructive sleep apnea (adult) (pediatric): Secondary | ICD-10-CM | POA: Diagnosis not present

## 2023-04-04 ENCOUNTER — Ambulatory Visit
Admission: RE | Admit: 2023-04-04 | Discharge: 2023-04-04 | Disposition: A | Discharge status: Home / Self Care | Payer: PPO | Source: Ambulatory Visit | Attending: Hematology and Oncology | Admitting: Hematology and Oncology

## 2023-04-04 NOTE — Progress Notes (Signed)
  Radiation Oncology         [phone] ________________________________  Name: [name] [name] MRN: [id number]  Date of Service: 04/04/2023  DOB: Dec 17, 1950  Post Treatment Telephone Note  Diagnosis:  Stage IA, [id number], grade 1, ER/PR positive invasive ductal carcinoma of the right breast (as documented in provider EOT note)  The patient was not available for call today. Voicemail left.  The patient was encouraged to avoid sun exposure in the area of prior treatment for up to one year following radiation with either sunscreen or by the style of clothing worn in the sun.  The patient has a scheduled follow up with [facility] Anes in Survivorship for ongoing surveillance, and was encouraged to call if she develops concerns or questions regarding radiation.    Ruel Favors, LPN

## 2023-04-13 DIAGNOSIS — R400 Somnolence: Secondary | ICD-10-CM | POA: Diagnosis not present

## 2023-04-13 DIAGNOSIS — G4733 Obstructive sleep apnea (adult) (pediatric): Secondary | ICD-10-CM | POA: Diagnosis not present

## 2023-04-19 DIAGNOSIS — F4310 Post-traumatic stress disorder, unspecified: Secondary | ICD-10-CM | POA: Diagnosis not present

## 2023-04-26 ENCOUNTER — Ambulatory Visit: Payer: PPO | Attending: Hematology and Oncology

## 2023-04-26 DIAGNOSIS — M25552 Pain in left hip: Secondary | ICD-10-CM | POA: Diagnosis not present

## 2023-04-26 DIAGNOSIS — M6281 Muscle weakness (generalized): Secondary | ICD-10-CM | POA: Diagnosis not present

## 2023-04-26 DIAGNOSIS — R279 Unspecified lack of coordination: Secondary | ICD-10-CM | POA: Insufficient documentation

## 2023-04-26 DIAGNOSIS — N3946 Mixed incontinence: Secondary | ICD-10-CM | POA: Diagnosis not present

## 2023-04-26 DIAGNOSIS — R3915 Urgency of urination: Secondary | ICD-10-CM | POA: Insufficient documentation

## 2023-04-26 NOTE — Therapy (Signed)
OUTPATIENT PHYSICAL THERAPY FEMALE PELVIC EVALUATION   Patient Name: [name] [name] MRN: [id number] DOB:1951/03/08, 72 y.o., female Today's Date: 04/26/2023  END OF SESSION:  PT End of Session - 04/26/23 1451     Visit Number 3    Date for PT Re-Evaluation 05/09/23    Authorization Type HTA    PT Start Time 1451    PT Stop Time 1529    PT Time Calculation (min) 38 min    Activity Tolerance Patient tolerated treatment well    Behavior During Therapy WFL for tasks assessed/performed              Past Medical History:  Diagnosis Date   ADHD (attention deficit hyperactivity disorder)    Anemia    Anxiety    Arthritis    Depression    GERD (gastroesophageal reflux disease)    History of panic attacks    Hyperlipidemia    Hypertension    Hypothyroidism, postsurgical    Pre-diabetes    SBO (small bowel obstruction) (HCC) 01/15/2020   Sleep apnea    SUI (stress urinary incontinence, female)    Wears glasses    Past Surgical History:  Procedure Laterality Date   BREAST BIOPSY Right 10/07/2022   BREAST BIOPSY Right 10/07/2022   [place] RT BREAST BX W LOC DEV 1ST LESION IMG BX SPEC US GUIDE 10/07/2022 [facility]   BREAST BIOPSY  11/18/2022   MM RT RADIOACTIVE SEED LOC MAMMO GUIDE 11/18/2022 [facility]   BREAST LUMPECTOMY WITH RADIOACTIVE SEED AND SENTINEL LYMPH NODE BIOPSY Right 11/19/2022   Procedure: RIGHT BREAST LUMPECTOMY WITH RADIOACTIVE SEED;  Surgeon: [name], MD;  Location: [facility] OR;  Service: General;  Laterality: Right;   CATARACT EXTRACTION Bilateral    2023   D & C HYSTEROSCOPY W/ RESECTION ENDOMETRIAL POLYP  04/15/2009   D & C HYSTEROSCOPY W/ RESECTOSCOPE AND THERMACHOICE ENDOMETRIAL ABLATION  12/09/2010   EXCISION MELANOMA WITH SENTINEL LYMPH NODE BIOPSY Right 11/19/2022   Procedure: SENTINEL LYMPH NODE BIOPSY;  Surgeon: [name], MD;  Location: [facility] OR;  Service: General;  Laterality: Right;   LAPAROSCOPIC ASSISTED VENTRAL HERNIA REPAIR  Left 01/16/2020   Procedure: LAPAROSCOPIC ASSISTED HERNIA REPAIR;  Surgeon: Berna Bue, MD;  Location: [facility] OR;  Service: General;  Laterality: Left;   PUBOVAGINAL SLING N/A 07/26/2016   Procedure: PUBO-VAGINAL SLING ALTIS SLING;  Surgeon: Jethro Bolus, MD;  Location: [facility] [place];  Service: Urology;  Laterality: N/A;   REPAIR SPIGELIAN HERNIA     RESECTION GIANT CELL TUMOR RIGHT LONG FINGER  09/13/2008   REVERSE SHOULDER ARTHROPLASTY Right 07/16/2021   Procedure: REVERSE SHOULDER ARTHROPLASTY;  Surgeon: Jones Broom, MD;  Location: [facility];  Service: Orthopedics;  Laterality: Right;   ROTATOR CUFF REPAIR Bilateral    THYROIDECTOMY  1979    [facility]   non-malignant diseased thyroid   THYROIDECTOMY Left 03/17/2021   Procedure: COMPLETION THYROIDECTOMY, LEFT LOBE;  Surgeon: Darnell Level, MD;  Location: [facility];  Service: General;  Laterality: Left;   TOTAL KNEE ARTHROPLASTY Right 12/17/2019   Procedure: RIGHT TOTAL KNEE ARTHROPLASTY;  Surgeon: Gean Birchwood, MD;  Location: [facility];  Service: Orthopedics;  Laterality: Right;   TOTAL KNEE ARTHROPLASTY Left 03/03/2020   Procedure: LEFT TOTAL KNEE ARTHROPLASTY;  Surgeon: Gean Birchwood, MD;  Location: [facility];  Service: Orthopedics;  Laterality: Left;   TRIPLE ARTHRODESIS LEFT FOOT  04/20/2007   VENTRAL HERNIA REPAIR  2003   WRIST GANGLION EXCISION Right 1975  Patient Active Problem List   Diagnosis Date Noted   Genetic testing 11/01/2022   Family history of breast cancer 10/21/2022   Malignant neoplasm of lower-inner quadrant of right breast of female, estrogen receptor positive (HCC) 10/18/2022   Neoplasm of uncertain behavior of thyroid gland 03/15/2021   Left thyroid nodule 03/15/2021   S/P TKR (total knee replacement) using cement, left 03/03/2020   Degenerative arthritis of left knee 02/29/2020   SBO (small bowel obstruction) (HCC) 01/15/2020   S/P TKR (total knee replacement), right 12/19/2019   Total knee  replacement status, right 12/17/2019   Osteoarthritis of right knee 12/14/2019   Obesity, morbid (HCC) 12/16/2016   Strain of iliopsoas muscle, initial encounter 07/15/2016   Elevated cholesterol    Urinary incontinence    Endometrial polyp    Primary osteoarthritis of knees, bilateral 05/15/2009   DERANGEMENT OF ANTERIOR HORN OF LATERAL MENISCUS 05/15/2009   KNEE PAIN 05/15/2009    PCP: Wilfrid Lund, PA  REFERRING PROVIDER: Serena Croissant, MD  REFERRING DIAG: C50.311,Z17.0 (ICD-10-CM) - Malignant neoplasm of lower-inner quadrant of right breast of female, estrogen receptor positive (HCC) M25.511 (ICD-10-CM) - Pain in joint of right shoulder R32 (ICD-10-CM) - Urinary incontinence, unspecified type   THERAPY DIAG:  Urinary urgency  Muscle weakness (generalized)  Pain in left hip  Unspecified lack of coordination  Mixed incontinence  Rationale for Evaluation and Treatment: Rehabilitation  ONSET DATE: chronic   SUBJECTIVE:                                                                                                                                                                                           SUBJECTIVE STATEMENT: Pt states that she is feeling more bladder control. She is having good success with urge drill. She feels like she is feeling stronger contractions. If she stays in bed too long, she will still have issues getting to the bathroom in the morning, but better. She still has some issues with coughing.   PAIN:  Are you having pain? Yes NPRS scale: 0/10 Pain location:  Lt hip pain  Pain type: discomfort, tight Pain description: intermittent   Aggravating factors: prolonged sitting Relieving factors: movement, lying down   PRECAUTIONS: None  RED FLAGS: None   WEIGHT BEARING RESTRICTIONS: No  FALLS:  Has patient fallen in last 6 months? No  LIVING ENVIRONMENT: Lives with: lives with their family Lives in: House/apartment  OCCUPATION: not  discussed  PLOF: Independent  PATIENT GOALS: improve bladder control   PERTINENT HISTORY:  Breast cancer, small bowel obstruction, anxiety, ADHD, hernia repair Lt x 2, spigelian hernia, triple arthrodesis Lt foot  Sexual abuse: No  BOWEL MOVEMENT: Pain with bowel movement: No Type of bowel movement:Frequency generally every day or every other day and Strain No Fully empty rectum: Yes: - Leakage: Yes: in the last 2 months feels like it is related to water - has trouble reaching Pads: Yes: see below Fiber supplement: No Uses water to cleanse after bowel movement  URINATION: Pain with urination: No Fully empty bladder: Yes: - Stream: Strong Urgency: Yes: will fill pad on the way to the bathroom in the morning Frequency: will get distracted when she is needle pointing or working on other things and suppresses urge too long; only gets up 1x/night, every 3 hours during the day, before she goes anywhere Leakage: Urge to void, Coughing, Sneezing, and any jerking motion like in the car Pads: Yes: pad 100% of the time and brief if she is going out  INTERCOURSE: Pain with intercourse: no pain with intimate activities   PREGNANCY: Vaginal deliveries 2 Tearing Yes: unsure  PROLAPSE: none  OBJECTIVE:  03/14/23:  COGNITION: Overall cognitive status: Within functional limits for tasks assessed     SENSATION: Light touch: Appears intact Proprioception: Appears intact  FUNCTIONAL TESTS:  Breath holding with transitions BUE assist for sit<>stand  GAIT: Comments: mild compensated trendelenburg   POSTURE: rounded shoulders, forward head, increased lumbar lordosis, increased thoracic kyphosis, anterior pelvic tilt, and flexed trunk    PALPATION:   General  No tenderness                External Perineal Exam increased redness                             Internal Pelvic Floor no tenderness, WNL  Patient confirms identification and approves PT to assess internal pelvic floor and  treatment Yes  PELVIC MMT:   MMT eval  Vaginal 1/5, 4 second endurance, 5 repeat contractions   (Blank rows = not tested)        TONE: low  PROLAPSE: Mild anterior vaginal wall laxity   TODAY'S TREATMENT:                                                                                                                              DATE:  04/26/23 Neuromuscular re-education: Seated hip adduction with core/PF 2 x 10 Seated hip abduction with core/PF 2 x 10 Seated hip march with core/PF 2 x 10 Seated shoulder flexion 2 x 10 5lb weight  03/23/23 Neuromuscular re-education: Diaphragmatic breathing training Pelvic floor contraction training review Transversus abdominus training with multimodal cues for improved motor control and breath coordination Seated core facilitation exercises 2 x 10 Mindfulness (information given)   03/14/23  EVAL  Neuromuscular re-education: Pelvic floor muscle contraction training Quick flicks Long holds Urge drill Double-voiding   PATIENT EDUCATION:  Education details: See above Person educated: Patient Education method: Explanation, Demonstration, Tactile cues, Verbal cues, and Handouts Education comprehension:  verbalized understanding  HOME EXERCISE PROGRAM: [id number]  ASSESSMENT:  CLINICAL IMPRESSION: Pt has made some very good improvements in bladder control over the last several weeks. She has been able to work on HEP regularly and it shows with her increase in breath/deep core coordination. She did very well with seated core activation exercises today, demonstrating good understanding of core facilitation. HEP not updated today. We did She will continue to benefit from skilled PT intervention in order to improve bladder/bowel control and begin functional strengthening program in order to improve quality of life.   OBJECTIVE IMPAIRMENTS: decreased activity tolerance, decreased coordination, decreased endurance, decreased strength, increased  fascial restrictions, increased muscle spasms, impaired tone, postural dysfunction, and pain.   ACTIVITY LIMITATIONS: continence  PARTICIPATION LIMITATIONS: community activity  PERSONAL FACTORS: 1 comorbidity: medical history  are also affecting patient's functional outcome.   REHAB POTENTIAL: Good  CLINICAL DECISION MAKING: Stable/uncomplicated  EVALUATION COMPLEXITY: Low   GOALS: Goals reviewed with patient? Yes  SHORT TERM GOALS: Target date: 04/11/23 - updated 04/26/23  Pt will be independent with HEP.   Baseline: Goal status: IN PROGRESS  2.  Pt will be independent with the knack, urge suppression technique, and double voiding in order to improve bladder habits and decrease urinary incontinence.   Baseline:  Goal status: IN PROGRESS  3.  Pt will be independent with use of squatty potty, relaxed toileting mechanics, and improved bowel movement techniques in order to increase ease of bowel movements and complete evacuation.   Baseline:  Goal status: IN PROGRESS  4.  Pt will be able to correctly perform diaphragmatic breathing and appropriate pressure management in order to prevent worsening vaginal wall laxity and improve pelvic floor A/ROM.   Baseline:  Goal status: IN PROGRESS   LONG TERM GOALS: Target date: 05/09/23 - updated 04/26/23  Pt will be independent with advanced HEP.   Baseline:  Goal status: IN PROGRESS  2.  Pt will demonstrate normal pelvic floor muscle tone and A/ROM, able to achieve 4/5 strength with contractions and 10 sec endurance, in order to provide appropriate lumbopelvic support in functional activities.   Baseline:  Goal status: IN PROGRESS  3.  Pt will be able to go 2-3 hours in between voids without urgency or incontinence in order to improve QOL and perform all functional activities with less difficulty.   Baseline:  Goal status: IN PROGRESS  4.  Pt will report no episodes of urinary or fecal incontinence in order to improve  confidence in community activities and personal hygiene.   Baseline:  Goal status: IN PROGRESS  5.  Pt will report no greater than 1/10 Lt hip pain.  Baseline:  Goal status: IN PROGRESS   PLAN:  PT FREQUENCY: 1-2x/week  PT DURATION: 6 months  PLANNED INTERVENTIONS: Therapeutic exercises, Therapeutic activity, Neuromuscular re-education, Balance training, Gait training, Patient/Family education, Self Care, Joint mobilization, Dry Needling, Biofeedback, and Manual therapy  PLAN FOR NEXT SESSION: Progress core training; bladder retraining? Re-evaluation   Julio Alm, PT, DPT09/10/242:52 PM

## 2023-05-01 DIAGNOSIS — G4733 Obstructive sleep apnea (adult) (pediatric): Secondary | ICD-10-CM | POA: Diagnosis not present

## 2023-05-01 DIAGNOSIS — R400 Somnolence: Secondary | ICD-10-CM | POA: Diagnosis not present

## 2023-05-03 ENCOUNTER — Ambulatory Visit: Payer: PPO

## 2023-05-03 ENCOUNTER — Inpatient Hospital Stay: Payer: PPO | Attending: Nurse Practitioner | Admitting: Adult Health

## 2023-05-03 ENCOUNTER — Encounter: Payer: Self-pay | Admitting: Adult Health

## 2023-05-03 VITALS — BP 78/45 | HR 87 | Temp 97.5°F | Resp 18 | Ht 63.0 in | Wt 293.8 lb

## 2023-05-03 DIAGNOSIS — C50311 Malignant neoplasm of lower-inner quadrant of right female breast: Secondary | ICD-10-CM | POA: Diagnosis not present

## 2023-05-03 DIAGNOSIS — Z8049 Family history of malignant neoplasm of other genital organs: Secondary | ICD-10-CM | POA: Insufficient documentation

## 2023-05-03 DIAGNOSIS — Z803 Family history of malignant neoplasm of breast: Secondary | ICD-10-CM | POA: Diagnosis not present

## 2023-05-03 DIAGNOSIS — M85831 Other specified disorders of bone density and structure, right forearm: Secondary | ICD-10-CM | POA: Insufficient documentation

## 2023-05-03 DIAGNOSIS — Z87891 Personal history of nicotine dependence: Secondary | ICD-10-CM | POA: Insufficient documentation

## 2023-05-03 DIAGNOSIS — Z170 Estrogen receptor positive status [ER+]: Secondary | ICD-10-CM | POA: Diagnosis not present

## 2023-05-03 DIAGNOSIS — N3946 Mixed incontinence: Secondary | ICD-10-CM

## 2023-05-03 DIAGNOSIS — R3915 Urgency of urination: Secondary | ICD-10-CM

## 2023-05-03 DIAGNOSIS — Z79811 Long term (current) use of aromatase inhibitors: Secondary | ICD-10-CM | POA: Insufficient documentation

## 2023-05-03 DIAGNOSIS — M25552 Pain in left hip: Secondary | ICD-10-CM

## 2023-05-03 DIAGNOSIS — M6281 Muscle weakness (generalized): Secondary | ICD-10-CM

## 2023-05-03 DIAGNOSIS — R279 Unspecified lack of coordination: Secondary | ICD-10-CM

## 2023-05-03 NOTE — Progress Notes (Unsigned)
SURVIVORSHIP VISIT:  BRIEF ONCOLOGIC HISTORY:  Oncology History  Malignant neoplasm of lower-inner quadrant of right breast of female, estrogen receptor positive (HCC)  10/07/2022 Initial Diagnosis   Screening mammogram detected right breast mass LIQ 0.8 cm, axilla negative, biopsy revealed grade 2 IDC ER 100%, PR 100%, Ki67 5%, HER2 3+ positive   10/18/2022 Cancer Staging   Staging form: Breast, AJCC 8th Edition - Clinical stage from 10/18/2022: Stage IA (cT1b, cN0, cM0, G2, ER+, PR+, HER2+) - Signed by Ronny Bacon, PA-C on 10/18/2022 Stage prefix: Initial diagnosis Method of lymph node assessment: Clinical Histologic grading system: 3 grade system    Genetic Testing   [facility] Common Cancer Panel+RNA was Negative. Of note, a variant of uncertain significance was detected in the AXIN2 gene (c.1481C>T) and the TSC2 gene (c.4794C>A). Report date is 10/28/2022.  The Common Hereditary Cancers Panel offered by [facility] includes sequencing and/or deletion duplication testing of the following 48 genes: APC, ATM, AXIN2, BAP1, BARD1, BMPR1A, BRCA1, BRCA2, BRIP1, CDH1, CDK4, CDKN2A (p14ARF and p16INK4a only), CHEK2, CTNNA1, DICER1, EPCAM (Deletion/duplication testing only), FH, GREM1 (promoter region duplication testing only), HOXB13, KIT, MBD4, MEN1, MLH1, MSH2, MSH3, MSH6, MUTYH, NF1, NHTL1, PALB2, PDGFRA, PMS2, POLD1, POLE, PTEN, RAD51C, RAD51D, SDHA (sequencing analysis only except exon 14), SDHB, SDHC, SDHD, SMAD4, SMARCA4. STK11, TP53, TSC1, TSC2, and VHL.   11/19/2022 Surgery   Right lumpectomy: Grade 1 IDC 1.9 cm with intermediate grade DCIS, margins negative, ALH, sentinel lymph node: Benign fibroconnective tissue no lymph node identified.  ER 100%, PR 100%, HER2 3+ positive, Ki-67 5%   12/16/2022 Cancer Staging   Staging form: Breast, AJCC 8th Edition - Pathologic stage from 12/16/2022: Stage IA (pT1c, pN0(sn), cM0, G1, ER+, PR+, HER2-, Oncotype DX score: 15) - Signed by Ronny Bacon,  PA-C on 12/16/2022 Stage prefix: Initial diagnosis Method of lymph node assessment: Sentinel lymph node biopsy Multigene prognostic tests performed: Oncotype DX Recurrence score range: Greater than or equal to 11 Histologic grading system: 3 grade system   12/29/2022 - 01/27/2023 Radiation Therapy   Plan Name: Breast_R Site: Breast, Right Technique: 3D Mode: Photon Dose Per Fraction: 2.66 Gy Prescribed Dose (Delivered / Prescribed): 42.56 Gy / 42.56 Gy Prescribed Fxs (Delivered / Prescribed): 16 / 16   Plan Name: Breast_R_Bst Site: Breast, Right Technique: 3D Mode: Photon Dose Per Fraction: 2 Gy Prescribed Dose (Delivered / Prescribed): 8 Gy / 8 Gy Prescribed Fxs (Delivered / Prescribed): 4 / 4   01/2023 -  Anti-estrogen oral therapy   Anastrozole     INTERVAL HISTORY:  Ms. Sieminski to review her survivorship care plan detailing her treatment course for breast cancer, as well as monitoring long-term side effects of that treatment, education regarding health maintenance, screening, and overall wellness and health promotion.     Overall, Ms. Ya reports feeling quite well   REVIEW OF SYSTEMS:  Review of Systems - Oncology Breast: Denies any new nodularity, masses, tenderness, nipple changes, or nipple discharge.       PAST MEDICAL/SURGICAL HISTORY:  Past Medical History:  Diagnosis Date  . ADHD (attention deficit hyperactivity disorder)   . Anemia   . Anxiety   . Arthritis   . Depression   . GERD (gastroesophageal reflux disease)   . History of panic attacks   . Hyperlipidemia   . Hypertension   . Hypothyroidism, postsurgical   . Pre-diabetes   . SBO (small bowel obstruction) (HCC) 01/15/2020  . Sleep apnea   . SUI (stress urinary incontinence,  female)   . Wears glasses    Past Surgical History:  Procedure Laterality Date  . BREAST BIOPSY Right 10/07/2022  . BREAST BIOPSY Right 10/07/2022   [place] RT BREAST BX W LOC DEV 1ST LESION IMG BX SPEC US GUIDE 10/07/2022  [facility]  . BREAST BIOPSY  11/18/2022   MM RT RADIOACTIVE SEED LOC MAMMO GUIDE 11/18/2022 [facility]MO[facility]  . BREAST LUMPECTOMY WITH RADIOACTIVE SEED AND SENTINEL LYMPH NODE BIOPSY Right 11/19/2022   Procedure: RIGHT BREAST LUMPECTOMY WITH RADIOACTIVE SEED;  Surgeon: [name], MD;  Location: [facility] OR;  Service: General;  Laterality: Right;  . CATARACT EXTRACTION Bilateral    2023  . D & C HYSTEROSCOPY W/ RESECTION ENDOMETRIAL POLYP  04/15/2009  . D & C HYSTEROSCOPY W/ RESECTOSCOPE AND THERMACHOICE ENDOMETRIAL ABLATION  12/09/2010  . EXCISION MELANOMA WITH SENTINEL LYMPH NODE BIOPSY Right 11/19/2022   Procedure: SENTINEL LYMPH NODE BIOPSY;  Surgeon: [name], MD;  Location[facility] OR;  Service: General;  Laterality: Right;  . LAPAROSCOPIC ASSISTED VENTRAL HERNIA REPAIR Left 01/16/2020   Procedure: LAPAROSCOPIC ASSISTED HERNIA REPAIR;  Surgeon: Berna Bue, MD;  Location: [facility] OR;  Service: General;  Laterality: Left;  . PUBOVAGINAL SLING N/A 07/26/2016   Procedure: PUBO-VAGINAL SLING ALTIS SLING;  Surgeon: Jethro Bolus, MD;  Location: [facility];  Service: Urology;  Laterality: N/A;  . REPAIR SPIGELIAN HERNIA    . RESECTION GIANT CELL TUMOR RIGHT LONG FINGER  09/13/2008  . REVERSE SHOULDER ARTHROPLASTY Right 07/16/2021   Procedure: REVERSE SHOULDER ARTHROPLASTY;  Surgeon: Jones Broom, MD;  Location: [facility] ORS;  Service: Orthopedics;  Laterality: Right;  . ROTATOR CUFF REPAIR Bilateral   . THYROIDECTOMY  1979    [facility]   non-malignant diseased thyroid  . THYROIDECTOMY Left 03/17/2021   Procedure: COMPLETION THYROIDECTOMY, LEFT LOBE;  Surgeon: Darnell Level, MD;  Location: [facility] OR[facility];  Service: General;  Laterality: Left;  . TOTAL KNEE ARTHROPLASTY Right 12/17/2019   Procedure: RIGHT TOTAL KNEE ARTHROPLASTY;  Surgeon: Gean Birchwood, MD;  Location: [facility] OR[facility];  Service: Orthopedics;  Laterality: Right;  . TOTAL KNEE ARTHROPLASTY Left 03/03/2020   Procedure:  LEFT TOTAL KNEE ARTHROPLASTY;  Surgeon: Gean Birchwood, MD;  Location: [facility];  Service: Orthopedics;  Laterality: Left;  . TRIPLE ARTHRODESIS LEFT FOOT  04/20/2007  . VENTRAL HERNIA REPAIR  2003  . WRIST GANGLION EXCISION Right 1975     ALLERGIES:  Allergies  Allergen Reactions  . Ceclor [Cefaclor] Hives and Shortness Of Breath  . Bactrim [Sulfamethoxazole-Trimethoprim] Swelling  . Gabapentin Swelling     CURRENT MEDICATIONS:  Outpatient Encounter Medications as of 05/03/2023  Medication Sig Note  . ALPRAZolam (XANAX) 0.5 MG tablet Take 0.5 mg by mouth daily as needed for anxiety.   Marland Kitchen anastrozole (ARIMIDEX) 1 MG tablet Take 1 tablet (1 mg total) by mouth daily.   Marland Kitchen buPROPion (WELLBUTRIN XL) 150 MG 24 hr tablet Take 150 mg by mouth daily.   Marland Kitchen CALCIUM PO Take 4 tablets by mouth daily. Mini Calcium Total dose 1200 mg   . Cholecalciferol (VITAMIN D3) 125 MCG (5000 UT) TABS Take 5,000 Units by mouth daily.   . DULoxetine (CYMBALTA) 30 MG capsule Take 30 mg by mouth daily. 11/05/2022: 90 mg total   . DULoxetine (CYMBALTA) 60 MG capsule Take 60 mg by mouth in the morning. 11/05/2022: 90 mg total   . ferrous sulfate 325 (65 FE) MG tablet Take 325 mg by mouth.   . irbesartan (AVAPRO) 75 MG tablet  Take 75 mg by mouth in the morning.   . lamoTRIgine (LAMICTAL) 25 MG tablet Take 25 mg by mouth 2 (two) times daily.   Marland Kitchen levothyroxine (SYNTHROID) 137 MCG tablet Take 137 mcg by mouth daily before breakfast.   . lisdexamfetamine (VYVANSE) 50 MG capsule Take 50 mg by mouth in the morning.   . Multiple Vitamin (MULTIVITAMIN) capsule Take 2 capsules by mouth daily.   Marland Kitchen MYRBETRIQ 50 MG TB24 tablet Take 50 mg by mouth in the morning.   . nystatin cream (MYCOSTATIN) Apply 1/2" cream to base of left base of neck and in the fold below the breast until rash resolves   . omeprazole (PRILOSEC) 40 MG capsule Take 20 mg by mouth daily.   . simvastatin (ZOCOR) 40 MG tablet Take 40 mg by mouth in the morning.   .  vitamin k 100 MCG tablet Take 100 mcg by mouth daily.   Marland Kitchen Zoster Vaccine Adjuvanted [facility]) injection    . [DISCONTINUED] ibuprofen (ADVIL) 200 MG tablet Take 400-600 mg by mouth every 8 (eight) hours as needed for moderate pain.   . [DISCONTINUED] oxyCODONE-acetaminophen (PERCOCET/ROXICET) 5-325 MG tablet Take 1 tablet by mouth every 8 (eight) hours as needed for severe pain.   . [DISCONTINUED] silver sulfADIAZINE (SILVADENE) 1 % cream Apply 1 Application topically daily.    No facility-administered encounter medications on file as of 05/03/2023.     ONCOLOGIC FAMILY HISTORY:  Family History  Problem Relation Age of Onset  . Breast cancer Mother 59  . Uterine cancer Mother   . Breast cancer Sister 33  . Diabetes Maternal Grandfather   . Heart disease Paternal Grandfather      SOCIAL HISTORY:  Social History   Socioeconomic History  . Marital status: Married    Spouse name: Not on file  . Number of children: Not on file  . Years of education: Not on file  . Highest education level: Not on file  Occupational History  . Not on file  Tobacco Use  . Smoking status: Former    Current packs/day: 0.00    Types: Cigarettes    Start date: 09/21/1968    Quit date: 09/21/1974    Years since quitting: 48.6  . Smokeless tobacco: Never  Vaping Use  . Vaping status: Never Used  Substance and Sexual Activity  . Alcohol use: Not Currently    Comment: very rare  . Drug use: No  . Sexual activity: Yes    Birth control/protection: Post-menopausal  Other Topics Concern  . Not on file  Social History Narrative  . Not on file   Social Determinants of Health   Financial Resource Strain: Not on file  Food Insecurity: No Food Insecurity (12/16/2022)   Hunger Vital Sign   . Worried About Programme researcher, broadcasting/film/video in the Last Year: Never true   . Ran Out of Food in the Last Year: Never true  Transportation Needs: No Transportation Needs (12/16/2022)   PRAPARE - Transportation   . Lack of  Transportation (Medical): No   . Lack of Transportation (Non-Medical): No  Physical Activity: Not on file  Stress: Not on file  Social Connections: Not on file  Intimate Partner Violence: Not At Risk (12/16/2022)   Humiliation, Afraid, Rape, and Kick questionnaire   . Fear of Current or Ex-Partner: No   . Emotionally Abused: No   . Physically Abused: No   . Sexually Abused: No     OBSERVATIONS/OBJECTIVE:  BP (!) 78/45 (BP  Location: Left Arm, Patient Position: Sitting) Comment: 149/62 RW  Pulse 87   Temp (!) 97.5 F (36.4 C) (Tympanic)   Resp 18   Ht 5\' 3"  (1.6 m)   Wt 293 lb 12.8 oz (133.3 kg)   SpO2 100%   BMI 52.04 kg/m  GENERAL: Patient is a well appearing female in no acute distress HEENT:  Sclerae anicteric.  Oropharynx clear and moist. No ulcerations or evidence of oropharyngeal candidiasis. Neck is supple.  NODES:  No cervical, supraclavicular, or axillary lymphadenopathy palpated.  BREAST EXAM:  Deferred. LUNGS:  Clear to auscultation bilaterally.  No wheezes or rhonchi. HEART:  Regular rate and rhythm. No murmur appreciated. ABDOMEN:  Soft, nontender.  Positive, normoactive bowel sounds. No organomegaly palpated. MSK:  No focal spinal tenderness to palpation. Full range of motion bilaterally in the upper extremities. EXTREMITIES:  No peripheral edema.   SKIN:  Clear with no obvious rashes or skin changes. No nail dyscrasia. NEURO:  Nonfocal. Well oriented.  Appropriate affect.   LABORATORY DATA:  None for this visit.  DIAGNOSTIC IMAGING:  None for this visit.      ASSESSMENT AND PLAN:  Ms.. Silas is a pleasant 72 y.o. female with Stage *** right/left breast invasive ductal carcinoma, ER+/PR+/HER2-, diagnosed in ***, treated with lumpectomy, adjuvant radiation therapy, and anti-estrogen therapy with *** beginning in ***.  She presents to the Survivorship Clinic for our initial meeting and routine follow-up post-completion of treatment for breast cancer.    1.  Stage *** right/left breast cancer:  Ms. Rojek is continuing to recover from definitive treatment for breast cancer. She will follow-up with her medical oncologist, Dr.  Marland Kitchen with history and physical exam per surveillance protocol.  She will continue her anti-estrogen therapy with ***. Thus far, she is tolerating the *** well, with minimal side effects. Her mammogram is due ***; orders placed today.   Today, a comprehensive survivorship care plan and treatment summary was reviewed with the patient today detailing her breast cancer diagnosis, treatment course, potential late/long-term effects of treatment, appropriate follow-up care with recommendations for the future, and patient education resources.  A copy of this summary, along with a letter will be sent to the patient's primary care provider via mail/fax/In Basket message after today's visit.    #. Problem(s) at Visit______________  #. Bone health:  Given Ms. Badie's age/history of breast cancer and her current treatment regimen including anti-estrogen therapy with ***, she is at risk for bone demineralization.  Her last DEXA scan was ***, which showed ***.  In the meantime, she was encouraged to increase her consumption of foods rich in calcium, as well as increase her weight-bearing activities.  She was given education on specific activities to promote bone health.  #. Cancer screening:  Due to Ms. Fabrizio's history and her age, she should receive screening for skin cancers, colon cancer, and gynecologic cancers.  The information and recommendations are listed on the patient's comprehensive care plan/treatment summary and were reviewed in detail with the patient.    #. Health maintenance and wellness promotion: Ms. Dorko was encouraged to consume 5-7 servings of fruits and vegetables per day. We reviewed the "Nutrition Rainbow" handout.  She was also encouraged to engage in moderate to vigorous exercise for 30 minutes per day most days of the week.  She  was instructed to limit her alcohol consumption and continue to abstain from tobacco use/***was encouraged stop smoking.     #. Support services/counseling: It is not uncommon  for this period of the patient's cancer care trajectory to be one of many emotions and stressors.   She was given information regarding our available services and encouraged to contact me with any questions or for help enrolling in any of our support group/programs.    Follow up instructions:    -Return to cancer center ***  -Mammogram due in *** -She is welcome to return back to the Survivorship Clinic at any time; no additional follow-up needed at this time.  -Consider referral back to survivorship as a long-term survivor for continued surveillance  The patient was provided an opportunity to ask questions and all were answered. The patient agreed with the plan and demonstrated an understanding of the instructions.   Total encounter time:*** minutes*in face-to-face visit time, chart review, lab review, care coordination, order entry, and documentation of the encounter time.    Lillard Anes, NP 05/03/23 2:18 PM Medical Oncology and Hematology [facility] [address] [place], [place] [address] Tel. [phone]    Fax. [phone]  *Total Encounter Time as defined by the Centers for Medicare and Medicaid Services includes, in addition to the face-to-face time of a patient visit (documented in the note above) non-face-to-face time: obtaining and reviewing outside history, ordering and reviewing medications, tests or procedures, care coordination (communications with other health care professionals or caregivers) and documentation in the medical record.

## 2023-05-03 NOTE — Therapy (Signed)
OUTPATIENT PHYSICAL THERAPY FEMALE PELVIC EVALUATION   Patient Name: [name] [name] MRN: [id number] DOB:11-Jan-1951, 72 y.o., female Today's Date: 05/03/2023  END OF SESSION:  PT End of Session - 05/03/23 1157     Visit Number 4    Date for PT Re-Evaluation 06/28/23    Authorization Type HTA    Progress Note Due on Visit 10    PT Start Time 1156    PT Stop Time 1230    PT Time Calculation (min) 34 min    Activity Tolerance Patient tolerated treatment well    Behavior During Therapy WFL for tasks assessed/performed              Past Medical History:  Diagnosis Date   ADHD (attention deficit hyperactivity disorder)    Anemia    Anxiety    Arthritis    Depression    GERD (gastroesophageal reflux disease)    History of panic attacks    Hyperlipidemia    Hypertension    Hypothyroidism, postsurgical    Pre-diabetes    SBO (small bowel obstruction) (HCC) 01/15/2020   Sleep apnea    SUI (stress urinary incontinence, female)    Wears glasses    Past Surgical History:  Procedure Laterality Date   BREAST BIOPSY Right 10/07/2022   BREAST BIOPSY Right 10/07/2022   [place] RT BREAST BX W LOC DEV 1ST LESION IMG BX SPEC US GUIDE 10/07/2022 [facility]   BREAST BIOPSY  11/18/2022   MM RT RADIOACTIVE SEED LOC MAMMO GUIDE 11/18/2022 [facility]   BREAST LUMPECTOMY WITH RADIOACTIVE SEED AND SENTINEL LYMPH NODE BIOPSY Right 11/19/2022   Procedure: RIGHT BREAST LUMPECTOMY WITH RADIOACTIVE SEED;  Surgeon: [name], MD;  Location: [facility] OR;  Service: General;  Laterality: Right;   CATARACT EXTRACTION Bilateral    2023   D & C HYSTEROSCOPY W/ RESECTION ENDOMETRIAL POLYP  04/15/2009   D & C HYSTEROSCOPY W/ RESECTOSCOPE AND THERMACHOICE ENDOMETRIAL ABLATION  12/09/2010   EXCISION MELANOMA WITH SENTINEL LYMPH NODE BIOPSY Right 11/19/2022   Procedure: SENTINEL LYMPH NODE BIOPSY;  Surgeon: [name], MD;  Location: [facility] OR;  Service: General;  Laterality: Right;    LAPAROSCOPIC ASSISTED VENTRAL HERNIA REPAIR Left 01/16/2020   Procedure: LAPAROSCOPIC ASSISTED HERNIA REPAIR;  Surgeon: Berna Bue, MD;  Location: [facility] OR;  Service: General;  Laterality: Left;   PUBOVAGINAL SLING N/A 07/26/2016   Procedure: PUBO-VAGINAL SLING ALTIS SLING;  Surgeon: Jethro Bolus, MD;  Location: [facility];  Service: Urology;  Laterality: N/A;   REPAIR SPIGELIAN HERNIA     RESECTION GIANT CELL TUMOR RIGHT LONG FINGER  09/13/2008   REVERSE SHOULDER ARTHROPLASTY Right 07/16/2021   Procedure: REVERSE SHOULDER ARTHROPLASTY;  Surgeon: Jones Broom, MD;  Location: [facility];  Service: Orthopedics;  Laterality: Right;   ROTATOR CUFF REPAIR Bilateral    THYROIDECTOMY  1979    [facility]   non-malignant diseased thyroid   THYROIDECTOMY Left 03/17/2021   Procedure: COMPLETION THYROIDECTOMY, LEFT LOBE;  Surgeon: Darnell Level, MD;  Location: [facility];  Service: General;  Laterality: Left;   TOTAL KNEE ARTHROPLASTY Right 12/17/2019   Procedure: RIGHT TOTAL KNEE ARTHROPLASTY;  Surgeon: Gean Birchwood, MD;  Location: [facility];  Service: Orthopedics;  Laterality: Right;   TOTAL KNEE ARTHROPLASTY Left 03/03/2020   Procedure: LEFT TOTAL KNEE ARTHROPLASTY;  Surgeon: Gean Birchwood, MD;  Location: [facility];  Service: Orthopedics;  Laterality: Left;   TRIPLE ARTHRODESIS LEFT FOOT  04/20/2007   VENTRAL HERNIA REPAIR  2003  WRIST GANGLION EXCISION Right 1975   Patient Active Problem List   Diagnosis Date Noted   Genetic testing 11/01/2022   Family history of breast cancer 10/21/2022   Malignant neoplasm of lower-inner quadrant of right breast of female, estrogen receptor positive (HCC) 10/18/2022   Neoplasm of uncertain behavior of thyroid gland 03/15/2021   Left thyroid nodule 03/15/2021   S/P TKR (total knee replacement) using cement, left 03/03/2020   Degenerative arthritis of left knee 02/29/2020   SBO (small bowel obstruction) (HCC) 01/15/2020   S/P TKR (total knee  replacement), right 12/19/2019   Total knee replacement status, right 12/17/2019   Osteoarthritis of right knee 12/14/2019   Obesity, morbid (HCC) 12/16/2016   Strain of iliopsoas muscle, initial encounter 07/15/2016   Elevated cholesterol    Urinary incontinence    Endometrial polyp    Primary osteoarthritis of knees, bilateral 05/15/2009   DERANGEMENT OF ANTERIOR HORN OF LATERAL MENISCUS 05/15/2009   KNEE PAIN 05/15/2009    PCP: Wilfrid Lund, PA  REFERRING PROVIDER: Serena Croissant, MD  REFERRING DIAG: C50.311,Z17.0 (ICD-10-CM) - Malignant neoplasm of lower-inner quadrant of right breast of female, estrogen receptor positive (HCC) M25.511 (ICD-10-CM) - Pain in joint of right shoulder R32 (ICD-10-CM) - Urinary incontinence, unspecified type   THERAPY DIAG:  Urinary urgency  Muscle weakness (generalized)  Pain in left hip  Unspecified lack of coordination  Mixed incontinence  Rationale for Evaluation and Treatment: Rehabilitation  ONSET DATE: chronic   SUBJECTIVE:                                                                                                                                                                                           SUBJECTIVE STATEMENT: Pt reports that she has been doing well. Kotex has been staying dry during daily life, is completely saturated when she sleeps too long. She at times exercises 8 times/ day. Pt reports that she has a large diet coke and has water. Goes to sleep 2-3 a.m. and wakes up at 10 a.m. Pt reports that she leaks in the morning or pad leaks. Is not sure when exactly. Pt reports that she always lies on her back due to previous knee surgery. Drinks water unil she goes to sleep. Pt reports that she has had rough 3 years and is planning on losing weight and get back in shape. She had a cataract sx in the last year and has been clumsy since then.   PAIN:  Are you having pain? Yes NPRS scale: 0/10 Pain location:  Lt hip  pain  Pain type: discomfort, tight Pain description: intermittent   Aggravating factors:  prolonged sitting Relieving factors: movement, lying down   PRECAUTIONS: None  RED FLAGS: None   WEIGHT BEARING RESTRICTIONS: No  FALLS:  Has patient fallen in last 6 months? No  LIVING ENVIRONMENT: Lives with: lives with their family Lives in: House/apartment  OCCUPATION: not discussed  PLOF: Independent  PATIENT GOALS: improve bladder control   PERTINENT HISTORY:  Breast cancer, small bowel obstruction, anxiety, ADHD, hernia repair Lt x 2, spigelian hernia, triple arthrodesis Lt foot Sexual abuse: No  BOWEL MOVEMENT: Pain with bowel movement: No Type of bowel movement:Frequency generally every day or every other day and Strain No Fully empty rectum: Yes: - Leakage: Yes: in the last 2 months feels like it is related to water - has trouble reaching Pads: Yes: see below Fiber supplement: No Uses water to cleanse after bowel movement  URINATION: Pain with urination: No Fully empty bladder: Yes: - Stream: Strong Urgency: Yes: will fill pad on the way to the bathroom in the morning Frequency: will get distracted when she is needle pointing or working on other things and suppresses urge too long; only gets up 1x/night, every 3 hours during the day, before she goes anywhere Leakage: Urge to void, Coughing, Sneezing, and any jerking motion like in the car Pads: Yes: pad 100% of the time and brief if she is going out  INTERCOURSE: Pain with intercourse: no pain with intimate activities   PREGNANCY: Vaginal deliveries 2 Tearing Yes: unsure  PROLAPSE: none  OBJECTIVE:  05/03/23: No formal pelvic floor exam this session Pt with significantly improved pressure management during sit<>stand using exhale as she rises Good coordination with pelvic floor/transversus abdominus activation in seated without other muscle recruitment  03/14/23: COGNITION: Overall cognitive status:  Within functional limits for tasks assessed     SENSATION: Light touch: Appears intact Proprioception: Appears intact  FUNCTIONAL TESTS:  Breath holding with transitions BUE assist for sit<>stand  GAIT: Comments: mild compensated trendelenburg   POSTURE: rounded shoulders, forward head, increased lumbar lordosis, increased thoracic kyphosis, anterior pelvic tilt, and flexed trunk    PALPATION:   General  No tenderness                External Perineal Exam increased redness                             Internal Pelvic Floor no tenderness, WNL  Patient confirms identification and approves PT to assess internal pelvic floor and treatment Yes  PELVIC MMT:   MMT eval  Vaginal 1/5, 4 second endurance, 5 repeat contractions   (Blank rows = not tested)        TONE: low  PROLAPSE: Mild anterior vaginal wall laxity   TODAY'S TREATMENT:                                                                                                                              DATE:  05/03/23 There act: pt education on bladder irritants, slower hydration during the day, pacing herself, stopping drinking water 3 hrs before bedtime.  Doing urge drill when she gets up in the morning on the way to the bathroom, going back to bed as needed.    04/26/23 Neuromuscular re-education: Seated hip adduction with core/PF 2 x 10 Seated hip abduction with core/PF 2 x 10 Seated hip march with core/PF 2 x 10 Seated shoulder flexion 2 x 10 5lb weight  03/23/23 Neuromuscular re-education: Diaphragmatic breathing training Pelvic floor contraction training review Transversus abdominus training with multimodal cues for improved motor control and breath coordination Seated core facilitation exercises 2 x 10 Mindfulness (information given)     PATIENT EDUCATION:  Education details: See above Person educated: Patient Education method: Explanation, Demonstration, Tactile cues, Verbal cues, and  Handouts Education comprehension: verbalized understanding  HOME EXERCISE PROGRAM: [id number]  ASSESSMENT:  CLINICAL IMPRESSION: Pt did well with urge suppression and bladder irritants education. Pt agreeable to bladder retraining with timing of fluid intake prior to bed and steady fluid intake throughout the day. Believe this will help with overflow incontinence in the morning. She will attempt getting out of bed in timely manner in order tp reduce incontinence in the morning. She is seeing good progress overall in initial couple of sessions with her pad staying dry throughout the day. Her biggest concern right now is having leaking in the morning. She will continue to benefit from skilled PT intervention in order to improve bladder/bowel control and begin functional strengthening program in order to improve quality of life.   OBJECTIVE IMPAIRMENTS: decreased activity tolerance, decreased coordination, decreased endurance, decreased strength, increased fascial restrictions, increased muscle spasms, impaired tone, postural dysfunction, and pain.   ACTIVITY LIMITATIONS: continence  PARTICIPATION LIMITATIONS: community activity  PERSONAL FACTORS: 1 comorbidity: medical history  are also affecting patient's functional outcome.   REHAB POTENTIAL: Good  CLINICAL DECISION MAKING: Stable/uncomplicated  EVALUATION COMPLEXITY: Low   GOALS: Goals reviewed with patient? Yes  SHORT TERM GOALS: Target date: 04/11/23 - updated 04/26/23 - updated 05/03/23  Pt will be independent with HEP.   Baseline: Goal status:MET 05/03/23  2.  Pt will be independent with the knack, urge suppression technique, and double voiding in order to improve bladder habits and decrease urinary incontinence.   Baseline:  Goal status: MET 05/03/23  3.  Pt will be independent with use of squatty potty, relaxed toileting mechanics, and improved bowel movement techniques in order to increase ease of bowel movements and  complete evacuation.   Baseline:  Goal status: MET 05/03/23  4.  Pt will be able to correctly perform diaphragmatic breathing and appropriate pressure management in order to prevent worsening vaginal wall laxity and improve pelvic floor A/ROM.   Baseline:  Goal status: MET 05/03/23   LONG TERM GOALS: Target date: 05/09/23 - updated 04/26/23 - updated 05/03/23  Pt will be independent with advanced HEP.   Baseline:  Goal status: IN PROGRESS  2.  Pt will demonstrate normal pelvic floor muscle tone and A/ROM, able to achieve 4/5 strength with contractions and 10 sec endurance, in order to provide appropriate lumbopelvic support in functional activities.   Baseline:  Goal status: IN PROGRESS  3.  Pt will be able to go 2-3 hours in between voids without urgency or incontinence in order to improve QOL and perform all functional activities with less difficulty.   Baseline:  Goal status: IN PROGRESS  4.  Pt will report no episodes of  urinary or fecal incontinence in order to improve confidence in community activities and personal hygiene.   Baseline:  Goal status: IN PROGRESS  5.  Pt will report no greater than 1/10 Lt hip pain.  Baseline:  Goal status: IN PROGRESS   PLAN:  PT FREQUENCY: 1-2x/week  PT DURATION: 6 months  PLANNED INTERVENTIONS: Therapeutic exercises, Therapeutic activity, Neuromuscular re-education, Balance training, Gait training, Patient/Family education, Self Care, Joint mobilization, Dry Needling, Biofeedback, and Manual therapy  PLAN FOR NEXT SESSION: Progress core training; how is bladder retraining/timing of fluids going? Morning incontinence?   Julio Alm, PT, DPT09/17/241:19 PM

## 2023-05-12 ENCOUNTER — Ambulatory Visit: Payer: PPO

## 2023-05-12 DIAGNOSIS — R3915 Urgency of urination: Secondary | ICD-10-CM | POA: Diagnosis not present

## 2023-05-12 DIAGNOSIS — R279 Unspecified lack of coordination: Secondary | ICD-10-CM

## 2023-05-12 DIAGNOSIS — M6281 Muscle weakness (generalized): Secondary | ICD-10-CM

## 2023-05-12 NOTE — Therapy (Signed)
OUTPATIENT PHYSICAL THERAPY FEMALE PELVIC EVALUATION   Patient Name: [name] [name] MRN: [id number] DOB:1950/11/27, 72 y.o., female Today's Date: 05/12/2023  END OF SESSION:  PT End of Session - 05/12/23 1234     Visit Number 5    Date for PT Re-Evaluation 06/28/23    Authorization Type HTA    Progress Note Due on Visit 10    PT Start Time 1234    PT Stop Time 1312    PT Time Calculation (min) 38 min    Activity Tolerance Patient tolerated treatment well    Behavior During Therapy WFL for tasks assessed/performed              Past Medical History:  Diagnosis Date   ADHD (attention deficit hyperactivity disorder)    Anemia    Anxiety    Arthritis    Depression    GERD (gastroesophageal reflux disease)    History of panic attacks    Hyperlipidemia    Hypertension    Hypothyroidism, postsurgical    Pre-diabetes    SBO (small bowel obstruction) (HCC) 01/15/2020   Sleep apnea    SUI (stress urinary incontinence, female)    Wears glasses    Past Surgical History:  Procedure Laterality Date   BREAST BIOPSY Right 10/07/2022   BREAST BIOPSY Right 10/07/2022   [place] RT BREAST BX W LOC DEV 1ST LESION IMG BX SPEC US GUIDE 10/07/2022 [facility]   BREAST BIOPSY  11/18/2022   MM RT RADIOACTIVE SEED LOC MAMMO GUIDE 11/18/2022 [facility]   BREAST LUMPECTOMY WITH RADIOACTIVE SEED AND SENTINEL LYMPH NODE BIOPSY Right 11/19/2022   Procedure: RIGHT BREAST LUMPECTOMY WITH RADIOACTIVE SEED;  Surgeon: [name], MD;  Location: [facility] OR;  Service: General;  Laterality: Right;   CATARACT EXTRACTION Bilateral    2023   D & C HYSTEROSCOPY W/ RESECTION ENDOMETRIAL POLYP  04/15/2009   D & C HYSTEROSCOPY W/ RESECTOSCOPE AND THERMACHOICE ENDOMETRIAL ABLATION  12/09/2010   EXCISION MELANOMA WITH SENTINEL LYMPH NODE BIOPSY Right 11/19/2022   Procedure: SENTINEL LYMPH NODE BIOPSY;  Surgeon: [name], MD;  Location: [facility] OR;  Service: General;  Laterality: Right;    LAPAROSCOPIC ASSISTED VENTRAL HERNIA REPAIR Left 01/16/2020   Procedure: LAPAROSCOPIC ASSISTED HERNIA REPAIR;  Surgeon: Berna Bue, MD;  Location: [facility] OR;  Service: General;  Laterality: Left;   PUBOVAGINAL SLING N/A 07/26/2016   Procedure: PUBO-VAGINAL SLING ALTIS SLING;  Surgeon: Jethro Bolus, MD;  Location: [facility] [place];  Service: Urology;  Laterality: N/A;   REPAIR SPIGELIAN HERNIA     RESECTION GIANT CELL TUMOR RIGHT LONG FINGER  09/13/2008   REVERSE SHOULDER ARTHROPLASTY Right 07/16/2021   Procedure: REVERSE SHOULDER ARTHROPLASTY;  Surgeon: Jones Broom, MD;  Location: [facility];  Service: Orthopedics;  Laterality: Right;   ROTATOR CUFF REPAIR Bilateral    THYROIDECTOMY  1979    [facility]   non-malignant diseased thyroid   THYROIDECTOMY Left 03/17/2021   Procedure: COMPLETION THYROIDECTOMY, LEFT LOBE;  Surgeon: Darnell Level, MD;  Location: [facility];  Service: General;  Laterality: Left;   TOTAL KNEE ARTHROPLASTY Right 12/17/2019   Procedure: RIGHT TOTAL KNEE ARTHROPLASTY;  Surgeon: Gean Birchwood, MD;  Location: [facility];  Service: Orthopedics;  Laterality: Right;   TOTAL KNEE ARTHROPLASTY Left 03/03/2020   Procedure: LEFT TOTAL KNEE ARTHROPLASTY;  Surgeon: Gean Birchwood, MD;  Location: [facility];  Service: Orthopedics;  Laterality: Left;   TRIPLE ARTHRODESIS LEFT FOOT  04/20/2007   VENTRAL HERNIA REPAIR  2003  WRIST GANGLION EXCISION Right 1975   Patient Active Problem List   Diagnosis Date Noted   Genetic testing 11/01/2022   Family history of breast cancer 10/21/2022   Malignant neoplasm of lower-inner quadrant of right breast of female, estrogen receptor positive (HCC) 10/18/2022   Neoplasm of uncertain behavior of thyroid gland 03/15/2021   Left thyroid nodule 03/15/2021   S/P TKR (total knee replacement) using cement, left 03/03/2020   Degenerative arthritis of left knee 02/29/2020   SBO (small bowel obstruction) (HCC) 01/15/2020   S/P TKR (total knee  replacement), right 12/19/2019   Total knee replacement status, right 12/17/2019   Osteoarthritis of right knee 12/14/2019   Obesity, morbid (HCC) 12/16/2016   Strain of iliopsoas muscle, initial encounter 07/15/2016   Elevated cholesterol    Urinary incontinence    Endometrial polyp    Primary osteoarthritis of knees, bilateral 05/15/2009   DERANGEMENT OF ANTERIOR HORN OF LATERAL MENISCUS 05/15/2009   KNEE PAIN 05/15/2009    PCP: Wilfrid Lund, PA  REFERRING PROVIDER: Serena Croissant, MD  REFERRING DIAG: C50.311,Z17.0 (ICD-10-CM) - Malignant neoplasm of lower-inner quadrant of right breast of female, estrogen receptor positive (HCC) M25.511 (ICD-10-CM) - Pain in joint of right shoulder R32 (ICD-10-CM) - Urinary incontinence, unspecified type   THERAPY DIAG:  Muscle weakness (generalized)  Unspecified lack of coordination  Rationale for Evaluation and Treatment: Rehabilitation  ONSET DATE: chronic   SUBJECTIVE:                                                                                                                                                                                           SUBJECTIVE STATEMENT: Pt states that she is doing much better. She has started sipping water and not drinking water 3 hours before bed and she feels like she is not filling up her kotex. She is not having any leaking.  PAIN:  Are you having pain? Yes NPRS scale: 0/10 Pain location:  Lt hip pain  Pain type: discomfort, tight Pain description: intermittent   Aggravating factors: prolonged sitting Relieving factors: movement, lying down   PRECAUTIONS: None  RED FLAGS: None   WEIGHT BEARING RESTRICTIONS: No  FALLS:  Has patient fallen in last 6 months? No  LIVING ENVIRONMENT: Lives with: lives with their family Lives in: House/apartment  OCCUPATION: not discussed  PLOF: Independent  PATIENT GOALS: improve bladder control   PERTINENT HISTORY:  Breast cancer, small  bowel obstruction, anxiety, ADHD, hernia repair Lt x 2, spigelian hernia, triple arthrodesis Lt foot Sexual abuse: No  BOWEL MOVEMENT: Pain with bowel movement: No Type of bowel movement:Frequency generally every day or every  other day and Strain No Fully empty rectum: Yes: - Leakage: Yes: in the last 2 months feels like it is related to water - has trouble reaching Pads: Yes: see below Fiber supplement: No Uses water to cleanse after bowel movement  URINATION: Pain with urination: No Fully empty bladder: Yes: - Stream: Strong Urgency: Yes: will fill pad on the way to the bathroom in the morning Frequency: will get distracted when she is needle pointing or working on other things and suppresses urge too long; only gets up 1x/night, every 3 hours during the day, before she goes anywhere Leakage: Urge to void, Coughing, Sneezing, and any jerking motion like in the car Pads: Yes: pad 100% of the time and brief if she is going out  INTERCOURSE: Pain with intercourse: no pain with intimate activities   PREGNANCY: Vaginal deliveries 2 Tearing Yes: unsure  PROLAPSE: none  OBJECTIVE:  05/03/23: No formal pelvic floor exam this session Pt with significantly improved pressure management during sit<>stand using exhale as she rises Good coordination with pelvic floor/transversus abdominus activation in seated without other muscle recruitment  03/14/23: COGNITION: Overall cognitive status: Within functional limits for tasks assessed     SENSATION: Light touch: Appears intact Proprioception: Appears intact  FUNCTIONAL TESTS:  Breath holding with transitions BUE assist for sit<>stand  GAIT: Comments: mild compensated trendelenburg   POSTURE: rounded shoulders, forward head, increased lumbar lordosis, increased thoracic kyphosis, anterior pelvic tilt, and flexed trunk    PALPATION:   General  No tenderness                External Perineal Exam increased redness                              Internal Pelvic Floor no tenderness, WNL  Patient confirms identification and approves PT to assess internal pelvic floor and treatment Yes  PELVIC MMT:   MMT eval  Vaginal 1/5, 4 second endurance, 5 repeat contractions   (Blank rows = not tested)        TONE: low  PROLAPSE: Mild anterior vaginal wall laxity   TODAY'S TREATMENT:                                                                                                                              DATE:  05/12/23 Neuromuscular re-education: Seated hip adduction with core/PF 2 x 10 Seated hip abduction with core/PF 2 x 10 Seated hip march with core/PF 2 x 10 Seated shoulder flexion 2 x 10 5lb weight Therapeutic activities: HEP review  05/03/23 There act: pt education on bladder irritants, slower hydration during the day, pacing herself, stopping drinking water 3 hrs before bedtime.  Doing urge drill when she gets up in the morning on the way to the bathroom, going back to bed as needed.    04/26/23 Neuromuscular re-education: Seated hip adduction with core/PF 2 x 10 Seated  hip abduction with core/PF 2 x 10 Seated hip march with core/PF 2 x 10 Seated shoulder flexion 2 x 10 5lb weight    PATIENT EDUCATION:  Education details: See above Person educated: Patient Education method: Explanation, Demonstration, Tactile cues, Verbal cues, and Handouts Education comprehension: verbalized understanding  HOME EXERCISE PROGRAM: [id number]  ASSESSMENT:  CLINICAL IMPRESSION: Pt is doing incredibly well with no episodes of urinary incontinence. She feels like she is doing very well with HEP. Today, we reviewed previous exercise progression and updated HEP with  these this session. She will continue to benefit from skilled PT intervention in order to improve bladder/bowel control and begin functional strengthening program in order to improve quality of life.   OBJECTIVE IMPAIRMENTS: decreased activity tolerance,  decreased coordination, decreased endurance, decreased strength, increased fascial restrictions, increased muscle spasms, impaired tone, postural dysfunction, and pain.   ACTIVITY LIMITATIONS: continence  PARTICIPATION LIMITATIONS: community activity  PERSONAL FACTORS: 1 comorbidity: medical history  are also affecting patient's functional outcome.   REHAB POTENTIAL: Good  CLINICAL DECISION MAKING: Stable/uncomplicated  EVALUATION COMPLEXITY: Low   GOALS: Goals reviewed with patient? Yes  SHORT TERM GOALS: Target date: 04/11/23 - updated 04/26/23 - updated 05/03/23  Pt will be independent with HEP.   Baseline: Goal status:MET 05/03/23  2.  Pt will be independent with the knack, urge suppression technique, and double voiding in order to improve bladder habits and decrease urinary incontinence.   Baseline:  Goal status: MET 05/03/23  3.  Pt will be independent with use of squatty potty, relaxed toileting mechanics, and improved bowel movement techniques in order to increase ease of bowel movements and complete evacuation.   Baseline:  Goal status: MET 05/03/23  4.  Pt will be able to correctly perform diaphragmatic breathing and appropriate pressure management in order to prevent worsening vaginal wall laxity and improve pelvic floor A/ROM.   Baseline:  Goal status: MET 05/03/23   LONG TERM GOALS: Target date: 05/09/23 - updated 04/26/23 - updated 05/03/23  Pt will be independent with advanced HEP.   Baseline:  Goal status: IN PROGRESS  2.  Pt will demonstrate normal pelvic floor muscle tone and A/ROM, able to achieve 4/5 strength with contractions and 10 sec endurance, in order to provide appropriate lumbopelvic support in functional activities.   Baseline:  Goal status: IN PROGRESS  3.  Pt will be able to go 2-3 hours in between voids without urgency or incontinence in order to improve QOL and perform all functional activities with less difficulty.   Baseline:  Goal  status: IN PROGRESS  4.  Pt will report no episodes of urinary or fecal incontinence in order to improve confidence in community activities and personal hygiene.   Baseline:  Goal status: IN PROGRESS  5.  Pt will report no greater than 1/10 Lt hip pain.  Baseline:  Goal status: IN PROGRESS   PLAN:  PT FREQUENCY: 1-2x/week  PT DURATION: 6 months  PLANNED INTERVENTIONS: Therapeutic exercises, Therapeutic activity, Neuromuscular re-education, Balance training, Gait training, Patient/Family education, Self Care, Joint mobilization, Dry Needling, Biofeedback, and Manual therapy  PLAN FOR NEXT SESSION: Progress core training; how is bladder retraining/timing of fluids going? Morning incontinence?   Julio Alm, PT, DPT09/26/241:12 PM

## 2023-05-16 DIAGNOSIS — M25511 Pain in right shoulder: Secondary | ICD-10-CM | POA: Diagnosis not present

## 2023-05-17 DIAGNOSIS — F900 Attention-deficit hyperactivity disorder, predominantly inattentive type: Secondary | ICD-10-CM | POA: Diagnosis not present

## 2023-05-17 DIAGNOSIS — F419 Anxiety disorder, unspecified: Secondary | ICD-10-CM | POA: Diagnosis not present

## 2023-05-17 DIAGNOSIS — F330 Major depressive disorder, recurrent, mild: Secondary | ICD-10-CM | POA: Diagnosis not present

## 2023-05-18 ENCOUNTER — Ambulatory Visit: Payer: PPO | Attending: Hematology and Oncology

## 2023-05-18 DIAGNOSIS — R279 Unspecified lack of coordination: Secondary | ICD-10-CM | POA: Diagnosis not present

## 2023-05-18 DIAGNOSIS — M6281 Muscle weakness (generalized): Secondary | ICD-10-CM | POA: Diagnosis not present

## 2023-05-18 NOTE — Therapy (Signed)
OUTPATIENT PHYSICAL THERAPY FEMALE PELVIC EVALUATION   Patient Name: [name] [name] MRN: [id number] DOB:[birth year], 72 y.o., female Today's Date: 05/18/2023  END OF SESSION:  PT End of Session - 05/18/23 1405     Visit Number 6    Date for PT Re-Evaluation 06/28/23    Authorization Type HTA    Progress Note Due on Visit 10    PT Start Time 1401    PT Stop Time 1441    PT Time Calculation (min) 40 min    Activity Tolerance Patient tolerated treatment well    Behavior During Therapy WFL for tasks assessed/performed              Past Medical History:  Diagnosis Date   ADHD (attention deficit hyperactivity disorder)    Anemia    Anxiety    Arthritis    Depression    GERD (gastroesophageal reflux disease)    History of panic attacks    Hyperlipidemia    Hypertension    Hypothyroidism, postsurgical    Pre-diabetes    SBO (small bowel obstruction) (HCC) 01/15/2020   Sleep apnea    SUI (stress urinary incontinence, female)    Wears glasses    Past Surgical History:  Procedure Laterality Date   BREAST BIOPSY Right 10/07/2022   BREAST BIOPSY Right 10/07/2022   [place] RT BREAST BX W LOC DEV 1ST LESION IMG BX SPEC US GUIDE 10/07/2022 [facility]   BREAST BIOPSY  11/18/2022   MM RT RADIOACTIVE SEED LOC MAMMO GUIDE 11/18/2022 [facility]   BREAST LUMPECTOMY WITH RADIOACTIVE SEED AND SENTINEL LYMPH NODE BIOPSY Right 11/19/2022   Procedure: RIGHT BREAST LUMPECTOMY WITH RADIOACTIVE SEED;  Surgeon: [name], MD;  Location: [facility] OR;  Service: General;  Laterality: Right;   CATARACT EXTRACTION Bilateral    2023   D & C HYSTEROSCOPY W/ RESECTION ENDOMETRIAL POLYP  04/15/2009   D & C HYSTEROSCOPY W/ RESECTOSCOPE AND THERMACHOICE ENDOMETRIAL ABLATION  12/09/2010   EXCISION MELANOMA WITH SENTINEL LYMPH NODE BIOPSY Right 11/19/2022   Procedure: SENTINEL LYMPH NODE BIOPSY;  Surgeon: [name], MD;  Location: [facility] OR;  Service: General;  Laterality: Right;    LAPAROSCOPIC ASSISTED VENTRAL HERNIA REPAIR Left 01/16/2020   Procedure: LAPAROSCOPIC ASSISTED HERNIA REPAIR;  Surgeon: Berna Bue, MD;  Location: [facility] OR;  Service: General;  Laterality: Left;   PUBOVAGINAL SLING N/A 07/26/2016   Procedure: PUBO-VAGINAL SLING ALTIS SLING;  Surgeon: Jethro Bolus, MD;  Location: [facility];  Service: Urology;  Laterality: N/A;   REPAIR SPIGELIAN HERNIA     RESECTION GIANT CELL TUMOR RIGHT LONG FINGER  09/13/2008   REVERSE SHOULDER ARTHROPLASTY Right 07/16/2021   Procedure: REVERSE SHOULDER ARTHROPLASTY;  Surgeon: Jones Broom, MD;  Location: [facility];  Service: Orthopedics;  Laterality: Right;   ROTATOR CUFF REPAIR Bilateral    THYROIDECTOMY  1979    [facility]   non-malignant diseased thyroid   THYROIDECTOMY Left 03/17/2021   Procedure: COMPLETION THYROIDECTOMY, LEFT LOBE;  Surgeon: Darnell Level, MD;  Location: [facility];  Service: General;  Laterality: Left;   TOTAL KNEE ARTHROPLASTY Right 12/17/2019   Procedure: RIGHT TOTAL KNEE ARTHROPLASTY;  Surgeon: Gean Birchwood, MD;  Location: [facility];  Service: Orthopedics;  Laterality: Right;   TOTAL KNEE ARTHROPLASTY Left 03/03/2020   Procedure: LEFT TOTAL KNEE ARTHROPLASTY;  Surgeon: Gean Birchwood, MD;  Location: [facility];  Service: Orthopedics;  Laterality: Left;   TRIPLE ARTHRODESIS LEFT FOOT  04/20/2007   VENTRAL HERNIA REPAIR  2003  WRIST GANGLION EXCISION Right 1975   Patient Active Problem List   Diagnosis Date Noted   Genetic testing 11/01/2022   Family history of breast cancer 10/21/2022   Malignant neoplasm of lower-inner quadrant of right breast of female, estrogen receptor positive (HCC) 10/18/2022   Neoplasm of uncertain behavior of thyroid gland 03/15/2021   Left thyroid nodule 03/15/2021   S/P TKR (total knee replacement) using cement, left 03/03/2020   Degenerative arthritis of left knee 02/29/2020   SBO (small bowel obstruction) (HCC) 01/15/2020   S/P TKR (total knee  replacement), right 12/19/2019   Total knee replacement status, right 12/17/2019   Osteoarthritis of right knee 12/14/2019   Obesity, morbid (HCC) 12/16/2016   Strain of iliopsoas muscle, initial encounter 07/15/2016   Elevated cholesterol    Urinary incontinence    Endometrial polyp    Primary osteoarthritis of knees, bilateral 05/15/2009   DERANGEMENT OF ANTERIOR HORN OF LATERAL MENISCUS 05/15/2009   KNEE PAIN 05/15/2009    PCP: Wilfrid Lund, PA  REFERRING PROVIDER: Serena Croissant, MD  REFERRING DIAG: C50.311,Z17.0 (ICD-10-CM) - Malignant neoplasm of lower-inner quadrant of right breast of female, estrogen receptor positive (HCC) M25.511 (ICD-10-CM) - Pain in joint of right shoulder R32 (ICD-10-CM) - Urinary incontinence, unspecified type   THERAPY DIAG:  Muscle weakness (generalized)  Unspecified lack of coordination  Rationale for Evaluation and Treatment: Rehabilitation  ONSET DATE: chronic   SUBJECTIVE:                                                                                                                                                                                           SUBJECTIVE STATEMENT: Pt states that she has been working on exercises, but has not performed the ones with the band. She has had no leaks during the day, but has had just a couple of episodes when she has completely filled her pad either late at night or first thing in the morning when she has not used the bathroom in a long time. She feels like the one that happened last night before bed, she didn't get the urge. Pt has stopped drinking a lot of water and feels like she misses it.   PAIN:  Are you having pain? Yes NPRS scale: 0/10 Pain location:  Lt hip pain  Pain type: discomfort, tight Pain description: intermittent   Aggravating factors: prolonged sitting Relieving factors: movement, lying down   PRECAUTIONS: None  RED FLAGS: None   WEIGHT BEARING RESTRICTIONS: No  FALLS:   Has patient fallen in last 6 months? No  LIVING ENVIRONMENT: Lives with: lives with their family Lives in: House/apartment  OCCUPATION: not discussed  PLOF: Independent  PATIENT GOALS: improve bladder control   PERTINENT HISTORY:  Breast cancer, small bowel obstruction, anxiety, ADHD, hernia repair Lt x 2, spigelian hernia, triple arthrodesis Lt foot Sexual abuse: No  BOWEL MOVEMENT: Pain with bowel movement: No Type of bowel movement:Frequency generally every day or every other day and Strain No Fully empty rectum: Yes: - Leakage: Yes: in the last 2 months feels like it is related to water - has trouble reaching Pads: Yes: see below Fiber supplement: No Uses water to cleanse after bowel movement  URINATION: Pain with urination: No Fully empty bladder: Yes: - Stream: Strong Urgency: Yes: will fill pad on the way to the bathroom in the morning Frequency: will get distracted when she is needle pointing or working on other things and suppresses urge too long; only gets up 1x/night, every 3 hours during the day, before she goes anywhere Leakage: Urge to void, Coughing, Sneezing, and any jerking motion like in the car Pads: Yes: pad 100% of the time and brief if she is going out  INTERCOURSE: Pain with intercourse: no pain with intimate activities   PREGNANCY: Vaginal deliveries 2 Tearing Yes: unsure  PROLAPSE: none  OBJECTIVE:  05/03/23: No formal pelvic floor exam this session Pt with significantly improved pressure management during sit<>stand using exhale as she rises Good coordination with pelvic floor/transversus abdominus activation in seated without other muscle recruitment  03/14/23: COGNITION: Overall cognitive status: Within functional limits for tasks assessed     SENSATION: Light touch: Appears intact Proprioception: Appears intact  FUNCTIONAL TESTS:  Breath holding with transitions BUE assist for sit<>stand  GAIT: Comments: mild compensated  trendelenburg   POSTURE: rounded shoulders, forward head, increased lumbar lordosis, increased thoracic kyphosis, anterior pelvic tilt, and flexed trunk    PALPATION:   General  No tenderness                External Perineal Exam increased redness                             Internal Pelvic Floor no tenderness, WNL  Patient confirms identification and approves PT to assess internal pelvic floor and treatment Yes  PELVIC MMT:   MMT eval  Vaginal 1/5, 4 second endurance, 5 repeat contractions   (Blank rows = not tested)        TONE: low  PROLAPSE: Mild anterior vaginal wall laxity   TODAY'S TREATMENT:                                                                                                                              DATE:  05/18/23 Neuromuscular re-education: Seated hip adduction with core/PF 2 x 10 Seated hip abduction with core/PF 2 x 10 Seated hip march with core/PF 2 x 10 Exercises: Seated heel raises 3 x 10 (also using for urgency) Standing 3 way kick 10x each, bil  Therapeutic activities: Reviewed urge drill and how to use in the morning - do not want her to hold contraction Returning to drinking more water steadily throughout the day   05/12/23 Neuromuscular re-education: Seated hip adduction with core/PF 2 x 10 Seated hip abduction with core/PF 2 x 10 Seated hip march with core/PF 2 x 10 Seated shoulder flexion 2 x 10 5lb weight Therapeutic activities: HEP review  05/03/23 There act: pt education on bladder irritants, slower hydration during the day, pacing herself, stopping drinking water 3 hrs before bedtime.  Doing urge drill when she gets up in the morning on the way to the bathroom, going back to bed as needed.    04/26/23 Neuromuscular re-education: Seated hip adduction with core/PF 2 x 10 Seated hip abduction with core/PF 2 x 10 Seated hip march with core/PF 2 x 10 Seated shoulder flexion 2 x 10 5lb weight    PATIENT EDUCATION:   Education details: See above Person educated: Patient Education method: Explanation, Demonstration, Tactile cues, Verbal cues, and Handouts Education comprehension: verbalized understanding  HOME EXERCISE PROGRAM: [id number]  ASSESSMENT:  CLINICAL IMPRESSION: Pt is doing very well overall with very few leaks. She is having difficulty getting to HEP with her band, so we modified exercises to perform without band to increase compliance. She was able to perform seated heel raises and standing 3 way kick with good tolerance, but she did fatigue quickly with standing kicks. Believe that gently starting to work on more standing exercises will be very helpful to improve core strengthening. We discussed urge drill again for optimizing use and not holding a contraction on her way to the bathroom. She will continue to benefit from skilled PT intervention in order to improve bladder/bowel control and begin functional strengthening program in order to improve quality of life.   OBJECTIVE IMPAIRMENTS: decreased activity tolerance, decreased coordination, decreased endurance, decreased strength, increased fascial restrictions, increased muscle spasms, impaired tone, postural dysfunction, and pain.   ACTIVITY LIMITATIONS: continence  PARTICIPATION LIMITATIONS: community activity  PERSONAL FACTORS: 1 comorbidity: medical history  are also affecting patient's functional outcome.   REHAB POTENTIAL: Good  CLINICAL DECISION MAKING: Stable/uncomplicated  EVALUATION COMPLEXITY: Low   GOALS: Goals reviewed with patient? Yes  SHORT TERM GOALS: Target date: 04/11/23 - updated 04/26/23 - updated 05/03/23  Pt will be independent with HEP.   Baseline: Goal status:MET 05/03/23  2.  Pt will be independent with the knack, urge suppression technique, and double voiding in order to improve bladder habits and decrease urinary incontinence.   Baseline:  Goal status: MET 05/03/23  3.  Pt will be independent with  use of squatty potty, relaxed toileting mechanics, and improved bowel movement techniques in order to increase ease of bowel movements and complete evacuation.   Baseline:  Goal status: MET 05/03/23  4.  Pt will be able to correctly perform diaphragmatic breathing and appropriate pressure management in order to prevent worsening vaginal wall laxity and improve pelvic floor A/ROM.   Baseline:  Goal status: MET 05/03/23   LONG TERM GOALS: Target date: 05/09/23 - updated 04/26/23 - updated 05/03/23  Pt will be independent with advanced HEP.   Baseline:  Goal status: IN PROGRESS  2.  Pt will demonstrate normal pelvic floor muscle tone and A/ROM, able to achieve 4/5 strength with contractions and 10 sec endurance, in order to provide appropriate lumbopelvic support in functional activities.   Baseline:  Goal status: IN PROGRESS  3.  Pt will be able to go  2-3 hours in between voids without urgency or incontinence in order to improve QOL and perform all functional activities with less difficulty.   Baseline:  Goal status: IN PROGRESS  4.  Pt will report no episodes of urinary or fecal incontinence in order to improve confidence in community activities and personal hygiene.   Baseline:  Goal status: IN PROGRESS  5.  Pt will report no greater than 1/10 Lt hip pain.  Baseline:  Goal status: IN PROGRESS   PLAN:  PT FREQUENCY: 1-2x/week  PT DURATION: 6 months  PLANNED INTERVENTIONS: Therapeutic exercises, Therapeutic activity, Neuromuscular re-education, Balance training, Gait training, Patient/Family education, Self Care, Joint mobilization, Dry Needling, Biofeedback, and Manual therapy  PLAN FOR NEXT SESSION: Progress core training; how is bladder retraining/timing of fluids going? Morning incontinence?   Julio Alm, PT, DPT10/02/242:41 PM

## 2023-05-25 ENCOUNTER — Ambulatory Visit: Payer: PPO

## 2023-05-25 DIAGNOSIS — M6281 Muscle weakness (generalized): Secondary | ICD-10-CM | POA: Diagnosis not present

## 2023-05-25 DIAGNOSIS — R279 Unspecified lack of coordination: Secondary | ICD-10-CM

## 2023-05-25 NOTE — Therapy (Signed)
OUTPATIENT PHYSICAL THERAPY FEMALE PELVIC EVALUATION   Patient Name: [name] [name] MRN: [id number] DOB:09-25-1950, 72 y.o., female Today's Date: 05/25/2023  END OF SESSION:  PT End of Session - 05/25/23 1238     Visit Number 7    Date for PT Re-Evaluation 06/28/23    Authorization Type HTA    Progress Note Due on Visit 10    PT Start Time 1236    PT Stop Time 1314    PT Time Calculation (min) 38 min    Activity Tolerance Patient tolerated treatment well    Behavior During Therapy WFL for tasks assessed/performed               Past Medical History:  Diagnosis Date   ADHD (attention deficit hyperactivity disorder)    Anemia    Anxiety    Arthritis    Depression    GERD (gastroesophageal reflux disease)    History of panic attacks    Hyperlipidemia    Hypertension    Hypothyroidism, postsurgical    Pre-diabetes    SBO (small bowel obstruction) (HCC) 01/15/2020   Sleep apnea    SUI (stress urinary incontinence, female)    Wears glasses    Past Surgical History:  Procedure Laterality Date   BREAST BIOPSY Right 10/07/2022   BREAST BIOPSY Right 10/07/2022   [place] RT BREAST BX W LOC DEV 1ST LESION IMG BX SPEC US GUIDE 10/07/2022 [facility]   BREAST BIOPSY  11/18/2022   MM RT RADIOACTIVE SEED LOC MAMMO GUIDE 11/18/2022 [facility]   BREAST LUMPECTOMY WITH RADIOACTIVE SEED AND SENTINEL LYMPH NODE BIOPSY Right 11/19/2022   Procedure: RIGHT BREAST LUMPECTOMY WITH RADIOACTIVE SEED;  Surgeon: [name], MD;  Location: [facility] OR;  Service: General;  Laterality: Right;   CATARACT EXTRACTION Bilateral    2023   D & C HYSTEROSCOPY W/ RESECTION ENDOMETRIAL POLYP  04/15/2009   D & C HYSTEROSCOPY W/ RESECTOSCOPE AND THERMACHOICE ENDOMETRIAL ABLATION  12/09/2010   EXCISION MELANOMA WITH SENTINEL LYMPH NODE BIOPSY Right 11/19/2022   Procedure: SENTINEL LYMPH NODE BIOPSY;  Surgeon: [name], MD;  Location: [facility] OR;  Service: General;  Laterality: Right;    LAPAROSCOPIC ASSISTED VENTRAL HERNIA REPAIR Left 01/16/2020   Procedure: LAPAROSCOPIC ASSISTED HERNIA REPAIR;  Surgeon: Berna Bue, MD;  Location: [facility] OR;  Service: General;  Laterality: Left;   PUBOVAGINAL SLING N/A 07/26/2016   Procedure: PUBO-VAGINAL SLING ALTIS SLING;  Surgeon: Jethro Bolus, MD;  Location: [facility];  Service: Urology;  Laterality: N/A;   REPAIR SPIGELIAN HERNIA     RESECTION GIANT CELL TUMOR RIGHT LONG FINGER  09/13/2008   REVERSE SHOULDER ARTHROPLASTY Right 07/16/2021   Procedure: REVERSE SHOULDER ARTHROPLASTY;  Surgeon: Jones Broom, MD;  Location: [facility];  Service: Orthopedics;  Laterality: Right;   ROTATOR CUFF REPAIR Bilateral    THYROIDECTOMY  1979    [facility]   non-malignant diseased thyroid   THYROIDECTOMY Left 03/17/2021   Procedure: COMPLETION THYROIDECTOMY, LEFT LOBE;  Surgeon: Darnell Level, MD;  Location: [facility];  Service: General;  Laterality: Left;   TOTAL KNEE ARTHROPLASTY Right 12/17/2019   Procedure: RIGHT TOTAL KNEE ARTHROPLASTY;  Surgeon: Gean Birchwood, MD;  Location: [facility];  Service: Orthopedics;  Laterality: Right;   TOTAL KNEE ARTHROPLASTY Left 03/03/2020   Procedure: LEFT TOTAL KNEE ARTHROPLASTY;  Surgeon: Gean Birchwood, MD;  Location: [facility];  Service: Orthopedics;  Laterality: Left;   TRIPLE ARTHRODESIS LEFT FOOT  04/20/2007   VENTRAL HERNIA REPAIR  2003   WRIST GANGLION EXCISION Right 1975   Patient Active Problem List   Diagnosis Date Noted   Genetic testing 11/01/2022   Family history of breast cancer 10/21/2022   Malignant neoplasm of lower-inner quadrant of right breast of female, estrogen receptor positive (HCC) 10/18/2022   Neoplasm of uncertain behavior of thyroid gland 03/15/2021   Left thyroid nodule 03/15/2021   S/P TKR (total knee replacement) using cement, left 03/03/2020   Degenerative arthritis of left knee 02/29/2020   SBO (small bowel obstruction) (HCC) 01/15/2020   S/P TKR (total knee  replacement), right 12/19/2019   Total knee replacement status, right 12/17/2019   Osteoarthritis of right knee 12/14/2019   Obesity, morbid (HCC) 12/16/2016   Strain of iliopsoas muscle, initial encounter 07/15/2016   Elevated cholesterol    Urinary incontinence    Endometrial polyp    Primary osteoarthritis of knees, bilateral 05/15/2009   DERANGEMENT OF ANTERIOR HORN OF LATERAL MENISCUS 05/15/2009   KNEE PAIN 05/15/2009    PCP: Wilfrid Lund, PA  REFERRING PROVIDER: Serena Croissant, MD  REFERRING DIAG: C50.311,Z17.0 (ICD-10-CM) - Malignant neoplasm of lower-inner quadrant of right breast of female, estrogen receptor positive (HCC) M25.511 (ICD-10-CM) - Pain in joint of right shoulder R32 (ICD-10-CM) - Urinary incontinence, unspecified type   THERAPY DIAG:  Muscle weakness (generalized)  Unspecified lack of coordination  Rationale for Evaluation and Treatment: Rehabilitation  ONSET DATE: chronic   SUBJECTIVE:                                                                                                                                                                                           SUBJECTIVE STATEMENT: Pt denies any changes in bladder control from last week. She feels like she is doing great. She is making sure she is not drinking a lot of fluids before bed. She feels like pelvic floor contractions are coming much more easily now.   PAIN:  Are you having pain? Yes NPRS scale: 0/10 Pain location:  Lt hip pain  Pain type: discomfort, tight Pain description: intermittent   Aggravating factors: prolonged sitting Relieving factors: movement, lying down   PRECAUTIONS: None  RED FLAGS: None   WEIGHT BEARING RESTRICTIONS: No  FALLS:  Has patient fallen in last 6 months? No  LIVING ENVIRONMENT: Lives with: lives with their family Lives in: House/apartment  OCCUPATION: not discussed  PLOF: Independent  PATIENT GOALS: improve bladder control    PERTINENT HISTORY:  Breast cancer, small bowel obstruction, anxiety, ADHD, hernia repair Lt x 2, spigelian hernia, triple arthrodesis Lt foot Sexual abuse: No  BOWEL MOVEMENT: Pain with bowel movement: No  Type of bowel movement:Frequency generally every day or every other day and Strain No Fully empty rectum: Yes: - Leakage: Yes: in the last 2 months feels like it is related to water - has trouble reaching Pads: Yes: see below Fiber supplement: No Uses water to cleanse after bowel movement  URINATION: Pain with urination: No Fully empty bladder: Yes: - Stream: Strong Urgency: Yes: will fill pad on the way to the bathroom in the morning Frequency: will get distracted when she is needle pointing or working on other things and suppresses urge too long; only gets up 1x/night, every 3 hours during the day, before she goes anywhere Leakage: Urge to void, Coughing, Sneezing, and any jerking motion like in the car Pads: Yes: pad 100% of the time and brief if she is going out  INTERCOURSE: Pain with intercourse: no pain with intimate activities   PREGNANCY: Vaginal deliveries 2 Tearing Yes: unsure  PROLAPSE: none  OBJECTIVE:  05/03/23: No formal pelvic floor exam this session Pt with significantly improved pressure management during sit<>stand using exhale as she rises Good coordination with pelvic floor/transversus abdominus activation in seated without other muscle recruitment  03/14/23: COGNITION: Overall cognitive status: Within functional limits for tasks assessed     SENSATION: Light touch: Appears intact Proprioception: Appears intact  FUNCTIONAL TESTS:  Breath holding with transitions BUE assist for sit<>stand  GAIT: Comments: mild compensated trendelenburg   POSTURE: rounded shoulders, forward head, increased lumbar lordosis, increased thoracic kyphosis, anterior pelvic tilt, and flexed trunk    PALPATION:   General  No tenderness                External  Perineal Exam increased redness                             Internal Pelvic Floor no tenderness, WNL  Patient confirms identification and approves PT to assess internal pelvic floor and treatment Yes  PELVIC MMT:   MMT eval  Vaginal 1/5, 4 second endurance, 5 repeat contractions   (Blank rows = not tested)        TONE: low  PROLAPSE: Mild anterior vaginal wall laxity   TODAY'S TREATMENT:                                                                                                                              DATE:  05/25/23 Neuromuscular re-education: Pelvic floor contraction strengthening program review Seated hip adduction with core/PF 2 x 10 Seated hip abduction with core/PF 2 x 10 Seated hip march with core/PF 2 x 10  Therapeutic activities: Urge drill review  05/18/23 Neuromuscular re-education: Seated hip adduction with core/PF 2 x 10 Seated hip abduction with core/PF 2 x 10 Seated hip march with core/PF 2 x 10 Exercises: Seated heel raises 3 x 10 (also using for urgency) Standing 3 way kick 10x each, bil Therapeutic activities: Reviewed urge drill and  how to use in the morning - do not want her to hold contraction Returning to drinking more water steadily throughout the day   05/12/23 Neuromuscular re-education: Seated hip adduction with core/PF 2 x 10 Seated hip abduction with core/PF 2 x 10 Seated hip march with core/PF 2 x 10 Seated shoulder flexion 2 x 10 5lb weight Therapeutic activities: HEP review  PATIENT EDUCATION:  Education details: See above Person educated: Patient Education method: Explanation, Demonstration, Tactile cues, Verbal cues, and Handouts Education comprehension: verbalized understanding  HOME EXERCISE PROGRAM: [id number]  ASSESSMENT:  CLINICAL IMPRESSION: Pt doing very well. She states that Kotex is consistently dry even though she keeps waiting for it to be wet. She does not feel urgency that is strong any more and can  easily make it to the bathroom without flooding.She reports good comfort and independence with HEP/pelvic floor contractions. She has met/discharged all goals. Due to progress and great functional improvements in condition, she is prepared to D/C skilled PT intervention at this time. She was encouraged to call with any questions or concerns.   OBJECTIVE IMPAIRMENTS: decreased activity tolerance, decreased coordination, decreased endurance, decreased strength, increased fascial restrictions, increased muscle spasms, impaired tone, postural dysfunction, and pain.   ACTIVITY LIMITATIONS: continence  PARTICIPATION LIMITATIONS: community activity  PERSONAL FACTORS: 1 comorbidity: medical history  are also affecting patient's functional outcome.   REHAB POTENTIAL: Good  CLINICAL DECISION MAKING: Stable/uncomplicated  EVALUATION COMPLEXITY: Low   GOALS: Goals reviewed with patient? Yes  SHORT TERM GOALS: Target date: 04/11/23 - updated 04/26/23 - updated 05/03/23 - updated 05/24/23  Pt will be independent with HEP.   Baseline: Goal status:MET 05/03/23  2.  Pt will be independent with the knack, urge suppression technique, and double voiding in order to improve bladder habits and decrease urinary incontinence.   Baseline:  Goal status: MET 05/03/23  3.  Pt will be independent with use of squatty potty, relaxed toileting mechanics, and improved bowel movement techniques in order to increase ease of bowel movements and complete evacuation.   Baseline:  Goal status: MET 05/03/23  4.  Pt will be able to correctly perform diaphragmatic breathing and appropriate pressure management in order to prevent worsening vaginal wall laxity and improve pelvic floor A/ROM.   Baseline:  Goal status: MET 05/03/23   LONG TERM GOALS: Target date: 05/09/23 - updated 04/26/23 - updated 05/03/23 - updated 05/25/23  Pt will be independent with advanced HEP.   Baseline:  Goal status: MET 05/25/23  2.  Pt will  demonstrate normal pelvic floor muscle tone and A/ROM, able to achieve 4/5 strength with contractions and 10 sec endurance, in order to provide appropriate lumbopelvic support in functional activities.   Baseline:  Goal status: DISCHARGED  3.  Pt will be able to go 2-3 hours in between voids without urgency or incontinence in order to improve QOL and perform all functional activities with less difficulty.   Baseline:  Goal status: MET 05/25/23  4.  Pt will report no episodes of urinary or fecal incontinence in order to improve confidence in community activities and personal hygiene.   Baseline:  Goal status: MET 05/25/23  5.  Pt will report no greater than 1/10 Lt hip pain.  Baseline:  Goal status: DISCHARGED   PLAN:  PT FREQUENCY: DC  PT DURATION: DC  PLANNED INTERVENTIONS: DC  PLAN FOR NEXT SESSION: DC  PHYSICAL THERAPY DISCHARGE SUMMARY  Visits from Start of Care: 7  Current functional level related to goals /  functional outcomes: MET   Remaining deficits: See above   Education / Equipment: HEP   Patient agrees to discharge. Patient goals were met. Patient is being discharged due to being pleased with the current functional level.  Julio Alm, PT, DPT10/04/2411:40 PM

## 2023-05-31 DIAGNOSIS — R400 Somnolence: Secondary | ICD-10-CM | POA: Diagnosis not present

## 2023-05-31 DIAGNOSIS — G4733 Obstructive sleep apnea (adult) (pediatric): Secondary | ICD-10-CM | POA: Diagnosis not present

## 2023-06-13 ENCOUNTER — Ambulatory Visit: Payer: PPO | Attending: General Surgery

## 2023-06-13 VITALS — Wt 292.4 lb

## 2023-06-13 DIAGNOSIS — Z483 Aftercare following surgery for neoplasm: Secondary | ICD-10-CM | POA: Insufficient documentation

## 2023-06-13 NOTE — Therapy (Signed)
OUTPATIENT PHYSICAL THERAPY SOZO SCREENING NOTE   Patient Name: [name] [name] MRN: [id number] DOB:1950/09/21, 72 y.o., female Today's Date: 06/13/2023  PCP: Wilfrid Lund, [place] REFERRING PROVIDER: [name], MD   PT End of Session - 06/13/23 1559     Visit Number 7   # unchanged due to screeno only   PT Start Time 1556    PT Stop Time 1601    PT Time Calculation (min) 5 min    Activity Tolerance Patient tolerated treatment well    Behavior During Therapy WFL for tasks assessed/performed             Past Medical History:  Diagnosis Date   ADHD (attention deficit hyperactivity disorder)    Anemia    Anxiety    Arthritis    Depression    GERD (gastroesophageal reflux disease)    History of panic attacks    Hyperlipidemia    Hypertension    Hypothyroidism, postsurgical    Pre-diabetes    SBO (small bowel obstruction) (HCC) 01/15/2020   Sleep apnea    SUI (stress urinary incontinence, female)    Wears glasses    Past Surgical History:  Procedure Laterality Date   BREAST BIOPSY Right 10/07/2022   BREAST BIOPSY Right 10/07/2022   [place] RT BREAST BX W LOC DEV 1ST LESION IMG BX SPEC US GUIDE 10/07/2022 [facility]   BREAST BIOPSY  11/18/2022   MM RT RADIOACTIVE SEED LOC MAMMO GUIDE 11/18/2022 [facility]   BREAST LUMPECTOMY WITH RADIOACTIVE SEED AND SENTINEL LYMPH NODE BIOPSY Right 11/19/2022   Procedure: RIGHT BREAST LUMPECTOMY WITH RADIOACTIVE SEED;  Surgeon: [name], MD;  Location: [facility] OR;  Service: General;  Laterality: Right;   CATARACT EXTRACTION Bilateral    2023   D & C HYSTEROSCOPY W/ RESECTION ENDOMETRIAL POLYP  04/15/2009   D & C HYSTEROSCOPY W/ RESECTOSCOPE AND THERMACHOICE ENDOMETRIAL ABLATION  12/09/2010   EXCISION MELANOMA WITH SENTINEL LYMPH NODE BIOPSY Right 11/19/2022   Procedure: SENTINEL LYMPH NODE BIOPSY;  Surgeon: [name], MD;  Location: [facility] OR;  Service: General;  Laterality: Right;   LAPAROSCOPIC ASSISTED VENTRAL  HERNIA REPAIR Left 01/16/2020   Procedure: LAPAROSCOPIC ASSISTED HERNIA REPAIR;  Surgeon: Berna Bue, MD;  Location: [facility] OR;  Service: General;  Laterality: Left;   PUBOVAGINAL SLING N/A 07/26/2016   Procedure: PUBO-VAGINAL SLING ALTIS SLING;  Surgeon: Jethro Bolus, MD;  Location: [facility];  Service: Urology;  Laterality: N/A;   REPAIR SPIGELIAN HERNIA     RESECTION GIANT CELL TUMOR RIGHT LONG FINGER  09/13/2008   REVERSE SHOULDER ARTHROPLASTY Right 07/16/2021   Procedure: REVERSE SHOULDER ARTHROPLASTY;  Surgeon: Jones Broom, MD;  Location: [facility];  Service: Orthopedics;  Laterality: Right;   ROTATOR CUFF REPAIR Bilateral    THYROIDECTOMY  1979    [facility]   non-malignant diseased thyroid   THYROIDECTOMY Left 03/17/2021   Procedure: COMPLETION THYROIDECTOMY, LEFT LOBE;  Surgeon: Darnell Level, MD;  Location: [facility];  Service: General;  Laterality: Left;   TOTAL KNEE ARTHROPLASTY Right 12/17/2019   Procedure: RIGHT TOTAL KNEE ARTHROPLASTY;  Surgeon: Gean Birchwood, MD;  Location: [facility];  Service: Orthopedics;  Laterality: Right;   TOTAL KNEE ARTHROPLASTY Left 03/03/2020   Procedure: LEFT TOTAL KNEE ARTHROPLASTY;  Surgeon: Gean Birchwood, MD;  Location: [facility];  Service: Orthopedics;  Laterality: Left;   TRIPLE ARTHRODESIS LEFT FOOT  04/20/2007   VENTRAL HERNIA REPAIR  2003   WRIST GANGLION EXCISION Right 1975  Patient Active Problem List   Diagnosis Date Noted   Genetic testing 11/01/2022   Family history of breast cancer 10/21/2022   Malignant neoplasm of lower-inner quadrant of right breast of female, estrogen receptor positive (HCC) 10/18/2022   Neoplasm of uncertain behavior of thyroid gland 03/15/2021   Left thyroid nodule 03/15/2021   S/P TKR (total knee replacement) using cement, left 03/03/2020   Degenerative arthritis of left knee 02/29/2020   SBO (small bowel obstruction) (HCC) 01/15/2020   S/P TKR (total knee replacement), right 12/19/2019   Total  knee replacement status, right 12/17/2019   Osteoarthritis of right knee 12/14/2019   Obesity, morbid (HCC) 12/16/2016   Strain of iliopsoas muscle, initial encounter 07/15/2016   Elevated cholesterol    Urinary incontinence    Endometrial polyp    Primary osteoarthritis of knees, bilateral 05/15/2009   DERANGEMENT OF ANTERIOR HORN OF LATERAL MENISCUS 05/15/2009   KNEE PAIN 05/15/2009    REFERRING DIAG: right breast cancer at risk for lymphedema  THERAPY DIAG: Aftercare following surgery for neoplasm  PERTINENT HISTORY: Patient was diagnosed on 09/21/2022 with right grade 2 invasive ductal carcinoma breast cancer. She underwent a right lumpectomy and sentinel node biopsy on 11/19/2022 but there as not nodal tissue in the sentinel node biopsy. Final pathology was ER/PR positive and HER2 negative with a Ki67 of 2%. Hx left rotator cuff repair 08/05/20. Pins placed in left foot 2003. Hx of right shoulder replacement 07/16/2021.   PRECAUTIONS: right UE Lymphedema risk, None  SUBJECTIVE: Pt returns for her 3 month L-Dex screen.   PAIN:  Are you having pain? No  SOZO SCREENING: Patient was assessed today using the SOZO machine to determine the lymphedema index score. This was compared to her baseline score. It was determined that she is within the recommended range when compared to her baseline and no further action is needed at this time. She will continue SOZO screenings. These are done every 3 months for 2 years post operatively followed by every 6 months for 2 years, and then annually.   L-DEX FLOWSHEETS - 06/13/23 1600       L-DEX LYMPHEDEMA SCREENING   Measurement Type Unilateral    L-DEX MEASUREMENT EXTREMITY Upper Extremity    POSITION  Standing    DOMINANT SIDE Right    At Risk Side Right    BASELINE SCORE (UNILATERAL) 3.7    L-DEX SCORE (UNILATERAL) 3.2    VALUE CHANGE (UNILAT) -0.5               Hermenia Bers, PTA 06/13/2023, 4:01 PM

## 2023-06-29 DIAGNOSIS — H40013 Open angle with borderline findings, low risk, bilateral: Secondary | ICD-10-CM | POA: Diagnosis not present

## 2023-07-01 DIAGNOSIS — G4733 Obstructive sleep apnea (adult) (pediatric): Secondary | ICD-10-CM | POA: Diagnosis not present

## 2023-07-01 DIAGNOSIS — R400 Somnolence: Secondary | ICD-10-CM | POA: Diagnosis not present

## 2023-07-05 DIAGNOSIS — K219 Gastro-esophageal reflux disease without esophagitis: Secondary | ICD-10-CM | POA: Diagnosis not present

## 2023-07-05 DIAGNOSIS — E039 Hypothyroidism, unspecified: Secondary | ICD-10-CM | POA: Diagnosis not present

## 2023-07-05 DIAGNOSIS — G4700 Insomnia, unspecified: Secondary | ICD-10-CM | POA: Diagnosis not present

## 2023-07-05 DIAGNOSIS — F419 Anxiety disorder, unspecified: Secondary | ICD-10-CM | POA: Diagnosis not present

## 2023-07-05 DIAGNOSIS — E7800 Pure hypercholesterolemia, unspecified: Secondary | ICD-10-CM | POA: Diagnosis not present

## 2023-07-05 DIAGNOSIS — I10 Essential (primary) hypertension: Secondary | ICD-10-CM | POA: Diagnosis not present

## 2023-07-05 DIAGNOSIS — G4733 Obstructive sleep apnea (adult) (pediatric): Secondary | ICD-10-CM | POA: Diagnosis not present

## 2023-07-05 DIAGNOSIS — M8580 Other specified disorders of bone density and structure, unspecified site: Secondary | ICD-10-CM | POA: Diagnosis not present

## 2023-07-05 DIAGNOSIS — F331 Major depressive disorder, recurrent, moderate: Secondary | ICD-10-CM | POA: Diagnosis not present

## 2023-07-05 DIAGNOSIS — F900 Attention-deficit hyperactivity disorder, predominantly inattentive type: Secondary | ICD-10-CM | POA: Diagnosis not present

## 2023-07-05 DIAGNOSIS — R7303 Prediabetes: Secondary | ICD-10-CM | POA: Diagnosis not present

## 2023-07-27 DIAGNOSIS — L821 Other seborrheic keratosis: Secondary | ICD-10-CM | POA: Diagnosis not present

## 2023-07-27 DIAGNOSIS — L245 Irritant contact dermatitis due to other chemical products: Secondary | ICD-10-CM | POA: Diagnosis not present

## 2023-07-27 DIAGNOSIS — L820 Inflamed seborrheic keratosis: Secondary | ICD-10-CM | POA: Diagnosis not present

## 2023-07-27 DIAGNOSIS — D485 Neoplasm of uncertain behavior of skin: Secondary | ICD-10-CM | POA: Diagnosis not present

## 2023-07-31 DIAGNOSIS — G4733 Obstructive sleep apnea (adult) (pediatric): Secondary | ICD-10-CM | POA: Diagnosis not present

## 2023-07-31 DIAGNOSIS — R400 Somnolence: Secondary | ICD-10-CM | POA: Diagnosis not present

## 2023-08-02 DIAGNOSIS — F4310 Post-traumatic stress disorder, unspecified: Secondary | ICD-10-CM | POA: Diagnosis not present

## 2023-08-05 DIAGNOSIS — E039 Hypothyroidism, unspecified: Secondary | ICD-10-CM | POA: Diagnosis not present

## 2023-09-06 DIAGNOSIS — E039 Hypothyroidism, unspecified: Secondary | ICD-10-CM | POA: Diagnosis not present

## 2023-09-12 ENCOUNTER — Ambulatory Visit: Payer: PPO | Attending: General Surgery

## 2023-09-12 VITALS — Wt 290.4 lb

## 2023-09-12 DIAGNOSIS — Z483 Aftercare following surgery for neoplasm: Secondary | ICD-10-CM

## 2023-09-12 NOTE — Therapy (Signed)
OUTPATIENT PHYSICAL THERAPY SOZO SCREENING NOTE   Patient Name: [name] [name] MRN: [id number] DOB:06-29-1951, 73 y.o., female Today's Date: 09/12/2023  PCP: Wilfrid Lund, [place] REFERRING PROVIDER: [name], MD   PT End of Session - 09/12/23 1623     Visit Number 7   # unchanged due to screen only   PT Start Time 1622    PT Stop Time 1626    PT Time Calculation (min) 4 min    Activity Tolerance Patient tolerated treatment well    Behavior During Therapy WFL for tasks assessed/performed             Past Medical History:  Diagnosis Date   ADHD (attention deficit hyperactivity disorder)    Anemia    Anxiety    Arthritis    Depression    GERD (gastroesophageal reflux disease)    History of panic attacks    Hyperlipidemia    Hypertension    Hypothyroidism, postsurgical    Pre-diabetes    SBO (small bowel obstruction) (HCC) 01/15/2020   Sleep apnea    SUI (stress urinary incontinence, female)    Wears glasses    Past Surgical History:  Procedure Laterality Date   BREAST BIOPSY Right 10/07/2022   BREAST BIOPSY Right 10/07/2022   [place] RT BREAST BX W LOC DEV 1ST LESION IMG BX SPEC US GUIDE 10/07/2022 [facility]   BREAST BIOPSY  11/18/2022   MM RT RADIOACTIVE SEED LOC MAMMO GUIDE 11/18/2022 [facility]   BREAST LUMPECTOMY WITH RADIOACTIVE SEED AND SENTINEL LYMPH NODE BIOPSY Right 11/19/2022   Procedure: RIGHT BREAST LUMPECTOMY WITH RADIOACTIVE SEED;  Surgeon: [name], MD;  Location: [facility] OR;  Service: General;  Laterality: Right;   CATARACT EXTRACTION Bilateral    2023   D & C HYSTEROSCOPY W/ RESECTION ENDOMETRIAL POLYP  04/15/2009   D & C HYSTEROSCOPY W/ RESECTOSCOPE AND THERMACHOICE ENDOMETRIAL ABLATION  12/09/2010   EXCISION MELANOMA WITH SENTINEL LYMPH NODE BIOPSY Right 11/19/2022   Procedure: SENTINEL LYMPH NODE BIOPSY;  Surgeon: [name], MD;  Location: [facility] OR;  Service: General;  Laterality: Right;   LAPAROSCOPIC ASSISTED VENTRAL  HERNIA REPAIR Left 01/16/2020   Procedure: LAPAROSCOPIC ASSISTED HERNIA REPAIR;  Surgeon: Berna Bue, MD;  Location: [facility] OR;  Service: General;  Laterality: Left;   PUBOVAGINAL SLING N/A 07/26/2016   Procedure: PUBO-VAGINAL SLING ALTIS SLING;  Surgeon: Jethro Bolus, MD;  Location: [facility] [place];  Service: Urology;  Laterality: N/A;   REPAIR SPIGELIAN HERNIA     RESECTION GIANT CELL TUMOR RIGHT LONG FINGER  09/13/2008   REVERSE SHOULDER ARTHROPLASTY Right 07/16/2021   Procedure: REVERSE SHOULDER ARTHROPLASTY;  Surgeon: Jones Broom, MD;  Location: [facility];  Service: Orthopedics;  Laterality: Right;   ROTATOR CUFF REPAIR Bilateral    THYROIDECTOMY  1979    [facility]   non-malignant diseased thyroid   THYROIDECTOMY Left 03/17/2021   Procedure: COMPLETION THYROIDECTOMY, LEFT LOBE;  Surgeon: Darnell Level, MD;  Location: [facility];  Service: General;  Laterality: Left;   TOTAL KNEE ARTHROPLASTY Right 12/17/2019   Procedure: RIGHT TOTAL KNEE ARTHROPLASTY;  Surgeon: Gean Birchwood, MD;  Location: [facility];  Service: Orthopedics;  Laterality: Right;   TOTAL KNEE ARTHROPLASTY Left 03/03/2020   Procedure: LEFT TOTAL KNEE ARTHROPLASTY;  Surgeon: Gean Birchwood, MD;  Location: [facility];  Service: Orthopedics;  Laterality: Left;   TRIPLE ARTHRODESIS LEFT FOOT  04/20/2007   VENTRAL HERNIA REPAIR  2003   WRIST GANGLION EXCISION Right 1975  Patient Active Problem List   Diagnosis Date Noted   Genetic testing 11/01/2022   Family history of breast cancer 10/21/2022   Malignant neoplasm of lower-inner quadrant of right breast of female, estrogen receptor positive (HCC) 10/18/2022   Neoplasm of uncertain behavior of thyroid gland 03/15/2021   Left thyroid nodule 03/15/2021   S/P TKR (total knee replacement) using cement, left 03/03/2020   Degenerative arthritis of left knee 02/29/2020   SBO (small bowel obstruction) (HCC) 01/15/2020   S/P TKR (total knee replacement), right 12/19/2019   Total  knee replacement status, right 12/17/2019   Osteoarthritis of right knee 12/14/2019   Obesity, morbid (HCC) 12/16/2016   Strain of iliopsoas muscle, initial encounter 07/15/2016   Elevated cholesterol    Urinary incontinence    Endometrial polyp    Primary osteoarthritis of knees, bilateral 05/15/2009   DERANGEMENT OF ANTERIOR HORN OF LATERAL MENISCUS 05/15/2009   KNEE PAIN 05/15/2009    REFERRING DIAG: right breast cancer at risk for lymphedema  THERAPY DIAG: Aftercare following surgery for neoplasm  PERTINENT HISTORY: Patient was diagnosed on 09/21/2022 with right grade 2 invasive ductal carcinoma breast cancer. She underwent a right lumpectomy and sentinel node biopsy on 11/19/2022 but there as not nodal tissue in the sentinel node biopsy. Final pathology was ER/PR positive and HER2 negative with a Ki67 of 2%. Hx left rotator cuff repair 08/05/20. Pins placed in left foot 2003. Hx of right shoulder replacement 07/16/2021.   PRECAUTIONS: right UE Lymphedema risk, None  SUBJECTIVE: Pt returns for her 3 month L-Dex screen.   PAIN:  Are you having pain? No  SOZO SCREENING: Patient was assessed today using the SOZO machine to determine the lymphedema index score. This was compared to her baseline score. It was determined that she is within the recommended range when compared to her baseline and no further action is needed at this time. She will continue SOZO screenings. These are done every 3 months for 2 years post operatively followed by every 6 months for 2 years, and then annually.   L-DEX FLOWSHEETS - 09/12/23 1600       L-DEX LYMPHEDEMA SCREENING   Measurement Type Unilateral    L-DEX MEASUREMENT EXTREMITY Upper Extremity    POSITION  Standing    DOMINANT SIDE Right    At Risk Side Right    BASELINE SCORE (UNILATERAL) 3.7    L-DEX SCORE (UNILATERAL) 6.3    VALUE CHANGE (UNILAT) 2.6               Hermenia Bers, PTA 09/12/2023, 4:25 PM

## 2023-09-23 ENCOUNTER — Ambulatory Visit
Admission: RE | Admit: 2023-09-23 | Discharge: 2023-09-23 | Disposition: A | Discharge status: Home / Self Care | Payer: PPO | Source: Ambulatory Visit | Attending: Adult Health | Admitting: Adult Health

## 2023-09-23 DIAGNOSIS — C50311 Malignant neoplasm of lower-inner quadrant of right female breast: Secondary | ICD-10-CM

## 2023-10-12 ENCOUNTER — Other Ambulatory Visit (HOSPITAL_BASED_OUTPATIENT_CLINIC_OR_DEPARTMENT_OTHER): Payer: Self-pay

## 2023-10-12 MED ORDER — COMIRNATY 30 MCG/0.3ML IM SUSY
0.3000 mL | PREFILLED_SYRINGE | Freq: Once | INTRAMUSCULAR | 0 refills | Status: AC
  Filled 2023-10-12: qty 0.3, 1d supply, fill #0

## 2023-10-12 MED ORDER — FLUAD 0.5 ML IM SUSY
0.5000 mL | PREFILLED_SYRINGE | Freq: Once | INTRAMUSCULAR | 0 refills | Status: AC
  Filled 2023-10-12: qty 0.5, 1d supply, fill #0

## 2023-10-24 ENCOUNTER — Inpatient Hospital Stay: Payer: PPO | Attending: Hematology and Oncology | Admitting: Hematology and Oncology

## 2023-10-24 VITALS — BP 143/72 | HR 94 | Temp 98.3°F | Resp 18 | Ht 63.0 in | Wt 286.8 lb

## 2023-10-24 DIAGNOSIS — Z79811 Long term (current) use of aromatase inhibitors: Secondary | ICD-10-CM | POA: Insufficient documentation

## 2023-10-24 DIAGNOSIS — C50311 Malignant neoplasm of lower-inner quadrant of right female breast: Secondary | ICD-10-CM | POA: Diagnosis not present

## 2023-10-24 DIAGNOSIS — Z170 Estrogen receptor positive status [ER+]: Secondary | ICD-10-CM | POA: Insufficient documentation

## 2023-10-24 DIAGNOSIS — Z1732 Human epidermal growth factor receptor 2 negative status: Secondary | ICD-10-CM | POA: Insufficient documentation

## 2023-10-24 NOTE — Progress Notes (Signed)
 Patient Care Team: Wilfrid Lund, PA as PCP - General (Family Medicine) [name], MD as Consulting Physician (General Surgery) Serena Croissant, MD as Consulting Physician (Hematology and Oncology) Dorothy Puffer, MD as Consulting Physician (Radiation Oncology) Donnelly Angelica, RN as Oncology Nurse Navigator Pershing Proud, RN as Oncology Nurse Navigator  DIAGNOSIS:  Encounter Diagnosis  Name Primary?   Malignant neoplasm of lower-inner quadrant of right breast of female, estrogen receptor positive (HCC) Yes    SUMMARY OF ONCOLOGIC HISTORY: Oncology History  Malignant neoplasm of lower-inner quadrant of right breast of female, estrogen receptor positive (HCC)  10/07/2022 Initial Diagnosis   Screening mammogram detected right breast mass LIQ 0.8 cm, axilla negative, biopsy revealed grade 2 IDC ER 100%, PR 100%, Ki67 5%, HER2 3+ positive   10/18/2022 Cancer Staging   Staging form: Breast, AJCC 8th Edition - Clinical stage from 10/18/2022: Stage IA (cT1b, cN0, cM0, G2, ER+, PR+, HER2+) - Signed by Ronny Bacon, PA-C on 10/18/2022 Stage prefix: Initial diagnosis Method of lymph node assessment: Clinical Histologic grading system: 3 grade system    Genetic Testing   [employer] Common Cancer Panel+RNA was Negative. Of note, a variant of uncertain significance was detected in the AXIN2 gene (c.1481C>T) and the TSC2 gene (c.4794C>A). Report date is 10/28/2022.  The Common Hereditary Cancers Panel offered by [employer] includes sequencing and/or deletion duplication testing of the following 48 genes: APC, ATM, AXIN2, BAP1, BARD1, BMPR1A, BRCA1, BRCA2, BRIP1, CDH1, CDK4, CDKN2A (p14ARF and p16INK4a only), CHEK2, CTNNA1, DICER1, EPCAM (Deletion/duplication testing only), FH, GREM1 (promoter region duplication testing only), HOXB13, KIT, MBD4, MEN1, MLH1, MSH2, MSH3, MSH6, MUTYH, NF1, NHTL1, PALB2, PDGFRA, PMS2, POLD1, POLE, PTEN, RAD51C, RAD51D, SDHA (sequencing analysis only except exon  14), SDHB, SDHC, SDHD, SMAD4, SMARCA4. STK11, TP53, TSC1, TSC2, and VHL.   11/19/2022 Surgery   Right lumpectomy: Grade 1 IDC 1.9 cm with intermediate grade DCIS, margins negative, ALH, sentinel lymph node: Benign fibroconnective tissue no lymph node identified.  ER 100%, PR 100%, HER2 3+ positive, Ki-67 5%   12/16/2022 Cancer Staging   Staging form: Breast, AJCC 8th Edition - Pathologic stage from 12/16/2022: Stage IA (pT1c, pN0(sn), cM0, G1, ER+, PR+, HER2-, Oncotype DX score: 15) - Signed by Ronny Bacon, PA-C on 12/16/2022 Stage prefix: Initial diagnosis Method of lymph node assessment: Sentinel lymph node biopsy Multigene prognostic tests performed: Oncotype DX Recurrence score range: Greater than or equal to 11 Histologic grading system: 3 grade system   12/29/2022 - 01/27/2023 Radiation Therapy   Plan Name: Breast_R Site: Breast, Right Technique: 3D Mode: Photon Dose Per Fraction: 2.66 Gy Prescribed Dose (Delivered / Prescribed): 42.56 Gy / 42.56 Gy Prescribed Fxs (Delivered / Prescribed): 16 / 16   Plan Name: Breast_R_Bst Site: Breast, Right Technique: 3D Mode: Photon Dose Per Fraction: 2 Gy Prescribed Dose (Delivered / Prescribed): 8 Gy / 8 Gy Prescribed Fxs (Delivered / Prescribed): 4 / 4   01/2023 -  Anti-estrogen oral therapy   Anastrozole     CHIEF COMPLIANT: Follow-up on anastrozole therapy  HISTORY OF PRESENT ILLNESS:   History of Present Illness The patient, with a history of breast cancer and thyroid issues, presents with ongoing fatigue. The fatigue started last year and has been persistent despite management of thyroid issues. The patient reports that the fatigue is so severe that she often feels the need to take a nap during the day, which is unusual for her. The patient is currently on anastrozole for breast cancer and  her thyroid issues are being managed by Dr. Jeanice Lim.  The patient also mentions a desire for a breast reduction due to size differences  post-radiation. The patient reports that the breast that received radiation is smaller and higher than the other. The patient has expressed this desire humorously, but it seems to be a source of discomfort for her.     ALLERGIES:  is allergic to ceclor [cefaclor], bactrim [sulfamethoxazole-trimethoprim], and gabapentin.  MEDICATIONS:  Current Outpatient Medications  Medication Sig Dispense Refill   buPROPion (WELLBUTRIN XL) 150 MG 24 hr tablet Take 150 mg by mouth daily.     CALCIUM PO Take 4 tablets by mouth daily. Mini Calcium Total dose 1200 mg     Cholecalciferol (VITAMIN D3) 125 MCG (5000 UT) TABS Take 5,000 Units by mouth daily.     DULoxetine (CYMBALTA) 30 MG capsule Take 30 mg by mouth daily.     DULoxetine (CYMBALTA) 60 MG capsule Take 60 mg by mouth in the morning.  2   ferrous sulfate 325 (65 FE) MG tablet Take 325 mg by mouth.     irbesartan (AVAPRO) 75 MG tablet Take 75 mg by mouth in the morning.     lamoTRIgine (LAMICTAL) 25 MG tablet Take 25 mg by mouth 2 (two) times daily.     levothyroxine (SYNTHROID) 137 MCG tablet Take 137 mcg by mouth daily before breakfast.     lisdexamfetamine (VYVANSE) 50 MG capsule Take 50 mg by mouth in the morning.     Multiple Vitamin (MULTIVITAMIN) capsule Take 2 capsules by mouth daily.     MYRBETRIQ 50 MG TB24 tablet Take 50 mg by mouth in the morning.     nystatin cream (MYCOSTATIN) Apply 1/2" cream to base of left base of neck and in the fold below the breast until rash resolves 30 g 0   omeprazole (PRILOSEC) 40 MG capsule Take 20 mg by mouth daily.     simvastatin (ZOCOR) 40 MG tablet Take 40 mg by mouth in the morning.  2   vitamin k 100 MCG tablet Take 100 mcg by mouth daily.     ALPRAZolam (XANAX) 0.5 MG tablet Take 0.5 mg by mouth daily as needed for anxiety. (Patient not taking: Reported on 10/24/2023)     anastrozole (ARIMIDEX) 1 MG tablet Take 1 tablet (1 mg total) by mouth daily. (Patient not taking: Reported on 10/24/2023) 90 tablet  3   Zoster Vaccine Adjuvanted [facility]) injection  (Patient not taking: Reported on 10/24/2023)     No current facility-administered medications for this visit.    PHYSICAL EXAMINATION: ECOG PERFORMANCE STATUS: 1 - Symptomatic but completely ambulatory  Vitals:   10/24/23 1422  BP: (!) 143/72  Pulse: 94  Resp: 18  Temp: 98.3 F (36.8 C)  SpO2: 99%   Filed Weights   10/24/23 1422  Weight: 286 lb 12.8 oz (130.1 kg)      LABORATORY DATA:  I have reviewed the data as listed    Latest Ref Rng & Units 10/20/2022   12:18 PM 03/18/2021    4:14 AM 03/09/2021   11:55 AM  CMP  Glucose 70 - 99 mg/dL 92  295  92   BUN 8 - 23 mg/dL 17  16  15    Creatinine 0.44 - 1.00 mg/dL 2.84  1.32  4.40   Sodium 135 - 145 mmol/L 141  140  140   Potassium 3.5 - 5.1 mmol/L 4.0  4.1  4.3   Chloride 98 - 111  mmol/L 106  106  106   CO2 22 - 32 mmol/L 28  28  25    Calcium 8.9 - 10.3 mg/dL 9.0  8.0  9.5   Total Protein 6.5 - 8.1 g/dL 7.1     Total Bilirubin 0.3 - 1.2 mg/dL 0.4     Alkaline Phos 38 - 126 U/L 90     AST 15 - 41 U/L 15     ALT 0 - 44 U/L 14       Lab Results  Component Value Date   WBC 7.6 10/20/2022   HGB 12.5 10/20/2022   HCT 39.6 10/20/2022   MCV 86.7 10/20/2022   PLT 350 10/20/2022   NEUTROABS 4.5 10/20/2022    ASSESSMENT & PLAN:  Malignant neoplasm of lower-inner quadrant of right breast of female, estrogen receptor positive (HCC) 11/19/2022: Right lumpectomy: Grade 1 IDC 1.9 cm with intermediate grade DCIS, margins negative, ALH, sentinel lymph node: Benign fibroconnective tissue no lymph node identified.  ER 100%, PR 100%, HER2 negative, Ki-67 5%  Oncotype DX score 15 (risk of distant recurrence 4%)  01/27/2023: Completed radiation   Current treatment: Anastrozole 1 mg daily started 02/03/2023   She is going to a beach vacation starting tomorrow.   Anastrozole toxicities:  Breast cancer surveillance: Breast exam: Did not want [place] to do a breast exam Mammogram 09/23/2023:  Benign breast density category B Return to clinic in 1 year for follow-up ------------------------------------- Assessment and Plan Assessment & Plan Breast Cancer She has been on anastrozole for 8-9 months post-surgery with no issues. Continuing anastrozole reduces recurrence risk. - Continue anastrozole therapy. - Perform annual mammogram in February. - Schedule follow-up appointment annually in March.  Breast Asymmetry Post-Radiation She reports post-radiation breast asymmetry and desires cosmetic breast reduction, which is not medically necessary. Advised against unnecessary surgeries. - Consider consultation with a plastic surgeon if breast reduction is desired.  Thyroid Dysfunction She underwent total thyroidectomy due to nodules. Dr. Jeanice Lim manages her thyroid condition. - Continue management of thyroid condition with Dr. Jeanice Lim.  Fatigue She reports fatigue, possibly due to deconditioning and thyroid issues. Engaged with [facility] and Dr. Jeanice Lim for weight management. - Continue with [facility] for weight management. - Discuss potential weight management medications with Dr. Jeanice Lim. - Consider Weight Watchers program for semaglutide if insurance does not cover other options.  Follow-up Coordination She is scheduled to see Dr. Dwain Sarna this week but may adjust timing to avoid redundancy. - Coordinate with Dr. Dwain Sarna to adjust follow-up appointment to six months from now.      No orders of the defined types were placed in this encounter.  The patient has a good understanding of the overall plan. she agrees with it. she will call with any problems that may develop before the next visit here. Total time spent: 30 mins including face to face time and time spent for planning, charting and co-ordination of care   Tamsen Meek, MD 10/24/23

## 2023-10-24 NOTE — Assessment & Plan Note (Signed)
 11/19/2022: Right lumpectomy: Grade 1 IDC 1.9 cm with intermediate grade DCIS, margins negative, ALH, sentinel lymph node: Benign fibroconnective tissue no lymph node identified.  ER 100%, PR 100%, HER2 negative, Ki-67 5%  Oncotype DX score 15 (risk of distant recurrence 4%)  01/27/2023: Completed radiation   Current treatment: Anastrozole 1 mg daily started 02/03/2023   She is going to a beach vacation starting tomorrow.   Anastrozole toxicities:  Breast cancer surveillance: Breast exam 10/24/2023: Benign Mammogram 09/23/2023: Benign breast density category B Return to clinic in 1 year for follow-up

## 2023-10-26 DIAGNOSIS — E559 Vitamin D deficiency, unspecified: Secondary | ICD-10-CM | POA: Diagnosis not present

## 2023-10-26 DIAGNOSIS — R7303 Prediabetes: Secondary | ICD-10-CM | POA: Diagnosis not present

## 2023-10-26 DIAGNOSIS — M8580 Other specified disorders of bone density and structure, unspecified site: Secondary | ICD-10-CM | POA: Diagnosis not present

## 2023-10-26 DIAGNOSIS — G4733 Obstructive sleep apnea (adult) (pediatric): Secondary | ICD-10-CM | POA: Diagnosis not present

## 2023-10-26 DIAGNOSIS — I10 Essential (primary) hypertension: Secondary | ICD-10-CM | POA: Diagnosis not present

## 2023-10-26 DIAGNOSIS — Z6841 Body Mass Index (BMI) 40.0 and over, adult: Secondary | ICD-10-CM | POA: Diagnosis not present

## 2023-10-26 DIAGNOSIS — E65 Localized adiposity: Secondary | ICD-10-CM | POA: Diagnosis not present

## 2023-10-26 DIAGNOSIS — F331 Major depressive disorder, recurrent, moderate: Secondary | ICD-10-CM | POA: Diagnosis not present

## 2023-10-26 DIAGNOSIS — Z1331 Encounter for screening for depression: Secondary | ICD-10-CM | POA: Diagnosis not present

## 2023-10-26 DIAGNOSIS — E66813 Obesity, class 3: Secondary | ICD-10-CM | POA: Diagnosis not present

## 2023-10-26 DIAGNOSIS — R0602 Shortness of breath: Secondary | ICD-10-CM | POA: Diagnosis not present

## 2023-11-14 DIAGNOSIS — G4733 Obstructive sleep apnea (adult) (pediatric): Secondary | ICD-10-CM | POA: Diagnosis not present

## 2023-11-14 DIAGNOSIS — R400 Somnolence: Secondary | ICD-10-CM | POA: Diagnosis not present

## 2023-11-17 DIAGNOSIS — Z6841 Body Mass Index (BMI) 40.0 and over, adult: Secondary | ICD-10-CM | POA: Diagnosis not present

## 2023-11-17 DIAGNOSIS — E65 Localized adiposity: Secondary | ICD-10-CM | POA: Diagnosis not present

## 2023-11-17 DIAGNOSIS — R7303 Prediabetes: Secondary | ICD-10-CM | POA: Diagnosis not present

## 2023-11-17 DIAGNOSIS — E66813 Obesity, class 3: Secondary | ICD-10-CM | POA: Diagnosis not present

## 2023-11-17 DIAGNOSIS — E559 Vitamin D deficiency, unspecified: Secondary | ICD-10-CM | POA: Diagnosis not present

## 2023-11-17 DIAGNOSIS — G4733 Obstructive sleep apnea (adult) (pediatric): Secondary | ICD-10-CM | POA: Diagnosis not present

## 2023-11-17 DIAGNOSIS — I10 Essential (primary) hypertension: Secondary | ICD-10-CM | POA: Diagnosis not present

## 2023-11-17 DIAGNOSIS — M8580 Other specified disorders of bone density and structure, unspecified site: Secondary | ICD-10-CM | POA: Diagnosis not present

## 2023-11-22 DIAGNOSIS — F900 Attention-deficit hyperactivity disorder, predominantly inattentive type: Secondary | ICD-10-CM | POA: Diagnosis not present

## 2023-11-22 DIAGNOSIS — F330 Major depressive disorder, recurrent, mild: Secondary | ICD-10-CM | POA: Diagnosis not present

## 2023-11-22 DIAGNOSIS — F419 Anxiety disorder, unspecified: Secondary | ICD-10-CM | POA: Diagnosis not present

## 2023-11-23 DIAGNOSIS — Z7189 Other specified counseling: Secondary | ICD-10-CM | POA: Diagnosis not present

## 2023-11-23 DIAGNOSIS — E66813 Obesity, class 3: Secondary | ICD-10-CM | POA: Diagnosis not present

## 2023-11-23 DIAGNOSIS — Z6841 Body Mass Index (BMI) 40.0 and over, adult: Secondary | ICD-10-CM | POA: Diagnosis not present

## 2023-12-08 ENCOUNTER — Ambulatory Visit
Admission: RE | Admit: 2023-12-08 | Discharge: 2023-12-08 | Disposition: A | Discharge status: Home / Self Care | Payer: PPO | Source: Ambulatory Visit | Attending: Adult Health | Admitting: Adult Health

## 2023-12-08 DIAGNOSIS — N958 Other specified menopausal and perimenopausal disorders: Secondary | ICD-10-CM | POA: Diagnosis not present

## 2023-12-08 DIAGNOSIS — Z79811 Long term (current) use of aromatase inhibitors: Secondary | ICD-10-CM

## 2023-12-08 DIAGNOSIS — M8588 Other specified disorders of bone density and structure, other site: Secondary | ICD-10-CM | POA: Diagnosis not present

## 2023-12-12 ENCOUNTER — Ambulatory Visit: Payer: PPO | Attending: General Surgery

## 2023-12-12 ENCOUNTER — Telehealth: Payer: Self-pay | Admitting: *Deleted

## 2023-12-12 VITALS — Wt 281.0 lb

## 2023-12-12 DIAGNOSIS — Z483 Aftercare following surgery for neoplasm: Secondary | ICD-10-CM | POA: Insufficient documentation

## 2023-12-12 NOTE — Telephone Encounter (Signed)
Per Lindsey Causey, NP, called pt with message below. Pt verbalized understanding.  

## 2023-12-12 NOTE — Telephone Encounter (Signed)
-----   Message from Conway Dennis sent at 12/11/2023  7:37 PM EDT ----- Bone density shows osteopenia.  I recommend calcium , vitamin d, weight bearing exercises, and repeating bone density testing in 2 years time ----- Message ----- From: Interface, Rad Results In Sent: 12/08/2023   3:41 PM EDT To: Percival Brace, NP

## 2023-12-12 NOTE — Therapy (Signed)
 OUTPATIENT PHYSICAL THERAPY SOZO SCREENING NOTE   Patient Name: [name] [name] MRN: [id number] DOB:05-04-1951, 73 y.o., female Today's Date: 12/12/2023  PCP: Sinda Duel, [place] REFERRING PROVIDER: Enid Harry, MD   PT End of Session - 12/12/23 1615     Visit Number 7   # unchanged due to screen only   PT Start Time 1613    PT Stop Time 1617    PT Time Calculation (min) 4 min    Activity Tolerance Patient tolerated treatment well    Behavior During Therapy WFL for tasks assessed/performed             Past Medical History:  Diagnosis Date   ADHD (attention deficit hyperactivity disorder)    Anemia    Anxiety    Arthritis    Depression    GERD (gastroesophageal reflux disease)    History of panic attacks    Hyperlipidemia    Hypertension    Hypothyroidism, postsurgical    Pre-diabetes    SBO (small bowel obstruction) (HCC) 01/15/2020   Sleep apnea    SUI (stress urinary incontinence, female)    Wears glasses    Past Surgical History:  Procedure Laterality Date   BREAST BIOPSY Right 10/07/2022   BREAST BIOPSY Right 10/07/2022   US  RT BREAST BX W LOC DEV 1ST LESION IMG BX SPEC US  GUIDE 10/07/2022 [facility]   BREAST BIOPSY  11/18/2022   MM RT RADIOACTIVE SEED LOC MAMMO GUIDE 11/18/2022 [facility]   BREAST LUMPECTOMY WITH RADIOACTIVE SEED AND SENTINEL LYMPH NODE BIOPSY Right 11/19/2022   Procedure: RIGHT BREAST LUMPECTOMY WITH RADIOACTIVE SEED;  Surgeon: Enid Harry, MD;  Location: [facility] OR;  Service: General;  Laterality: Right;   CATARACT EXTRACTION Bilateral    2023   D & C HYSTEROSCOPY W/ RESECTION ENDOMETRIAL POLYP  04/15/2009   D & C HYSTEROSCOPY W/ RESECTOSCOPE AND THERMACHOICE ENDOMETRIAL ABLATION  12/09/2010   EXCISION MELANOMA WITH SENTINEL LYMPH NODE BIOPSY Right 11/19/2022   Procedure: SENTINEL LYMPH NODE BIOPSY;  Surgeon: Enid Harry, MD;  Location[facility] OR;  Service: General;  Laterality: Right;   LAPAROSCOPIC ASSISTED VENTRAL  HERNIA REPAIR Left 01/16/2020   Procedure: LAPAROSCOPIC ASSISTED HERNIA REPAIR;  Surgeon: Adalberto Acton, MD;  Location: [facility] OR;  Service: General;  Laterality: Left;   PUBOVAGINAL SLING N/A 07/26/2016   Procedure: PUBO-VAGINAL SLING ALTIS SLING;  Surgeon: Annamarie Kid, MD;  Location: [facility];  Service: Urology;  Laterality: N/A;   REPAIR SPIGELIAN HERNIA     RESECTION GIANT CELL TUMOR RIGHT LONG FINGER  09/13/2008   REVERSE SHOULDER ARTHROPLASTY Right 07/16/2021   Procedure: REVERSE SHOULDER ARTHROPLASTY;  Surgeon: Sammye Cristal, MD;  Location: [facility];  Service: Orthopedics;  Laterality: Right;   ROTATOR CUFF REPAIR Bilateral    THYROIDECTOMY  1979    [facility]   non-malignant diseased thyroid    THYROIDECTOMY Left 03/17/2021   Procedure: COMPLETION THYROIDECTOMY, LEFT LOBE;  Surgeon: Oralee Billow, MD;  Location: [facility];  Service: General;  Laterality: Left;   TOTAL KNEE ARTHROPLASTY Right 12/17/2019   Procedure: RIGHT TOTAL KNEE ARTHROPLASTY;  Surgeon: Wendolyn Hamburger, MD;  Location: [facility];  Service: Orthopedics;  Laterality: Right;   TOTAL KNEE ARTHROPLASTY Left 03/03/2020   Procedure: LEFT TOTAL KNEE ARTHROPLASTY;  Surgeon: Wendolyn Hamburger, MD;  Location: [facility];  Service: Orthopedics;  Laterality: Left;   TRIPLE ARTHRODESIS LEFT FOOT  04/20/2007   VENTRAL HERNIA REPAIR  2003   WRIST GANGLION EXCISION Right 1975  Patient Active Problem List   Diagnosis Date Noted   Genetic testing 11/01/2022   Family history of breast cancer 10/21/2022   Malignant neoplasm of lower-inner quadrant of right breast of female, estrogen receptor positive (HCC) 10/18/2022   Neoplasm of uncertain behavior of thyroid  gland 03/15/2021   Left thyroid  nodule 03/15/2021   S/P TKR (total knee replacement) using cement, left 03/03/2020   Degenerative arthritis of left knee 02/29/2020   SBO (small bowel obstruction) (HCC) 01/15/2020   S/P TKR (total knee replacement), right 12/19/2019   Total  knee replacement status, right 12/17/2019   Osteoarthritis of right knee 12/14/2019   Obesity, morbid (HCC) 12/16/2016   Strain of iliopsoas muscle, initial encounter 07/15/2016   Elevated cholesterol    Urinary incontinence    Endometrial polyp    Primary osteoarthritis of knees, bilateral 05/15/2009   DERANGEMENT OF ANTERIOR HORN OF LATERAL MENISCUS 05/15/2009   KNEE PAIN 05/15/2009    REFERRING DIAG: right breast cancer at risk for lymphedema  THERAPY DIAG: Aftercare following surgery for neoplasm  PERTINENT HISTORY: Patient was diagnosed on 09/21/2022 with right grade 2 invasive ductal carcinoma breast cancer. She underwent a right lumpectomy and sentinel node biopsy on 11/19/2022 but there as not nodal tissue in the sentinel node biopsy. Final pathology was ER/PR positive and HER2 negative with a Ki67 of 2%. Hx left rotator cuff repair 08/05/20. Pins placed in left foot 2003. Hx of right shoulder replacement 07/16/2021.   PRECAUTIONS: right UE Lymphedema risk, None  SUBJECTIVE: Pt returns for her 3 month L-Dex screen. "I was doing shoulder therapy last year here with Luella Sager but I had to stop because of radiation. I would like to resume though because my shoulder is still bothering me and now I feel like I have a knot in the muscle and it hurts when I twist my arm the wrong way."  PAIN:  Are you having pain? No, but I get shooting pains in my Rt upper arm when I twist my arm the wrong way.   SOZO SCREENING: Patient was assessed today using the SOZO machine to determine the lymphedema index score. This was compared to her baseline score. It was determined that she is within the recommended range when compared to her baseline and no further action is needed at this time. She will continue SOZO screenings. These are done every 3 months for 2 years post operatively followed by every 6 months for 2 years, and then annually.  Patient reported a change in status to PTA which initiated the  PTA consulting with a PT. PT determined it would be appropriate to initiate therapy at this time. PT requested a referral from patient's provider.    L-DEX FLOWSHEETS - 12/12/23 1600       L-DEX LYMPHEDEMA SCREENING   Measurement Type Unilateral    L-DEX MEASUREMENT EXTREMITY Upper Extremity    POSITION  Standing    DOMINANT SIDE Right    At Risk Side Right    BASELINE SCORE (UNILATERAL) 3.7    L-DEX SCORE (UNILATERAL) -1.5    VALUE CHANGE (UNILAT) -5.2            P: Cont every 3 month L-Dex screens. Eval Rt shoulder    Denyce Flank, PTA 12/12/2023, 4:17 PM

## 2023-12-13 ENCOUNTER — Other Ambulatory Visit: Payer: Self-pay | Admitting: *Deleted

## 2023-12-13 DIAGNOSIS — M25511 Pain in right shoulder: Secondary | ICD-10-CM

## 2023-12-20 DIAGNOSIS — Z0000 Encounter for general adult medical examination without abnormal findings: Secondary | ICD-10-CM | POA: Diagnosis not present

## 2023-12-20 DIAGNOSIS — Z23 Encounter for immunization: Secondary | ICD-10-CM | POA: Diagnosis not present

## 2023-12-20 DIAGNOSIS — G4733 Obstructive sleep apnea (adult) (pediatric): Secondary | ICD-10-CM | POA: Diagnosis not present

## 2023-12-20 DIAGNOSIS — E7800 Pure hypercholesterolemia, unspecified: Secondary | ICD-10-CM | POA: Diagnosis not present

## 2023-12-20 DIAGNOSIS — Z79899 Other long term (current) drug therapy: Secondary | ICD-10-CM | POA: Diagnosis not present

## 2023-12-20 DIAGNOSIS — I10 Essential (primary) hypertension: Secondary | ICD-10-CM | POA: Diagnosis not present

## 2023-12-20 DIAGNOSIS — R7303 Prediabetes: Secondary | ICD-10-CM | POA: Diagnosis not present

## 2023-12-20 DIAGNOSIS — R5383 Other fatigue: Secondary | ICD-10-CM | POA: Diagnosis not present

## 2023-12-20 DIAGNOSIS — E039 Hypothyroidism, unspecified: Secondary | ICD-10-CM | POA: Diagnosis not present

## 2023-12-22 DIAGNOSIS — G4733 Obstructive sleep apnea (adult) (pediatric): Secondary | ICD-10-CM | POA: Diagnosis not present

## 2023-12-22 DIAGNOSIS — I10 Essential (primary) hypertension: Secondary | ICD-10-CM | POA: Diagnosis not present

## 2023-12-22 DIAGNOSIS — E65 Localized adiposity: Secondary | ICD-10-CM | POA: Diagnosis not present

## 2023-12-22 DIAGNOSIS — E559 Vitamin D deficiency, unspecified: Secondary | ICD-10-CM | POA: Diagnosis not present

## 2023-12-22 DIAGNOSIS — E66813 Obesity, class 3: Secondary | ICD-10-CM | POA: Diagnosis not present

## 2023-12-22 DIAGNOSIS — Z6841 Body Mass Index (BMI) 40.0 and over, adult: Secondary | ICD-10-CM | POA: Diagnosis not present

## 2023-12-22 DIAGNOSIS — R7303 Prediabetes: Secondary | ICD-10-CM | POA: Diagnosis not present

## 2023-12-22 DIAGNOSIS — M8580 Other specified disorders of bone density and structure, unspecified site: Secondary | ICD-10-CM | POA: Diagnosis not present
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
## 2023-12-26 ENCOUNTER — Ambulatory Visit: Attending: General Surgery

## 2023-12-26 ENCOUNTER — Other Ambulatory Visit: Payer: Self-pay

## 2023-12-26 DIAGNOSIS — R293 Abnormal posture: Secondary | ICD-10-CM

## 2023-12-26 DIAGNOSIS — M25611 Stiffness of right shoulder, not elsewhere classified: Secondary | ICD-10-CM

## 2023-12-26 DIAGNOSIS — G8929 Other chronic pain: Secondary | ICD-10-CM | POA: Diagnosis not present

## 2023-12-26 DIAGNOSIS — M6281 Muscle weakness (generalized): Secondary | ICD-10-CM

## 2023-12-26 DIAGNOSIS — M25511 Pain in right shoulder: Secondary | ICD-10-CM | POA: Diagnosis not present

## 2023-12-26 NOTE — Patient Instructions (Signed)

## 2023-12-30 DIAGNOSIS — N3946 Mixed incontinence: Secondary | ICD-10-CM | POA: Diagnosis not present

## 2023-12-30 DIAGNOSIS — R350 Frequency of micturition: Secondary | ICD-10-CM | POA: Diagnosis not present

## 2024-01-03 ENCOUNTER — Ambulatory Visit

## 2024-01-03 DIAGNOSIS — R293 Abnormal posture: Secondary | ICD-10-CM

## 2024-01-03 DIAGNOSIS — M6281 Muscle weakness (generalized): Secondary | ICD-10-CM | POA: Diagnosis not present

## 2024-01-03 DIAGNOSIS — M25611 Stiffness of right shoulder, not elsewhere classified: Secondary | ICD-10-CM

## 2024-01-03 DIAGNOSIS — G8929 Other chronic pain: Secondary | ICD-10-CM

## 2024-01-03 NOTE — Therapy (Signed)
 OUTPATIENT PHYSICAL THERAPY TREATMENT   Patient Name: [name] [name] MRN: [id number] DOB:03/15/1951, 73 y.o., female Today's Date: 01/03/2024  END OF SESSION:  PT End of Session - 01/03/24 0848     Visit Number 2    Date for PT Re-Evaluation 02/20/24    Authorization Type [employer]    Progress Note Due on Visit 10    PT Start Time 0802    PT Stop Time 0848    PT Time Calculation (min) 46 min    Activity Tolerance Patient tolerated treatment well    Behavior During Therapy WFL for tasks assessed/performed                Past Medical History:  Diagnosis Date   ADHD (attention deficit hyperactivity disorder)    Anemia    Anxiety    Arthritis    Depression    GERD (gastroesophageal reflux disease)    History of panic attacks    Hyperlipidemia    Hypertension    Hypothyroidism, postsurgical    Pre-diabetes    SBO (small bowel obstruction) (HCC) 01/15/2020   Sleep apnea    SUI (stress urinary incontinence, female)    Wears glasses    Past Surgical History:  Procedure Laterality Date   BREAST BIOPSY Right 10/07/2022   BREAST BIOPSY Right 10/07/2022   US  RT BREAST BX W LOC DEV 1ST LESION IMG BX SPEC US  GUIDE 10/07/2022 [facility]   BREAST BIOPSY  11/18/2022   MM RT RADIOACTIVE SEED LOC MAMMO GUIDE 11/18/2022 [facility]   BREAST LUMPECTOMY WITH RADIOACTIVE SEED AND SENTINEL LYMPH NODE BIOPSY Right 11/19/2022   Procedure: RIGHT BREAST LUMPECTOMY WITH RADIOACTIVE SEED;  Surgeon: Enid Harry, MD;  Location: [facility] OR;  Service: General;  Laterality: Right;   CATARACT EXTRACTION Bilateral    2023   D & C HYSTEROSCOPY W/ RESECTION ENDOMETRIAL POLYP  04/15/2009   D & C HYSTEROSCOPY W/ RESECTOSCOPE AND THERMACHOICE ENDOMETRIAL ABLATION  12/09/2010   EXCISION MELANOMA WITH SENTINEL LYMPH NODE BIOPSY Right 11/19/2022   Procedure: SENTINEL LYMPH NODE BIOPSY;  Surgeon: Enid Harry, MD;  Location: [facility] OR;  Service: General;  Laterality: Right;    LAPAROSCOPIC ASSISTED VENTRAL HERNIA REPAIR Left 01/16/2020   Procedure: LAPAROSCOPIC ASSISTED HERNIA REPAIR;  Surgeon: Adalberto Acton, MD;  Location: [facility] OR;  Service: General;  Laterality: Left;   PUBOVAGINAL SLING N/A 07/26/2016   Procedure: PUBO-VAGINAL SLING ALTIS SLING;  Surgeon: Annamarie Kid, MD;  Location: [facility] [place];  Service: Urology;  Laterality: N/A;   REPAIR SPIGELIAN HERNIA     RESECTION GIANT CELL TUMOR RIGHT LONG FINGER  09/13/2008   REVERSE SHOULDER ARTHROPLASTY Right 07/16/2021   Procedure: REVERSE SHOULDER ARTHROPLASTY;  Surgeon: Sammye Cristal, MD;  Location: [facility];  Service: Orthopedics;  Laterality: Right;   ROTATOR CUFF REPAIR Bilateral    THYROIDECTOMY  1979    [facility]   non-malignant diseased thyroid    THYROIDECTOMY Left 03/17/2021   Procedure: COMPLETION THYROIDECTOMY, LEFT LOBE;  Surgeon: Oralee Billow, MD;  Location: [facility];  Service: General;  Laterality: Left;   TOTAL KNEE ARTHROPLASTY Right 12/17/2019   Procedure: RIGHT TOTAL KNEE ARTHROPLASTY;  Surgeon: Wendolyn Hamburger, MD;  Location: [facility];  Service: Orthopedics;  Laterality: Right;   TOTAL KNEE ARTHROPLASTY Left 03/03/2020   Procedure: LEFT TOTAL KNEE ARTHROPLASTY;  Surgeon: Wendolyn Hamburger, MD;  Location: [facility];  Service: Orthopedics;  Laterality: Left;   TRIPLE ARTHRODESIS LEFT FOOT  04/20/2007   VENTRAL HERNIA REPAIR  2003   WRIST GANGLION EXCISION Right 1975   Patient Active Problem List   Diagnosis Date Noted   Genetic testing 11/01/2022   Family history of breast cancer 10/21/2022   Malignant neoplasm of lower-inner quadrant of right breast of female, estrogen receptor positive (HCC) 10/18/2022   Neoplasm of uncertain behavior of thyroid  gland 03/15/2021   Left thyroid  nodule 03/15/2021   S/P TKR (total knee replacement) using cement, left 03/03/2020   Degenerative arthritis of left knee 02/29/2020   SBO (small bowel obstruction) (HCC) 01/15/2020   S/P TKR (total knee  replacement), right 12/19/2019   Total knee replacement status, right 12/17/2019   Osteoarthritis of right knee 12/14/2019   Obesity, morbid (HCC) 12/16/2016   Strain of iliopsoas muscle, initial encounter 07/15/2016   Elevated cholesterol    Urinary incontinence    Endometrial polyp    Primary osteoarthritis of knees, bilateral 05/15/2009   DERANGEMENT OF ANTERIOR HORN OF LATERAL MENISCUS 05/15/2009   KNEE PAIN 05/15/2009    REFERRING PROVIDER: Enid Harry, MD  REFERRING DIAG: Diagnosis  M25.511 (ICD-10-CM) - Pain in joint of right shoulder    THERAPY DIAG:  Muscle weakness (generalized)  Stiffness of right shoulder, not elsewhere classified  Chronic right shoulder pain  Abnormal posture  Rationale for Evaluation and Treatment: Rehabilitation  ONSET DATE: 11/19/2022  SUBJECTIVE:                                                                                                                                                                                           SUBJECTIVE STATEMENT: I can't find my pulleys.  The softball sized spot on my Rt shoulder is better after dry needling last session.     PERTINENT HISTORY:  Patient was diagnosed on 09/21/2022 with right grade 2 invasive ductal carcinoma breast cancer. She underwent a right lumpectomy and sentinel node biopsy on 11/19/2022 but there as not nodal tissue in the sentinel node biopsy. Final pathology was ER/PR positive and HER2 negative with a Ki67 of 2%. Hx left rotator cuff repair 08/05/20. Pins placed in left foot 2003. Hx of right shoulder replacement 07/16/2021.   PATIENT GOALS:  improve Rt shoulder function  PAIN: 01/03/24 Are you having pain? No and Yes: NPRS scale: 0-8/10 (brief 8/10 pain with certain movements) Pain location: Rt lateral arm above the elbow Pain description: intense, brief Aggravating factors: doing crafts (makes angels), pushing up off sofa, reaching out  Relieving factors: stopping the  aggravating activity, no using it  PRECAUTIONS: Recent Surgery, right UE Lymphedema risk, Other: previous right shoulder replacement 07/16/2021  ACTIVITY LEVEL / LEISURE: deep water  aerobics (2x/wk), crafts (  makes angels)   OBJECTIVE:   PATIENT SURVEYS:  12/26/23: QUICK DASH:52.3%    POSTURE:  Forward head and rounded shoulders posture   UPPER EXTREMITY AROM/PROM:   A/PROM RIGHT   eval    Shoulder extension 41  Shoulder flexion 85  Shoulder abduction 70  Shoulder internal rotation T12 with walking up back  Shoulder external rotation To Rt lateral occiput                          (Blank rows = not tested)   Lt is WFLs CERVICAL AROM:     Percent limited 12/26/23  Flexion WNL  Extension 25% limited  Right lateral flexion 50% limited  Left lateral flexion 50% limited  Right rotation 25% limited   Left rotation 25 % limited      UPPER EXTREMITY STRENGTH:  Tested in neutral: 4+/5 Rt shoulder, against gravity: flexion 3-/5 with limited ROM, abduction 3/5, IR/ER 4/5     TREATMENT:   Date: 01/03/24:  Overhead pulleys: flexion x 3 min, abduction x2 min  UE ranger seated: flexion, circles bil x10 each  Flexion and extension: Rt isometrics 5" x10 each ER with yellow band 2x10  Trigger Point Dry Needling  Subsequent Treatment: Instructions reviewed, if requested by the patient, prior to subsequent dry needling treatment.   Patient Verbal Consent Given: Yes Education Handout Provided: Previously Provided Muscles Treated: Rt lateral deltoid and biceps  Electrical Stimulation Performed: No Treatment Response/Outcome: Utilized skilled palpation to identify bony landmarks and trigger points.  Able to illicit twitch response and muscle elongation.  Soft tissue mobilization to muscles needled following DN to further promote tissue elongation and decreased pain.       Date: 12/26/23:  Findings from evaluation discussed, pt educated on plan of care, HEP initiated.   Trigger  Point Dry Needling  Initial Treatment: Pt instructed on Dry Needling rational, procedures, and possible side effects. Pt instructed to expect mild to moderate muscle soreness later in the day and/or into the next day.  Pt instructed in methods to reduce muscle soreness. Pt instructed to continue prescribed HEP. Patient was educated on signs and symptoms of infection and other risk factors and advised to seek medical attention should they occur.  Patient verbalized understanding of these instructions and education.   Patient Verbal Consent Given: Yes Education Handout Provided: Yes Muscles Treated: Rt lateral deltoid and biecps  Electrical Stimulation Performed: No Treatment Response/Outcome: Utilized skilled palpation to identify bony landmarks and trigger points.  Able to illicit twitch response and muscle elongation.    PATIENT EDUCATION:  Education details: Access Code: [email or web address] Person educated: Patient Education method: Explanation, Demonstration, and Verbal cues Education comprehension: verbalized understanding and returned demonstration  HOME EXERCISE PROGRAM: Access Code: [facility] URL: [email or web address] Date: 01/03/2024 Prepared by: Loetta Ringer  Exercises - Seated Shoulder Flexion Towel Slide at Table Top  - 3 x daily - 7 x weekly - 1 sets - 10 reps - 10-20 hold - Seated Shoulder Abduction Towel Slide at Table Top  - 3 x daily - 7 x weekly - 1 sets - 10 reps - 10-20 hold - Seated Neck Sidebending Stretch  - 3 x daily - 7 x weekly - 1 sets - 3 reps - 30 hold - Standing Shoulder External Rotation with Resistance  - 2 x daily - 7 x weekly - 2 sets - 10 reps - Standing Isometric Shoulder Extension with Doorway - Arm Bent  - 2 x  daily - 7 x weekly - 1-2 sets - 10 reps - 5 hold - Standing Isometric Shoulder Flexion with Doorway - Arm Bent  - 2 x daily - 7 x weekly - 1-2 sets - 10 reps   ASSESSMENT: ASSESSMENT:  CLINICAL IMPRESSION: Good response to dry needling  last session with reduced muscle tension after session.  Session focused on ROM and gentle strength.  Painful arc with abduction and clockwise circles with UE Ranger.   Good response to dry needling with improved tissue mobility and twitch response in Rt biceps and deltoids today. Patient will benefit from skilled PT to address the below impairments and improve overall function.    OBJECTIVE IMPAIRMENTS: decreased mobility, decreased strength, increased muscle spasms, and pain.   ACTIVITY LIMITATIONS: caring for others, reaching  PARTICIPATION LIMITATIONS: cleaning and laundry  PERSONAL FACTORS: Social background and Time since onset of injury/illness/exacerbation, Rt total shoulder replacement and radiation to region are also affecting patient's functional outcome.   REHAB POTENTIAL: Good  CLINICAL DECISION MAKING: Stable/uncomplicated  EVALUATION COMPLEXITY: Low  GOALS: Goals reviewed with patient? Yes  SHORT TERM GOALS: Target date: 01/23/2024     Be independent in initial HEP Baseline: Goal status: in progress   2.  Report > or = to 30% reduction in Rt shoulder/arm pain with functional reaching tasks  Baseline:  Goal status: NEW  3.  Verbalize and demonstrate postural corrections to reduce scapular muscle tension  Baseline:  Goal status: NEW   LONG TERM GOALS: Target date:02/20/2024  Be independent in advanced HEP Baseline:  Goal status: NEW  2.  Improve QuickDASH to < or = to 40%  Baseline: 52.3% Goal status: NEW  3.  Report > or = to 60% reduction in Rt shoulder pain with reaching tasks  Baseline:  Goal status: NEW  4.  Demonstrate Rt shoulder A/ROM flexion to 95 degrees to improve reaching overhead due to improved functional strength  Baseline: 85 Goal status: NEW   PLAN: PT FREQUENCY: 1-2x/week  PT DURATION: 8 weeks  PLANNED INTERVENTIONS: Therapeutic exercises, Therapeutic activity, Neuromuscular re-education, Balance training, Gait training,  Patient/Family education, Self Care, Joint mobilization, Stair training, Vestibular training, Canalith repositioning, Aquatic Therapy, Dry Needling, Electrical stimulation, Cryotherapy, Moist heat, Taping, Traction, Ultrasound, Ionotophoresis 4mg /ml Dexamethasone , Manual therapy, and Re-evaluation  PLAN FOR NEXT SESSION: review new HEP, pulleys, postural strength, work on A/AROM to improve strength against gravity, assess response to dry needling and repeat if helpful  Luella Sager, PT 12/19/23 10:18 AM

## 2024-01-10 ENCOUNTER — Ambulatory Visit

## 2024-01-10 DIAGNOSIS — M6281 Muscle weakness (generalized): Secondary | ICD-10-CM | POA: Diagnosis not present

## 2024-01-10 DIAGNOSIS — M25611 Stiffness of right shoulder, not elsewhere classified: Secondary | ICD-10-CM

## 2024-01-10 DIAGNOSIS — R293 Abnormal posture: Secondary | ICD-10-CM

## 2024-01-10 DIAGNOSIS — G8929 Other chronic pain: Secondary | ICD-10-CM

## 2024-01-10 NOTE — Therapy (Signed)
 OUTPATIENT PHYSICAL THERAPY TREATMENT   Patient Name: [name] [name] MRN: [id number] DOB:[birth year], 73 y.o., female Today's Date: 01/10/2024  END OF SESSION:  PT End of Session - 01/10/24 0850     Visit Number 3    Date for PT Re-Evaluation 02/20/24    Authorization Type [employer]    Progress Note Due on Visit 10    PT Start Time 0807    PT Stop Time 0850    PT Time Calculation (min) 43 min    Activity Tolerance Patient tolerated treatment well    Behavior During Therapy WFL for tasks assessed/performed                 Past Medical History:  Diagnosis Date   ADHD (attention deficit hyperactivity disorder)    Anemia    Anxiety    Arthritis    Depression    GERD (gastroesophageal reflux disease)    History of panic attacks    Hyperlipidemia    Hypertension    Hypothyroidism, postsurgical    Pre-diabetes    SBO (small bowel obstruction) (HCC) 01/15/2020   Sleep apnea    SUI (stress urinary incontinence, female)    Wears glasses    Past Surgical History:  Procedure Laterality Date   BREAST BIOPSY Right 10/07/2022   BREAST BIOPSY Right 10/07/2022   US  RT BREAST BX W LOC DEV 1ST LESION IMG BX SPEC US  GUIDE 10/07/2022 [facility]   BREAST BIOPSY  11/18/2022   MM RT RADIOACTIVE SEED LOC MAMMO GUIDE 11/18/2022 [facility]   BREAST LUMPECTOMY WITH RADIOACTIVE SEED AND SENTINEL LYMPH NODE BIOPSY Right 11/19/2022   Procedure: RIGHT BREAST LUMPECTOMY WITH RADIOACTIVE SEED;  Surgeon: Enid Harry, MD;  Location: [facility] OR;  Service: General;  Laterality: Right;   CATARACT EXTRACTION Bilateral    2023   D & C HYSTEROSCOPY W/ RESECTION ENDOMETRIAL POLYP  04/15/2009   D & C HYSTEROSCOPY W/ RESECTOSCOPE AND THERMACHOICE ENDOMETRIAL ABLATION  12/09/2010   EXCISION MELANOMA WITH SENTINEL LYMPH NODE BIOPSY Right 11/19/2022   Procedure: SENTINEL LYMPH NODE BIOPSY;  Surgeon: Enid Harry, MD;  Location: [facility] OR;  Service: General;  Laterality: Right;    LAPAROSCOPIC ASSISTED VENTRAL HERNIA REPAIR Left 01/16/2020   Procedure: LAPAROSCOPIC ASSISTED HERNIA REPAIR;  Surgeon: Adalberto Acton, MD;  Location: [facility] OR;  Service: General;  Laterality: Left;   PUBOVAGINAL SLING N/A 07/26/2016   Procedure: PUBO-VAGINAL SLING ALTIS SLING;  Surgeon: Annamarie Kid, MD;  Location: [facility];  Service: Urology;  Laterality: N/A;   REPAIR SPIGELIAN HERNIA     RESECTION GIANT CELL TUMOR RIGHT LONG FINGER  09/13/2008   REVERSE SHOULDER ARTHROPLASTY Right 07/16/2021   Procedure: REVERSE SHOULDER ARTHROPLASTY;  Surgeon: Sammye Cristal, MD;  Location: [facility];  Service: Orthopedics;  Laterality: Right;   ROTATOR CUFF REPAIR Bilateral    THYROIDECTOMY  1979    [facility]   non-malignant diseased thyroid    THYROIDECTOMY Left 03/17/2021   Procedure: COMPLETION THYROIDECTOMY, LEFT LOBE;  Surgeon: Oralee Billow, MD;  Location: [facility];  Service: General;  Laterality: Left;   TOTAL KNEE ARTHROPLASTY Right 12/17/2019   Procedure: RIGHT TOTAL KNEE ARTHROPLASTY;  Surgeon: Wendolyn Hamburger, MD;  Location: [facility];  Service: Orthopedics;  Laterality: Right;   TOTAL KNEE ARTHROPLASTY Left 03/03/2020   Procedure: LEFT TOTAL KNEE ARTHROPLASTY;  Surgeon: Wendolyn Hamburger, MD;  Location: [facility];  Service: Orthopedics;  Laterality: Left;   TRIPLE ARTHRODESIS LEFT FOOT  04/20/2007   VENTRAL HERNIA REPAIR  2003   WRIST GANGLION EXCISION Right 1975   Patient Active Problem List   Diagnosis Date Noted   Genetic testing 11/01/2022   Family history of breast cancer 10/21/2022   Malignant neoplasm of lower-inner quadrant of right breast of female, estrogen receptor positive (HCC) 10/18/2022   Neoplasm of uncertain behavior of thyroid  gland 03/15/2021   Left thyroid  nodule 03/15/2021   S/P TKR (total knee replacement) using cement, left 03/03/2020   Degenerative arthritis of left knee 02/29/2020   SBO (small bowel obstruction) (HCC) 01/15/2020   S/P TKR (total knee  replacement), right 12/19/2019   Total knee replacement status, right 12/17/2019   Osteoarthritis of right knee 12/14/2019   Obesity, morbid (HCC) 12/16/2016   Strain of iliopsoas muscle, initial encounter 07/15/2016   Elevated cholesterol    Urinary incontinence    Endometrial polyp    Primary osteoarthritis of knees, bilateral 05/15/2009   DERANGEMENT OF ANTERIOR HORN OF LATERAL MENISCUS 05/15/2009   KNEE PAIN 05/15/2009    REFERRING PROVIDER: Enid Harry, MD  REFERRING DIAG: Diagnosis  M25.511 (ICD-10-CM) - Pain in joint of right shoulder    THERAPY DIAG:  Muscle weakness (generalized)  Stiffness of right shoulder, not elsewhere classified  Chronic right shoulder pain  Abnormal posture  Rationale for Evaluation and Treatment: Rehabilitation  ONSET DATE: 11/19/2022  SUBJECTIVE:                                                                                                                                                                                           SUBJECTIVE STATEMENT: I didn't do much for my shoulder over the weekend because I traveled for a wedding.  My Rt shoulder is feeling better-40% improvement    PERTINENT HISTORY:  Patient was diagnosed on 09/21/2022 with right grade 2 invasive ductal carcinoma breast cancer. She underwent a right lumpectomy and sentinel node biopsy on 11/19/2022 but there as not nodal tissue in the sentinel node biopsy. Final pathology was ER/PR positive and HER2 negative with a Ki67 of 2%. Hx left rotator cuff repair 08/05/20. Pins placed in left foot 2003. Hx of right shoulder replacement 07/16/2021.   PATIENT GOALS:  improve Rt shoulder function  PAIN: 01/10/24 Are you having pain? No and Yes: NPRS scale: 0-4/10 (brief 4/10 pain with certain movements) Pain location: Rt lateral arm above the elbow Pain description: intense, brief Aggravating factors: doing crafts (makes angels), pushing up off sofa, reaching out  Relieving  factors: stopping the aggravating activity, no using it  PRECAUTIONS: Recent Surgery, right UE Lymphedema risk, Other: previous right shoulder replacement 07/16/2021  ACTIVITY LEVEL / LEISURE: deep water  aerobics (  2x/wk), crafts (makes angels)   OBJECTIVE:   PATIENT SURVEYS:  12/26/23: QUICK DASH:52.3%    POSTURE:  Forward head and rounded shoulders posture   UPPER EXTREMITY AROM/PROM:   A/PROM RIGHT   eval    Shoulder extension 41  Shoulder flexion 85  Shoulder abduction 70  Shoulder internal rotation T12 with walking up back  Shoulder external rotation To Rt lateral occiput                          (Blank rows = not tested)   Lt is WFLs CERVICAL AROM:     Percent limited 12/26/23  Flexion WNL  Extension 25% limited  Right lateral flexion 50% limited  Left lateral flexion 50% limited  Right rotation 25% limited   Left rotation 25 % limited      UPPER EXTREMITY STRENGTH:  Tested in neutral: 4+/5 Rt shoulder, against gravity: flexion 3-/5 with limited ROM, abduction 3/5, IR/ER 4/5     TREATMENT:   Date: 01/10/24:  Overhead pulleys: flexion x 3 min, abduction x2 min  UE ranger seated: flexion, circles bil x10 each  UE ranger on wall: flexion x10 Standing shoulder extension and flexion with yellow band x10 Flexion and extension: Rt isometrics 5" x10 each ER with yellow band 2x10  Trigger Point Dry Needling  Subsequent Treatment: Instructions reviewed, if requested by the patient, prior to subsequent dry needling treatment.   Patient Verbal Consent Given: Yes Education Handout Provided: Previously Provided Muscles Treated: Rt lateral deltoid and biceps  Electrical Stimulation Performed: No Treatment Response/Outcome: Utilized skilled palpation to identify bony landmarks and trigger points.  Able to illicit twitch response and muscle elongation.  Soft tissue mobilization to muscles needled following DN to further promote tissue elongation and decreased pain.         Date: 01/03/24:  Overhead pulleys: flexion x 3 min, abduction x2 min  UE ranger seated: flexion, circles bil x10 each  Flexion and extension: Rt isometrics 5" x10 each ER with yellow band 2x10  Trigger Point Dry Needling  Subsequent Treatment: Instructions reviewed, if requested by the patient, prior to subsequent dry needling treatment.   Patient Verbal Consent Given: Yes Education Handout Provided: Previously Provided Muscles Treated: Rt lateral deltoid and biceps  Electrical Stimulation Performed: No Treatment Response/Outcome: Utilized skilled palpation to identify bony landmarks and trigger points.  Able to illicit twitch response and muscle elongation.  Soft tissue mobilization to muscles needled following DN to further promote tissue elongation and decreased pain.       Date: 12/26/23:  Findings from evaluation discussed, pt educated on plan of care, HEP initiated.   Trigger Point Dry Needling  Initial Treatment: Pt instructed on Dry Needling rational, procedures, and possible side effects. Pt instructed to expect mild to moderate muscle soreness later in the day and/or into the next day.  Pt instructed in methods to reduce muscle soreness. Pt instructed to continue prescribed HEP. Patient was educated on signs and symptoms of infection and other risk factors and advised to seek medical attention should they occur.  Patient verbalized understanding of these instructions and education.   Patient Verbal Consent Given: Yes Education Handout Provided: Yes Muscles Treated: Rt lateral deltoid and biecps  Electrical Stimulation Performed: No Treatment Response/Outcome: Utilized skilled palpation to identify bony landmarks and trigger points.  Able to illicit twitch response and muscle elongation.    PATIENT EDUCATION:  Education details: Access Code: [email or web address] Person educated: Patient Education  method: Explanation, Demonstration, and Verbal cues Education comprehension:  verbalized understanding and returned demonstration  HOME EXERCISE PROGRAM: Access Code: [facility] URL: [email or web address] Date: 01/03/2024 Prepared by: Loetta Ringer  Exercises - Seated Shoulder Flexion Towel Slide at Table Top  - 3 x daily - 7 x weekly - 1 sets - 10 reps - 10-20 hold - Seated Shoulder Abduction Towel Slide at Table Top  - 3 x daily - 7 x weekly - 1 sets - 10 reps - 10-20 hold - Seated Neck Sidebending Stretch  - 3 x daily - 7 x weekly - 1 sets - 3 reps - 30 hold - Standing Shoulder External Rotation with Resistance  - 2 x daily - 7 x weekly - 2 sets - 10 reps - Standing Isometric Shoulder Extension with Doorway - Arm Bent  - 2 x daily - 7 x weekly - 1-2 sets - 10 reps - 5 hold - Standing Isometric Shoulder Flexion with Doorway - Arm Bent  - 2 x daily - 7 x weekly - 1-2 sets - 10 reps   ASSESSMENT: ASSESSMENT:  CLINICAL IMPRESSION: Pt reports 40% reduction in Rt shoulder pain since the start of care.  Session focused on ROM and gentle strength.  Improved quality of movement of Rt UE with exercises and able to advance today.  Good response to dry needling with improved tissue mobility and twitch response in Rt biceps and deltoids today. Patient will benefit from skilled PT to address the below impairments and improve overall function.    OBJECTIVE IMPAIRMENTS: decreased mobility, decreased strength, increased muscle spasms, and pain.   ACTIVITY LIMITATIONS: caring for others, reaching  PARTICIPATION LIMITATIONS: cleaning and laundry  PERSONAL FACTORS: Social background and Time since onset of injury/illness/exacerbation, Rt total shoulder replacement and radiation to region are also affecting patient's functional outcome.   REHAB POTENTIAL: Good  CLINICAL DECISION MAKING: Stable/uncomplicated  EVALUATION COMPLEXITY: Low  GOALS: Goals reviewed with patient? Yes  SHORT TERM GOALS: Target date: 01/23/2024     Be independent in initial  HEP Baseline: Goal status: in progress   2.  Report > or = to 30% reduction in Rt shoulder/arm pain with functional reaching tasks  Baseline:  Goal status: NEW  3.  Verbalize and demonstrate postural corrections to reduce scapular muscle tension  Baseline:  Goal status: NEW   LONG TERM GOALS: Target date:02/20/2024  Be independent in advanced HEP Baseline:  Goal status: NEW  2.  Improve QuickDASH to < or = to 40%  Baseline: 52.3% Goal status: NEW  3.  Report > or = to 60% reduction in Rt shoulder pain with reaching tasks  Baseline:  Goal status: NEW  4.  Demonstrate Rt shoulder A/ROM flexion to 95 degrees to improve reaching overhead due to improved functional strength  Baseline: 85 Goal status: NEW   PLAN: PT FREQUENCY: 1-2x/week  PT DURATION: 8 weeks  PLANNED INTERVENTIONS: Therapeutic exercises, Therapeutic activity, Neuromuscular re-education, Balance training, Gait training, Patient/Family education, Self Care, Joint mobilization, Stair training, Vestibular training, Canalith repositioning, Aquatic Therapy, Dry Needling, Electrical stimulation, Cryotherapy, Moist heat, Taping, Traction, Ultrasound, Ionotophoresis 4mg /ml Dexamethasone , Manual therapy, and Re-evaluation  PLAN FOR NEXT SESSION:  pulleys, postural strength, work on A/AROM to improve strength against gravity, assess response to dry needling and repeat if helpful  Luella Sager, PT 12/19/23 10:18 AM

## 2024-01-17 ENCOUNTER — Ambulatory Visit: Attending: General Surgery

## 2024-01-17 DIAGNOSIS — M6281 Muscle weakness (generalized): Secondary | ICD-10-CM

## 2024-01-17 DIAGNOSIS — M25511 Pain in right shoulder: Secondary | ICD-10-CM | POA: Diagnosis not present

## 2024-01-17 DIAGNOSIS — R293 Abnormal posture: Secondary | ICD-10-CM

## 2024-01-17 DIAGNOSIS — G8929 Other chronic pain: Secondary | ICD-10-CM | POA: Diagnosis not present

## 2024-01-17 DIAGNOSIS — M25611 Stiffness of right shoulder, not elsewhere classified: Secondary | ICD-10-CM

## 2024-01-17 NOTE — Therapy (Signed)
 OUTPATIENT PHYSICAL THERAPY TREATMENT   Patient Name: [name] [name] MRN: [id number] DOB:08-Apr-1951, 73 y.o., female Today's Date: 01/17/2024  END OF SESSION:  PT End of Session - 01/17/24 1313     Visit Number 4    Date for PT Re-Evaluation 02/20/24    Authorization Type [employer]    Progress Note Due on Visit 10    PT Start Time 1235    PT Stop Time 1316    PT Time Calculation (min) 41 min    Activity Tolerance Patient tolerated treatment well    Behavior During Therapy WFL for tasks assessed/performed                  Past Medical History:  Diagnosis Date   ADHD (attention deficit hyperactivity disorder)    Anemia    Anxiety    Arthritis    Depression    GERD (gastroesophageal reflux disease)    History of panic attacks    Hyperlipidemia    Hypertension    Hypothyroidism, postsurgical    Pre-diabetes    SBO (small bowel obstruction) (HCC) 01/15/2020   Sleep apnea    SUI (stress urinary incontinence, female)    Wears glasses    Past Surgical History:  Procedure Laterality Date   BREAST BIOPSY Right 10/07/2022   BREAST BIOPSY Right 10/07/2022   US  RT BREAST BX W LOC DEV 1ST LESION IMG BX SPEC US  GUIDE 10/07/2022 [facility]   BREAST BIOPSY  11/18/2022   MM RT RADIOACTIVE SEED LOC MAMMO GUIDE 11/18/2022 [facility]   BREAST LUMPECTOMY WITH RADIOACTIVE SEED AND SENTINEL LYMPH NODE BIOPSY Right 11/19/2022   Procedure: RIGHT BREAST LUMPECTOMY WITH RADIOACTIVE SEED;  Surgeon: Enid Harry, MD;  Location: [facility] OR;  Service: General;  Laterality: Right;   CATARACT EXTRACTION Bilateral    2023   D & C HYSTEROSCOPY W/ RESECTION ENDOMETRIAL POLYP  04/15/2009   D & C HYSTEROSCOPY W/ RESECTOSCOPE AND THERMACHOICE ENDOMETRIAL ABLATION  12/09/2010   EXCISION MELANOMA WITH SENTINEL LYMPH NODE BIOPSY Right 11/19/2022   Procedure: SENTINEL LYMPH NODE BIOPSY;  Surgeon: Enid Harry, MD;  Location: [facility] OR;  Service: General;  Laterality: Right;    LAPAROSCOPIC ASSISTED VENTRAL HERNIA REPAIR Left 01/16/2020   Procedure: LAPAROSCOPIC ASSISTED HERNIA REPAIR;  Surgeon: Adalberto Acton, MD;  Location: [facility] OR;  Service: General;  Laterality: Left;   PUBOVAGINAL SLING N/A 07/26/2016   Procedure: PUBO-VAGINAL SLING ALTIS SLING;  Surgeon: Annamarie Kid, MD;  Location: [facility] [place];  Service: Urology;  Laterality: N/A;   REPAIR SPIGELIAN HERNIA     RESECTION GIANT CELL TUMOR RIGHT LONG FINGER  09/13/2008   REVERSE SHOULDER ARTHROPLASTY Right 07/16/2021   Procedure: REVERSE SHOULDER ARTHROPLASTY;  Surgeon: Sammye Cristal, MD;  Location: [facility];  Service: Orthopedics;  Laterality: Right;   ROTATOR CUFF REPAIR Bilateral    THYROIDECTOMY  1979    [facility]   non-malignant diseased thyroid    THYROIDECTOMY Left 03/17/2021   Procedure: COMPLETION THYROIDECTOMY, LEFT LOBE;  Surgeon: Oralee Billow, MD;  Location: [facility];  Service: General;  Laterality: Left;   TOTAL KNEE ARTHROPLASTY Right 12/17/2019   Procedure: RIGHT TOTAL KNEE ARTHROPLASTY;  Surgeon: Wendolyn Hamburger, MD;  Location: [facility];  Service: Orthopedics;  Laterality: Right;   TOTAL KNEE ARTHROPLASTY Left 03/03/2020   Procedure: LEFT TOTAL KNEE ARTHROPLASTY;  Surgeon: Wendolyn Hamburger, MD;  Location: [facility];  Service: Orthopedics;  Laterality: Left;   TRIPLE ARTHRODESIS LEFT FOOT  04/20/2007   VENTRAL HERNIA  REPAIR  2003   WRIST GANGLION EXCISION Right 1975   Patient Active Problem List   Diagnosis Date Noted   Genetic testing 11/01/2022   Family history of breast cancer 10/21/2022   Malignant neoplasm of lower-inner quadrant of right breast of female, estrogen receptor positive (HCC) 10/18/2022   Neoplasm of uncertain behavior of thyroid  gland 03/15/2021   Left thyroid  nodule 03/15/2021   S/P TKR (total knee replacement) using cement, left 03/03/2020   Degenerative arthritis of left knee 02/29/2020   SBO (small bowel obstruction) (HCC) 01/15/2020   S/P TKR (total knee  replacement), right 12/19/2019   Total knee replacement status, right 12/17/2019   Osteoarthritis of right knee 12/14/2019   Obesity, morbid (HCC) 12/16/2016   Strain of iliopsoas muscle, initial encounter 07/15/2016   Elevated cholesterol    Urinary incontinence    Endometrial polyp    Primary osteoarthritis of knees, bilateral 05/15/2009   DERANGEMENT OF ANTERIOR HORN OF LATERAL MENISCUS 05/15/2009   KNEE PAIN 05/15/2009    REFERRING PROVIDER: Enid Harry, MD  REFERRING DIAG: Diagnosis  M25.511 (ICD-10-CM) - Pain in joint of right shoulder    THERAPY DIAG:  Muscle weakness (generalized)  Stiffness of right shoulder, not elsewhere classified  Chronic right shoulder pain  Abnormal posture  Rationale for Evaluation and Treatment: Rehabilitation  ONSET DATE: 11/19/2022  SUBJECTIVE:                                                                                                                                                                                           SUBJECTIVE STATEMENT: I am 50% better since the start of care.  Dry needling has helped and I haven't been doing my exercises as much due to being busy.    PERTINENT HISTORY:  Patient was diagnosed on 09/21/2022 with right grade 2 invasive ductal carcinoma breast cancer. She underwent a right lumpectomy and sentinel node biopsy on 11/19/2022 but there as not nodal tissue in the sentinel node biopsy. Final pathology was ER/PR positive and HER2 negative with a Ki67 of 2%. Hx left rotator cuff repair 08/05/20. Pins placed in left foot 2003. Hx of right shoulder replacement 07/16/2021.   PATIENT GOALS:  improve Rt shoulder function  PAIN: 01/17/24 Are you having pain? No and Yes: NPRS scale: 1/10 (brief 6/10 pain with certain movements) Pain location: Rt lateral arm above the elbow Pain description: intense, brief Aggravating factors: doing crafts (makes angels), pushing up off sofa, reaching out  Relieving factors:  stopping the aggravating activity, not using it, being in the pool  PRECAUTIONS: Recent Surgery, right UE Lymphedema risk, Other: previous right shoulder replacement  07/16/2021  ACTIVITY LEVEL / LEISURE: deep water  aerobics (2x/wk), crafts (makes angels)   OBJECTIVE:   PATIENT SURVEYS:  12/26/23: QUICK DASH:52.3%    POSTURE:  Forward head and rounded shoulders posture   UPPER EXTREMITY AROM/PROM:   A/PROM RIGHT   eval   Right 01/17/24  Shoulder extension 41   Shoulder flexion 85 85  Shoulder abduction 70 85  Shoulder internal rotation T12 with walking up back T11 walking up back   Shoulder external rotation To Rt lateral occiput Rt occiput medialy                          (Blank rows = not tested)   Lt is WFLs CERVICAL AROM:     Percent limited 12/26/23  Flexion WNL  Extension 25% limited  Right lateral flexion 50% limited  Left lateral flexion 50% limited  Right rotation 25% limited   Left rotation 25 % limited      UPPER EXTREMITY STRENGTH:  Tested in neutral: 4+/5 Rt shoulder, against gravity: flexion 3-/5 with limited ROM, abduction 3/5, IR/ER 4/5     TREATMENT:  Date: 01/17/24:  Overhead pulleys: flexion x 3 min, abduction x2 min  Wall ladder: flexion 2x5, scaption 2x5- tactile and verbal cues for  UE ranger on wall: flexion x10 Standing shoulder extension and flexion with red band x10 Flexion and extension: Rt isometrics 5" x10 each ER with red band 2x10  Wall push-ups 2x10 Seated shoulder flexion x10 with emphasis on scapular depression  Date: 01/10/24:  Overhead pulleys: flexion x 3 min, abduction x2 min  UE ranger seated: flexion, circles bil x10 each  UE ranger on wall: flexion x10 Standing shoulder extension and flexion with yellow band x10 Flexion and extension: Rt isometrics 5" x10 each ER with yellow band 2x10  Trigger Point Dry Needling  Subsequent Treatment: Instructions reviewed, if requested by the patient, prior to subsequent dry needling  treatment.   Patient Verbal Consent Given: Yes Education Handout Provided: Previously Provided Muscles Treated: Rt lateral deltoid and biceps  Electrical Stimulation Performed: No Treatment Response/Outcome: Utilized skilled palpation to identify bony landmarks and trigger points.  Able to illicit twitch response and muscle elongation.  Soft tissue mobilization to muscles needled following DN to further promote tissue elongation and decreased pain.        Date: 01/03/24:  Overhead pulleys: flexion x 3 min, abduction x2 min  UE ranger seated: flexion, circles bil x10 each  Flexion and extension: Rt isometrics 5" x10 each ER with yellow band 2x10  Trigger Point Dry Needling  Subsequent Treatment: Instructions reviewed, if requested by the patient, prior to subsequent dry needling treatment.   Patient Verbal Consent Given: Yes Education Handout Provided: Previously Provided Muscles Treated: Rt lateral deltoid and biceps  Electrical Stimulation Performed: No Treatment Response/Outcome: Utilized skilled palpation to identify bony landmarks and trigger points.  Able to illicit twitch response and muscle elongation.  Soft tissue mobilization to muscles needled following DN to further promote tissue elongation and decreased pain.        PATIENT EDUCATION:  Education details: Access Code: [email or web address] Person educated: Patient Education method: Explanation, Demonstration, and Verbal cues Education comprehension: verbalized understanding and returned demonstration  HOME EXERCISE PROGRAM: Access Code: [facility] URL: [email or web address] Date: 01/03/2024 Prepared by: Loetta Ringer  Exercises - Seated Shoulder Flexion Towel Slide at Table Top  - 3 x daily - 7 x weekly - 1 sets - 10 reps - 10-20 hold -  Seated Shoulder Abduction Towel Slide at Table Top  - 3 x daily - 7 x weekly - 1 sets - 10 reps - 10-20 hold - Seated Neck Sidebending Stretch  - 3 x daily - 7 x weekly - 1 sets - 3 reps -  30 hold - Standing Shoulder External Rotation with Resistance  - 2 x daily - 7 x weekly - 2 sets - 10 reps - Standing Isometric Shoulder Extension with Doorway - Arm Bent  - 2 x daily - 7 x weekly - 1-2 sets - 10 reps - 5 hold - Standing Isometric Shoulder Flexion with Doorway - Arm Bent  - 2 x daily - 7 x weekly - 1-2 sets - 10 reps   ASSESSMENT: ASSESSMENT:  CLINICAL IMPRESSION: Pt reports 50% reduction in Rt shoulder pain since the start of care.  She did well with advancement of exercises in the clinic today.   Improved quality of movement of Rt UE with exercises and able to advance today.  Improved Rt shoulder abduction and other motions appear to be limited by functional strength deficits.  Patient will benefit from skilled PT to address the below impairments and improve overall function.    OBJECTIVE IMPAIRMENTS: decreased mobility, decreased strength, increased muscle spasms, and pain.   ACTIVITY LIMITATIONS: caring for others, reaching  PARTICIPATION LIMITATIONS: cleaning and laundry  PERSONAL FACTORS: Social background and Time since onset of injury/illness/exacerbation, Rt total shoulder replacement and radiation to region are also affecting patient's functional outcome.   REHAB POTENTIAL: Good  CLINICAL DECISION MAKING: Stable/uncomplicated  EVALUATION COMPLEXITY: Low  GOALS: Goals reviewed with patient? Yes  SHORT TERM GOALS: Target date: 01/23/2024     Be independent in initial HEP Baseline: working on being more consistent (01/17/24) Goal status: in progress   2.  Report > or = to 30% reduction in Rt shoulder/arm pain with functional reaching tasks  Baseline: 50% (01/17/24) Goal status: MET  3.  Verbalize and demonstrate postural corrections to reduce scapular muscle tension  Baseline: making corrections at home and during PT session (01/17/24) Goal status:  in progress    LONG TERM GOALS: Target date:02/20/2024  Be independent in advanced HEP Baseline:  Goal  status: NEW  2.  Improve QuickDASH to < or = to 40%  Baseline: 52.3% Goal status: NEW  3.  Report > or = to 60% reduction in Rt shoulder pain with reaching tasks  Baseline:  Goal status: NEW  4.  Demonstrate Rt shoulder A/ROM flexion to 95 degrees to improve reaching overhead due to improved functional strength  Baseline: 85 (01/17/24) Goal status: NEW   PLAN: PT FREQUENCY: 1-2x/week  PT DURATION: 8 weeks  PLANNED INTERVENTIONS: Therapeutic exercises, Therapeutic activity, Neuromuscular re-education, Balance training, Gait training, Patient/Family education, Self Care, Joint mobilization, Stair training, Vestibular training, Canalith repositioning, Aquatic Therapy, Dry Needling, Electrical stimulation, Cryotherapy, Moist heat, Taping, Traction, Ultrasound, Ionotophoresis 4mg /ml Dexamethasone , Manual therapy, and Re-evaluation  PLAN FOR NEXT SESSION:  pulleys, postural strength, work on A/AROM to improve strength against gravity  [employer], PT 12/19/23 10:18 AM

## 2024-01-18 DIAGNOSIS — G4733 Obstructive sleep apnea (adult) (pediatric): Secondary | ICD-10-CM | POA: Diagnosis not present

## 2024-01-19 DIAGNOSIS — G4733 Obstructive sleep apnea (adult) (pediatric): Secondary | ICD-10-CM | POA: Diagnosis not present

## 2024-01-19 DIAGNOSIS — Z6841 Body Mass Index (BMI) 40.0 and over, adult: Secondary | ICD-10-CM | POA: Diagnosis not present

## 2024-01-19 DIAGNOSIS — E559 Vitamin D deficiency, unspecified: Secondary | ICD-10-CM | POA: Diagnosis not present

## 2024-01-19 DIAGNOSIS — E65 Localized adiposity: Secondary | ICD-10-CM | POA: Diagnosis not present

## 2024-01-19 DIAGNOSIS — I10 Essential (primary) hypertension: Secondary | ICD-10-CM | POA: Diagnosis not present

## 2024-01-19 DIAGNOSIS — M8580 Other specified disorders of bone density and structure, unspecified site: Secondary | ICD-10-CM | POA: Diagnosis not present

## 2024-01-19 DIAGNOSIS — E66813 Obesity, class 3: Secondary | ICD-10-CM | POA: Diagnosis not present

## 2024-01-19 DIAGNOSIS — R7303 Prediabetes: Secondary | ICD-10-CM | POA: Diagnosis not present

## 2024-01-20 DIAGNOSIS — M19072 Primary osteoarthritis, left ankle and foot: Secondary | ICD-10-CM | POA: Diagnosis not present

## 2024-01-24 ENCOUNTER — Ambulatory Visit

## 2024-01-24 DIAGNOSIS — M6281 Muscle weakness (generalized): Secondary | ICD-10-CM | POA: Diagnosis not present

## 2024-01-24 DIAGNOSIS — M25611 Stiffness of right shoulder, not elsewhere classified: Secondary | ICD-10-CM

## 2024-01-24 DIAGNOSIS — G8929 Other chronic pain: Secondary | ICD-10-CM

## 2024-01-24 DIAGNOSIS — R293 Abnormal posture: Secondary | ICD-10-CM

## 2024-01-24 NOTE — Therapy (Signed)
 OUTPATIENT PHYSICAL THERAPY TREATMENT   Patient Name: [name] [name] MRN: [id number] DOB:24-Jul-1951, 73 y.o., female Today's Date: 01/24/2024  END OF SESSION:  PT End of Session - 01/24/24 1322     Visit Number 5    Date for PT Re-Evaluation 02/20/24    Authorization Type [employer]    Progress Note Due on Visit 10    PT Start Time 1237    PT Stop Time 1318    PT Time Calculation (min) 41 min    Activity Tolerance Patient tolerated treatment well    Behavior During Therapy WFL for tasks assessed/performed                   Past Medical History:  Diagnosis Date   ADHD (attention deficit hyperactivity disorder)    Anemia    Anxiety    Arthritis    Depression    GERD (gastroesophageal reflux disease)    History of panic attacks    Hyperlipidemia    Hypertension    Hypothyroidism, postsurgical    Pre-diabetes    SBO (small bowel obstruction) (HCC) 01/15/2020   Sleep apnea    SUI (stress urinary incontinence, female)    Wears glasses    Past Surgical History:  Procedure Laterality Date   BREAST BIOPSY Right 10/07/2022   BREAST BIOPSY Right 10/07/2022   US  RT BREAST BX W LOC DEV 1ST LESION IMG BX SPEC US  GUIDE 10/07/2022 [facility]   BREAST BIOPSY  11/18/2022   MM RT RADIOACTIVE SEED LOC MAMMO GUIDE 11/18/2022 [facility]   BREAST LUMPECTOMY WITH RADIOACTIVE SEED AND SENTINEL LYMPH NODE BIOPSY Right 11/19/2022   Procedure: RIGHT BREAST LUMPECTOMY WITH RADIOACTIVE SEED;  Surgeon: Enid Harry, MD;  Location: [facility] OR;  Service: General;  Laterality: Right;   CATARACT EXTRACTION Bilateral    2023   D & C HYSTEROSCOPY W/ RESECTION ENDOMETRIAL POLYP  04/15/2009   D & C HYSTEROSCOPY W/ RESECTOSCOPE AND THERMACHOICE ENDOMETRIAL ABLATION  12/09/2010   EXCISION MELANOMA WITH SENTINEL LYMPH NODE BIOPSY Right 11/19/2022   Procedure: SENTINEL LYMPH NODE BIOPSY;  Surgeon: Enid Harry, MD;  Location: [facility] OR;  Service: General;  Laterality: Right;    LAPAROSCOPIC ASSISTED VENTRAL HERNIA REPAIR Left 01/16/2020   Procedure: LAPAROSCOPIC ASSISTED HERNIA REPAIR;  Surgeon: Adalberto Acton, MD;  Location: [facility] OR;  Service: General;  Laterality: Left;   PUBOVAGINAL SLING N/A 07/26/2016   Procedure: PUBO-VAGINAL SLING ALTIS SLING;  Surgeon: Annamarie Kid, MD;  Location: [facility] [place];  Service: Urology;  Laterality: N/A;   REPAIR SPIGELIAN HERNIA     RESECTION GIANT CELL TUMOR RIGHT LONG FINGER  09/13/2008   REVERSE SHOULDER ARTHROPLASTY Right 07/16/2021   Procedure: REVERSE SHOULDER ARTHROPLASTY;  Surgeon: Sammye Cristal, MD;  Location: [facility];  Service: Orthopedics;  Laterality: Right;   ROTATOR CUFF REPAIR Bilateral    THYROIDECTOMY  1979    [facility]   non-malignant diseased thyroid    THYROIDECTOMY Left 03/17/2021   Procedure: COMPLETION THYROIDECTOMY, LEFT LOBE;  Surgeon: Oralee Billow, MD;  Location: [facility];  Service: General;  Laterality: Left;   TOTAL KNEE ARTHROPLASTY Right 12/17/2019   Procedure: RIGHT TOTAL KNEE ARTHROPLASTY;  Surgeon: Wendolyn Hamburger, MD;  Location: [facility];  Service: Orthopedics;  Laterality: Right;   TOTAL KNEE ARTHROPLASTY Left 03/03/2020   Procedure: LEFT TOTAL KNEE ARTHROPLASTY;  Surgeon: Wendolyn Hamburger, MD;  Location: [facility];  Service: Orthopedics;  Laterality: Left;   TRIPLE ARTHRODESIS LEFT FOOT  04/20/2007   VENTRAL  HERNIA REPAIR  2003   WRIST GANGLION EXCISION Right 1975   Patient Active Problem List   Diagnosis Date Noted   Genetic testing 11/01/2022   Family history of breast cancer 10/21/2022   Malignant neoplasm of lower-inner quadrant of right breast of female, estrogen receptor positive (HCC) 10/18/2022   Neoplasm of uncertain behavior of thyroid  gland 03/15/2021   Left thyroid  nodule 03/15/2021   S/P TKR (total knee replacement) using cement, left 03/03/2020   Degenerative arthritis of left knee 02/29/2020   SBO (small bowel obstruction) (HCC) 01/15/2020   S/P TKR (total knee  replacement), right 12/19/2019   Total knee replacement status, right 12/17/2019   Osteoarthritis of right knee 12/14/2019   Obesity, morbid (HCC) 12/16/2016   Strain of iliopsoas muscle, initial encounter 07/15/2016   Elevated cholesterol    Urinary incontinence    Endometrial polyp    Primary osteoarthritis of knees, bilateral 05/15/2009   DERANGEMENT OF ANTERIOR HORN OF LATERAL MENISCUS 05/15/2009   KNEE PAIN 05/15/2009    REFERRING PROVIDER: Enid Harry, MD  REFERRING DIAG: Diagnosis  M25.511 (ICD-10-CM) - Pain in joint of right shoulder    THERAPY DIAG:  Muscle weakness (generalized)  Stiffness of right shoulder, not elsewhere classified  Chronic right shoulder pain  Abnormal posture  Rationale for Evaluation and Treatment: Rehabilitation  ONSET DATE: 11/19/2022  SUBJECTIVE:                                                                                                                                                                                           SUBJECTIVE STATEMENT: I am 55% better since the start of care.  Dry needling has helped and II want to do it again.  I no longer get the intense pain.   PERTINENT HISTORY:  Patient was diagnosed on 09/21/2022 with right grade 2 invasive ductal carcinoma breast cancer. She underwent a right lumpectomy and sentinel node biopsy on 11/19/2022 but there as not nodal tissue in the sentinel node biopsy. Final pathology was ER/PR positive and HER2 negative with a Ki67 of 2%. Hx left rotator cuff repair 08/05/20. Pins placed in left foot 2003. Hx of right shoulder replacement 07/16/2021.   PATIENT GOALS:  improve Rt shoulder function  PAIN: 01/17/24 Are you having pain? No and Yes: NPRS scale: 1/10 (brief 6/10 pain with certain movements) Pain location: Rt lateral arm above the elbow Pain description: intense, brief Aggravating factors: doing crafts (makes angels), pushing up off sofa, reaching out  Relieving factors:  stopping the aggravating activity, not using it, being in the pool  PRECAUTIONS: Recent Surgery, right UE Lymphedema risk, Other: previous right  shoulder replacement 07/16/2021  ACTIVITY LEVEL / LEISURE: deep water  aerobics (2x/wk), crafts (makes angels)   OBJECTIVE:   PATIENT SURVEYS:  12/26/23: QUICK DASH:52.3%    POSTURE:  Forward head and rounded shoulders posture   UPPER EXTREMITY AROM/PROM:   A/PROM RIGHT   eval   Right 01/17/24  Shoulder extension 41   Shoulder flexion 85 85  Shoulder abduction 70 85  Shoulder internal rotation T12 with walking up back T11 walking up back   Shoulder external rotation To Rt lateral occiput Rt occiput medialy                          (Blank rows = not tested)   Lt is WFLs CERVICAL AROM:     Percent limited 12/26/23  Flexion WNL  Extension 25% limited  Right lateral flexion 50% limited  Left lateral flexion 50% limited  Right rotation 25% limited   Left rotation 25 % limited      UPPER EXTREMITY STRENGTH:  Tested in neutral: 4+/5 Rt shoulder, against gravity: flexion 3-/5 with limited ROM, abduction 3/5, IR/ER 4/5     TREATMENT:   Date: 01/24/24:  Overhead pulleys: flexion x 3 min, abduction x2 min  3 way raises: Rt x15 each UE ranger on wall: flexion x15, abduction 15 Standing shoulder extension and flexion with red band 2x10 Finger ladder: flexion and abduction x15 ER with red band 2x10  Trigger Point Dry Needling  Subsequent Treatment: Instructions reviewed, if requested by the patient, prior to subsequent dry needling treatment.  Patient Verbal Consent Given: Yes Education Handout Provided: Previously Provided Muscles Treated: Rt lateral deltoid and biceps  Electrical Stimulation Performed: No Treatment Response/Outcome: Utilized skilled palpation to identify bony landmarks and trigger points.  Able to illicit twitch response and muscle elongation.  Soft tissue mobilization to muscles needled following DN to further  promote tissue elongation and decreased pain.      Date: 01/17/24:  Overhead pulleys: flexion x 3 min, abduction x2 min  Wall ladder: flexion 2x5, scaption 2x5- tactile and verbal cues for  UE ranger on wall: flexion x10 Standing shoulder extension and flexion with red band x10 Flexion and extension: Rt isometrics 5" x10 each ER with red band 2x10  Wall push-ups 2x10 Seated shoulder flexion x10 with emphasis on scapular depression  Date: 01/10/24:  Overhead pulleys: flexion x 3 min, abduction x2 min  UE ranger seated: flexion, circles bil x10 each  UE ranger on wall: flexion x10 Standing shoulder extension and flexion with yellow band x10 Flexion and extension: Rt isometrics 5" x10 each ER with yellow band 2x10  Trigger Point Dry Needling  Subsequent Treatment: Instructions reviewed, if requested by the patient, prior to subsequent dry needling treatment.   Patient Verbal Consent Given: Yes Education Handout Provided: Previously Provided Muscles Treated: Rt lateral deltoid and biceps  Electrical Stimulation Performed: No Treatment Response/Outcome: Utilized skilled palpation to identify bony landmarks and trigger points.  Able to illicit twitch response and muscle elongation.  Soft tissue mobilization to muscles needled following DN to further promote tissue elongation and decreased pain.        PATIENT EDUCATION:  Education details: Access Code: [email or web address] Person educated: Patient Education method: Explanation, Demonstration, and Verbal cues Education comprehension: verbalized understanding and returned demonstration  HOME EXERCISE PROGRAM: Access Code: [facility] URL: [email or web address] Date: 01/03/2024 Prepared by: Loetta Ringer  Exercises - Seated Shoulder Flexion Towel Slide at Table Top  - 3 x daily - 7  x weekly - 1 sets - 10 reps - 10-20 hold - Seated Shoulder Abduction Towel Slide at Table Top  - 3 x daily - 7 x weekly - 1 sets - 10 reps - 10-20 hold - Seated  Neck Sidebending Stretch  - 3 x daily - 7 x weekly - 1 sets - 3 reps - 30 hold - Standing Shoulder External Rotation with Resistance  - 2 x daily - 7 x weekly - 2 sets - 10 reps - Standing Isometric Shoulder Extension with Doorway - Arm Bent  - 2 x daily - 7 x weekly - 1-2 sets - 10 reps - 5 hold - Standing Isometric Shoulder Flexion with Doorway - Arm Bent  - 2 x daily - 7 x weekly - 1-2 sets - 10 reps   ASSESSMENT: ASSESSMENT:  CLINICAL IMPRESSION: Pt reports 55% reduction in Rt shoulder pain since the start of care.  She advanced to red band today and issued for HEP.   Improved quality of movement of Rt UE with exercises and requires tactile cues for scapular depression. Good response to dry needling with improved tissue mobility and twitch response.  Patient will benefit from skilled PT to address the below impairments and improve overall function.    OBJECTIVE IMPAIRMENTS: decreased mobility, decreased strength, increased muscle spasms, and pain.   ACTIVITY LIMITATIONS: caring for others, reaching  PARTICIPATION LIMITATIONS: cleaning and laundry  PERSONAL FACTORS: Social background and Time since onset of injury/illness/exacerbation, Rt total shoulder replacement and radiation to region are also affecting patient's functional outcome.   REHAB POTENTIAL: Good  CLINICAL DECISION MAKING: Stable/uncomplicated  EVALUATION COMPLEXITY: Low  GOALS: Goals reviewed with patient? Yes  SHORT TERM GOALS: Target date: 01/23/2024     Be independent in initial HEP Baseline: working on being more consistent (01/17/24) Goal status: in progress   2.  Report > or = to 30% reduction in Rt shoulder/arm pain with functional reaching tasks  Baseline: 50% (01/17/24) Goal status: MET  3.  Verbalize and demonstrate postural corrections to reduce scapular muscle tension  Baseline: making corrections at home and during PT session (01/17/24) Goal status:  in progress    LONG TERM GOALS: Target  date:02/20/2024  Be independent in advanced HEP Baseline:  Goal status: NEW  2.  Improve QuickDASH to < or = to 40%  Baseline: 52.3% Goal status: NEW  3.  Report > or = to 60% reduction in Rt shoulder pain with reaching tasks  Baseline:  Goal status: NEW  4.  Demonstrate Rt shoulder A/ROM flexion to 95 degrees to improve reaching overhead due to improved functional strength  Baseline: 85 (01/17/24) Goal status: NEW   PLAN: PT FREQUENCY: 1-2x/week  PT DURATION: 8 weeks  PLANNED INTERVENTIONS: Therapeutic exercises, Therapeutic activity, Neuromuscular re-education, Balance training, Gait training, Patient/Family education, Self Care, Joint mobilization, Stair training, Vestibular training, Canalith repositioning, Aquatic Therapy, Dry Needling, Electrical stimulation, Cryotherapy, Moist heat, Taping, Traction, Ultrasound, Ionotophoresis 4mg /ml Dexamethasone , Manual therapy, and Re-evaluation  PLAN FOR NEXT SESSION:  assess response to dry needling, pulleys, postural strength, work on A/AROM to improve strength against gravity  [employer], PT 12/19/23 10:18 AM

## 2024-01-30 ENCOUNTER — Telehealth: Payer: Self-pay

## 2024-01-30 ENCOUNTER — Ambulatory Visit

## 2024-01-30 NOTE — Telephone Encounter (Signed)
PT called pt due to no-show appt.

## 2024-02-09 ENCOUNTER — Other Ambulatory Visit: Payer: Self-pay | Admitting: Hematology and Oncology

## 2024-02-14 ENCOUNTER — Ambulatory Visit: Attending: General Surgery

## 2024-02-14 DIAGNOSIS — M25511 Pain in right shoulder: Secondary | ICD-10-CM | POA: Insufficient documentation

## 2024-02-14 DIAGNOSIS — M6281 Muscle weakness (generalized): Secondary | ICD-10-CM | POA: Insufficient documentation

## 2024-02-14 DIAGNOSIS — G8929 Other chronic pain: Secondary | ICD-10-CM | POA: Diagnosis not present

## 2024-02-14 DIAGNOSIS — M25611 Stiffness of right shoulder, not elsewhere classified: Secondary | ICD-10-CM | POA: Insufficient documentation

## 2024-02-14 DIAGNOSIS — R293 Abnormal posture: Secondary | ICD-10-CM | POA: Diagnosis not present

## 2024-02-14 DIAGNOSIS — Z483 Aftercare following surgery for neoplasm: Secondary | ICD-10-CM | POA: Insufficient documentation

## 2024-02-14 NOTE — Therapy (Signed)
 OUTPATIENT PHYSICAL THERAPY TREATMENT   Patient Name: [name] [name] MRN: [id number] DOB:Dec 04, 1950, 73 y.o., female Today's Date: 02/14/2024  END OF SESSION:  PT End of Session - 02/14/24 1312     Visit Number 6    Date for PT Re-Evaluation 02/20/24    Authorization Type [employer]    Progress Note Due on Visit --    PT Start Time 1235    PT Stop Time 1309    PT Time Calculation (min) 34 min    Activity Tolerance Patient tolerated treatment well    Behavior During Therapy WFL for tasks assessed/performed                 Past Medical History:  Diagnosis Date   ADHD (attention deficit hyperactivity disorder)    Anemia    Anxiety    Arthritis    Depression    GERD (gastroesophageal reflux disease)    History of panic attacks    Hyperlipidemia    Hypertension    Hypothyroidism, postsurgical    Pre-diabetes    SBO (small bowel obstruction) (HCC) 01/15/2020   Sleep apnea    SUI (stress urinary incontinence, female)    Wears glasses    Past Surgical History:  Procedure Laterality Date   BREAST BIOPSY Right 10/07/2022   BREAST BIOPSY Right 10/07/2022   US  RT BREAST BX W LOC DEV 1ST LESION IMG BX SPEC US  GUIDE 10/07/2022 [facility]   BREAST BIOPSY  11/18/2022   MM RT RADIOACTIVE SEED LOC MAMMO GUIDE 11/18/2022 [facility]   BREAST LUMPECTOMY WITH RADIOACTIVE SEED AND SENTINEL LYMPH NODE BIOPSY Right 11/19/2022   Procedure: RIGHT BREAST LUMPECTOMY WITH RADIOACTIVE SEED;  Surgeon: Ebbie Cough, MD;  Location: [facility] OR;  Service: General;  Laterality: Right;   CATARACT EXTRACTION Bilateral    2023   D & C HYSTEROSCOPY W/ RESECTION ENDOMETRIAL POLYP  04/15/2009   D & C HYSTEROSCOPY W/ RESECTOSCOPE AND THERMACHOICE ENDOMETRIAL ABLATION  12/09/2010   EXCISION MELANOMA WITH SENTINEL LYMPH NODE BIOPSY Right 11/19/2022   Procedure: SENTINEL LYMPH NODE BIOPSY;  Surgeon: Ebbie Cough, MD;  Location: [facility] OR;  Service: General;  Laterality: Right;    LAPAROSCOPIC ASSISTED VENTRAL HERNIA REPAIR Left 01/16/2020   Procedure: LAPAROSCOPIC ASSISTED HERNIA REPAIR;  Surgeon: Signe Mitzie LABOR, MD;  Location: [facility] OR;  Service: General;  Laterality: Left;   PUBOVAGINAL SLING N/A 07/26/2016   Procedure: PUBO-VAGINAL SLING ALTIS SLING;  Surgeon: Arlena Gal, MD;  Location: [facility];  Service: Urology;  Laterality: N/A;   REPAIR SPIGELIAN HERNIA     RESECTION GIANT CELL TUMOR RIGHT LONG FINGER  09/13/2008   REVERSE SHOULDER ARTHROPLASTY Right 07/16/2021   Procedure: REVERSE SHOULDER ARTHROPLASTY;  Surgeon: Dozier Soulier, MD;  Location: [facility];  Service: Orthopedics;  Laterality: Right;   ROTATOR CUFF REPAIR Bilateral    THYROIDECTOMY  1979    [facility]   non-malignant diseased thyroid    THYROIDECTOMY Left 03/17/2021   Procedure: COMPLETION THYROIDECTOMY, LEFT LOBE;  Surgeon: Eletha Boas, MD;  Location: [facility];  Service: General;  Laterality: Left;   TOTAL KNEE ARTHROPLASTY Right 12/17/2019   Procedure: RIGHT TOTAL KNEE ARTHROPLASTY;  Surgeon: Liam Lerner, MD;  Location: [facility];  Service: Orthopedics;  Laterality: Right;   TOTAL KNEE ARTHROPLASTY Left 03/03/2020   Procedure: LEFT TOTAL KNEE ARTHROPLASTY;  Surgeon: Liam Lerner, MD;  Location: [facility];  Service: Orthopedics;  Laterality: Left;   TRIPLE ARTHRODESIS LEFT FOOT  04/20/2007   VENTRAL HERNIA REPAIR  2003   WRIST GANGLION EXCISION Right 1975   Patient Active Problem List   Diagnosis Date Noted   Genetic testing 11/01/2022   Family history of breast cancer 10/21/2022   Malignant neoplasm of lower-inner quadrant of right breast of female, estrogen receptor positive (HCC) 10/18/2022   Neoplasm of uncertain behavior of thyroid  gland 03/15/2021   Left thyroid  nodule 03/15/2021   S/P TKR (total knee replacement) using cement, left 03/03/2020   Degenerative arthritis of left knee 02/29/2020   SBO (small bowel obstruction) (HCC) 01/15/2020   S/P TKR (total knee  replacement), right 12/19/2019   Total knee replacement status, right 12/17/2019   Osteoarthritis of right knee 12/14/2019   Obesity, morbid (HCC) 12/16/2016   Strain of iliopsoas muscle, initial encounter 07/15/2016   Elevated cholesterol    Urinary incontinence    Endometrial polyp    Primary osteoarthritis of knees, bilateral 05/15/2009   DERANGEMENT OF ANTERIOR HORN OF LATERAL MENISCUS 05/15/2009   KNEE PAIN 05/15/2009    REFERRING PROVIDER: Ebbie Cough, MD  REFERRING DIAG: Diagnosis  M25.511 (ICD-10-CM) - Pain in joint of right shoulder    THERAPY DIAG:  Muscle weakness (generalized)  Stiffness of right shoulder, not elsewhere classified  Chronic right shoulder pain  Abnormal posture  Rationale for Evaluation and Treatment: Rehabilitation  ONSET DATE: 11/19/2022  SUBJECTIVE:                                                                                                                                                                                           SUBJECTIVE STATEMENT: I am better and ready for D/C.  The muscle feels better and I know what to work on at home.  I have brief episodes of pain with reaching out.    PERTINENT HISTORY:  Patient was diagnosed on 09/21/2022 with right grade 2 invasive ductal carcinoma breast cancer. She underwent a right lumpectomy and sentinel node biopsy on 11/19/2022 but there as not nodal tissue in the sentinel node biopsy. Final pathology was ER/PR positive and HER2 negative with a Ki67 of 2%. Hx left rotator cuff repair 08/05/20. Pins placed in left foot 2003. Hx of right shoulder replacement 07/16/2021.   PATIENT GOALS:  improve Rt shoulder function  PAIN: 02/14/24 Are you having pain? No and Yes: NPRS scale: 1/10 (brief 6/10 pain with certain movements) Pain location: Rt lateral arm above the elbow Pain description: intense, brief Aggravating factors: doing crafts (makes angels), pushing up off sofa, reaching out   Relieving factors: stopping the aggravating activity, not using it, being in the pool  PRECAUTIONS: Recent Surgery, right UE Lymphedema risk, Other: previous right  shoulder replacement 07/16/2021  ACTIVITY LEVEL / LEISURE: deep water  aerobics (2x/wk), crafts (makes angels)   OBJECTIVE:   PATIENT SURVEYS:  12/26/23: QUICK DASH:52.3%  02/14/24: 27.3% QuickDASH   POSTURE:  Forward head and rounded shoulders posture   UPPER EXTREMITY AROM/PROM:   A/PROM RIGHT   eval   Right 01/17/24  Shoulder extension 41   Shoulder flexion 85 85  Shoulder abduction 70 85  Shoulder internal rotation T12 with walking up back T11 walking up back   Shoulder external rotation To Rt lateral occiput Rt occiput medialy                          (Blank rows = not tested)   Lt is WFLs CERVICAL AROM:     Percent limited 12/26/23  Flexion WNL  Extension 25% limited  Right lateral flexion 50% limited  Left lateral flexion 50% limited  Right rotation 25% limited   Left rotation 25 % limited      UPPER EXTREMITY STRENGTH:  Tested in neutral: 4+/5 Rt shoulder, against gravity: flexion 3-/5 with limited ROM, abduction 3/5, IR/ER 4/5     TREATMENT:  Date: 02/14/24:  Overhead pulleys: flexion x 3 min, abduction x2 min  Standing shoulder extension and flexion with red band 2x10 Finger ladder: flexion and abduction x15 ER with red band 2x10  Verbal review of all HEP Date: 01/24/24:  Overhead pulleys: flexion x 3 min, abduction x2 min  3 way raises: Rt x15 each UE ranger on wall: flexion x15, abduction 15 Standing shoulder extension and flexion with red band 2x10 Finger ladder: flexion and abduction x15 ER with red band 2x10  Trigger Point Dry Needling  Subsequent Treatment: Instructions reviewed, if requested by the patient, prior to subsequent dry needling treatment.  Patient Verbal Consent Given: Yes Education Handout Provided: Previously Provided Muscles Treated: Rt lateral deltoid and biceps   Electrical Stimulation Performed: No Treatment Response/Outcome: Utilized skilled palpation to identify bony landmarks and trigger points.  Able to illicit twitch response and muscle elongation.  Soft tissue mobilization to muscles needled following DN to further promote tissue elongation and decreased pain.      Date: 01/17/24:  Overhead pulleys: flexion x 3 min, abduction x2 min  Wall ladder: flexion 2x5, scaption 2x5- tactile and verbal cues for  UE ranger on wall: flexion x10 Standing shoulder extension and flexion with red band x10 Flexion and extension: Rt isometrics 5 x10 each ER with red band 2x10  Wall push-ups 2x10 Seated shoulder flexion x10 with emphasis on scapular depression    PATIENT EDUCATION:  Education details: Access Code: [email or web address] Person educated: Patient Education method: Explanation, Demonstration, and Verbal cues Education comprehension: verbalized understanding and returned demonstration  HOME EXERCISE PROGRAM: Access Code: [facility] URL: [email or web address] Date: 01/03/2024 Prepared by: Burnard  Exercises - Seated Shoulder Flexion Towel Slide at Table Top  - 3 x daily - 7 x weekly - 1 sets - 10 reps - 10-20 hold - Seated Shoulder Abduction Towel Slide at Table Top  - 3 x daily - 7 x weekly - 1 sets - 10 reps - 10-20 hold - Seated Neck Sidebending Stretch  - 3 x daily - 7 x weekly - 1 sets - 3 reps - 30 hold - Standing Shoulder External Rotation with Resistance  - 2 x daily - 7 x weekly - 2 sets - 10 reps - Standing Isometric Shoulder Extension with Doorway - Arm Bent  - 2 x  daily - 7 x weekly - 1-2 sets - 10 reps - 5 hold - Standing Isometric Shoulder Flexion with Doorway - Arm Bent  - 2 x daily - 7 x weekly - 1-2 sets - 10 reps   ASSESSMENT: ASSESSMENT:  CLINICAL IMPRESSION: QuickDASH is improved to 27.6% disability. Pt will D/C to HEP today. PT added to HEP for additional strength and she will continue to stretch for improved mobility.   Verbal review of all HEP today.    OBJECTIVE IMPAIRMENTS: decreased mobility, decreased strength, increased muscle spasms, and pain.   ACTIVITY LIMITATIONS: caring for others, reaching  PARTICIPATION LIMITATIONS: cleaning and laundry  PERSONAL FACTORS: Social background and Time since onset of injury/illness/exacerbation, Rt total shoulder replacement and radiation to region are also affecting patient's functional outcome.   REHAB POTENTIAL: Good  CLINICAL DECISION MAKING: Stable/uncomplicated  EVALUATION COMPLEXITY: Low  GOALS: Goals reviewed with patient? Yes  SHORT TERM GOALS: Target date: 01/23/2024     Be independent in initial HEP Baseline: working on being more consistent (01/17/24) Goal status: in progress   2.  Report > or = to 30% reduction in Rt shoulder/arm pain with functional reaching tasks  Baseline: 50% (01/17/24) Goal status: MET  3.  Verbalize and demonstrate postural corrections to reduce scapular muscle tension  Baseline: making corrections at home and during PT session (01/17/24) Goal status:  in progress    LONG TERM GOALS: Target date:02/20/2024  Be independent in advanced HEP Baseline: advanced today (02/14/24) Goal status: MET  2.  Improve QuickDASH to < or = to 40%  Baseline: 27.3% (02/14/24) Goal status: MET  3.  Report > or = to 60% reduction in Rt shoulder pain with reaching tasks  Baseline: 55% (02/14/24) Goal status: partially met  4.  Demonstrate Rt shoulder A/ROM flexion to 95 degrees to improve reaching overhead due to improved functional strength  Baseline: 85 (01/17/24) Goal status: partially met    PLAN: PHYSICAL THERAPY DISCHARGE SUMMARY  Visits from Start of Care: 6  Current functional level related to goals / functional outcomes: See above for current status.    Remaining deficits: Limited A/ROM for functional tasks and pt is working on this.    Education / Equipment: HEP   Patient agrees to discharge. Patient goals were  partially met. Patient is being discharged due to being pleased with the current functional level.   Burnard Joy, PT 12/19/23 10:18 AM

## 2024-02-20 ENCOUNTER — Ambulatory Visit

## 2024-02-21 DIAGNOSIS — F900 Attention-deficit hyperactivity disorder, predominantly inattentive type: Secondary | ICD-10-CM | POA: Diagnosis not present

## 2024-02-21 DIAGNOSIS — F330 Major depressive disorder, recurrent, mild: Secondary | ICD-10-CM | POA: Diagnosis not present

## 2024-02-21 DIAGNOSIS — F419 Anxiety disorder, unspecified: Secondary | ICD-10-CM | POA: Diagnosis not present

## 2024-02-28 DIAGNOSIS — G4733 Obstructive sleep apnea (adult) (pediatric): Secondary | ICD-10-CM | POA: Diagnosis not present

## 2024-02-28 DIAGNOSIS — I10 Essential (primary) hypertension: Secondary | ICD-10-CM | POA: Diagnosis not present

## 2024-02-28 DIAGNOSIS — E65 Localized adiposity: Secondary | ICD-10-CM | POA: Diagnosis not present

## 2024-02-28 DIAGNOSIS — Z6841 Body Mass Index (BMI) 40.0 and over, adult: Secondary | ICD-10-CM | POA: Diagnosis not present

## 2024-02-28 DIAGNOSIS — R7303 Prediabetes: Secondary | ICD-10-CM | POA: Diagnosis not present

## 2024-02-28 DIAGNOSIS — E66813 Obesity, class 3: Secondary | ICD-10-CM | POA: Diagnosis not present

## 2024-02-28 DIAGNOSIS — E559 Vitamin D deficiency, unspecified: Secondary | ICD-10-CM | POA: Diagnosis not present

## 2024-02-28 DIAGNOSIS — M8580 Other specified disorders of bone density and structure, unspecified site: Secondary | ICD-10-CM | POA: Diagnosis not present

## 2024-03-07 DIAGNOSIS — L905 Scar conditions and fibrosis of skin: Secondary | ICD-10-CM | POA: Diagnosis not present

## 2024-03-07 DIAGNOSIS — L564 Polymorphous light eruption: Secondary | ICD-10-CM | POA: Diagnosis not present

## 2024-03-12 ENCOUNTER — Ambulatory Visit

## 2024-03-12 VITALS — Wt 261.2 lb

## 2024-03-12 DIAGNOSIS — Z483 Aftercare following surgery for neoplasm: Secondary | ICD-10-CM

## 2024-03-12 NOTE — Therapy (Signed)
 OUTPATIENT PHYSICAL THERAPY SOZO SCREENING NOTE   Patient Name: [name] [name] MRN: [id number] DOB:11/10/1950, 73 y.o., female Today's Date: 03/12/2024  PCP: Alben Therisa MATSU, [place] REFERRING PROVIDER: Ebbie Cough, MD   PT End of Session - 03/12/24 1607     Visit Number 6   # unchanged due to screen only   PT Start Time 1605    PT Stop Time 1609    PT Time Calculation (min) 4 min    Activity Tolerance Patient tolerated treatment well    Behavior During Therapy WFL for tasks assessed/performed          Past Medical History:  Diagnosis Date   ADHD (attention deficit hyperactivity disorder)    Anemia    Anxiety    Arthritis    Depression    GERD (gastroesophageal reflux disease)    History of panic attacks    Hyperlipidemia    Hypertension    Hypothyroidism, postsurgical    Pre-diabetes    SBO (small bowel obstruction) (HCC) 01/15/2020   Sleep apnea    SUI (stress urinary incontinence, female)    Wears glasses    Past Surgical History:  Procedure Laterality Date   BREAST BIOPSY Right 10/07/2022   BREAST BIOPSY Right 10/07/2022   US  RT BREAST BX W LOC DEV 1ST LESION IMG BX SPEC US  GUIDE 10/07/2022 [facility]   BREAST BIOPSY  11/18/2022   MM RT RADIOACTIVE SEED LOC MAMMO GUIDE 11/18/2022 [facility]   BREAST LUMPECTOMY WITH RADIOACTIVE SEED AND SENTINEL LYMPH NODE BIOPSY Right 11/19/2022   Procedure: RIGHT BREAST LUMPECTOMY WITH RADIOACTIVE SEED;  Surgeon: Ebbie Cough, MD;  Location: [facility] OR;  Service: General;  Laterality: Right;   CATARACT EXTRACTION Bilateral    2023   D & C HYSTEROSCOPY W/ RESECTION ENDOMETRIAL POLYP  04/15/2009   D & C HYSTEROSCOPY W/ RESECTOSCOPE AND THERMACHOICE ENDOMETRIAL ABLATION  12/09/2010   EXCISION MELANOMA WITH SENTINEL LYMPH NODE BIOPSY Right 11/19/2022   Procedure: SENTINEL LYMPH NODE BIOPSY;  Surgeon: Ebbie Cough, MD;  Location: [facility] OR;  Service: General;  Laterality: Right;   LAPAROSCOPIC ASSISTED VENTRAL HERNIA  REPAIR Left 01/16/2020   Procedure: LAPAROSCOPIC ASSISTED HERNIA REPAIR;  Surgeon: Signe Mitzie LABOR, MD;  Location: [facility] OR;  Service: General;  Laterality: Left;   PUBOVAGINAL SLING N/A 07/26/2016   Procedure: PUBO-VAGINAL SLING ALTIS SLING;  Surgeon: Arlena Gal, MD;  Location: [facility];  Service: Urology;  Laterality: N/A;   REPAIR SPIGELIAN HERNIA     RESECTION GIANT CELL TUMOR RIGHT LONG FINGER  09/13/2008   REVERSE SHOULDER ARTHROPLASTY Right 07/16/2021   Procedure: REVERSE SHOULDER ARTHROPLASTY;  Surgeon: Dozier Soulier, MD;  Location: [facility];  Service: Orthopedics;  Laterality: Right;   ROTATOR CUFF REPAIR Bilateral    THYROIDECTOMY  1979    [facility]   non-malignant diseased thyroid    THYROIDECTOMY Left 03/17/2021   Procedure: COMPLETION THYROIDECTOMY, LEFT LOBE;  Surgeon: Eletha Boas, MD;  Location: [facility];  Service: General;  Laterality: Left;   TOTAL KNEE ARTHROPLASTY Right 12/17/2019   Procedure: RIGHT TOTAL KNEE ARTHROPLASTY;  Surgeon: Liam Lerner, MD;  Location: [facility];  Service: Orthopedics;  Laterality: Right;   TOTAL KNEE ARTHROPLASTY Left 03/03/2020   Procedure: LEFT TOTAL KNEE ARTHROPLASTY;  Surgeon: Liam Lerner, MD;  Location: [facility];  Service: Orthopedics;  Laterality: Left;   TRIPLE ARTHRODESIS LEFT FOOT  04/20/2007   VENTRAL HERNIA REPAIR  2003   WRIST GANGLION EXCISION Right 1975   Patient Active Problem  List   Diagnosis Date Noted   Genetic testing 11/01/2022   Family history of breast cancer 10/21/2022   Malignant neoplasm of lower-inner quadrant of right breast of female, estrogen receptor positive (HCC) 10/18/2022   Neoplasm of uncertain behavior of thyroid  gland 03/15/2021   Left thyroid  nodule 03/15/2021   S/P TKR (total knee replacement) using cement, left 03/03/2020   Degenerative arthritis of left knee 02/29/2020   SBO (small bowel obstruction) (HCC) 01/15/2020   S/P TKR (total knee replacement), right 12/19/2019   Total knee  replacement status, right 12/17/2019   Osteoarthritis of right knee 12/14/2019   Obesity, morbid (HCC) 12/16/2016   Strain of iliopsoas muscle, initial encounter 07/15/2016   Elevated cholesterol    Urinary incontinence    Endometrial polyp    Primary osteoarthritis of knees, bilateral 05/15/2009   DERANGEMENT OF ANTERIOR HORN OF LATERAL MENISCUS 05/15/2009   KNEE PAIN 05/15/2009    REFERRING DIAG: right breast cancer at risk for lymphedema  THERAPY DIAG: Aftercare following surgery for neoplasm  PERTINENT HISTORY: Patient was diagnosed on 09/21/2022 with right grade 2 invasive ductal carcinoma breast cancer. She underwent a right lumpectomy and sentinel node biopsy on 11/19/2022 but there as not nodal tissue in the sentinel node biopsy. Final pathology was ER/PR positive and HER2 negative with a Ki67 of 2%. Hx left rotator cuff repair 08/05/20. Pins placed in left foot 2003. Hx of right shoulder replacement 07/16/2021.   PRECAUTIONS: right UE Lymphedema risk, None  SUBJECTIVE: Pt returns for her 3 month L-Dex screen.   PAIN:  Are you having pain? No  SOZO SCREENING: Patient was assessed today using the SOZO machine to determine the lymphedema index score. This was compared to her baseline score. It was determined that she is within the recommended range when compared to her baseline and no further action is needed at this time. She will continue SOZO screenings. These are done every 3 months for 2 years post operatively followed by every 6 months for 2 years, and then annually.     L-DEX FLOWSHEETS - 03/12/24 1600       L-DEX LYMPHEDEMA SCREENING   Measurement Type Unilateral    L-DEX MEASUREMENT EXTREMITY Upper Extremity    POSITION  Standing    DOMINANT SIDE Right    At Risk Side Right    BASELINE SCORE (UNILATERAL) 3.7    L-DEX SCORE (UNILATERAL) -1    VALUE CHANGE (UNILAT) -4.7         P: Cont every 3 month L-Dex screens.    Aden Berwyn Caldron, PTA 03/12/2024,  4:09 PM

## 2024-03-29 DIAGNOSIS — G4733 Obstructive sleep apnea (adult) (pediatric): Secondary | ICD-10-CM | POA: Diagnosis not present

## 2024-04-05 ENCOUNTER — Ambulatory Visit: Admitting: Sports Medicine

## 2024-04-11 DIAGNOSIS — E66813 Obesity, class 3: Secondary | ICD-10-CM | POA: Diagnosis not present

## 2024-04-11 DIAGNOSIS — Z6841 Body Mass Index (BMI) 40.0 and over, adult: Secondary | ICD-10-CM | POA: Diagnosis not present

## 2024-04-11 DIAGNOSIS — R7303 Prediabetes: Secondary | ICD-10-CM | POA: Diagnosis not present

## 2024-04-11 DIAGNOSIS — E65 Localized adiposity: Secondary | ICD-10-CM | POA: Diagnosis not present

## 2024-04-11 DIAGNOSIS — G4733 Obstructive sleep apnea (adult) (pediatric): Secondary | ICD-10-CM | POA: Diagnosis not present

## 2024-04-11 DIAGNOSIS — I10 Essential (primary) hypertension: Secondary | ICD-10-CM | POA: Diagnosis not present

## 2024-04-11 DIAGNOSIS — E559 Vitamin D deficiency, unspecified: Secondary | ICD-10-CM | POA: Diagnosis not present

## 2024-04-11 DIAGNOSIS — M8580 Other specified disorders of bone density and structure, unspecified site: Secondary | ICD-10-CM | POA: Diagnosis not present

## 2024-04-13 DIAGNOSIS — G4733 Obstructive sleep apnea (adult) (pediatric): Secondary | ICD-10-CM | POA: Diagnosis not present

## 2024-04-13 DIAGNOSIS — R400 Somnolence: Secondary | ICD-10-CM | POA: Diagnosis not present

## 2024-04-19 DIAGNOSIS — G8929 Other chronic pain: Secondary | ICD-10-CM | POA: Diagnosis not present

## 2024-04-19 DIAGNOSIS — M2142 Flat foot [pes planus] (acquired), left foot: Secondary | ICD-10-CM | POA: Diagnosis not present

## 2024-04-19 DIAGNOSIS — M19072 Primary osteoarthritis, left ankle and foot: Secondary | ICD-10-CM | POA: Diagnosis not present

## 2024-04-19 DIAGNOSIS — Q666 Other congenital valgus deformities of feet: Secondary | ICD-10-CM | POA: Diagnosis not present

## 2024-04-19 DIAGNOSIS — M25572 Pain in left ankle and joints of left foot: Secondary | ICD-10-CM | POA: Diagnosis not present

## 2024-05-14 DIAGNOSIS — M21062 Valgus deformity, not elsewhere classified, left knee: Secondary | ICD-10-CM | POA: Diagnosis not present

## 2024-05-14 DIAGNOSIS — Z96653 Presence of artificial knee joint, bilateral: Secondary | ICD-10-CM | POA: Diagnosis not present

## 2024-05-14 DIAGNOSIS — Q666 Other congenital valgus deformities of feet: Secondary | ICD-10-CM | POA: Diagnosis not present

## 2024-05-14 DIAGNOSIS — M19072 Primary osteoarthritis, left ankle and foot: Secondary | ICD-10-CM | POA: Diagnosis not present

## 2024-05-14 DIAGNOSIS — M19171 Post-traumatic osteoarthritis, right ankle and foot: Secondary | ICD-10-CM | POA: Diagnosis not present

## 2024-05-14 DIAGNOSIS — M2141 Flat foot [pes planus] (acquired), right foot: Secondary | ICD-10-CM | POA: Diagnosis not present

## 2024-05-14 DIAGNOSIS — M2142 Flat foot [pes planus] (acquired), left foot: Secondary | ICD-10-CM | POA: Diagnosis not present

## 2024-05-14 DIAGNOSIS — Z981 Arthrodesis status: Secondary | ICD-10-CM | POA: Diagnosis not present

## 2024-05-14 DIAGNOSIS — Z01818 Encounter for other preprocedural examination: Secondary | ICD-10-CM | POA: Diagnosis not present

## 2024-05-14 DIAGNOSIS — M2012 Hallux valgus (acquired), left foot: Secondary | ICD-10-CM | POA: Diagnosis not present

## 2024-05-14 DIAGNOSIS — M21061 Valgus deformity, not elsewhere classified, right knee: Secondary | ICD-10-CM | POA: Diagnosis not present

## 2024-05-14 DIAGNOSIS — M7661 Achilles tendinitis, right leg: Secondary | ICD-10-CM | POA: Diagnosis not present

## 2024-05-14 DIAGNOSIS — T8484XA Pain due to internal orthopedic prosthetic devices, implants and grafts, initial encounter: Secondary | ICD-10-CM | POA: Diagnosis not present

## 2024-05-17 DIAGNOSIS — M8580 Other specified disorders of bone density and structure, unspecified site: Secondary | ICD-10-CM | POA: Diagnosis not present

## 2024-05-17 DIAGNOSIS — I10 Essential (primary) hypertension: Secondary | ICD-10-CM | POA: Diagnosis not present

## 2024-05-17 DIAGNOSIS — G4733 Obstructive sleep apnea (adult) (pediatric): Secondary | ICD-10-CM | POA: Diagnosis not present

## 2024-05-17 DIAGNOSIS — E66813 Obesity, class 3: Secondary | ICD-10-CM | POA: Diagnosis not present

## 2024-05-17 DIAGNOSIS — Z6841 Body Mass Index (BMI) 40.0 and over, adult: Secondary | ICD-10-CM | POA: Diagnosis not present

## 2024-05-17 DIAGNOSIS — E65 Localized adiposity: Secondary | ICD-10-CM | POA: Diagnosis not present

## 2024-05-17 DIAGNOSIS — E559 Vitamin D deficiency, unspecified: Secondary | ICD-10-CM | POA: Diagnosis not present

## 2024-05-17 DIAGNOSIS — R7303 Prediabetes: Secondary | ICD-10-CM | POA: Diagnosis not present

## 2024-05-28 DIAGNOSIS — F900 Attention-deficit hyperactivity disorder, predominantly inattentive type: Secondary | ICD-10-CM | POA: Diagnosis not present

## 2024-05-28 DIAGNOSIS — F419 Anxiety disorder, unspecified: Secondary | ICD-10-CM | POA: Diagnosis not present

## 2024-05-28 DIAGNOSIS — F330 Major depressive disorder, recurrent, mild: Secondary | ICD-10-CM | POA: Diagnosis not present

## 2024-05-31 DIAGNOSIS — M25372 Other instability, left ankle: Secondary | ICD-10-CM | POA: Diagnosis not present

## 2024-06-11 ENCOUNTER — Ambulatory Visit: Attending: General Surgery

## 2024-06-11 VITALS — Wt 254.5 lb

## 2024-06-11 DIAGNOSIS — Z483 Aftercare following surgery for neoplasm: Secondary | ICD-10-CM | POA: Insufficient documentation

## 2024-06-11 NOTE — Therapy (Signed)
 OUTPATIENT PHYSICAL THERAPY SOZO SCREENING NOTE   Patient Name: [name] [name] MRN: [id number] DOB:May 22, 1951, 73 y.o., female Today's Date: 06/11/2024  PCP: Alben Therisa MATSU, [place] REFERRING PROVIDER: Ebbie Cough, MD   PT End of Session - 06/11/24 1621     Visit Number 6   # unchanged due to screen only   PT Start Time 1618    PT Stop Time 1626    PT Time Calculation (min) 8 min    Activity Tolerance Patient tolerated treatment well    Behavior During Therapy WFL for tasks assessed/performed          Past Medical History:  Diagnosis Date   ADHD (attention deficit hyperactivity disorder)    Anemia    Anxiety    Arthritis    Depression    GERD (gastroesophageal reflux disease)    History of panic attacks    Hyperlipidemia    Hypertension    Hypothyroidism, postsurgical    Pre-diabetes    SBO (small bowel obstruction) (HCC) 01/15/2020   Sleep apnea    SUI (stress urinary incontinence, female)    Wears glasses    Past Surgical History:  Procedure Laterality Date   BREAST BIOPSY Right 10/07/2022   BREAST BIOPSY Right 10/07/2022   US  RT BREAST BX W LOC DEV 1ST LESION IMG BX SPEC US  GUIDE 10/07/2022 [facility]   BREAST BIOPSY  11/18/2022   MM RT RADIOACTIVE SEED LOC MAMMO GUIDE 11/18/2022 [facility]   BREAST LUMPECTOMY WITH RADIOACTIVE SEED AND SENTINEL LYMPH NODE BIOPSY Right 11/19/2022   Procedure: RIGHT BREAST LUMPECTOMY WITH RADIOACTIVE SEED;  Surgeon: Ebbie Cough, MD;  Location: [facility] OR;  Service: General;  Laterality: Right;   CATARACT EXTRACTION Bilateral    2023   D & C HYSTEROSCOPY W/ RESECTION ENDOMETRIAL POLYP  04/15/2009   D & C HYSTEROSCOPY W/ RESECTOSCOPE AND THERMACHOICE ENDOMETRIAL ABLATION  12/09/2010   EXCISION MELANOMA WITH SENTINEL LYMPH NODE BIOPSY Right 11/19/2022   Procedure: SENTINEL LYMPH NODE BIOPSY;  Surgeon: Ebbie Cough, MD;  Location: [facility] OR;  Service: General;  Laterality: Right;   LAPAROSCOPIC ASSISTED VENTRAL HERNIA  REPAIR Left 01/16/2020   Procedure: LAPAROSCOPIC ASSISTED HERNIA REPAIR;  Surgeon: Signe Mitzie LABOR, MD;  Location: [facility] OR;  Service: General;  Laterality: Left;   PUBOVAGINAL SLING N/A 07/26/2016   Procedure: PUBO-VAGINAL SLING ALTIS SLING;  Surgeon: Arlena Gal, MD;  Location: [facility];  Service: Urology;  Laterality: N/A;   REPAIR SPIGELIAN HERNIA     RESECTION GIANT CELL TUMOR RIGHT LONG FINGER  09/13/2008   REVERSE SHOULDER ARTHROPLASTY Right 07/16/2021   Procedure: REVERSE SHOULDER ARTHROPLASTY;  Surgeon: Dozier Soulier, MD;  Location: [facility];  Service: Orthopedics;  Laterality: Right;   ROTATOR CUFF REPAIR Bilateral    THYROIDECTOMY  1979    [facility]   non-malignant diseased thyroid    THYROIDECTOMY Left 03/17/2021   Procedure: COMPLETION THYROIDECTOMY, LEFT LOBE;  Surgeon: Eletha Boas, MD;  Location: [facility];  Service: General;  Laterality: Left;   TOTAL KNEE ARTHROPLASTY Right 12/17/2019   Procedure: RIGHT TOTAL KNEE ARTHROPLASTY;  Surgeon: Liam Lerner, MD;  Location: [facility];  Service: Orthopedics;  Laterality: Right;   TOTAL KNEE ARTHROPLASTY Left 03/03/2020   Procedure: LEFT TOTAL KNEE ARTHROPLASTY;  Surgeon: Liam Lerner, MD;  Location: [facility];  Service: Orthopedics;  Laterality: Left;   TRIPLE ARTHRODESIS LEFT FOOT  04/20/2007   VENTRAL HERNIA REPAIR  2003   WRIST GANGLION EXCISION Right 1975   Patient Active Problem  List   Diagnosis Date Noted   Genetic testing 11/01/2022   Family history of breast cancer 10/21/2022   Malignant neoplasm of lower-inner quadrant of right breast of female, estrogen receptor positive (HCC) 10/18/2022   Neoplasm of uncertain behavior of thyroid  gland 03/15/2021   Left thyroid  nodule 03/15/2021   S/P TKR (total knee replacement) using cement, left 03/03/2020   Degenerative arthritis of left knee 02/29/2020   SBO (small bowel obstruction) (HCC) 01/15/2020   S/P TKR (total knee replacement), right 12/19/2019   Total knee  replacement status, right 12/17/2019   Osteoarthritis of right knee 12/14/2019   Obesity, morbid (HCC) 12/16/2016   Strain of iliopsoas muscle, initial encounter 07/15/2016   Elevated cholesterol    Urinary incontinence    Endometrial polyp    Primary osteoarthritis of knees, bilateral 05/15/2009   DERANGEMENT OF ANTERIOR HORN OF LATERAL MENISCUS 05/15/2009   KNEE PAIN 05/15/2009    REFERRING DIAG: right breast cancer at risk for lymphedema  THERAPY DIAG: Aftercare following surgery for neoplasm  PERTINENT HISTORY: Patient was diagnosed on 09/21/2022 with right grade 2 invasive ductal carcinoma breast cancer. She underwent a right lumpectomy and sentinel node biopsy on 11/19/2022 but there as not nodal tissue in the sentinel node biopsy. Final pathology was ER/PR positive and HER2 negative with a Ki67 of 2%. Hx left rotator cuff repair 08/05/20. Pins placed in left foot 2003. Hx of right shoulder replacement 07/16/2021.   PRECAUTIONS: right UE Lymphedema risk, None  SUBJECTIVE: Pt returns for her 3 month L-Dex screen.   PAIN:  Are you having pain? No  SOZO SCREENING: Patient was assessed today using the SOZO machine to determine the lymphedema index score. This was compared to her baseline score. It was determined that she is within the recommended range when compared to her baseline and no further action is needed at this time. She will continue SOZO screenings. These are done every 3 months for 2 years post operatively followed by every 6 months for 2 years, and then annually.     L-DEX FLOWSHEETS - 06/11/24 1600       L-DEX LYMPHEDEMA SCREENING   Measurement Type Unilateral    L-DEX MEASUREMENT EXTREMITY Upper Extremity    POSITION  Standing    DOMINANT SIDE Right    At Risk Side Right    BASELINE SCORE (UNILATERAL) 3.7    L-DEX SCORE (UNILATERAL) -1.3    VALUE CHANGE (UNILAT) -5         P: Cont every 3 month L-Dex screens until 11/2024, and then transition to every 6  months until 11/2026.    Aden Berwyn Caldron, PTA 06/11/2024, 4:26 PM

## 2024-06-14 DIAGNOSIS — F330 Major depressive disorder, recurrent, mild: Secondary | ICD-10-CM | POA: Diagnosis not present

## 2024-06-14 DIAGNOSIS — F419 Anxiety disorder, unspecified: Secondary | ICD-10-CM | POA: Diagnosis not present

## 2024-06-22 DIAGNOSIS — Z23 Encounter for immunization: Secondary | ICD-10-CM | POA: Diagnosis not present

## 2024-06-22 DIAGNOSIS — R5383 Other fatigue: Secondary | ICD-10-CM | POA: Diagnosis not present

## 2024-06-22 DIAGNOSIS — E7800 Pure hypercholesterolemia, unspecified: Secondary | ICD-10-CM | POA: Diagnosis not present

## 2024-06-22 DIAGNOSIS — E039 Hypothyroidism, unspecified: Secondary | ICD-10-CM | POA: Diagnosis not present

## 2024-06-22 DIAGNOSIS — R7303 Prediabetes: Secondary | ICD-10-CM | POA: Diagnosis not present

## 2024-06-22 DIAGNOSIS — I10 Essential (primary) hypertension: Secondary | ICD-10-CM | POA: Diagnosis not present

## 2024-06-28 DIAGNOSIS — F330 Major depressive disorder, recurrent, mild: Secondary | ICD-10-CM | POA: Diagnosis not present

## 2024-06-28 DIAGNOSIS — F4312 Post-traumatic stress disorder, chronic: Secondary | ICD-10-CM | POA: Diagnosis not present

## 2024-06-28 DIAGNOSIS — F419 Anxiety disorder, unspecified: Secondary | ICD-10-CM | POA: Diagnosis not present

## 2024-07-02 DIAGNOSIS — I10 Essential (primary) hypertension: Secondary | ICD-10-CM | POA: Diagnosis not present

## 2024-07-02 DIAGNOSIS — R7303 Prediabetes: Secondary | ICD-10-CM | POA: Diagnosis not present

## 2024-07-02 DIAGNOSIS — M8580 Other specified disorders of bone density and structure, unspecified site: Secondary | ICD-10-CM | POA: Diagnosis not present

## 2024-07-02 DIAGNOSIS — E66813 Obesity, class 3: Secondary | ICD-10-CM | POA: Diagnosis not present

## 2024-07-02 DIAGNOSIS — E65 Localized adiposity: Secondary | ICD-10-CM | POA: Diagnosis not present

## 2024-07-02 DIAGNOSIS — E559 Vitamin D deficiency, unspecified: Secondary | ICD-10-CM | POA: Diagnosis not present

## 2024-07-02 DIAGNOSIS — Z6841 Body Mass Index (BMI) 40.0 and over, adult: Secondary | ICD-10-CM | POA: Diagnosis not present

## 2024-07-02 DIAGNOSIS — G4733 Obstructive sleep apnea (adult) (pediatric): Secondary | ICD-10-CM | POA: Diagnosis not present

## 2024-07-04 DIAGNOSIS — E66813 Obesity, class 3: Secondary | ICD-10-CM | POA: Diagnosis not present

## 2024-07-04 DIAGNOSIS — I10 Essential (primary) hypertension: Secondary | ICD-10-CM | POA: Diagnosis not present

## 2024-07-04 DIAGNOSIS — G4700 Insomnia, unspecified: Secondary | ICD-10-CM | POA: Diagnosis not present

## 2024-07-04 DIAGNOSIS — Z6841 Body Mass Index (BMI) 40.0 and over, adult: Secondary | ICD-10-CM | POA: Diagnosis not present

## 2024-07-04 DIAGNOSIS — G4733 Obstructive sleep apnea (adult) (pediatric): Secondary | ICD-10-CM | POA: Diagnosis not present

## 2024-07-24 DIAGNOSIS — M25372 Other instability, left ankle: Secondary | ICD-10-CM | POA: Diagnosis not present

## 2024-07-24 DIAGNOSIS — M21079 Valgus deformity, not elsewhere classified, unspecified ankle: Secondary | ICD-10-CM | POA: Diagnosis not present

## 2024-07-24 DIAGNOSIS — M19072 Primary osteoarthritis, left ankle and foot: Secondary | ICD-10-CM | POA: Diagnosis not present

## 2024-07-24 DIAGNOSIS — T8484XA Pain due to internal orthopedic prosthetic devices, implants and grafts, initial encounter: Secondary | ICD-10-CM | POA: Diagnosis not present

## 2024-07-24 DIAGNOSIS — Q666 Other congenital valgus deformities of feet: Secondary | ICD-10-CM | POA: Diagnosis not present

## 2024-07-24 DIAGNOSIS — E559 Vitamin D deficiency, unspecified: Secondary | ICD-10-CM | POA: Diagnosis not present

## 2024-07-26 DIAGNOSIS — F4312 Post-traumatic stress disorder, chronic: Secondary | ICD-10-CM | POA: Diagnosis not present

## 2024-07-26 DIAGNOSIS — F419 Anxiety disorder, unspecified: Secondary | ICD-10-CM | POA: Diagnosis not present

## 2024-07-26 DIAGNOSIS — F330 Major depressive disorder, recurrent, mild: Secondary | ICD-10-CM | POA: Diagnosis not present

## 2024-09-10 ENCOUNTER — Ambulatory Visit

## 2024-09-17 ENCOUNTER — Ambulatory Visit

## 2024-10-23 ENCOUNTER — Ambulatory Visit: Admitting: Hematology and Oncology
